# Patient Record
Sex: Male | Born: 1970
Health system: Southern US, Community
[De-identification: ages and names within clinical notes are randomized; demographics above are authoritative.]

## PROBLEM LIST (undated history)

## (undated) DIAGNOSIS — N186 End stage renal disease: Secondary | ICD-10-CM

## (undated) DIAGNOSIS — J449 Chronic obstructive pulmonary disease, unspecified: Secondary | ICD-10-CM

## (undated) DIAGNOSIS — I639 Cerebral infarction, unspecified: Secondary | ICD-10-CM

## (undated) DIAGNOSIS — F419 Anxiety disorder, unspecified: Secondary | ICD-10-CM

## (undated) DIAGNOSIS — E119 Type 2 diabetes mellitus without complications: Secondary | ICD-10-CM

## (undated) DIAGNOSIS — J189 Pneumonia, unspecified organism: Secondary | ICD-10-CM

## (undated) DIAGNOSIS — T8859XA Other complications of anesthesia, initial encounter: Secondary | ICD-10-CM

## (undated) DIAGNOSIS — I517 Cardiomegaly: Secondary | ICD-10-CM

## (undated) DIAGNOSIS — R0600 Dyspnea, unspecified: Secondary | ICD-10-CM

## (undated) DIAGNOSIS — G4730 Sleep apnea, unspecified: Secondary | ICD-10-CM

## (undated) DIAGNOSIS — N400 Enlarged prostate without lower urinary tract symptoms: Secondary | ICD-10-CM

## (undated) DIAGNOSIS — I2510 Atherosclerotic heart disease of native coronary artery without angina pectoris: Secondary | ICD-10-CM

## (undated) DIAGNOSIS — F1490 Cocaine use, unspecified, uncomplicated: Secondary | ICD-10-CM

## (undated) DIAGNOSIS — I10 Essential (primary) hypertension: Secondary | ICD-10-CM

## (undated) DIAGNOSIS — M5430 Sciatica, unspecified side: Secondary | ICD-10-CM

## (undated) DIAGNOSIS — K824 Cholesterolosis of gallbladder: Secondary | ICD-10-CM

## (undated) DIAGNOSIS — Z8739 Personal history of other diseases of the musculoskeletal system and connective tissue: Secondary | ICD-10-CM

## (undated) DIAGNOSIS — I7121 Aneurysm of the ascending aorta, without rupture: Secondary | ICD-10-CM

## (undated) DIAGNOSIS — I214 Non-ST elevation (NSTEMI) myocardial infarction: Secondary | ICD-10-CM

## (undated) DIAGNOSIS — E785 Hyperlipidemia, unspecified: Secondary | ICD-10-CM

## (undated) HISTORY — DX: End stage renal disease: N18.6

## (undated) HISTORY — DX: Benign prostatic hyperplasia without lower urinary tract symptoms: N40.0

## (undated) HISTORY — PX: NO PAST SURGERIES: SHX2092

## (undated) HISTORY — DX: Sciatica, unspecified side: M54.30

## (undated) HISTORY — DX: Personal history of other diseases of the musculoskeletal system and connective tissue: Z87.39

## (undated) HISTORY — DX: Non-ST elevation (NSTEMI) myocardial infarction: I21.4

## (undated) HISTORY — DX: Hyperlipidemia, unspecified: E78.5

## (undated) HISTORY — DX: Cocaine use, unspecified, uncomplicated: F14.90

## (undated) HISTORY — PX: CARDIAC CATHETERIZATION: SHX172

## (undated) HISTORY — DX: Cardiomegaly: I51.7

---

## 2005-08-04 ENCOUNTER — Emergency Department (HOSPITAL_COMMUNITY): Admission: EM | Admit: 2005-08-04 | Discharge: 2005-08-04 | Payer: Self-pay | Admitting: Emergency Medicine

## 2015-03-23 DIAGNOSIS — I428 Other cardiomyopathies: Secondary | ICD-10-CM | POA: Insufficient documentation

## 2016-02-15 DIAGNOSIS — I119 Hypertensive heart disease without heart failure: Secondary | ICD-10-CM | POA: Insufficient documentation

## 2016-02-15 DIAGNOSIS — I5042 Chronic combined systolic (congestive) and diastolic (congestive) heart failure: Secondary | ICD-10-CM | POA: Insufficient documentation

## 2018-01-02 ENCOUNTER — Other Ambulatory Visit: Payer: Self-pay

## 2018-01-02 ENCOUNTER — Inpatient Hospital Stay (HOSPITAL_COMMUNITY)
Admission: EM | Admit: 2018-01-02 | Discharge: 2018-01-04 | DRG: 190 | Disposition: A | Discharge status: Home / Self Care | Payer: Self-pay | Attending: Internal Medicine | Admitting: Internal Medicine

## 2018-01-02 ENCOUNTER — Encounter (HOSPITAL_COMMUNITY): Payer: Self-pay

## 2018-01-02 ENCOUNTER — Emergency Department (HOSPITAL_COMMUNITY): Payer: Self-pay

## 2018-01-02 DIAGNOSIS — R0602 Shortness of breath: Secondary | ICD-10-CM

## 2018-01-02 DIAGNOSIS — R9431 Abnormal electrocardiogram [ECG] [EKG]: Secondary | ICD-10-CM | POA: Diagnosis present

## 2018-01-02 DIAGNOSIS — I429 Cardiomyopathy, unspecified: Secondary | ICD-10-CM | POA: Diagnosis present

## 2018-01-02 DIAGNOSIS — Z7902 Long term (current) use of antithrombotics/antiplatelets: Secondary | ICD-10-CM

## 2018-01-02 DIAGNOSIS — I130 Hypertensive heart and chronic kidney disease with heart failure and stage 1 through stage 4 chronic kidney disease, or unspecified chronic kidney disease: Secondary | ICD-10-CM | POA: Diagnosis present

## 2018-01-02 DIAGNOSIS — J9600 Acute respiratory failure, unspecified whether with hypoxia or hypercapnia: Secondary | ICD-10-CM | POA: Insufficient documentation

## 2018-01-02 DIAGNOSIS — Z720 Tobacco use: Secondary | ICD-10-CM | POA: Diagnosis present

## 2018-01-02 DIAGNOSIS — Z79899 Other long term (current) drug therapy: Secondary | ICD-10-CM

## 2018-01-02 DIAGNOSIS — E1165 Type 2 diabetes mellitus with hyperglycemia: Secondary | ICD-10-CM | POA: Diagnosis present

## 2018-01-02 DIAGNOSIS — R042 Hemoptysis: Secondary | ICD-10-CM

## 2018-01-02 DIAGNOSIS — Z8249 Family history of ischemic heart disease and other diseases of the circulatory system: Secondary | ICD-10-CM

## 2018-01-02 DIAGNOSIS — Z9119 Patient's noncompliance with other medical treatment and regimen: Secondary | ICD-10-CM

## 2018-01-02 DIAGNOSIS — E785 Hyperlipidemia, unspecified: Secondary | ICD-10-CM

## 2018-01-02 DIAGNOSIS — E876 Hypokalemia: Secondary | ICD-10-CM | POA: Diagnosis not present

## 2018-01-02 DIAGNOSIS — N186 End stage renal disease: Secondary | ICD-10-CM

## 2018-01-02 DIAGNOSIS — E1169 Type 2 diabetes mellitus with other specified complication: Secondary | ICD-10-CM

## 2018-01-02 DIAGNOSIS — M79606 Pain in leg, unspecified: Secondary | ICD-10-CM | POA: Diagnosis present

## 2018-01-02 DIAGNOSIS — I10 Essential (primary) hypertension: Secondary | ICD-10-CM | POA: Insufficient documentation

## 2018-01-02 DIAGNOSIS — J209 Acute bronchitis, unspecified: Secondary | ICD-10-CM | POA: Diagnosis present

## 2018-01-02 DIAGNOSIS — I152 Hypertension secondary to endocrine disorders: Secondary | ICD-10-CM | POA: Insufficient documentation

## 2018-01-02 DIAGNOSIS — Z833 Family history of diabetes mellitus: Secondary | ICD-10-CM

## 2018-01-02 DIAGNOSIS — F17210 Nicotine dependence, cigarettes, uncomplicated: Secondary | ICD-10-CM | POA: Diagnosis present

## 2018-01-02 DIAGNOSIS — Z7984 Long term (current) use of oral hypoglycemic drugs: Secondary | ICD-10-CM

## 2018-01-02 DIAGNOSIS — E119 Type 2 diabetes mellitus without complications: Secondary | ICD-10-CM

## 2018-01-02 DIAGNOSIS — I159 Secondary hypertension, unspecified: Secondary | ICD-10-CM | POA: Diagnosis present

## 2018-01-02 DIAGNOSIS — N183 Chronic kidney disease, stage 3 unspecified: Secondary | ICD-10-CM

## 2018-01-02 DIAGNOSIS — Z992 Dependence on renal dialysis: Secondary | ICD-10-CM

## 2018-01-02 DIAGNOSIS — J9601 Acute respiratory failure with hypoxia: Secondary | ICD-10-CM | POA: Diagnosis present

## 2018-01-02 DIAGNOSIS — I272 Pulmonary hypertension, unspecified: Secondary | ICD-10-CM | POA: Diagnosis present

## 2018-01-02 DIAGNOSIS — J440 Chronic obstructive pulmonary disease with acute lower respiratory infection: Principal | ICD-10-CM | POA: Diagnosis present

## 2018-01-02 DIAGNOSIS — I160 Hypertensive urgency: Secondary | ICD-10-CM | POA: Diagnosis present

## 2018-01-02 DIAGNOSIS — R000 Tachycardia, unspecified: Secondary | ICD-10-CM | POA: Diagnosis present

## 2018-01-02 DIAGNOSIS — I5043 Acute on chronic combined systolic (congestive) and diastolic (congestive) heart failure: Secondary | ICD-10-CM | POA: Diagnosis present

## 2018-01-02 HISTORY — DX: Cholesterolosis of gallbladder: K82.4

## 2018-01-02 HISTORY — DX: Essential (primary) hypertension: I10

## 2018-01-02 HISTORY — DX: Type 2 diabetes mellitus without complications: E11.9

## 2018-01-02 LAB — COMPREHENSIVE METABOLIC PANEL
ALT: 26 U/L (ref 0–44)
AST: 23 U/L (ref 15–41)
Albumin: 3.7 g/dL (ref 3.5–5.0)
Alkaline Phosphatase: 62 U/L (ref 38–126)
Anion gap: 10 (ref 5–15)
BUN: 20 mg/dL (ref 6–20)
CO2: 27 mmol/L (ref 22–32)
Calcium: 8.6 mg/dL — ABNORMAL LOW (ref 8.9–10.3)
Chloride: 105 mmol/L (ref 98–111)
Creatinine, Ser: 1.45 mg/dL — ABNORMAL HIGH (ref 0.61–1.24)
GFR calc Af Amer: >60 mL/min (ref >60)
GFR calc non Af Amer: 56 mL/min — ABNORMAL LOW (ref >60)
Glucose, Bld: 186 mg/dL — ABNORMAL HIGH (ref 70–99)
Potassium: 3.1 mmol/L — ABNORMAL LOW (ref 3.5–5.1)
Sodium: 142 mmol/L (ref 135–145)
Total Bilirubin: 1.3 mg/dL — ABNORMAL HIGH (ref 0.3–1.2)
Total Protein: 7 g/dL (ref 6.5–8.1)

## 2018-01-02 LAB — HEMOGLOBIN A1C
Hgb A1c MFr Bld: 10.1 % — ABNORMAL HIGH (ref 4.8–5.6)
Mean Plasma Glucose: 243.17 mg/dL

## 2018-01-02 LAB — CBC WITH DIFFERENTIAL/PLATELET
Basophils Absolute: 0 10*3/uL (ref 0.0–0.1)
Basophils Relative: 0 %
Eosinophils Absolute: 0 10*3/uL (ref 0.0–0.7)
Eosinophils Relative: 1 %
HCT: 48 % (ref 39.0–52.0)
Hemoglobin: 16.4 g/dL (ref 13.0–17.0)
Lymphocytes Relative: 13 %
Lymphs Abs: 1 10*3/uL (ref 0.7–4.0)
MCH: 28.9 pg (ref 26.0–34.0)
MCHC: 34.2 g/dL (ref 30.0–36.0)
MCV: 84.5 fL (ref 78.0–100.0)
Monocytes Absolute: 0.8 10*3/uL (ref 0.1–1.0)
Monocytes Relative: 11 %
Neutro Abs: 5.8 10*3/uL (ref 1.7–7.7)
Neutrophils Relative %: 75 %
Platelets: 226 10*3/uL (ref 150–400)
RBC: 5.68 MIL/uL (ref 4.22–5.81)
RDW: 14.5 % (ref 11.5–15.5)
WBC: 7.7 10*3/uL (ref 4.0–10.5)

## 2018-01-02 LAB — I-STAT TROPONIN, ED: Troponin i, poc: 0.08 ng/mL (ref 0.00–0.08)

## 2018-01-02 LAB — TROPONIN I
Troponin I: 0.1 ng/mL (ref <0.03)
Troponin I: 0.11 ng/mL (ref <0.03)

## 2018-01-02 LAB — GLUCOSE, CAPILLARY
Glucose-Capillary: 190 mg/dL — ABNORMAL HIGH (ref 70–99)
Glucose-Capillary: 261 mg/dL — ABNORMAL HIGH (ref 70–99)

## 2018-01-02 LAB — BRAIN NATRIURETIC PEPTIDE: B Natriuretic Peptide: 184.7 pg/mL — ABNORMAL HIGH (ref 0.0–100.0)

## 2018-01-02 LAB — D-DIMER, QUANTITATIVE: D-Dimer, Quant: 0.44 ug/mL-FEU (ref 0.00–0.50)

## 2018-01-02 MED ORDER — SODIUM CHLORIDE 0.9 % IV SOLN
250.0000 mL | INTRAVENOUS | Status: DC | PRN
  Administered 2018-01-03 (×2): 250 mL via INTRAVENOUS

## 2018-01-02 MED ORDER — BENZONATATE 100 MG PO CAPS
100.0000 mg | ORAL_CAPSULE | Freq: Three times a day (TID) | ORAL | Status: DC
  Administered 2018-01-02 – 2018-01-04 (×6): 100 mg via ORAL
  Filled 2018-01-02 (×6): qty 1

## 2018-01-02 MED ORDER — IPRATROPIUM-ALBUTEROL 0.5-2.5 (3) MG/3ML IN SOLN
3.0000 mL | Freq: Once | RESPIRATORY_TRACT | Status: AC
  Administered 2018-01-02: 3 mL via RESPIRATORY_TRACT
  Filled 2018-01-02: qty 3

## 2018-01-02 MED ORDER — LOSARTAN POTASSIUM-HCTZ 50-12.5 MG PO TABS: 1.0000 | ORAL_TABLET | Freq: Every day | ORAL | Status: DC

## 2018-01-02 MED ORDER — IPRATROPIUM-ALBUTEROL 0.5-2.5 (3) MG/3ML IN SOLN
3.0000 mL | Freq: Four times a day (QID) | RESPIRATORY_TRACT | Status: DC
  Administered 2018-01-02 – 2018-01-03 (×3): 3 mL via RESPIRATORY_TRACT
  Filled 2018-01-02 (×3): qty 3

## 2018-01-02 MED ORDER — ASPIRIN 325 MG PO TABS: 325.0000 mg | ORAL_TABLET | Freq: Every day | ORAL | Status: DC

## 2018-01-02 MED ORDER — HYDRALAZINE HCL 20 MG/ML IJ SOLN
10.0000 mg | Freq: Four times a day (QID) | INTRAMUSCULAR | Status: DC | PRN
  Administered 2018-01-02 – 2018-01-04 (×5): 10 mg via INTRAVENOUS
  Filled 2018-01-02 (×5): qty 1

## 2018-01-02 MED ORDER — MORPHINE SULFATE (PF) 2 MG/ML IV SOLN
2.0000 mg | INTRAVENOUS | Status: AC | PRN
  Administered 2018-01-02 – 2018-01-03 (×2): 2 mg via INTRAVENOUS
  Filled 2018-01-02 (×2): qty 1

## 2018-01-02 MED ORDER — ASPIRIN 81 MG PO CHEW
324.0000 mg | CHEWABLE_TABLET | Freq: Once | ORAL | Status: AC
  Administered 2018-01-02: 324 mg via ORAL
  Filled 2018-01-02: qty 4

## 2018-01-02 MED ORDER — LORAZEPAM 0.5 MG PO TABS: 0.5000 mg | ORAL_TABLET | Freq: Once | ORAL | Status: DC

## 2018-01-02 MED ORDER — AMLODIPINE BESYLATE 5 MG PO TABS
10.0000 mg | ORAL_TABLET | Freq: Every day | ORAL | Status: DC
  Administered 2018-01-02: 10 mg via ORAL
  Filled 2018-01-02: qty 2

## 2018-01-02 MED ORDER — SODIUM CHLORIDE 0.9% FLUSH
3.0000 mL | Freq: Two times a day (BID) | INTRAVENOUS | Status: DC
  Administered 2018-01-02 – 2018-01-03 (×3): 3 mL via INTRAVENOUS

## 2018-01-02 MED ORDER — METOPROLOL TARTRATE 25 MG PO TABS
25.0000 mg | ORAL_TABLET | Freq: Two times a day (BID) | ORAL | Status: DC
  Administered 2018-01-02: 25 mg via ORAL
  Filled 2018-01-02: qty 1

## 2018-01-02 MED ORDER — INSULIN ASPART 100 UNIT/ML ~~LOC~~ SOLN
0.0000 [IU] | Freq: Three times a day (TID) | SUBCUTANEOUS | Status: DC
  Administered 2018-01-02: 2 [IU] via SUBCUTANEOUS
  Administered 2018-01-03: 7 [IU] via SUBCUTANEOUS

## 2018-01-02 MED ORDER — SODIUM CHLORIDE 0.9% FLUSH: 3.0000 mL | INTRAVENOUS | Status: DC | PRN

## 2018-01-02 MED ORDER — HYDROCOD POLST-CPM POLST ER 10-8 MG/5ML PO SUER
5.0000 mL | Freq: Two times a day (BID) | ORAL | Status: DC
  Administered 2018-01-02 – 2018-01-04 (×4): 5 mL via ORAL
  Filled 2018-01-02 (×5): qty 5

## 2018-01-02 MED ORDER — FUROSEMIDE 10 MG/ML IJ SOLN
40.0000 mg | Freq: Once | INTRAMUSCULAR | Status: AC
  Administered 2018-01-02: 40 mg via INTRAVENOUS
  Filled 2018-01-02: qty 4

## 2018-01-02 MED ORDER — HYDRALAZINE HCL 50 MG PO TABS
50.0000 mg | ORAL_TABLET | Freq: Three times a day (TID) | ORAL | Status: DC
  Administered 2018-01-02 – 2018-01-03 (×4): 50 mg via ORAL
  Filled 2018-01-02 (×5): qty 1

## 2018-01-02 MED ORDER — ATORVASTATIN CALCIUM 40 MG PO TABS
40.0000 mg | ORAL_TABLET | Freq: Every day | ORAL | Status: DC
  Administered 2018-01-02 – 2018-01-04 (×3): 40 mg via ORAL
  Filled 2018-01-02 (×3): qty 1

## 2018-01-02 MED ORDER — SPIRONOLACTONE 25 MG PO TABS
25.0000 mg | ORAL_TABLET | Freq: Every day | ORAL | Status: DC
  Administered 2018-01-02: 25 mg via ORAL
  Filled 2018-01-02: qty 1

## 2018-01-02 MED ORDER — NICOTINE 21 MG/24HR TD PT24
21.0000 mg | MEDICATED_PATCH | Freq: Every day | TRANSDERMAL | Status: DC
  Administered 2018-01-02 – 2018-01-04 (×3): 21 mg via TRANSDERMAL
  Filled 2018-01-02 (×3): qty 1

## 2018-01-02 MED ORDER — HYDRALAZINE HCL 50 MG PO TABS
50.0000 mg | ORAL_TABLET | Freq: Three times a day (TID) | ORAL | Status: DC
  Administered 2018-01-02: 50 mg via ORAL
  Filled 2018-01-02: qty 1

## 2018-01-02 MED ORDER — CARVEDILOL 25 MG PO TABS
25.0000 mg | ORAL_TABLET | Freq: Two times a day (BID) | ORAL | Status: DC
  Filled 2018-01-02: qty 1

## 2018-01-02 MED ORDER — LISINOPRIL 20 MG PO TABS
20.0000 mg | ORAL_TABLET | Freq: Every day | ORAL | Status: DC
  Administered 2018-01-02: 20 mg via ORAL
  Filled 2018-01-02: qty 1

## 2018-01-02 MED ORDER — CLONIDINE HCL 0.1 MG PO TABS
0.1000 mg | ORAL_TABLET | Freq: Once | ORAL | Status: AC | PRN
  Administered 2018-01-02: 0.1 mg via ORAL
  Filled 2018-01-02: qty 1

## 2018-01-02 MED ORDER — FUROSEMIDE 10 MG/ML IJ SOLN
40.0000 mg | Freq: Two times a day (BID) | INTRAMUSCULAR | Status: DC
  Administered 2018-01-02 – 2018-01-03 (×2): 40 mg via INTRAVENOUS
  Filled 2018-01-02 (×2): qty 4

## 2018-01-02 MED ORDER — ACETAMINOPHEN 325 MG PO TABS
650.0000 mg | ORAL_TABLET | ORAL | Status: DC | PRN
  Administered 2018-01-02 – 2018-01-03 (×3): 650 mg via ORAL
  Filled 2018-01-02 (×3): qty 2

## 2018-01-02 MED ORDER — HYDRALAZINE HCL 20 MG/ML IJ SOLN
10.0000 mg | Freq: Once | INTRAMUSCULAR | Status: AC
  Administered 2018-01-02: 10 mg via INTRAVENOUS
  Filled 2018-01-02: qty 1

## 2018-01-02 MED ORDER — ONDANSETRON HCL 4 MG/2ML IJ SOLN: 4.0000 mg | Freq: Four times a day (QID) | INTRAMUSCULAR | Status: DC | PRN

## 2018-01-02 MED ORDER — LABETALOL HCL 5 MG/ML IV SOLN
10.0000 mg | Freq: Once | INTRAVENOUS | Status: AC
  Administered 2018-01-02: 10 mg via INTRAVENOUS
  Filled 2018-01-02: qty 4

## 2018-01-02 MED ORDER — LORAZEPAM 1 MG PO TABS
1.0000 mg | ORAL_TABLET | Freq: Once | ORAL | Status: AC
  Administered 2018-01-02: 1 mg via ORAL
  Filled 2018-01-02: qty 1

## 2018-01-02 MED ORDER — HEPARIN SODIUM (PORCINE) 5000 UNIT/ML IJ SOLN
5000.0000 [IU] | Freq: Three times a day (TID) | INTRAMUSCULAR | Status: DC
  Administered 2018-01-02 – 2018-01-04 (×6): 5000 [IU] via SUBCUTANEOUS
  Filled 2018-01-02 (×6): qty 1

## 2018-01-02 MED ORDER — INSULIN ASPART 100 UNIT/ML ~~LOC~~ SOLN
0.0000 [IU] | Freq: Every day | SUBCUTANEOUS | Status: DC
  Administered 2018-01-02: 3 [IU] via SUBCUTANEOUS

## 2018-01-02 NOTE — H&P (Addendum)
History and Physical        Hospital Admission Note Date: 01/02/2018  Patient name: Matthew Odom Medical record number: [id number] Date of birth: 13-Oct-1970 Age: 47 y.o. Gender: male  PCP: Patient, No Pcp Per    Patient coming from: Home  I have reviewed all records in the [facility].    Chief Complaint:  Shortness of breath with orthopnea, coughing, worse since yesterday  HPI: Patient is a 47 year old male with hypertension, diabetes, hyperlipidemia, active smoker, noncompliant, presented to ED with above complaints, with worsening since yesterday.  Patient reported that yesterday evening he developed significant shortness of breath with coughing, no phlegm, fevers or chills.  He felt left-sided heaviness on coughing, was not able to lay flat due to shortness of breath and was not able to sleep last night.  He also reports pain in the buttock area radiating down to bilateral posterior legs for the last 2 weeks, has to rest and improves with squatting.  Currently no chest pain.  Patient reports that he has not taken any of his medications in the last week. Patient stated that he was taking ibuprofen daily for the leg pains.  This morning when he woke up around 10 AM, he had coughing fits and pink frothy sputum.  He has noted some abdominal distention and last month and off-and-on leg swelling.  ED work-up/course:  Temp 98.8, respiratory rate 16, pulse 98, the time of triage, blood pressure was 213/134. BMET showed sodium 142, potassium 3.1, BUN 20, creatinine 1.45  Review of Systems: Positives marked in 'bold' Constitutional: Denies fever, chills, diaphoresis, poor appetite and fatigue.  HEENT: Denies photophobia, eye pain, redness, hearing loss, ear pain, congestion, sore throat, rhinorrhea, sneezing, mouth sores, trouble swallowing, neck pain, neck stiffness and tinnitus.    Respiratory: Please see HPI Cardiovascular: Please see HPI Gastrointestinal: Denies nausea, vomiting, abdominal pain, diarrhea, constipation, blood in stool. + abdominal distention.  Genitourinary: Denies dysuria, urgency, frequency, hematuria, flank pain and difficulty urinating.  Musculoskeletal: Denies myalgias, back pain, joint swelling, arthralgias and gait problem.  Skin: Denies pallor, rash and wound.  Neurological: Denies dizziness, seizures, syncope, weakness, light-headedness, numbness and headaches.  Hematological: Denies adenopathy. Easy bruising, personal or family bleeding history  Psychiatric/Behavioral: Denies suicidal ideation, mood changes, confusion, nervousness, sleep disturbance and agitation  Past Medical History: Past Medical History:  Diagnosis Date  . Cholesterosis   . Diabetes mellitus without complication ([place])   . Hypertension     History reviewed. No pertinent surgical history.  Medications: Prior to Admission medications   Medication Sig Start Date End Date Taking? Authorizing Provider  atorvastatin (LIPITOR) 40 MG tablet Take 40 mg by mouth daily.   Yes [provider]  carvedilol (COREG) 25 MG tablet Take 25 mg by mouth 2 (two) times daily with a meal.   Yes [provider]  diphenhydramine-acetaminophen (TYLENOL PM) 25-500 MG TABS tablet Take 1 tablet by mouth at bedtime as needed (pain).   Yes [provider]  furosemide (LASIX) 20 MG tablet Take 20 mg by mouth daily.   Yes [provider]  hydrALAZINE (APRESOLINE) 50 MG tablet Take 50 mg by mouth 3 (three)  times daily.   Yes [provider]  ibuprofen (ADVIL,MOTRIN) 200 MG tablet Take 400 mg by mouth every 6 (six) hours as needed for moderate pain.   Yes [provider]  lisinopril (PRINIVIL,ZESTRIL) 20 MG tablet Take 20 mg by mouth daily.   Yes [provider]  metFORMIN (GLUCOPHAGE) 500 MG tablet Take 500 mg by mouth 2 (two) times daily  with a meal.    Yes [provider]  spironolactone (ALDACTONE) 25 MG tablet Take 25 mg by mouth daily.   Yes [provider]  atorvastatin (LIPITOR) 20 MG tablet Take 80 mg by mouth daily.    [provider]    Allergies:  No Known Allergies  Social History:  reports that he has been smoking cigarettes. He has been smoking about 1.00 pack per day since age of 20. He has never used smokeless tobacco. He reports that he drinks alcohol. He reports that he does not use drugs.  Family History: States his parents had diabetes mellitus and his sister had enlarged heart and died of heart issues  Physical Exam: Blood pressure (!) 169/112, pulse 74, temperature 98.8 F (37.1 C), temperature source Oral, resp. rate 12, height 5\' 11"  (1.803 m), weight 108.9 kg, SpO2 100 %. General: Alert, awake, oriented x3, in no acute distress.  Sitting propped up in bed. Eyes: pink conjunctiva,anicteric sclera, pupils equal and reactive to light and accomodation, HEENT: normocephalic, atraumatic, oropharynx clear Neck: supple, no masses or lymphadenopathy, no goiter, no bruits, no JVD CVS: Regular rate and rhythm, without murmurs, rubs or gallops.  Trace lower extremity edema Resp : Decreased breath sound at the bases with mild scattered wheezing GI : Soft, nontender, mildly distended positive bowel sounds, no masses. No hepatomegaly. No hernia.  Musculoskeletal: No clubbing or cyanosis, positive pedal pulses. No contracture. ROM intact  Neuro: Grossly intact, no focal neurological deficits, strength 5/5 upper and lower extremities bilaterally Psych: alert and oriented x 3, normal mood and affect Skin: no rashes or lesions, warm and dry   LABS on Admission: I have personally reviewed all the labs and imagings below    Basic Metabolic Panel: Recent Labs  Lab 01/02/18 1248  NA 142  K 3.1*  CL 105  CO2 27  GLUCOSE 186*  BUN 20  CREATININE 1.45*  CALCIUM 8.6*   Liver  Function Tests: Recent Labs  Lab 01/02/18 1248  AST 23  ALT 26  ALKPHOS 62  BILITOT 1.3*  PROT 7.0  ALBUMIN 3.7   No results for input(s): LIPASE, AMYLASE in the last 168 hours. No results for input(s): AMMONIA in the last 168 hours. CBC: Recent Labs  Lab 01/02/18 1248  WBC 7.7  NEUTROABS 5.8  HGB 16.4  HCT 48.0  MCV 84.5  PLT 226   Cardiac Enzymes: No results for input(s): CKTOTAL, CKMB, CKMBINDEX, TROPONINI in the last 168 hours. BNP: Invalid input(s): POCBNP CBG: No results for input(s): GLUCAP in the last 168 hours.  Radiological Exams on Admission:  Dg Chest 2 View  Result Date: 01/02/2018 CLINICAL DATA:  Acute onset productive cough and shortness of breath. Current history of diabetes and hypertension. Current smoker. EXAM: CHEST - 2 VIEW COMPARISON:  None. FINDINGS: Cardiac silhouette markedly enlarged. Hilar and mediastinal contours otherwise unremarkable. Mild pulmonary venous hypertension without overt edema. Lungs clear. Bronchovascular markings normal. No visible pleural effusions. No pneumothorax. Visualized bony thorax intact. IMPRESSION: Marked cardiomegaly. Pulmonary venous hypertension without overt edema. No acute cardiopulmonary disease. Electronically Signed  By: Evangeline Dakin M.D.   On: 01/02/2018 13:14      EKG: Independently reviewed 60, normal sinus rhythm, prolonged QTC 507, lateral T wave inversions with repolarization changes.  Assessment/Plan Principal Problem:   Acute respiratory failure (HCC) with dyspnea, orthopnea, coughing facility due to acute CHF and acute bronchitis -Likely due to acute CHF, possibly has underlying COPD due to long-standing smoking history, high risk for ischemic cardiomyopathy due to hypertension, diabetes, uncontrolled and noncompliance -Troponin x1-, BNP 184.7, chest x-ray showed marked cardiomegaly with pulmonary venous hypertension.  D-dimer negative -Placed on strict I's and O's and daily weights, Lasix IV  for diuresis, may have underlying diastolic CHF, obtain 2D echocardiogram for further work-up. -EKG shows LVH with repolarization changes and T wave inversions, will likely need ischemic work-up, consulted cardiology, discussed with Dr. Domenic Polite  Active Problems:   Diabetes mellitus type II, non-insulin-dependent, uncontrolled with hyperglycemia ([place]) -Placed on sliding scale insulin, obtain hemoglobin A1c   Hyperlipidemia -Obtain lipid panel, continue statin    Hypertensive urgency -Patient has not taken his antihypertensives in the last 1 week. -Continue IV Lasix for diuresis, continue oral hydralazine.  Hold ACE inhibitor, beta-blocker due to acute CHF and renal insufficiency -Patient will benefit from med assistance and PCP, case management consult    Abnormal EKG  -Obtain serial cardiac enzymes, 2D echocardiogram.  Currently no chest pain.  No prior EKG to compare with - will consult cardiology    COPD with acute bronchitis ([place]), active nicotine abuse -Patient counseled strongly on nicotine cessation, placed on nicotine patch -Continue duo nebs, no significant wheezing at the time of my exam    Leg pain bilateral possibly due to PVD -Obtain ABIs, further work-up pending imaging results -Placed on full dose aspirin  DVT prophylaxis: Heparin subcu  CODE STATUS: Full CODE STATUS  Consults called: Cardiology Family Communication: Admission, patients condition and plan of care including tests being ordered have been discussed with the patient  who indicates understanding and agree with the plan and Code Status  Admission status: Inpatient, telemetry  Disposition plan: Further plan will depend as patient's clinical course evolves and further radiologic and laboratory data become available.    At the time of admission, it appears that the appropriate admission status for this patient is INPATIENT . This is judged to be reasonable and necessary in order to provide the required  intensity of service to ensure the patient's safety given the presenting symptoms acute dyspnea, cardiomegaly, acute CHF, physical exam findings, and initial radiographic and laboratory data in the context of their chronic comorbidities.  The medical decision making on this patient was of high complexity and the patient is at high risk for clinical deterioration, therefore this is a level 3 visit.   Time Spent on Admission: 65 minutes    Amaree Leeper M.D. [facility] 01/02/2018, 2:52 PM Pager: [phone]  If 7PM-7AM, please contact night-coverage [email or web address] Password [email or web address]

## 2018-01-02 NOTE — ED Triage Notes (Signed)
Pt states several weeks ago he started having bilateral leg pain, going up to his hips,espcially the right. Yesterday at 1100, pt states he went into a "coughing frenzy" and has been coughing since. Pt states he had pink sputum today.

## 2018-01-02 NOTE — Progress Notes (Addendum)
CRITICAL VALUE ALERT  Critical Value:  Troponin 0.10  Date & Time Notied:  01/02/18 @ 1701  Provider Notified: MD Rai  Orders Received/Actions taken: No new orders. Per MD, Cardiology has been consulted. Pt denies CP or chest tightness, no signs of distress.

## 2018-01-02 NOTE — ED Notes (Signed)
I have just called report to [place], RN on [address]. Will transport now.

## 2018-01-02 NOTE — Plan of Care (Signed)

## 2018-01-02 NOTE — ED Provider Notes (Signed)
[facility] DEPT Provider Note   CSN: [id number] Arrival date & time: 01/02/18  1140     History   Chief Complaint Chief Complaint  Patient presents with  . Hemoptysis  . Leg Pain    HPI Matthew Odom is a 47 y.o. male.  HPI Matthew Odom is a 47 y.o. male with history of hypertension, diabetes, enlarged heart, presents to emergency department with complaint of bilateral posterior leg pain, cough, chest pain, shortness of breath.  Patient states that he has had pain in his buttock area radiating down bilateral posterior legs over the last several days.  He denies any injuries.  Pain is described as achy.  He denies any swelling in his legs.  He states that yesterday he has developed severe cough.  He states when he starts coughing he is unable to stop.  He states he did not get much sleep last night.  He reports that he noticed that his sputum was pink in color.  He denies any fever or chills.  He is admits to shortness of breath that is worse with coughing attacks and on exertion.  He states that he is not having much orthopnea although he is more comfortable sitting upright.  He has not been able to use his inhaler because he is so short of breath.  He also has not taking his blood pressure medications in the last 2 days.  Past Medical History:  Diagnosis Date  . Cholesterosis   . Diabetes mellitus without complication ([place])   . Hypertension     There are no active problems to display for this patient.   History reviewed. No pertinent surgical history.      Home Medications    Prior to Admission medications   Medication Sig Start Date End Date Taking? Authorizing Provider  amLODipine (NORVASC) 10 MG tablet Take 10 mg by mouth daily.   Yes [provider]  atorvastatin (LIPITOR) 20 MG tablet Take 80 mg by mouth daily.   Yes [provider]  clopidogrel (PLAVIX) 75 MG tablet Take 75 mg by mouth daily.   Yes [provider]  diphenhydramine-acetaminophen (TYLENOL PM) 25-500 MG TABS tablet Take 1 tablet by mouth at bedtime as needed (pain).   Yes [provider]  ibuprofen (ADVIL,MOTRIN) 200 MG tablet Take 400 mg by mouth every 6 (six) hours as needed for moderate pain.   Yes [provider]  losartan (COZAAR) 50 MG tablet Take 50 mg by mouth daily.   Yes [provider]  losartan-hydrochlorothiazide (HYZAAR) 50-12.5 MG tablet Take 1 tablet by mouth daily.   Yes [provider]  metFORMIN (GLUCOPHAGE) 500 MG tablet Take 500 mg by mouth daily with breakfast.   Yes [provider]  metoprolol tartrate (LOPRESSOR) 25 MG tablet Take 25 mg by mouth 2 (two) times daily.   Yes [provider]    Family History No family history on file.  Social History Social History   Tobacco Use  . Smoking status: Current Every Day Smoker    Packs/day: 1.00    Types: Cigarettes  . Smokeless tobacco: Never Used  Substance Use Topics  . Alcohol use: Yes    Comment: occasionally  . Drug use: Never     Allergies   Patient has no known allergies.   Review of Systems Review of Systems  Constitutional: Negative for chills and fever.  HENT: Negative for congestion.   Respiratory: Positive for cough, chest tightness and shortness  of breath.   Cardiovascular: Negative for chest pain, palpitations and leg swelling.  Gastrointestinal: Negative for abdominal distention, abdominal pain, diarrhea, nausea and vomiting.  Genitourinary: Negative for dysuria, frequency, hematuria and urgency.  Musculoskeletal: Positive for arthralgias, back pain and myalgias. Negative for neck pain and neck stiffness.  Skin: Negative for rash.  Allergic/Immunologic: Negative for immunocompromised state.  Neurological: Positive for headaches. Negative for dizziness, weakness, light-headedness and numbness.  All other systems reviewed and are negative.    Physical Exam Updated Vital Signs BP  (!) 213/134 (BP Location: Left Arm)   Pulse 98   Temp 98.8 F (37.1 C) (Oral)   Resp 16   Ht 5\' 11"  (1.803 m)   Wt 108.9 kg   SpO2 96%   BMI 33.47 kg/m   Physical Exam  Constitutional: He is oriented to person, place, and time. He appears well-developed and well-nourished. No distress.  HENT:  Head: Normocephalic and atraumatic.  Eyes: Conjunctivae are normal.  Neck: Neck supple.  Cardiovascular: Normal rate, regular rhythm and normal heart sounds.  Pulmonary/Chest: Effort normal. No respiratory distress. He has wheezes. He has no rales.  Coughing, inspiratory and expiratory wheezes bilaterally  Abdominal: Soft. Bowel sounds are normal. He exhibits no distension. There is no tenderness. There is no rebound.  Musculoskeletal: He exhibits no edema.  Neurological: He is alert and oriented to person, place, and time.  Skin: Skin is warm and dry.  Nursing note and vitals reviewed.    ED Treatments / Results  Labs (all labs ordered are listed, but only abnormal results are displayed) Labs Reviewed  COMPREHENSIVE METABOLIC PANEL - Abnormal; Notable for the following components:      Result Value   Potassium 3.1 (*)    Glucose, Bld 186 (*)    Creatinine, Ser 1.45 (*)    Calcium 8.6 (*)    Total Bilirubin 1.3 (*)    GFR calc non Af Amer 56 (*)    All other components within normal limits  BRAIN NATRIURETIC PEPTIDE - Abnormal; Notable for the following components:   B Natriuretic Peptide 184.7 (*)    All other components within normal limits  CBC WITH DIFFERENTIAL/PLATELET  D-DIMER, QUANTITATIVE (NOT AT [facility])  I-STAT TROPONIN, ED    EKG EKG Interpretation  Date/Time:  Saturday January 02 2018 12:37:46 EDT Ventricular Rate:  83 PR Interval:    QRS Duration: 123 QT Interval:  431 QTC Calculation: 507 R Axis:   -5 Text Interpretation:  Sinus rhythm Atrial premature complexes Probable left atrial enlargement LVH with secondary repolarization abnormality Anterior infarct,  acute (LAD) Prolonged QT interval Confirmed by Lacretia Leigh ([id number]) on 01/02/2018 1:30:21 PM   Radiology Dg Chest 2 View  Result Date: 01/02/2018 CLINICAL DATA:  Acute onset productive cough and shortness of breath. Current history of diabetes and hypertension. Current smoker. EXAM: CHEST - 2 VIEW COMPARISON:  None. FINDINGS: Cardiac silhouette markedly enlarged. Hilar and mediastinal contours otherwise unremarkable. Mild pulmonary venous hypertension without overt edema. Lungs clear. Bronchovascular markings normal. No visible pleural effusions. No pneumothorax. Visualized bony thorax intact. IMPRESSION: Marked cardiomegaly. Pulmonary venous hypertension without overt edema. No acute cardiopulmonary disease. Electronically Signed   By: Evangeline Dakin M.D.   On: 01/02/2018 13:14    Procedures Procedures (including critical care time)  Medications Ordered in ED Medications  ipratropium-albuterol (DUONEB) 0.5-2.5 (3) MG/3ML nebulizer solution 3 mL (has no administration in time range)     Initial Impression / Assessment and Plan / ED Course  I have reviewed the triage vital signs and the nursing notes.  Pertinent labs & imaging results that were available during my care of the patient were reviewed by me and considered in my medical decision making (see chart for details).    Patient with elevated blood pressure, cough, shortness of breath, intermittent chest tightness.  Patient is actively coughing.  He is got pink frothy sputum that he is spitting into a bag.  Will get EKG, chest x-ray, d-dimer and BNP as well as basic labs and troponin.  I will administer breathing treatment.  He is wheezing.  He appears to be very uncomfortable.  Blood pressure very high, has not taking his medications in about 2 days, states he ran out.  Currently no PCP.  Patient's chest x-ray shows cardiomegaly with pulmonary hypertension, no edema.  Patient feels slightly improved with breathing treatment but  states he feels very anxious.  Possibly from albuterol.  I will give him some Ativan.  He is EKG shows signs of ischemia, troponin is negative at this time, in setting of elevated blood pressure, and slightly high creatinine, concerning for endorgan damage and hypertensive urgency.  I initially ordered him his regular medications that he listed me, however pharmacy was able to verify his medications and they have changed.  His BNP is still pending at this time.  I will give him some Lasix.  He does not have any peripheral edema on exam.  I spoke with hospitalist, they will admit patient for further evaluation and observation.  Patient will need blood pressure management, possibly echo for evaluation of acute CHF.  As by hospitalist to add 10 mg of hydralazine IV.  Last blood pressure is 181/101.  Heart rate is normal.  Oxygen saturation is normal.  Respiratory rate is 25, he is afebrile.  Feeling better.  Vitals:   01/02/18 1405 01/02/18 1415 01/02/18 1430 01/02/18 1445  BP: (!) 183/106 (!) 180/117 (!) 169/112 (!) 181/101  Pulse: 68 79 74 84  Resp: 16 (!) 23 12 (!) 25  Temp:      TempSrc:      SpO2: 94% 98% 100% 96%  Weight:      Height:         Final Clinical Impressions(s) / ED Diagnoses   Final diagnoses:  Shortness of breath  Hemoptysis  Secondary hypertension    ED Discharge Orders    None       Jeannett Senior, PA-C 01/02/18 1514    Lacretia Leigh, MD 01/03/18 1542

## 2018-01-02 NOTE — Progress Notes (Signed)
During report at shift change (1912), patient c/o sudden chest pain rated a 10 out of 10, described as "tightness, pressure." Pt c/o pain to left side of chest. Said he hurt like this once before. Pt stated the pain went away after a few minutes, then came back. Stated the pain level came down to a 7 out of 10. VS taken- BP elevated to 182/101, HR 90. EKG obtained- see results. On-call [place] notified. Pt drifts off to sleep quickly- PO Ativan given in ED and pt states he did not sleep last night. No c/o nausea or SOB, no diaphoresis. O2 Sats stable. No signs of distress. Oncoming RN aware and continues to assess patient.

## 2018-01-02 NOTE — ED Notes (Signed)
ED TO INPATIENT HANDOFF REPORT  Name/Age/Gender Matthew Odom 47 y.o. male  Code Status   Home/SNF/Other Home  Chief Complaint bilateral leg pain/cough with blood  Level of Care/Admitting Diagnosis ED Disposition    ED Disposition Condition Comment   Admit  Hospital Area: [facility] [[id number]]  Level of Care: Telemetry [5]  Admit to tele based on following criteria: Acute CHF  Diagnosis: Acute respiratory failure with hypoxia [facility]) [[id number]]  Admitting Physician: RAI, Vernelle Emerald [[id number]]  Attending Physician: RAI, RIPUDEEP K [[id number]]  Estimated length of stay: past midnight tomorrow  Certification:: I certify this patient will need inpatient services for at least 2 midnights  PT Class (Do Not Modify): Inpatient [101]  PT Acc Code (Do Not Modify): Private [1]       Medical History Past Medical History:  Diagnosis Date  . Cholesterosis   . Diabetes mellitus without complication ([place])   . Hypertension     Allergies No Known Allergies  IV Location/Drains/Wounds Patient Lines/Drains/Airways Status   Active Line/Drains/Airways    None          Labs/Imaging Results for orders placed or performed during the hospital encounter of 01/02/18 (from the past 48 hour(s))  CBC with Differential     Status: None   Collection Time: 01/02/18 12:48 PM  Result Value Ref Range   WBC 7.7 4.0 - 10.5 K/uL   RBC 5.68 4.22 - 5.81 MIL/uL   Hemoglobin 16.4 13.0 - 17.0 g/dL   HCT 48.0 39.0 - 52.0 %   MCV 84.5 78.0 - 100.0 fL   MCH 28.9 26.0 - 34.0 pg   MCHC 34.2 30.0 - 36.0 g/dL   RDW 14.5 11.5 - 15.5 %   Platelets 226 150 - 400 K/uL   Neutrophils Relative % 75 %   Neutro Abs 5.8 1.7 - 7.7 K/uL   Lymphocytes Relative 13 %   Lymphs Abs 1.0 0.7 - 4.0 K/uL   Monocytes Relative 11 %   Monocytes Absolute 0.8 0.1 - 1.0 K/uL   Eosinophils Relative 1 %   Eosinophils Absolute 0.0 0.0 - 0.7 K/uL   Basophils Relative 0 %   Basophils Absolute 0.0 0.0 - 0.1 K/uL     Comment: Performed at [facility], [place] [address], [place], [place] [address]  Comprehensive metabolic panel     Status: Abnormal   Collection Time: 01/02/18 12:48 PM  Result Value Ref Range   Sodium 142 135 - 145 mmol/L   Potassium 3.1 (L) 3.5 - 5.1 mmol/L   Chloride 105 98 - 111 mmol/L   CO2 27 22 - 32 mmol/L   Glucose, Bld 186 (H) 70 - 99 mg/dL   BUN 20 6 - 20 mg/dL   Creatinine, Ser 1.45 (H) 0.61 - 1.24 mg/dL   Calcium 8.6 (L) 8.9 - 10.3 mg/dL   Total Protein 7.0 6.5 - 8.1 g/dL   Albumin 3.7 3.5 - 5.0 g/dL   AST 23 15 - 41 U/L   ALT 26 0 - 44 U/L   Alkaline Phosphatase 62 38 - 126 U/L   Total Bilirubin 1.3 (H) 0.3 - 1.2 mg/dL   GFR calc non Af Amer 56 (L) >60 mL/min   GFR calc Af Amer >60 >60 mL/min    Comment: (NOTE) The eGFR has been calculated using the CKD EPI equation. This calculation has not been validated in all clinical situations. eGFR's persistently <60 mL/min signify possible Chronic Kidney Disease.    Anion gap  10 5 - 15    Comment: Performed at [facility], [place] [address] [place], [place] [address]  Brain natriuretic peptide     Status: Abnormal   Collection Time: 01/02/18 12:48 PM  Result Value Ref Range   B Natriuretic Peptide 184.7 (H) 0.0 - 100.0 pg/mL    Comment: Performed at [facility], [place] [address], [place], [place] [address]  I-Stat Troponin, ED (not at [facility])     Status: None   Collection Time: 01/02/18 12:48 PM  Result Value Ref Range   Troponin i, poc 0.08 0.00 - 0.08 ng/mL   Comment 3            Comment: Due to the release kinetics of cTnI, a negative result within the first hours of the onset of symptoms does not rule out myocardial infarction with certainty. If myocardial infarction is still suspected, repeat the test at appropriate intervals.   D-dimer, quantitative (not at [facility])     Status: None   Collection Time: 01/02/18 12:48 PM  Result Value Ref Range   D-Dimer, Quant  0.44 0.00 - 0.50 ug/mL-FEU    Comment: (NOTE) At the manufacturer cut-off of 0.50 ug/mL FEU, this assay has been documented to exclude PE with a sensitivity and negative predictive value of 97 to 99%.  At this time, this assay has not been approved by the FDA to exclude DVT/VTE. Results should be correlated with clinical presentation. Performed at [facility], [place] [address], [place], [place] [address]    Dg Chest 2 View  Result Date: 01/02/2018 CLINICAL DATA:  Acute onset productive cough and shortness of breath. Current history of diabetes and hypertension. Current smoker. EXAM: CHEST - 2 VIEW COMPARISON:  None. FINDINGS: Cardiac silhouette markedly enlarged. Hilar and mediastinal contours otherwise unremarkable. Mild pulmonary venous hypertension without overt edema. Lungs clear. Bronchovascular markings normal. No visible pleural effusions. No pneumothorax. Visualized bony thorax intact. IMPRESSION: Marked cardiomegaly. Pulmonary venous hypertension without overt edema. No acute cardiopulmonary disease. Electronically Signed   By: Evangeline Dakin M.D.   On: 01/02/2018 13:14    Pending Labs Unresulted Labs (From admission, onward)   None      Vitals/Pain Today's Vitals   01/02/18 1148 01/02/18 1150 01/02/18 1259 01/02/18 1405  BP:  (!) 213/134 (!) 206/137 (!) 183/106  Pulse:  98 94 68  Resp:  '16 16 16  ' Temp:  98.8 F (37.1 C)    TempSrc:  Oral    SpO2:  96% 95% 94%  Weight: 108.9 kg     Height: '5\' 11"'  (1.803 m)     PainSc: 10-Worst pain ever       Isolation Precautions No active isolations  Medications Medications  hydrALAZINE (APRESOLINE) injection 10 mg (has no administration in time range)  LORazepam (ATIVAN) tablet 1 mg (has no administration in time range)  ipratropium-albuterol (DUONEB) 0.5-2.5 (3) MG/3ML nebulizer solution 3 mL (3 mLs Nebulization Given 01/02/18 1242)  aspirin chewable tablet 324 mg (324 mg Oral Given 01/02/18 1256)   furosemide (LASIX) injection 40 mg (40 mg Intravenous Given 01/02/18 1407)  ipratropium-albuterol (DUONEB) 0.5-2.5 (3) MG/3ML nebulizer solution 3 mL (3 mLs Nebulization Given 01/02/18 1427)    Mobility walks

## 2018-01-03 ENCOUNTER — Inpatient Hospital Stay (HOSPITAL_COMMUNITY): Payer: Self-pay

## 2018-01-03 DIAGNOSIS — I340 Nonrheumatic mitral (valve) insufficiency: Secondary | ICD-10-CM

## 2018-01-03 DIAGNOSIS — I5041 Acute combined systolic (congestive) and diastolic (congestive) heart failure: Secondary | ICD-10-CM

## 2018-01-03 DIAGNOSIS — I159 Secondary hypertension, unspecified: Secondary | ICD-10-CM

## 2018-01-03 DIAGNOSIS — J209 Acute bronchitis, unspecified: Secondary | ICD-10-CM

## 2018-01-03 DIAGNOSIS — I160 Hypertensive urgency: Secondary | ICD-10-CM

## 2018-01-03 DIAGNOSIS — J9601 Acute respiratory failure with hypoxia: Secondary | ICD-10-CM

## 2018-01-03 DIAGNOSIS — N186 End stage renal disease: Secondary | ICD-10-CM

## 2018-01-03 DIAGNOSIS — J440 Chronic obstructive pulmonary disease with acute lower respiratory infection: Principal | ICD-10-CM

## 2018-01-03 DIAGNOSIS — N183 Chronic kidney disease, stage 3 unspecified: Secondary | ICD-10-CM

## 2018-01-03 DIAGNOSIS — M79609 Pain in unspecified limb: Secondary | ICD-10-CM

## 2018-01-03 DIAGNOSIS — R9431 Abnormal electrocardiogram [ECG] [EKG]: Secondary | ICD-10-CM

## 2018-01-03 DIAGNOSIS — Z992 Dependence on renal dialysis: Secondary | ICD-10-CM

## 2018-01-03 DIAGNOSIS — R748 Abnormal levels of other serum enzymes: Secondary | ICD-10-CM

## 2018-01-03 LAB — LIPID PANEL
Cholesterol: 215 mg/dL — ABNORMAL HIGH (ref 0–200)
HDL: 35 mg/dL — ABNORMAL LOW (ref >40)
LDL Cholesterol: 146 mg/dL — ABNORMAL HIGH (ref 0–99)
Total CHOL/HDL Ratio: 6.1 RATIO
Triglycerides: 168 mg/dL — ABNORMAL HIGH (ref <150)
VLDL: 34 mg/dL (ref 0–40)

## 2018-01-03 LAB — BASIC METABOLIC PANEL
Anion gap: 14 (ref 5–15)
Anion gap: 10 (ref 5–15)
BUN: 23 mg/dL — ABNORMAL HIGH (ref 6–20)
BUN: 23 mg/dL — ABNORMAL HIGH (ref 6–20)
CO2: 25 mmol/L (ref 22–32)
CO2: 28 mmol/L (ref 22–32)
Calcium: 8.2 mg/dL — ABNORMAL LOW (ref 8.9–10.3)
Calcium: 8.8 mg/dL — ABNORMAL LOW (ref 8.9–10.3)
Chloride: 100 mmol/L (ref 98–111)
Chloride: 101 mmol/L (ref 98–111)
Creatinine, Ser: 1.59 mg/dL — ABNORMAL HIGH (ref 0.61–1.24)
Creatinine, Ser: 1.86 mg/dL — ABNORMAL HIGH (ref 0.61–1.24)
GFR calc Af Amer: 58 mL/min — ABNORMAL LOW (ref >60)
GFR calc Af Amer: 48 mL/min — ABNORMAL LOW (ref >60)
GFR calc non Af Amer: 50 mL/min — ABNORMAL LOW (ref >60)
GFR calc non Af Amer: 41 mL/min — ABNORMAL LOW (ref >60)
Glucose, Bld: 232 mg/dL — ABNORMAL HIGH (ref 70–99)
Glucose, Bld: 371 mg/dL — ABNORMAL HIGH (ref 70–99)
Potassium: 2.5 mmol/L — CL (ref 3.5–5.1)
Potassium: 4.1 mmol/L (ref 3.5–5.1)
Sodium: 139 mmol/L (ref 135–145)
Sodium: 139 mmol/L (ref 135–145)

## 2018-01-03 LAB — ECHOCARDIOGRAM COMPLETE
Height: 71 in
Weight: 3880.1 oz

## 2018-01-03 LAB — GLUCOSE, CAPILLARY
Glucose-Capillary: 347 mg/dL — ABNORMAL HIGH (ref 70–99)
Glucose-Capillary: 220 mg/dL — ABNORMAL HIGH (ref 70–99)
Glucose-Capillary: 377 mg/dL — ABNORMAL HIGH (ref 70–99)
Glucose-Capillary: 133 mg/dL — ABNORMAL HIGH (ref 70–99)

## 2018-01-03 LAB — MAGNESIUM: Magnesium: 2 mg/dL (ref 1.7–2.4)

## 2018-01-03 LAB — HIV ANTIBODY (ROUTINE TESTING W REFLEX): HIV Screen 4th Generation wRfx: NONREACTIVE

## 2018-01-03 LAB — TROPONIN I: Troponin I: 0.11 ng/mL (ref <0.03)

## 2018-01-03 MED ORDER — MOMETASONE FURO-FORMOTEROL FUM 100-5 MCG/ACT IN AERO
2.0000 | INHALATION_SPRAY | Freq: Two times a day (BID) | RESPIRATORY_TRACT | Status: DC
  Administered 2018-01-03 – 2018-01-04 (×2): 2 via RESPIRATORY_TRACT
  Filled 2018-01-03: qty 8.8

## 2018-01-03 MED ORDER — METHYLPREDNISOLONE SODIUM SUCC 40 MG IJ SOLR
40.0000 mg | Freq: Two times a day (BID) | INTRAMUSCULAR | Status: DC
  Administered 2018-01-03 – 2018-01-04 (×3): 40 mg via INTRAVENOUS
  Filled 2018-01-03 (×3): qty 1

## 2018-01-03 MED ORDER — POTASSIUM CHLORIDE CRYS ER 20 MEQ PO TBCR: 40.0000 meq | EXTENDED_RELEASE_TABLET | Freq: Two times a day (BID) | ORAL | Status: DC

## 2018-01-03 MED ORDER — SODIUM CHLORIDE 0.9 % IV BOLUS
250.0000 mL | Freq: Once | INTRAVENOUS | Status: AC
  Administered 2018-01-03: 250 mL via INTRAVENOUS

## 2018-01-03 MED ORDER — SPIRONOLACTONE 25 MG PO TABS
25.0000 mg | ORAL_TABLET | Freq: Every day | ORAL | Status: DC
  Administered 2018-01-03 – 2018-01-04 (×2): 25 mg via ORAL
  Filled 2018-01-03 (×2): qty 1

## 2018-01-03 MED ORDER — INSULIN ASPART 100 UNIT/ML ~~LOC~~ SOLN
4.0000 [IU] | Freq: Three times a day (TID) | SUBCUTANEOUS | Status: DC
  Administered 2018-01-03 – 2018-01-04 (×4): 4 [IU] via SUBCUTANEOUS

## 2018-01-03 MED ORDER — ASPIRIN EC 81 MG PO TBEC
81.0000 mg | DELAYED_RELEASE_TABLET | Freq: Every day | ORAL | Status: DC
  Administered 2018-01-03 – 2018-01-04 (×2): 81 mg via ORAL
  Filled 2018-01-03 (×2): qty 1

## 2018-01-03 MED ORDER — ALBUTEROL SULFATE (2.5 MG/3ML) 0.083% IN NEBU: 2.5000 mg | INHALATION_SOLUTION | RESPIRATORY_TRACT | Status: DC | PRN

## 2018-01-03 MED ORDER — SODIUM CHLORIDE 0.9 % IV SOLN
100.0000 mg | Freq: Two times a day (BID) | INTRAVENOUS | Status: DC
  Administered 2018-01-03 (×2): 100 mg via INTRAVENOUS
  Filled 2018-01-03 (×3): qty 100

## 2018-01-03 MED ORDER — MORPHINE SULFATE (PF) 2 MG/ML IV SOLN
2.0000 mg | INTRAVENOUS | Status: AC | PRN
  Administered 2018-01-03 – 2018-01-04 (×2): 2 mg via INTRAVENOUS
  Filled 2018-01-03 (×2): qty 1

## 2018-01-03 MED ORDER — INSULIN DETEMIR 100 UNIT/ML ~~LOC~~ SOLN
20.0000 [IU] | Freq: Two times a day (BID) | SUBCUTANEOUS | Status: DC
  Administered 2018-01-03 – 2018-01-04 (×3): 20 [IU] via SUBCUTANEOUS
  Filled 2018-01-03 (×4): qty 0.2

## 2018-01-03 MED ORDER — INSULIN ASPART 100 UNIT/ML ~~LOC~~ SOLN
0.0000 [IU] | Freq: Three times a day (TID) | SUBCUTANEOUS | Status: DC
  Administered 2018-01-03: 15 [IU] via SUBCUTANEOUS
  Administered 2018-01-03 – 2018-01-04 (×2): 5 [IU] via SUBCUTANEOUS
  Administered 2018-01-04: 3 [IU] via SUBCUTANEOUS

## 2018-01-03 MED ORDER — INSULIN ASPART 100 UNIT/ML ~~LOC~~ SOLN: 0.0000 [IU] | Freq: Every day | SUBCUTANEOUS | Status: DC

## 2018-01-03 MED ORDER — ISOSORBIDE MONONITRATE ER 30 MG PO TB24
15.0000 mg | ORAL_TABLET | Freq: Every day | ORAL | Status: DC
  Administered 2018-01-03: 15 mg via ORAL
  Filled 2018-01-03: qty 1

## 2018-01-03 MED ORDER — POTASSIUM CHLORIDE 10 MEQ/100ML IV SOLN
10.0000 meq | INTRAVENOUS | Status: AC
  Administered 2018-01-03 (×3): 10 meq via INTRAVENOUS
  Filled 2018-01-03 (×2): qty 100

## 2018-01-03 MED ORDER — ALBUTEROL SULFATE (2.5 MG/3ML) 0.083% IN NEBU
2.5000 mg | INHALATION_SOLUTION | RESPIRATORY_TRACT | Status: DC
  Administered 2018-01-03 – 2018-01-04 (×5): 2.5 mg via RESPIRATORY_TRACT
  Filled 2018-01-03 (×4): qty 3

## 2018-01-03 MED ORDER — POTASSIUM CHLORIDE 10 MEQ/100ML IV SOLN
INTRAVENOUS | Status: AC
  Administered 2018-01-03: 10 meq via INTRAVENOUS
  Filled 2018-01-03: qty 100

## 2018-01-03 MED ORDER — CARVEDILOL 12.5 MG PO TABS
12.5000 mg | ORAL_TABLET | Freq: Two times a day (BID) | ORAL | Status: DC
  Administered 2018-01-03: 12.5 mg via ORAL
  Filled 2018-01-03: qty 1

## 2018-01-03 MED ORDER — POTASSIUM CHLORIDE CRYS ER 20 MEQ PO TBCR
40.0000 meq | EXTENDED_RELEASE_TABLET | ORAL | Status: DC
  Administered 2018-01-03: 40 meq via ORAL
  Filled 2018-01-03: qty 2

## 2018-01-03 MED ORDER — FLUTICASONE PROPIONATE 50 MCG/ACT NA SUSP
2.0000 | Freq: Every day | NASAL | Status: DC
  Administered 2018-01-03 – 2018-01-04 (×2): 2 via NASAL
  Filled 2018-01-03: qty 16

## 2018-01-03 NOTE — Progress Notes (Signed)
VASCULAR LAB PRELIMINARY  ARTERIAL  ABI completed:  Resting bilateral ankle-brachial index are within normal range. No evidence of significant bilateral lower extremity arterial disease.       RIGHT    LEFT    PRESSURE WAVEFORM  PRESSURE WAVEFORM  BRACHIAL 170 Triphasic BRACHIAL 174 Triphasic  DP 170 Triphasic DP 169 Triphasic  PT 211 Triphasic PT 202 Triphasic  GREAT TOE 144  GREAT TOE 170     RIGHT LEFT  ABI 1.21 1.16     HONGYING  Denzel Etienne, RVT 01/03/2018, 12:22 PM

## 2018-01-03 NOTE — Progress Notes (Signed)
  Echocardiogram 2D Echocardiogram has been performed.  Merrie Roof F 01/03/2018, 11:09 AM

## 2018-01-03 NOTE — Care Management Note (Signed)
Case Management Note  Patient Details  Name: [name] MRN: [id number] Date of Birth: 1971/02/21  Subjective/Objective:   COPD with acute bronchitis, uncontrolled DM, HTN urgency                 Action/Plan: NCM spoke to pt and girlfriend at bedside. Provided pt with [place] letter with $3 copay and once per year use. Provided pt with brochures for [facility], [facility], and [facility] to call one to arrange follow up appt with see PCP on Tuesday. Pt may need DME, neb machine for home if dc home on neb solutions. Consult with NCM on tomorrow to follow up.   Expected Discharge Date:                  Expected Discharge Plan:  Home/Self Care  In-House Referral:  NA  Discharge planning Services  CM Consult, [facility], [facility]  Post Acute Care Choice:  NA Choice offered to:  NA    Status of Service:  In process, will continue to follow  If discussed at Long Length of Stay Meetings, dates discussed:    Additional Comments:  Erenest Rasher, RN 01/03/2018, 2:52 PM

## 2018-01-03 NOTE — Progress Notes (Signed)
NP on call, [place], paged several times regarding pt's BP and chest pain. Pt c/o chest pain intermittently with relief from morphine. Pt's BP remains high despite PRN and scheduled medication. NP aware. Pt resting comfortably at this time. Will continue to monitor.

## 2018-01-03 NOTE — Consult Note (Addendum)
CARDIOLOGY CONSULT NOTE  Patient ID: Matthew Odom, MRN: [id number], DOB/AGE: 05/10/1970 47 y.o. Admit date: 01/02/2018 Date of Consult: 01/03/2018  Primary Physician: Patient, No Pcp Per Primary Cardiologist: NEW Matthew Odom is a 47 y.o. male who is being seen today for the evaluation of CHF acute at the request of [place].   Chief Complaint: shortness of breath and leg pain   HPI Matthew Odom is a 47 y.o. male admitted with SOB and found blood pressure 213/134  He had noted cough with frothy sputum for about 24-48 hours.  Denies peripheral edema or abdominal distention.  He has mild chronic dyspnea on exertion.  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  He has a history of a cardiomyopathy apparently nonischemic based on his report of a catheterization at [facility] about 2 years ago.  Unfortunately, these records are not available in [facility].  He has a history of recurring hypokalemia, but also a history of hypokalemia??  Medications have been an issue.  He ran out of his carvedilol "weeks ago "but is been taking his diuretics.  Because of bilateral lower leg pain which he thought might be related to hypo kalemia he ended up taking high doses of ibuprofen    Evaluation ER showed cardiomegaly.  Past Medical History:  Diagnosis Date  . Cholesterosis   . Diabetes mellitus without complication ([place])   . Hypertension       Surgical History: History reviewed. No pertinent surgical history.   Home Meds: Prior to Admission medications   Medication Sig Start Date End Date Taking? Authorizing Provider  atorvastatin (LIPITOR) 40 MG tablet Take 40 mg by mouth daily.   Yes [provider]  carvedilol (COREG) 25 MG tablet Take 25 mg by mouth 2 (two) times daily with a meal.   Yes [provider]  diphenhydramine-acetaminophen (TYLENOL PM) 25-500 MG TABS tablet Take 1 tablet by mouth at bedtime as needed  (pain).   Yes [provider]  furosemide (LASIX) 20 MG tablet Take 20 mg by mouth daily.   Yes [provider]  hydrALAZINE (APRESOLINE) 50 MG tablet Take 50 mg by mouth 3 (three) times daily.   Yes [provider]  ibuprofen (ADVIL,MOTRIN) 200 MG tablet Take 400 mg by mouth every 6 (six) hours as needed for moderate pain.   Yes [provider]  lisinopril (PRINIVIL,ZESTRIL) 20 MG tablet Take 20 mg by mouth daily.   Yes [provider]  metFORMIN (GLUCOPHAGE) 500 MG tablet Take 500 mg by mouth 2 (two) times daily with a meal.    Yes [provider]  spironolactone (ALDACTONE) 25 MG tablet Take 25 mg by mouth daily.   Yes [provider]  atorvastatin (LIPITOR) 20 MG tablet Take 80 mg by mouth daily.    [provider]    Inpatient Medications:  . aspirin  325 mg Oral Daily  . atorvastatin  40 mg Oral Daily  . benzonatate  100 mg Oral TID  . chlorpheniramine-HYDROcodone  5 mL Oral Q12H  . fluticasone  2 spray Each Nare Daily  . furosemide  40 mg Intravenous Q12H  . heparin  5,000 Units Subcutaneous Q8H  . hydrALAZINE  50 mg Oral TID  . insulin aspart  0-5 Units Subcutaneous QHS  . insulin aspart  0-9 Units Subcutaneous TID WC  . ipratropium-albuterol  3 mL Nebulization Q6H  . nicotine  21 mg Transdermal Daily  .  potassium chloride  40 mEq Oral BID  . sodium chloride flush  3 mL Intravenous Q12H     Allergies: No Known Allergies  Social History   Socioeconomic History  . Marital status: Single    Spouse name: Not on file  . Number of children: Not on file  . Years of education: Not on file  . Highest education level: Not on file  Occupational History  . Not on file  Social Needs  . Financial resource strain: Not on file  . Food insecurity:    Worry: Not on file    Inability: Not on file  . Transportation needs:    Medical: Not on file    Non-medical: Not on file  Tobacco Use  . Smoking status:  Current Every Day Smoker    Packs/day: 1.00    Types: Cigarettes  . Smokeless tobacco: Never Used  Substance and Sexual Activity  . Alcohol use: Yes    Comment: occasionally  . Drug use: Never  . Sexual activity: Not on file  Lifestyle  . Physical activity:    Days per week: Not on file    Minutes per session: Not on file  . Stress: Not on file  Relationships  . Social connections:    Talks on phone: Not on file    Gets together: Not on file    Attends religious service: Not on file    Active member of club or organization: Not on file    Attends meetings of clubs or organizations: Not on file    Relationship status: Not on file  . Intimate partner violence:    Fear of current or ex partner: Not on file    Emotionally abused: Not on file    Physically abused: Not on file    Forced sexual activity: Not on file  Other Topics Concern  . Not on file  Social History Narrative  . Not on file     No family history on file.   ROS:  Please see the history of present illness.     All other systems reviewed and negative.    Physical Exam: Blood pressure (!) 178/96, pulse 92, temperature 98.6 F (37 C), temperature source Oral, resp. rate 20, height 5\' 11"  (1.803 m), weight 110 kg, SpO2 90 %. General: Well developed, well nourished male in no acute distress. Head: Normocephalic, atraumatic, sclera non-icteric, no xanthomas, nares are without discharge. EENT: normal Lymph Nodes:  none Back: without scoliosis/kyphosis , no CVA tendersness Neck: Negative for carotid bruits. JVD 8-10 Lungs: Clear bilaterally to auscultation without wheezes, rales, or rhonchi. Breathing is unlabored. Heart: RRR with S1 S2 +S4.  2/6 systolic  murmur , rubs, or gallops appreciated. Abdomen: Soft, non-tender, non-distended with normoactive bowel sounds. No hepatomegaly. No rebound/guarding. No obvious abdominal masses. Msk:  Strength and tone appear normal for age. Extremities: No clubbing or cyanosis.  No edema.  Distal pedal pulses are 2+ and equal bilaterally. Skin: Warm and Dry Neuro: Alert and oriented X 3. CN III-XII intact Grossly normal sensory and motor function . Psych:  Responds to questions appropriately with a normal affect.      Labs: Cardiac Enzymes Recent Labs    01/02/18 1559 01/02/18 2145 01/03/18 0335  TROPONINI 0.10* 0.11* 0.11*   CBC Lab Results  Component Value Date   WBC 7.7 01/02/2018   HGB 16.4 01/02/2018   HCT 48.0 01/02/2018   MCV 84.5 01/02/2018   PLT 226 01/02/2018   PROTIME: No  results for input(s): LABPROT, INR in the last 72 hours. Chemistry  Recent Labs  Lab 01/02/18 1248 01/03/18 0335  NA 142 139  K 3.1* 2.5*  CL 105 100  CO2 27 25  BUN 20 23*  CREATININE 1.45* 1.59*  CALCIUM 8.6* 8.2*  PROT 7.0  --   BILITOT 1.3*  --   ALKPHOS 62  --   ALT 26  --   AST 23  --   GLUCOSE 186* 232*   Lipids Lab Results  Component Value Date   CHOL 215 (H) 01/03/2018   HDL 35 (L) 01/03/2018   LDLCALC 146 (H) 01/03/2018   TRIG 168 (H) 01/03/2018   BNP No results found for: PROBNP Thyroid Function Tests: No results for input(s): TSH, T4TOTAL, T3FREE, THYROIDAB in the last 72 hours.  Invalid input(s): FREET3    Miscellaneous Lab Results  Component Value Date   DDIMER 0.44 01/02/2018    Radiology/Studies:  Dg Chest 2 View  Result Date: 01/02/2018 CLINICAL DATA:  Acute onset productive cough and shortness of breath. Current history of diabetes and hypertension. Current smoker. EXAM: CHEST - 2 VIEW COMPARISON:  None. FINDINGS: Cardiac silhouette markedly enlarged. Hilar and mediastinal contours otherwise unremarkable. Mild pulmonary venous hypertension without overt edema. Lungs clear. Bronchovascular markings normal. No visible pleural effusions. No pneumothorax. Visualized bony thorax intact. IMPRESSION: Marked cardiomegaly. Pulmonary venous hypertension without overt edema. No acute cardiopulmonary disease. Electronically Signed   By:  Evangeline Dakin M.D.   On: 01/02/2018 13:14  Personally reviewed    EKG: Sinus rhythm with left ventricular hypertrophy QRS widening and repolarization abnormalities   Assessment and Plan:  Dyspnea  Cardiomyopathy-nonischemic  Positive troponin  Sinus tachycardia   Hypertension/urgency  Diabetes  Cardiomegaly-chest x-ray  Hypokalemia  Renal insufficiency grade 3  Hyperlipidemia  Social stressors  The patient has, by his report, a negative catheterization about 20 months ago either at [facility] or [facility].  Unfortunately, I am not able to access care everywhere.  Medical release form should be submitted  This I think obviates the need for pursuing his troponin and also I think for ischemic trigger.  I suspect his troponin is secondary  Cardiomegaly is consistent with the aforementioned observation as his symptoms of heart failure.  He is much improved with diuresis.   Await echocardiogram   Will add carvedilol back for for hypertension as well as his cardiomyopathy and sinus tachycardia . He is much more compensated today   Would initiate aldactone with close track of his potassium .  The history of hyperkalemia is quite concerning especially in somebody for whom follow-up is not going to be easy.  Hence, we will start with Aldactone and leave ACE/Entresto for later  We will check renin/Aldo levels given hypokalemia and hypertension  His social stressors are normal.  He has just started a new job.  He has a 90-day probation which means that he would really really really like to be out of the hospital before Tuesday morning so he can return to work.  Given the fact that he is significantly better this might be possible.  I will arrange for another check of his metabolic profile this afternoon so we can work on getting his potassium normal.  He will need social services to help figure out how to get his medications.  Virl Axe

## 2018-01-03 NOTE — Progress Notes (Signed)
[facility] PROGRESS NOTE    Progress Note  Matthew Odom  PYP:[id number] DOB: 07-25-1970 DOA: 01/02/2018 PCP: Patient, No Pcp Per     Brief Narrative:   Matthew Odom is an 47 y.o. male past medical history of essential hypertension, diabetes mellitus, active smoker who presents to the ED complaining of dyspnea and coughing, but denies productive cough fever and chills.  He also describes orthopnea.  Assessment/Plan:   Acute respiratory failure with hypoxia (HCC) likely due to   COPD with acute bronchitis ([place]): - His chest x-ray shows shows mild pulmonary hypertension, no interstitial infiltrates, he has no JVD or hepatojugular reflux, no lower extremity edema BNP is 187, he is wheezing rhonchi's on physical exam, and ongoing smoker I am concerned about a acute bronchitis/COPD exacerbation.  Discontinue IV Lasix, he has already received 2 doses of IV Lasix and it has been a mild increase in his creatinine. - We will give him a bolus of normal saline. - He relates he has been smoking since the age of 77. - I will start him on IV steroids inhalers and antibiotics.  Uncontrolled Diabetes mellitus type 2 - Start him on IV Levemir and continue sliding scale insulin his A1c was 10.1 glucose this morning is 347. - I am sure his blood glucose will be elevated as we have just started him on IV steroids.  Hyperlipemia: - Continue statins.  Hypertensive urgency - Discontinue IV Lasix as I do not think he is fluid overloaded. - Continue hydralazine, and beta-blocker add oral M. Doerr.   Abnormal EKG - Cardiac biomarkers at 0.11 in the setting of chronic renal disease. - Chest pain is been consulted, 2D echo is pending.  Nicotine abuse Nicotine patch.   DVT prophylaxis: lovenox Family Communication:wife Disposition Plan/Barrier to D/C: home in am Code Status:     Code Status Orders  (From admission, onward)         Start     Ordered   01/02/18 1532  Full code  Continuous     01/02/18 1531        Code Status History    This patient has a current code status but no historical code status.        IV Access:    Peripheral IV   Procedures and diagnostic studies:   Dg Chest 2 View  Result Date: 01/02/2018 CLINICAL DATA:  Acute onset productive cough and shortness of breath. Current history of diabetes and hypertension. Current smoker. EXAM: CHEST - 2 VIEW COMPARISON:  None. FINDINGS: Cardiac silhouette markedly enlarged. Hilar and mediastinal contours otherwise unremarkable. Mild pulmonary venous hypertension without overt edema. Lungs clear. Bronchovascular markings normal. No visible pleural effusions. No pneumothorax. Visualized bony thorax intact. IMPRESSION: Marked cardiomegaly. Pulmonary venous hypertension without overt edema. No acute cardiopulmonary disease. Electronically Signed   By: Evangeline Dakin M.D.   On: 01/02/2018 13:14     Medical Consultants:    None.  Anti-Infectives:  DOxy  Subjective:    Matthew Odom his breathing is unchanged.  Objective:    Vitals:   01/03/18 0514 01/03/18 0521 01/03/18 0802 01/03/18 0805  BP: (!) 181/100 (!) 178/96    Pulse: 92     Resp:      Temp:      TempSrc:      SpO2:   90% 90%  Weight:      Height:        Intake/Output Summary (Last 24 hours) at 01/03/2018 2458 Last data filed at  01/03/2018 0600 Gross per 24 hour  Intake 307.14 ml  Output 2150 ml  Net -1842.86 ml   Filed Weights   01/02/18 1148 01/02/18 1519 01/03/18 0423  Weight: 108.9 kg 110.7 kg 110 kg    Exam: General exam: In no acute distress. Respiratory system: Good air movement and clear to auscultation. Cardiovascular system: S1 & S2 heard, RRR. No JVD, murmurs, rubs, gallops or clicks.  Gastrointestinal system: Abdomen is nondistended, soft and nontender.  Central nervous system: Alert and oriented. No focal neurological deficits. Extremities: No pedal edema. Skin: No rashes, lesions or ulcers Psychiatry: Judgement  and insight appear normal. Mood & affect appropriate.    Data Reviewed:    Labs: Basic Metabolic Panel: Recent Labs  Lab 01/02/18 1248 01/03/18 0335  NA 142 139  K 3.1* 2.5*  CL 105 100  CO2 27 25  GLUCOSE 186* 232*  BUN 20 23*  CREATININE 1.45* 1.59*  CALCIUM 8.6* 8.2*   GFR Estimated Creatinine Clearance: 72.5 mL/min (A) (by C-G formula based on SCr of 1.59 mg/dL (H)). Liver Function Tests: Recent Labs  Lab 01/02/18 1248  AST 23  ALT 26  ALKPHOS 62  BILITOT 1.3*  PROT 7.0  ALBUMIN 3.7   No results for input(s): LIPASE, AMYLASE in the last 168 hours. No results for input(s): AMMONIA in the last 168 hours. Coagulation profile No results for input(s): INR, PROTIME in the last 168 hours.  CBC: Recent Labs  Lab 01/02/18 1248  WBC 7.7  NEUTROABS 5.8  HGB 16.4  HCT 48.0  MCV 84.5  PLT 226   Cardiac Enzymes: Recent Labs  Lab 01/02/18 1559 01/02/18 2145 01/03/18 0335  TROPONINI 0.10* 0.11* 0.11*   BNP (last 3 results) No results for input(s): PROBNP in the last 8760 hours. CBG: Recent Labs  Lab 01/02/18 1611 01/02/18 2037 01/03/18 0855  GLUCAP 190* 261* 347*   D-Dimer: Recent Labs    01/02/18 1248  DDIMER 0.44   Hgb A1c: Recent Labs    01/02/18 1559  HGBA1C 10.1*   Lipid Profile: Recent Labs    01/03/18 0335  CHOL 215*  HDL 35*  LDLCALC 146*  TRIG 168*  CHOLHDL 6.1   Thyroid function studies: No results for input(s): TSH, T4TOTAL, T3FREE, THYROIDAB in the last 72 hours.  Invalid input(s): FREET3 Anemia work up: No results for input(s): VITAMINB12, FOLATE, FERRITIN, TIBC, IRON, RETICCTPCT in the last 72 hours. Sepsis Labs: Recent Labs  Lab 01/02/18 1248  WBC 7.7   Microbiology No results found for this or any previous visit (from the past 240 hour(s)).   Medications:   . aspirin EC  81 mg Oral Daily  . atorvastatin  40 mg Oral Daily  . benzonatate  100 mg Oral TID  . chlorpheniramine-HYDROcodone  5 mL Oral Q12H  .  fluticasone  2 spray Each Nare Daily  . furosemide  40 mg Intravenous Q12H  . heparin  5,000 Units Subcutaneous Q8H  . hydrALAZINE  50 mg Oral TID  . insulin aspart  0-5 Units Subcutaneous QHS  . insulin aspart  0-9 Units Subcutaneous TID WC  . ipratropium-albuterol  3 mL Nebulization Q6H  . nicotine  21 mg Transdermal Daily  . potassium chloride  40 mEq Oral Q3H  . sodium chloride flush  3 mL Intravenous Q12H   Continuous Infusions: . sodium chloride 30 mL/hr at 01/03/18 0600     LOS: 1 day   Charlynne Cousins  [facility] Pager [phone]  *Please  refer to [email or web address], password [email or web address] to get updated schedule on who will round on this patient, as hospitalists switch teams weekly. If 7PM-7AM, please contact night-coverage at [email or web address], password [email or web address] for any overnight needs.  01/03/2018, 9:09 AM

## 2018-01-04 DIAGNOSIS — I5043 Acute on chronic combined systolic (congestive) and diastolic (congestive) heart failure: Secondary | ICD-10-CM

## 2018-01-04 DIAGNOSIS — I420 Dilated cardiomyopathy: Secondary | ICD-10-CM

## 2018-01-04 LAB — BASIC METABOLIC PANEL
Anion gap: 10 (ref 5–15)
BUN: 22 mg/dL — ABNORMAL HIGH (ref 6–20)
CO2: 25 mmol/L (ref 22–32)
Calcium: 8.5 mg/dL — ABNORMAL LOW (ref 8.9–10.3)
Chloride: 103 mmol/L (ref 98–111)
Creatinine, Ser: 1.35 mg/dL — ABNORMAL HIGH (ref 0.61–1.24)
GFR calc Af Amer: >60 mL/min (ref >60)
GFR calc non Af Amer: >60 mL/min (ref >60)
Glucose, Bld: 226 mg/dL — ABNORMAL HIGH (ref 70–99)
Potassium: 3.6 mmol/L (ref 3.5–5.1)
Sodium: 138 mmol/L (ref 135–145)

## 2018-01-04 LAB — GLUCOSE, CAPILLARY
Glucose-Capillary: 199 mg/dL — ABNORMAL HIGH (ref 70–99)
Glucose-Capillary: 212 mg/dL — ABNORMAL HIGH (ref 70–99)

## 2018-01-04 MED ORDER — DOXYCYCLINE HYCLATE 100 MG PO TABS
100.0000 mg | ORAL_TABLET | Freq: Two times a day (BID) | ORAL | Status: DC
  Administered 2018-01-04: 100 mg via ORAL
  Filled 2018-01-04: qty 1

## 2018-01-04 MED ORDER — DOXYCYCLINE HYCLATE 100 MG PO TABS: 100.0000 mg | ORAL_TABLET | Freq: Two times a day (BID) | ORAL | 0 refills | Status: DC

## 2018-01-04 MED ORDER — CARVEDILOL 25 MG PO TABS: 25.0000 mg | ORAL_TABLET | Freq: Two times a day (BID) | ORAL | 3 refills | Status: DC

## 2018-01-04 MED ORDER — LISINOPRIL 5 MG PO TABS: 10.0000 mg | ORAL_TABLET | Freq: Every day | ORAL | 11 refills | Status: DC

## 2018-01-04 MED ORDER — CARVEDILOL 25 MG PO TABS
25.0000 mg | ORAL_TABLET | Freq: Two times a day (BID) | ORAL | Status: DC
  Administered 2018-01-04: 25 mg via ORAL
  Filled 2018-01-04: qty 1

## 2018-01-04 MED ORDER — SPIRONOLACTONE 25 MG PO TABS: 25.0000 mg | ORAL_TABLET | Freq: Every day | ORAL | 0 refills | Status: DC

## 2018-01-04 MED ORDER — HYDRALAZINE HCL 50 MG PO TABS
100.0000 mg | ORAL_TABLET | Freq: Three times a day (TID) | ORAL | Status: DC
  Administered 2018-01-04: 100 mg via ORAL
  Filled 2018-01-04: qty 2

## 2018-01-04 MED ORDER — IPRATROPIUM-ALBUTEROL 0.5-2.5 (3) MG/3ML IN SOLN
3.0000 mL | Freq: Four times a day (QID) | RESPIRATORY_TRACT | Status: DC
  Filled 2018-01-04: qty 3

## 2018-01-04 MED ORDER — LABETALOL HCL 5 MG/ML IV SOLN
10.0000 mg | Freq: Once | INTRAVENOUS | Status: AC
  Administered 2018-01-04: 10 mg via INTRAVENOUS
  Filled 2018-01-04: qty 4

## 2018-01-04 MED ORDER — BUTALBITAL-APAP-CAFFEINE 50-325-40 MG PO TABS
2.0000 | ORAL_TABLET | Freq: Once | ORAL | Status: DC | PRN
  Filled 2018-01-04: qty 2

## 2018-01-04 MED ORDER — INSULIN DETEMIR 100 UNIT/ML FLEXPEN: 40.0000 [IU] | Freq: Every day | SUBCUTANEOUS | 3 refills | Status: DC

## 2018-01-04 MED ORDER — LISINOPRIL 20 MG PO TABS
20.0000 mg | ORAL_TABLET | Freq: Every day | ORAL | Status: DC
  Administered 2018-01-04: 20 mg via ORAL
  Filled 2018-01-04: qty 1

## 2018-01-04 MED ORDER — INSULIN PEN NEEDLE 31G X 6 MM MISC: 1.0000 | Freq: Two times a day (BID) | 3 refills | Status: DC

## 2018-01-04 MED ORDER — TRAMADOL HCL 50 MG PO TABS: 50.0000 mg | ORAL_TABLET | Freq: Two times a day (BID) | ORAL | Status: DC | PRN

## 2018-01-04 MED ORDER — METFORMIN HCL 500 MG PO TABS: 1000.0000 mg | ORAL_TABLET | Freq: Two times a day (BID) | ORAL | 0 refills | Status: DC

## 2018-01-04 MED ORDER — HYDRALAZINE HCL 100 MG PO TABS: 100.0000 mg | ORAL_TABLET | Freq: Three times a day (TID) | ORAL | 0 refills | Status: DC

## 2018-01-04 NOTE — Progress Notes (Signed)
Inpatient Diabetes Program Recommendations  AACE/ADA: New Consensus Statement on Inpatient Glycemic Control (2015)  Target Ranges:  Prepandial:   less than 140 mg/dL      Peak postprandial:   less than 180 mg/dL (1-2 hours)      Critically ill patients:  140 - 180 mg/dL   Lab Results  Component Value Date   GLUCAP 212 (H) 01/04/2018   HGBA1C 10.1 (H) 01/02/2018    Review of Glycemic Control  Diabetes history: DM2 Outpatient Diabetes medications: metformin 500 mg bid Current orders for Inpatient glycemic control: Levemir 20 units bid, Novolog 0-15 units tidwc and hs + 4 units tidwc  To be d/ced on Levemir 40 units QD, metformin 1000 mg bid. Has [place] letter for meds. To call [facility] for MD appt tomorrow am.  Pt states he has zero money for any medicine. He will need to get into [facility] before his insulin runs out. Stressed importance of taking insulin as prescribed. Discussed HgbA1C of 10.1% and goal of 7%. Instructed pt to check blood sugars at least 3x/day and take logbook to MD appt for any needed adjustments. Stressed how diet, exercise and stress management all play a role in diabetes management.   Will also need prescription for syringes [phone])   Thank you. Lorenda Peck, RD, LDN, CDE Inpatient Diabetes Coordinator [phone]

## 2018-01-04 NOTE — Progress Notes (Signed)
BP rechecked after Hydralazine dose.  185/113, results called to MD.  Pt stated he had to leave because his ride was here.  D/C instructions given to pt and girlfriend and importance to adherence to this regimen was emphasized.

## 2018-01-04 NOTE — Progress Notes (Addendum)
Progress Note  Patient Name: Matthew Odom Date of Encounter: 01/04/2018  Primary Cardiologist: No primary care provider on file.   Subjective   Denies any chest pain or SOB currently.  BP remains elevated at 144/163mmHg this am.  He put out 3.4L yesterday and is net neg 3.5L.  COmplains of severe HA on Imdur requiring several doses of morphine last night.   Inpatient Medications    Scheduled Meds: . albuterol  2.5 mg Inhalation Q4H  . aspirin EC  81 mg Oral Daily  . atorvastatin  40 mg Oral Daily  . benzonatate  100 mg Oral TID  . carvedilol  12.5 mg Oral BID WC  . chlorpheniramine-HYDROcodone  5 mL Oral Q12H  . fluticasone  2 spray Each Nare Daily  . heparin  5,000 Units Subcutaneous Q8H  . hydrALAZINE  50 mg Oral TID  . insulin aspart  0-15 Units Subcutaneous TID WC  . insulin aspart  0-5 Units Subcutaneous QHS  . insulin aspart  4 Units Subcutaneous TID WC  . insulin detemir  20 Units Subcutaneous BID  . isosorbide mononitrate  15 mg Oral Daily  . methylPREDNISolone (SOLU-MEDROL) injection  40 mg Intravenous Q12H  . mometasone-formoterol  2 puff Inhalation BID  . nicotine  21 mg Transdermal Daily  . sodium chloride flush  3 mL Intravenous Q12H  . spironolactone  25 mg Oral Daily   Continuous Infusions: . sodium chloride 10 mL/hr at 01/03/18 1800  . doxycycline (VIBRAMYCIN) IV 100 mg (01/03/18 2159)   PRN Meds: sodium chloride, acetaminophen, albuterol, hydrALAZINE, ondansetron (ZOFRAN) IV, sodium chloride flush, traMADol   Vital Signs    Vitals:   01/04/18 0433 01/04/18 0440 01/04/18 0500 01/04/18 0600  BP: (!) 205/115  (!) 186/110 (!) 144/108  Pulse:      Resp:      Temp:      TempSrc:      SpO2:  96%    Weight:      Height:        Intake/Output Summary (Last 24 hours) at 01/04/2018 0637 Last data filed at 01/04/2018 0519 Gross per 24 hour  Intake 1774.55 ml  Output 3400 ml  Net -1625.45 ml   Filed Weights   01/02/18 1519 01/03/18 0423 01/04/18 0416    Weight: 110.7 kg 110 kg 109.4 kg    Telemetry    NSR - Personally Reviewed  ECG    No  New EKG to review - Personally Reviewed  Physical Exam   GEN: No acute distress.   Neck: No JVD Cardiac: RRR, no murmurs, rubs, or gallops.  Respiratory: Clear to auscultation bilaterally. GI: Soft, nontender, non-distended  MS: No edema; No deformity. Neuro:  Nonfocal  Psych: Normal affect   Labs    Chemistry Recent Labs  Lab 01/02/18 1248 01/03/18 0335 01/03/18 1359 01/04/18 0427  NA 142 139 139 138  K 3.1* 2.5* 4.1 3.6  CL 105 100 101 103  CO2 27 25 28 25   GLUCOSE 186* 232* 371* 226*  BUN 20 23* 23* 22*  CREATININE 1.45* 1.59* 1.86* 1.35*  CALCIUM 8.6* 8.2* 8.8* 8.5*  PROT 7.0  --   --   --   ALBUMIN 3.7  --   --   --   AST 23  --   --   --   ALT 26  --   --   --   ALKPHOS 62  --   --   --   BILITOT 1.3*  --   --   --  GFRNONAA 56* 50* 41* >60  GFRAA >60 58* 48* >60  ANIONGAP 10 14 10 10      Hematology Recent Labs  Lab 01/02/18 1248  WBC 7.7  RBC 5.68  HGB 16.4  HCT 48.0  MCV 84.5  MCH 28.9  MCHC 34.2  RDW 14.5  PLT 226    Cardiac Enzymes Recent Labs  Lab 01/02/18 1559 01/02/18 2145 01/03/18 0335  TROPONINI 0.10* 0.11* 0.11*    Recent Labs  Lab 01/02/18 1248  TROPIPOC 0.08     BNP Recent Labs  Lab 01/02/18 1248  BNP 184.7*     DDimer  Recent Labs  Lab 01/02/18 1248  DDIMER 0.44     Radiology    Dg Chest 2 View  Result Date: 01/02/2018 CLINICAL DATA:  Acute onset productive cough and shortness of breath. Current history of diabetes and hypertension. Current smoker. EXAM: CHEST - 2 VIEW COMPARISON:  None. FINDINGS: Cardiac silhouette markedly enlarged. Hilar and mediastinal contours otherwise unremarkable. Mild pulmonary venous hypertension without overt edema. Lungs clear. Bronchovascular markings normal. No visible pleural effusions. No pneumothorax. Visualized bony thorax intact. IMPRESSION: Marked cardiomegaly. Pulmonary venous  hypertension without overt edema. No acute cardiopulmonary disease. Electronically Signed   By: Evangeline Dakin M.D.   On: 01/02/2018 13:14    Cardiac Studies   2D echo 01/03/2018 Study Conclusions  - Left ventricle: The cavity size was mildly dilated. There was   severe concentric hypertrophy. Suggestive of hypertensive heart   disease, cannot exclude infiltrative cardiomyopathy. Systolic   function was mildly to moderately reduced. The estimated ejection   fraction was in the range of 40% to 45%. Diffuse hypokinesis.   Features are consistent with a pseudonormal left ventricular   filling pattern, with concomitant abnormal relaxation and   increased filling pressure (grade 2 diastolic dysfunction). - Mitral valve: There was mild regurgitation. - Left atrium: The atrium was at the upper limits of normal in   size. - Right atrium: The atrium was mildly to moderately dilated.   Central venous pressure (est): 3 mm Hg. - Atrial septum: No defect or patent foramen ovale was identified. - Tricuspid valve: There was trivial regurgitation. - Pulmonary arteries: Systolic pressure could not be accurately   estimated. - Pericardium, extracardiac: There was no pericardial effusion.  Patient Profile     47 y.o. male with h/o DM and HTN who is being seen for SOB and CHF.  Assessment & Plan    1.  SOB -likely related to volume overload -cath 20 months ago with normal coronary arteries -echo this admit with severe LVH and mild LV dysfunction EF 40-45% and grade 2 DD likely related to hypertensive heart diseaes -needs aggressive treatment of HTN  2.  Nonischemic DCM -normal coronary arteries by cath in [place] 20 months ago.  -EF 40-45% by echo  -needs good BP control -increase carvedilol to 25mg  BID and continue Hydralazine 50mg  TID and spironolactone 25mg  daily.   -stop Imdur to due severe HA -need to follow K+ closely with h/o hyperkalemia -hold on ACE/Entresto for now give h/o  hyperkalemia  3.  Elevated troponin -normal cors by cath last year -minimally elevated with flat trend (0.1>0.11>0.11) -related to demand ischemia in the setting of hypertensive urgency and acute CHF -no further workup indicated  4.  Hypertensive urgency -BP remains elevated at 144-186/108-147mmHg.  -increase carvedilol to 25mg  BID -continue Hydralazine and spiro and increase as needed for Bp control  5.  Acute on chronic combined systolic/diastolic CHF -  he put out 3.4L yesterday and is net neg 3.5L -weight down 3lbs from admit -continue on  -continue       For questions or updates, please contact [place] Please consult [email or web address] for contact info under Cardiology/STEMI.      Signed, Fransico Him, MD  01/04/2018, 6:37 AM

## 2018-01-04 NOTE — Care Management Note (Signed)
Case Management Note  Patient Details  Name: Matthew Odom MRN: [id number] Date of Birth: 01-05-1971  Subjective/Objective: confirmed patient has [place] Letter,& has $3 co pay for each med-patient voiced understanding of calling tomorrow for pcp appt, & going to pharmacies listed on [facility] letter. CM referral for possible neb machine-There are no nebulizer meds on d/c summary or avs,therefore no neb machine ordered, or needed.No further CM needs.                   Action/Plan:d/c home.   Expected Discharge Date:  01/04/18               Expected Discharge Plan:  Home/Self Care  In-House Referral:  NA  Discharge planning Services  CM Consult, [place] [facility], [facility]  Post Acute Care Choice:  NA Choice offered to:  NA  DME Arranged:    DME Agency:     HH Arranged:    HH Agency:     Status of Service:  Completed, signed off  If discussed at [employer], dates discussed:    Additional Comments:  Dessa Phi, RN 01/04/2018, 10:27 AM

## 2018-01-04 NOTE — Progress Notes (Signed)
Patient got upset about not getting Pain med for his headache. RN paged oncall but Patient refused 2 new orders (Fiorecet and Tramadol) . RN tried to explain patient about trying the new medicine. Pt still refused. Pt had 2mg  morphine at 2 am but it was been discontinued. Will continue to monitor.

## 2018-01-04 NOTE — Discharge Summary (Signed)
Physician Discharge Summary  Matthew Odom YVO:[id number] DOB: 09/10/1970 DOA: 01/02/2018  PCP: Patient, No Pcp Per  Admit date: 01/02/2018 Discharge date: 01/04/2018  Admitted From: home Disposition:  Home  Recommendations for Outpatient Follow-up:  1. Follow up with PCP in next week 2. Please obtain BMP/CBC in one week 3.  Home Health:no Equipment/Devices:None  Discharge Condition:stable CODE STATUS:full Diet recommendation: Heart Healthy  Brief/Interim Summary: 47 y.o. male past medical history of essential hypertension, diabetes mellitus, active smoker who presents to the ED complaining of dyspnea and coughing, but denies productive cough fever and chills.  He also describes orthopnea.  Discharge Diagnoses:  Active Problems:   Diabetes ([place])   Acute respiratory failure with hypoxia (HCC)   Hyperlipemia   Hypertensive urgency   Abnormal EKG   COPD with acute bronchitis (HCC)   Leg pain   Nicotine abuse   CKD (chronic kidney disease), stage III (HCC)  Acute respiratory failure with hypoxia likely due to COPD/acute bronchitis: His chest x-ray show mild pulmonary hypertension with no interstitial infiltrates, no JVD or hepatojugular reflux on physical exam BNP was 187. Physical exam he was wheezing and with rhonchi's with an ongoing smoker. So he was started on IV steroids IV antibiotics and inhalers and his shortness of breath improved. He will continue these as an outpatient.   Uncontrolled diabetes mellitus type II: A1c of 10.1, his metformin was increased to thousand and his blood glucose continue to run around 300. He was started on Levemir 40 units daily which she will continue at home he knows how to check and he has a machine to check his blood sugars at home.  Hyperlipidemia: Continue statins.  Hypertensive urgency: He was started on hydralazine, beta-blockers, and ACE inhibitor as well as Aldactone. And his blood pressure improved. He will follow-up with his PCP  in 1 week here will check a basic metabolic panel. He relate he has to leave the hospital if he was not discharged he was going to have to leave [place]. The risk and benefits were explained to him and he seems to understand he was given a prescription of all of his medication and he is to follow-up with a primary care doctor in 1 week.  Abnormal EKG/elevated troponins: In the setting of chronic renal disease likely demand ischemia he denies any chest pain.  Discharge Instructions  Discharge Instructions    Diet - low sodium heart healthy   Complete by:  As directed    Increase activity slowly   Complete by:  As directed      Allergies as of 01/04/2018   No Known Allergies     Medication List    STOP taking these medications   furosemide 20 MG tablet Commonly known as:  LASIX   ibuprofen 200 MG tablet Commonly known as:  ADVIL,MOTRIN     TAKE these medications   atorvastatin 20 MG tablet Commonly known as:  LIPITOR Take 80 mg by mouth daily.   atorvastatin 40 MG tablet Commonly known as:  LIPITOR Take 40 mg by mouth daily.   carvedilol 25 MG tablet Commonly known as:  COREG Take 1 tablet (25 mg total) by mouth 2 (two) times daily with a meal.   diphenhydramine-acetaminophen 25-500 MG Tabs tablet Commonly known as:  TYLENOL PM Take 1 tablet by mouth at bedtime as needed (pain).   doxycycline 100 MG tablet Commonly known as:  VIBRA-TABS Take 1 tablet (100 mg total) by mouth every 12 (twelve) hours.  hydrALAZINE 100 MG tablet Commonly known as:  APRESOLINE Take 1 tablet (100 mg total) by mouth 3 (three) times daily. What changed:    medication strength  how much to take   insulin detemir 100 unit/ml Soln Commonly known as:  LEVEMIR Inject 0.4 mLs (40 Units total) into the skin daily.   lisinopril 5 MG tablet Commonly known as:  PRINIVIL,ZESTRIL Take 2 tablets (10 mg total) by mouth daily. What changed:    medication strength  how much  to take   metFORMIN 500 MG tablet Commonly known as:  GLUCOPHAGE Take 2 tablets (1,000 mg total) by mouth 2 (two) times daily with a meal. What changed:  how much to take   spironolactone 25 MG tablet Commonly known as:  ALDACTONE Take 1 tablet (25 mg total) by mouth daily.       No Known Allergies  Consultations:  Cardiology   Procedures/Studies: Dg Chest 2 View  Result Date: 01/02/2018 CLINICAL DATA:  Acute onset productive cough and shortness of breath. Current history of diabetes and hypertension. Current smoker. EXAM: CHEST - 2 VIEW COMPARISON:  None. FINDINGS: Cardiac silhouette markedly enlarged. Hilar and mediastinal contours otherwise unremarkable. Mild pulmonary venous hypertension without overt edema. Lungs clear. Bronchovascular markings normal. No visible pleural effusions. No pneumothorax. Visualized bony thorax intact. IMPRESSION: Marked cardiomegaly. Pulmonary venous hypertension without overt edema. No acute cardiopulmonary disease. Electronically Signed   By: Evangeline Dakin M.D.   On: 01/02/2018 13:14     Subjective: Headache is improving otherwise his breathing is improved.  Discharge Exam: Vitals:   01/04/18 0600 01/04/18 0839  BP: (!) 144/108   Pulse:    Resp:    Temp:    SpO2:  97%   Vitals:   01/04/18 0440 01/04/18 0500 01/04/18 0600 01/04/18 0839  BP:  (!) 186/110 (!) 144/108   Pulse:      Resp:      Temp:      TempSrc:      SpO2: 96%   97%  Weight:      Height:        General: Pt is alert, awake, not in acute distress Cardiovascular: RRR, S1/S2 +, no rubs, no gallops Respiratory: CTA bilaterally, no wheezing, no rhonchi Abdominal: Soft, NT, ND, bowel sounds + Extremities: no edema, no cyanosis    The results of significant diagnostics from this hospitalization (including imaging, microbiology, ancillary and laboratory) are listed below for reference.     Microbiology: No results found for this or any previous visit (from the  past 240 hour(s)).   Labs: BNP (last 3 results) Recent Labs    01/02/18 1248  BNP 097.3*   Basic Metabolic Panel: Recent Labs  Lab 01/02/18 1248 01/03/18 0335 01/03/18 1359 01/04/18 0427  NA 142 139 139 138  K 3.1* 2.5* 4.1 3.6  CL 105 100 101 103  CO2 27 25 28 25   GLUCOSE 186* 232* 371* 226*  BUN 20 23* 23* 22*  CREATININE 1.45* 1.59* 1.86* 1.35*  CALCIUM 8.6* 8.2* 8.8* 8.5*  MG  --   --  2.0  --    Liver Function Tests: Recent Labs  Lab 01/02/18 1248  AST 23  ALT 26  ALKPHOS 62  BILITOT 1.3*  PROT 7.0  ALBUMIN 3.7   No results for input(s): LIPASE, AMYLASE in the last 168 hours. No results for input(s): AMMONIA in the last 168 hours. CBC: Recent Labs  Lab 01/02/18 1248  WBC 7.7  NEUTROABS 5.8  HGB 16.4  HCT 48.0  MCV 84.5  PLT 226   Cardiac Enzymes: Recent Labs  Lab 01/02/18 1559 01/02/18 2145 01/03/18 0335  TROPONINI 0.10* 0.11* 0.11*   BNP: Invalid input(s): POCBNP CBG: Recent Labs  Lab 01/02/18 2037 01/03/18 0855 01/03/18 1244 01/03/18 1700 01/03/18 2146  GLUCAP 261* 347* 220* 377* 133*   D-Dimer Recent Labs    01/02/18 1248  DDIMER 0.44   Hgb A1c Recent Labs    01/02/18 1559  HGBA1C 10.1*   Lipid Profile Recent Labs    01/03/18 0335  CHOL 215*  HDL 35*  LDLCALC 146*  TRIG 168*  CHOLHDL 6.1   Thyroid function studies No results for input(s): TSH, T4TOTAL, T3FREE, THYROIDAB in the last 72 hours.  Invalid input(s): FREET3 Anemia work up No results for input(s): VITAMINB12, FOLATE, FERRITIN, TIBC, IRON, RETICCTPCT in the last 72 hours. Urinalysis No results found for: COLORURINE, APPEARANCEUR, LABSPEC, [place], GLUCOSEU, HGBUR, BILIRUBINUR, KETONESUR, PROTEINUR, UROBILINOGEN, NITRITE, LEUKOCYTESUR Sepsis Labs Invalid input(s): PROCALCITONIN,  WBC,  LACTICIDVEN Microbiology No results found for this or any previous visit (from the past 240 hour(s)).   Time coordinating discharge: 40  minutes  SIGNED:   Charlynne Cousins, MD  [facility] 01/04/2018, 9:42 AM Pager   If 7PM-7AM, please contact night-coverage [email or web address] Password [email or web address]

## 2018-01-04 NOTE — Progress Notes (Signed)
Pt initial BP this morning was 212/146. PRN BP meds was given. Rechecked BP 186/110. Oncall N.P. Informed and ordered Labetalol IV. Will continue to monitor.

## 2018-01-08 LAB — ALDOSTERONE + RENIN ACTIVITY W/ RATIO
ALDO / PRA Ratio: 4.9 (ref 0.0–30.0)
Aldosterone: 9.5 ng/dL (ref 0.0–30.0)
PRA LC/MS/MS: 1.93 ng/mL/hr (ref 0.167–5.380)

## 2018-04-04 DIAGNOSIS — M48062 Spinal stenosis, lumbar region with neurogenic claudication: Secondary | ICD-10-CM | POA: Insufficient documentation

## 2018-04-04 DIAGNOSIS — M7138 Other bursal cyst, other site: Secondary | ICD-10-CM | POA: Insufficient documentation

## 2018-04-04 DIAGNOSIS — M4310 Spondylolisthesis, site unspecified: Secondary | ICD-10-CM | POA: Insufficient documentation

## 2018-04-04 DIAGNOSIS — M5416 Radiculopathy, lumbar region: Secondary | ICD-10-CM | POA: Insufficient documentation

## 2018-06-14 ENCOUNTER — Other Ambulatory Visit: Payer: Self-pay | Admitting: Family Medicine

## 2018-06-14 ENCOUNTER — Ambulatory Visit: Payer: Self-pay

## 2018-06-14 DIAGNOSIS — M545 Low back pain, unspecified: Secondary | ICD-10-CM

## 2018-06-14 DIAGNOSIS — M542 Cervicalgia: Secondary | ICD-10-CM

## 2018-06-29 ENCOUNTER — Other Ambulatory Visit: Payer: Self-pay

## 2018-06-29 ENCOUNTER — Encounter (HOSPITAL_COMMUNITY): Payer: Self-pay | Admitting: *Deleted

## 2018-06-29 ENCOUNTER — Observation Stay (HOSPITAL_COMMUNITY)
Admission: EM | Admit: 2018-06-29 | Discharge: 2018-06-29 | Discharge status: Against Medical Advice | Payer: BLUE CROSS/BLUE SHIELD | Attending: Internal Medicine | Admitting: Internal Medicine

## 2018-06-29 ENCOUNTER — Emergency Department (HOSPITAL_COMMUNITY): Payer: BLUE CROSS/BLUE SHIELD

## 2018-06-29 ENCOUNTER — Observation Stay (HOSPITAL_BASED_OUTPATIENT_CLINIC_OR_DEPARTMENT_OTHER): Payer: BLUE CROSS/BLUE SHIELD

## 2018-06-29 DIAGNOSIS — R9431 Abnormal electrocardiogram [ECG] [EKG]: Secondary | ICD-10-CM | POA: Diagnosis not present

## 2018-06-29 DIAGNOSIS — Z7984 Long term (current) use of oral hypoglycemic drugs: Secondary | ICD-10-CM | POA: Diagnosis not present

## 2018-06-29 DIAGNOSIS — Z79899 Other long term (current) drug therapy: Secondary | ICD-10-CM | POA: Diagnosis not present

## 2018-06-29 DIAGNOSIS — N186 End stage renal disease: Secondary | ICD-10-CM | POA: Diagnosis present

## 2018-06-29 DIAGNOSIS — N183 Chronic kidney disease, stage 3 unspecified: Secondary | ICD-10-CM | POA: Diagnosis present

## 2018-06-29 DIAGNOSIS — E1122 Type 2 diabetes mellitus with diabetic chronic kidney disease: Secondary | ICD-10-CM | POA: Insufficient documentation

## 2018-06-29 DIAGNOSIS — I10 Essential (primary) hypertension: Secondary | ICD-10-CM | POA: Diagnosis present

## 2018-06-29 DIAGNOSIS — R0602 Shortness of breath: Secondary | ICD-10-CM | POA: Diagnosis present

## 2018-06-29 DIAGNOSIS — Z91120 Patient's intentional underdosing of medication regimen due to financial hardship: Secondary | ICD-10-CM | POA: Diagnosis not present

## 2018-06-29 DIAGNOSIS — E785 Hyperlipidemia, unspecified: Secondary | ICD-10-CM | POA: Insufficient documentation

## 2018-06-29 DIAGNOSIS — I169 Hypertensive crisis, unspecified: Secondary | ICD-10-CM | POA: Insufficient documentation

## 2018-06-29 DIAGNOSIS — T383X6A Underdosing of insulin and oral hypoglycemic [antidiabetic] drugs, initial encounter: Secondary | ICD-10-CM | POA: Diagnosis not present

## 2018-06-29 DIAGNOSIS — Z720 Tobacco use: Secondary | ICD-10-CM | POA: Diagnosis present

## 2018-06-29 DIAGNOSIS — E119 Type 2 diabetes mellitus without complications: Secondary | ICD-10-CM

## 2018-06-29 DIAGNOSIS — F17210 Nicotine dependence, cigarettes, uncomplicated: Secondary | ICD-10-CM | POA: Insufficient documentation

## 2018-06-29 DIAGNOSIS — R079 Chest pain, unspecified: Secondary | ICD-10-CM | POA: Diagnosis present

## 2018-06-29 DIAGNOSIS — I712 Thoracic aortic aneurysm, without rupture: Secondary | ICD-10-CM | POA: Diagnosis not present

## 2018-06-29 DIAGNOSIS — R778 Other specified abnormalities of plasma proteins: Secondary | ICD-10-CM | POA: Diagnosis present

## 2018-06-29 DIAGNOSIS — R7989 Other specified abnormal findings of blood chemistry: Secondary | ICD-10-CM | POA: Diagnosis not present

## 2018-06-29 DIAGNOSIS — I160 Hypertensive urgency: Principal | ICD-10-CM

## 2018-06-29 DIAGNOSIS — I129 Hypertensive chronic kidney disease with stage 1 through stage 4 chronic kidney disease, or unspecified chronic kidney disease: Secondary | ICD-10-CM | POA: Insufficient documentation

## 2018-06-29 DIAGNOSIS — Z992 Dependence on renal dialysis: Secondary | ICD-10-CM | POA: Diagnosis present

## 2018-06-29 DIAGNOSIS — I152 Hypertension secondary to endocrine disorders: Secondary | ICD-10-CM | POA: Diagnosis present

## 2018-06-29 DIAGNOSIS — E1169 Type 2 diabetes mellitus with other specified complication: Secondary | ICD-10-CM | POA: Diagnosis present

## 2018-06-29 LAB — CBC
HCT: 49.4 % (ref 39.0–52.0)
Hemoglobin: 15.8 g/dL (ref 13.0–17.0)
MCH: 27.6 pg (ref 26.0–34.0)
MCHC: 32 g/dL (ref 30.0–36.0)
MCV: 86.2 fL (ref 80.0–100.0)
Platelets: 302 10*3/uL (ref 150–400)
RBC: 5.73 MIL/uL (ref 4.22–5.81)
RDW: 13.2 % (ref 11.5–15.5)
WBC: 8.2 10*3/uL (ref 4.0–10.5)
nRBC: 0 % (ref 0.0–0.2)

## 2018-06-29 LAB — CBG MONITORING, ED
Glucose-Capillary: 245 mg/dL — ABNORMAL HIGH (ref 70–99)
Glucose-Capillary: 241 mg/dL — ABNORMAL HIGH (ref 70–99)

## 2018-06-29 LAB — BASIC METABOLIC PANEL
Anion gap: 13 (ref 5–15)
BUN: 31 mg/dL — ABNORMAL HIGH (ref 6–20)
CO2: 23 mmol/L (ref 22–32)
Calcium: 8.7 mg/dL — ABNORMAL LOW (ref 8.9–10.3)
Chloride: 101 mmol/L (ref 98–111)
Creatinine, Ser: 1.6 mg/dL — ABNORMAL HIGH (ref 0.61–1.24)
GFR calc Af Amer: 59 mL/min — ABNORMAL LOW (ref >60)
GFR calc non Af Amer: 51 mL/min — ABNORMAL LOW (ref >60)
Glucose, Bld: 215 mg/dL — ABNORMAL HIGH (ref 70–99)
Potassium: 3.5 mmol/L (ref 3.5–5.1)
Sodium: 137 mmol/L (ref 135–145)

## 2018-06-29 LAB — BRAIN NATRIURETIC PEPTIDE: B Natriuretic Peptide: 167 pg/mL — ABNORMAL HIGH (ref 0.0–100.0)

## 2018-06-29 LAB — I-STAT TROPONIN, ED: Troponin i, poc: 0.11 ng/mL (ref 0.00–0.08)

## 2018-06-29 LAB — ECHOCARDIOGRAM COMPLETE

## 2018-06-29 LAB — TROPONIN I: Troponin I: 0.12 ng/mL (ref <0.03)

## 2018-06-29 LAB — HEMOGLOBIN A1C
Hgb A1c MFr Bld: 9 % — ABNORMAL HIGH (ref 4.8–5.6)
Mean Plasma Glucose: 211.6 mg/dL

## 2018-06-29 MED ORDER — ACETAMINOPHEN 325 MG PO TABS: 650.0000 mg | ORAL_TABLET | Freq: Four times a day (QID) | ORAL | Status: DC | PRN

## 2018-06-29 MED ORDER — ACETAMINOPHEN 650 MG RE SUPP: 650.0000 mg | Freq: Four times a day (QID) | RECTAL | Status: DC | PRN

## 2018-06-29 MED ORDER — FUROSEMIDE 10 MG/ML IJ SOLN
40.0000 mg | Freq: Once | INTRAMUSCULAR | Status: AC
  Administered 2018-06-29: 40 mg via INTRAVENOUS
  Filled 2018-06-29: qty 4

## 2018-06-29 MED ORDER — INSULIN ASPART 100 UNIT/ML ~~LOC~~ SOLN: 0.0000 [IU] | Freq: Three times a day (TID) | SUBCUTANEOUS | Status: DC

## 2018-06-29 MED ORDER — IOPAMIDOL (ISOVUE-370) INJECTION 76%
INTRAVENOUS | Status: AC
  Administered 2018-06-29: 100 mL
  Filled 2018-06-29: qty 100

## 2018-06-29 MED ORDER — HYDRALAZINE HCL 20 MG/ML IJ SOLN
20.0000 mg | Freq: Once | INTRAMUSCULAR | Status: AC
  Administered 2018-06-29: 20 mg via INTRAVENOUS
  Filled 2018-06-29: qty 1

## 2018-06-29 MED ORDER — INSULIN ASPART PROT & ASPART (70-30 MIX) 100 UNIT/ML ~~LOC~~ SUSP
15.0000 [IU] | Freq: Two times a day (BID) | SUBCUTANEOUS | Status: DC
  Administered 2018-06-29: 15 [IU] via SUBCUTANEOUS
  Filled 2018-06-29: qty 10

## 2018-06-29 MED ORDER — SPIRONOLACTONE 25 MG PO TABS
25.0000 mg | ORAL_TABLET | Freq: Every day | ORAL | Status: DC
  Administered 2018-06-29: 25 mg via ORAL
  Filled 2018-06-29: qty 1

## 2018-06-29 MED ORDER — SODIUM CHLORIDE 0.9% FLUSH
3.0000 mL | Freq: Once | INTRAVENOUS | Status: AC
  Administered 2018-06-29: 3 mL via INTRAVENOUS

## 2018-06-29 MED ORDER — HYDRALAZINE HCL 25 MG PO TABS
100.0000 mg | ORAL_TABLET | Freq: Three times a day (TID) | ORAL | Status: DC
  Administered 2018-06-29: 100 mg via ORAL
  Filled 2018-06-29: qty 4

## 2018-06-29 MED ORDER — NICOTINE 14 MG/24HR TD PT24
14.0000 mg | MEDICATED_PATCH | Freq: Every day | TRANSDERMAL | Status: DC
  Administered 2018-06-29: 14 mg via TRANSDERMAL
  Filled 2018-06-29: qty 1

## 2018-06-29 MED ORDER — OXYCODONE HCL 5 MG PO TABS: 5.0000 mg | ORAL_TABLET | ORAL | Status: DC | PRN

## 2018-06-29 MED ORDER — ENOXAPARIN SODIUM 40 MG/0.4ML ~~LOC~~ SOLN
40.0000 mg | SUBCUTANEOUS | Status: DC
  Administered 2018-06-29: 40 mg via SUBCUTANEOUS
  Filled 2018-06-29: qty 0.4

## 2018-06-29 MED ORDER — CARVEDILOL 12.5 MG PO TABS
25.0000 mg | ORAL_TABLET | Freq: Two times a day (BID) | ORAL | Status: DC
  Administered 2018-06-29: 25 mg via ORAL
  Filled 2018-06-29: qty 2

## 2018-06-29 MED ORDER — ATORVASTATIN CALCIUM 40 MG PO TABS
40.0000 mg | ORAL_TABLET | Freq: Every day | ORAL | Status: DC
  Administered 2018-06-29: 40 mg via ORAL
  Filled 2018-06-29: qty 1

## 2018-06-29 MED ORDER — INSULIN DETEMIR 100 UNIT/ML FLEXPEN: 30.0000 [IU] | Freq: Every day | SUBCUTANEOUS | Status: DC

## 2018-06-29 MED ORDER — LABETALOL HCL 5 MG/ML IV SOLN: 10.0000 mg | INTRAVENOUS | Status: DC | PRN

## 2018-06-29 MED ORDER — LISINOPRIL 20 MG PO TABS
20.0000 mg | ORAL_TABLET | Freq: Every day | ORAL | Status: DC
  Administered 2018-06-29: 20 mg via ORAL
  Filled 2018-06-29: qty 1

## 2018-06-29 MED ORDER — FUROSEMIDE 20 MG PO TABS
40.0000 mg | ORAL_TABLET | Freq: Every day | ORAL | Status: DC
  Administered 2018-06-29: 40 mg via ORAL
  Filled 2018-06-29: qty 2

## 2018-06-29 MED ORDER — INSULIN ASPART 100 UNIT/ML ~~LOC~~ SOLN: 0.0000 [IU] | Freq: Every day | SUBCUTANEOUS | Status: DC

## 2018-06-29 MED ORDER — ALBUTEROL SULFATE (2.5 MG/3ML) 0.083% IN NEBU: 2.5000 mg | INHALATION_SOLUTION | RESPIRATORY_TRACT | Status: DC | PRN

## 2018-06-29 NOTE — ED Notes (Signed)
Pt came out of room asking to remove his IV. Pt stated he could not stay he had to leave. In report given from prior RN care she explained to me that she informed the pt on risk of leaving and she had paged the Dr. About him wanting to leave but had not heard back yet. I informed pt of risk of leaving and patient stated he has to work or bills wont get paid. IV removed and pt signed AMA sign out.

## 2018-06-29 NOTE — ED Notes (Signed)
EKG taken and given to EDP

## 2018-06-29 NOTE — ED Triage Notes (Signed)
Pt started having sob and R sided chest pain yesterday. Reports pain increases with inspiration. Hx of pneumonia reports pain is similar. Has had a nonproductive cough

## 2018-06-29 NOTE — ED Notes (Signed)
Pt ambulated to the bathroom.  

## 2018-06-29 NOTE — ED Notes (Addendum)
Pt stating that he would like to leave because he has "been waiting forever for a bed and I feel better. I don't want this IV in my arm anymore." This RN explained reasons for staying to patient. Patient continues to wish to leave. Will page hospitalist.

## 2018-06-29 NOTE — ED Notes (Signed)
Carb Modified Diet was ordered for Lunch. 

## 2018-06-29 NOTE — Progress Notes (Signed)
  Echocardiogram 2D Echocardiogram has been performed.  Matthew Odom 06/29/2018, 9:50 AM

## 2018-06-29 NOTE — ED Notes (Signed)
Pt reports pain to his chest while breathing, more so on the right side. Pt also reports the pain feels like a pressure or something sitting on his chest.

## 2018-06-29 NOTE — Discharge Summary (Signed)
I was asked by nursing staff whether I can prescribe him his blood pressure medications and insulin.  Patient earlier left AGAINST MEDICAL ADVICE.  He did not wait for this provider to talk. Unfortunately, I had no knowledge of patient leaving [place].  Unsure about this patient's compliance issues, will not prescribe long-term medications unless he establishes an outpatient provider.  I had advised him to find a community clinic for follow-up.

## 2018-06-29 NOTE — ED Notes (Signed)
Patient given urinal.

## 2018-06-29 NOTE — H&P (Signed)
History and Physical    Matthew Odom QTM:[id number] DOB: 04-22-1971 DOA: 06/29/2018  PCP: Patient, No Pcp Per  Patient coming from: home   I have personally briefly reviewed patient's old medical records available.   Chief Complaint: shortness of breath, right sided chest pain   HPI: Matthew Odom is a 48 y.o. male with medical history significant of uncontrolled type 2 diabetes on metformin, uncontrolled hypertension, CKD stage III, hyperlipidemia, ongoing smoker presents to the emergency room with 3 days of shortness of breath, high blood pressures and right-sided chest pain that is worse since last night.  Patient does have chronic hypertension, however he does not have a primary care physician and has been getting prescriptions from here and there and mostly from the emergency room visits.  His last A1c was 10, he was supposed to take insulin, however Levemir was too expensive so he could not afford it and is just taking metformin.  About 3 days, he has been feeling somehow shortness of breath.  Since last night, he had this right lower chest, pleuritic and sharp, continuous, moderate intensity chest pain, worse with coughing and Deep breathing, relieved with nothing that prompted him to come to ER.  He has dry cough without sputum production.  He thought he might have pneumonia. Denies any fever chills.  Denies any sick contacts.  Denies any flulike symptoms. ED Course: Hypertensive with blood pressure 216/149 in the emergency room.  On room air.  Creatinine 1.6 which is at about baseline.  Blood sugars 215.  Chest x-ray is with some increased interstitial markings.  CTA with no pneumonia no PE, prominent intervascular markings.  Given dose of hydralazine, labetalol, Lasix IV in the ER.  Point-of-care troponin is abnormal, EKG with no acute ischemic changes.  Patient is allergic to aspirin.  Review of Systems: As per HPI otherwise 10 point review of systems negative.    Past Medical History:    Diagnosis Date  . Cholesterosis   . Diabetes mellitus without complication ([place])   . Hypertension     History reviewed. No pertinent surgical history.   reports that he has been smoking cigarettes. He has been smoking about 1.00 pack per day. He has never used smokeless tobacco. He reports current alcohol use. He reports that he does not use drugs.  Allergies  Allergen Reactions  . Aspirin Hives  . Nitroglycerin Other (See Comments)    Nitroglycerin paste will make blood pressure go up and cause headaches. The nitroglycerin tablets do fine.    History reviewed. No pertinent family history.   Prior to Admission medications   Medication Sig Start Date End Date Taking? Authorizing Provider  atorvastatin (LIPITOR) 40 MG tablet Take 40 mg by mouth daily.   Yes [provider]  carvedilol (COREG) 25 MG tablet Take 1 tablet (25 mg total) by mouth 2 (two) times daily with a meal. 01/04/18  Yes Charlynne Cousins, MD  hydrALAZINE (APRESOLINE) 100 MG tablet Take 1 tablet (100 mg total) by mouth 3 (three) times daily. 01/04/18  Yes Charlynne Cousins, MD  lisinopril (PRINIVIL,ZESTRIL) 5 MG tablet Take 2 tablets (10 mg total) by mouth daily. 01/04/18 01/04/19 Yes Charlynne Cousins, MD  metFORMIN (GLUCOPHAGE) 500 MG tablet Take 2 tablets (1,000 mg total) by mouth 2 (two) times daily with a meal. 01/04/18  Yes Charlynne Cousins, MD  spironolactone (ALDACTONE) 25 MG tablet Take 1 tablet (25 mg total) by mouth daily. 01/04/18  Yes Charlynne Cousins, MD  insulin detemir (LEVEMIR) 100 unit/ml SOLN Inject 0.4 mLs (40 Units total) into the skin daily. 01/04/18   Charlynne Cousins, MD  Insulin Pen Needle 31G X 6 MM MISC 1 Device by Does not apply route 2 (two) times daily. 01/04/18   Charlynne Cousins, MD    Physical Exam: Vitals:   06/29/18 0720 06/29/18 0726 06/29/18 0730 06/29/18 0745  BP:  (!) 173/93 (!) 185/81 (!) 154/89  Pulse:  97 99 93  Resp:  (!) 23 17 (!) 23  Temp:       SpO2: 99% 96% 97% 96%    Constitutional: NAD, calm, comfortable Vitals:   06/29/18 0720 06/29/18 0726 06/29/18 0730 06/29/18 0745  BP:  (!) 173/93 (!) 185/81 (!) 154/89  Pulse:  97 99 93  Resp:  (!) 23 17 (!) 23  Temp:      SpO2: 99% 96% 97% 96%   Eyes: PERRL, lids and conjunctivae normal On room air.  Anxious. ENMT: Mucous membranes are moist. Posterior pharynx clear of any exudate or lesions.Normal dentition.  Neck: normal, supple, no masses, no thyromegaly Respiratory: clear to auscultation bilaterally, no wheezing, no crackles. Normal respiratory effort. No accessory muscle use.  No reproducible tenderness. Cardiovascular: Regular rate and rhythm, no murmurs / rubs / gallops. No extremity edema. 2+ pedal pulses. No carotid bruits.  Abdomen: no tenderness, no masses palpated. No hepatosplenomegaly. Bowel sounds positive.  Musculoskeletal: no clubbing / cyanosis. No joint deformity upper and lower extremities. Good ROM, no contractures. Normal muscle tone.  Skin: no rashes, lesions, ulcers. No induration Neurologic: CN 2-12 grossly intact. Sensation intact, DTR normal. Strength 5/5 in all 4.  Psychiatric: Normal judgment and insight. Alert and oriented x 3.  Anxious.    Labs on Admission: I have personally reviewed following labs and imaging studies  CBC: Recent Labs  Lab 06/29/18 0343  WBC 8.2  HGB 15.8  HCT 49.4  MCV 86.2  PLT 824   Basic Metabolic Panel: Recent Labs  Lab 06/29/18 0343  NA 137  K 3.5  CL 101  CO2 23  GLUCOSE 215*  BUN 31*  CREATININE 1.60*  CALCIUM 8.7*   GFR: CrCl cannot be calculated (Unknown ideal weight.). Liver Function Tests: No results for input(s): AST, ALT, ALKPHOS, BILITOT, PROT, ALBUMIN in the last 168 hours. No results for input(s): LIPASE, AMYLASE in the last 168 hours. No results for input(s): AMMONIA in the last 168 hours. Coagulation Profile: No results for input(s): INR, PROTIME in the last 168 hours. Cardiac  Enzymes: No results for input(s): CKTOTAL, CKMB, CKMBINDEX, TROPONINI in the last 168 hours. BNP (last 3 results) No results for input(s): PROBNP in the last 8760 hours. HbA1C: No results for input(s): HGBA1C in the last 72 hours. CBG: No results for input(s): GLUCAP in the last 168 hours. Lipid Profile: No results for input(s): CHOL, HDL, LDLCALC, TRIG, CHOLHDL, LDLDIRECT in the last 72 hours. Thyroid Function Tests: No results for input(s): TSH, T4TOTAL, FREET4, T3FREE, THYROIDAB in the last 72 hours. Anemia Panel: No results for input(s): VITAMINB12, FOLATE, FERRITIN, TIBC, IRON, RETICCTPCT in the last 72 hours. Urine analysis: No results found for: COLORURINE, APPEARANCEUR, LABSPEC, PHURINE, GLUCOSEU, HGBUR, BILIRUBINUR, KETONESUR, PROTEINUR, UROBILINOGEN, NITRITE, LEUKOCYTESUR  Radiological Exams on Admission: Dg Chest 2 View  Result Date: 06/29/2018 CLINICAL DATA:  48 year old male with chest pain. EXAM: CHEST - 2 VIEW COMPARISON:  Chest radiograph dated 01/02/2018 FINDINGS: The lungs are clear. There is no pleural effusion or pneumothorax. Stable cardiomegaly. No  acute osseous pathology. IMPRESSION: 1. No acute cardiopulmonary process. 2. Stable cardiomegaly. Electronically Signed   By: Anner Crete M.D.   On: 06/29/2018 03:56   Ct Angio Chest Pe W Or Wo Contrast  Result Date: 06/29/2018 CLINICAL DATA:  48 y/o M; right-sided chest pain with shortness of breath and dyspnea. EXAM: CT ANGIOGRAPHY CHEST WITH CONTRAST TECHNIQUE: Multidetector CT imaging of the chest was performed using the standard protocol during bolus administration of intravenous contrast. Multiplanar CT image reconstructions and MIPs were obtained to evaluate the vascular anatomy. CONTRAST:  80 cc ISOVUE-370 IOPAMIDOL (ISOVUE-370) INJECTION 76% COMPARISON:  06/29/2018 chest radiograph FINDINGS: Cardiovascular: Moderate to severe cardiomegaly with left ventricular hypertrophy. Ascending aorta measures 4.2 cm (series  8, image 59). Satisfactory opacification of the pulmonary arteries. No pulmonary embolus identified. No pericardial effusion. Mediastinum/Nodes: No enlarged mediastinal, hilar, or axillary lymph nodes. Thyroid gland, trachea, and esophagus demonstrate no significant findings. Lungs/Pleura: Interlobular septal thickening and Kerley B-lines. No consolidation, effusion, or pneumothorax. Upper Abdomen: No acute abnormality. Musculoskeletal: No chest wall abnormality. No acute or significant osseous findings. Review of the MIP images confirms the above findings. IMPRESSION: 1. No pulmonary embolus identified. 2. Interstitial pulmonary edema. 3. Ascending aortic aneurysm measuring 4.2 cm. Recommend annual imaging followup by CTA or MRA. This recommendation follows 2010 ACCF/AHA/AATS/ACR/ASA/SCA/SCAI/SIR/STS/SVM Guidelines for the Diagnosis and Management of Patients with Thoracic Aortic Disease. 2010; 121: T517-O160. 4. Moderate to severe cardiomegaly with left ventricular hypertrophy. Electronically Signed   By: Kristine Garbe M.D.   On: 06/29/2018 05:59    EKG: Independently reviewed. Sinus tachycardia with PVCs.  Assessment/Plan Principal Problem:   Hypertensive urgency Active Problems:   HTN (hypertension)   Diabetes (HCC)   Hyperlipemia   Nicotine abuse   CKD (chronic kidney disease), stage III (HCC)   Chest pain   Troponin level elevated     1. Hypertensive urgency: Agree with admission given severity of symptoms.  Also suspect noncompliance due to not having a provider and taking regular medications. Blood pressures are uncontrolled.  Will resume all home medications.  Will keep patient on as needed IV and oral antihypertensives to control blood pressures to keep systolic blood pressure less than 180.  Will need further up titration up blood pressure medications. We will increase dose of lisinopril.  2.  Chest pain: Atypical right-sided chest pain.  PE ruled out.  Symptomatic  treatment.  Smoker with dry cough, no active wheezing, will treat with bronchodilator therapy and Tylenol.  3.  Elevated troponin: Borderline elevated troponin with uncontrolled blood pressures.  Suspect type II injury.  We will continue to trend troponin, EKG, monitor in telemetry unit to rule out acute coronary syndrome.  Patient is allergic to aspirin.  Will hold off on given any aspirin.  4.  Shortness of breath: Suspect diastolic dysfunction.  Patient is not on diuretics.  Given dose of Lasix in the ER.  Will start patient on Lasix 40 mg once a day and monitor.  5.  Chronic kidney disease stage III: More or less at his baseline.  Will monitor on treatment.  6.  Uncontrolled type 2 diabetes: Only on metformin due to unable to afford.  Last A1c was 11.  Will recheck A1c.  Will resume insulin.  Case management consult.  On discharge, he will need 70/30 insulin and may need prescription support. Will start him on 70/30 insulin to help with transition at home.  Keep on sliding scale insulin.  7.  Smoker: Patient continues to smoke.  Patient is willing to quit the smoking.  We discussed in brief about adverse health outcomes with continuous smoking.  Counseling provided.  Patient will be provided with written counseling and instructions for smoking cessation. Nicotine patch will be prescribed. His wife also smokes, I encouraged both of them to quit his smoking.   DVT prophylaxis: Lovenox subcu. Code Status: Full code.   Family Communication: Wife at bedside. Disposition Plan: Home. Consults called: None. Admission status: Observation.   Barb Merino MD [facility] Pager [phone]  If 7PM-7AM, please contact night-coverage [email or web address] Password [facility]  06/29/2018, 8:24 AM

## 2018-06-29 NOTE — ED Notes (Signed)
CN notified about istat trop results.

## 2018-06-29 NOTE — ED Provider Notes (Signed)
[facility] EMERGENCY DEPARTMENT Provider Note   CSN: [id number] Arrival date & time: 06/29/18  9628    History   Chief Complaint Chief Complaint  Patient presents with  . Chest Pain  . Shortness of Breath    HPI Matthew Odom is a 48 y.o. male.     Patient presents to the emergency department for evaluation of chest pain and shortness of breath.  Patient reports that symptoms began yesterday.  He has been experiencing pain in the right chest that worsens when he takes a deep breath.  He reports that he feels more short of breath if he lies on his right side.  He has had a cough that is nonproductive.  He reports that his pain and breathing feels like when he had pneumonia several years ago.  He describes it as a heaviness across his chest.  Patient is noted to be very hypertensive at arrival.  He reports that his hypertension is chronic.  He states that his doctors changed his medications sometime ago when his blood pressure has been poorly controlled ever since.     Past Medical History:  Diagnosis Date  . Cholesterosis   . Diabetes mellitus without complication ([place])   . Hypertension     Patient Active Problem List   Diagnosis Date Noted  . CKD (chronic kidney disease), stage III ([place]) 01/03/2018  . HTN (hypertension) 01/02/2018  . Diabetes ([place]) 01/02/2018  . Acute respiratory failure ([place]) 01/02/2018  . Acute respiratory failure with hypoxia ([place]) 01/02/2018  . Hyperlipemia 01/02/2018  . Hypertensive urgency 01/02/2018  . Abnormal EKG 01/02/2018  . COPD with acute bronchitis ([place]) 01/02/2018  . Leg pain 01/02/2018  . Nicotine abuse 01/02/2018    History reviewed. No pertinent surgical history.      Home Medications    Prior to Admission medications   Medication Sig Start Date End Date Taking? Authorizing Provider  atorvastatin (LIPITOR) 40 MG tablet Take 40 mg by mouth daily.   Yes [provider]  carvedilol (COREG) 25 MG  tablet Take 1 tablet (25 mg total) by mouth 2 (two) times daily with a meal. 01/04/18  Yes Charlynne Cousins, MD  hydrALAZINE (APRESOLINE) 100 MG tablet Take 1 tablet (100 mg total) by mouth 3 (three) times daily. 01/04/18  Yes Charlynne Cousins, MD  lisinopril (PRINIVIL,ZESTRIL) 5 MG tablet Take 2 tablets (10 mg total) by mouth daily. 01/04/18 01/04/19 Yes Charlynne Cousins, MD  metFORMIN (GLUCOPHAGE) 500 MG tablet Take 2 tablets (1,000 mg total) by mouth 2 (two) times daily with a meal. 01/04/18  Yes Charlynne Cousins, MD  spironolactone (ALDACTONE) 25 MG tablet Take 1 tablet (25 mg total) by mouth daily. 01/04/18  Yes Charlynne Cousins, MD  doxycycline (VIBRA-TABS) 100 MG tablet Take 1 tablet (100 mg total) by mouth every 12 (twelve) hours. Patient not taking: Reported on 06/29/2018 01/04/18   Charlynne Cousins, MD  insulin detemir (LEVEMIR) 100 unit/ml SOLN Inject 0.4 mLs (40 Units total) into the skin daily. 01/04/18   Charlynne Cousins, MD  Insulin Pen Needle 31G X 6 MM MISC 1 Device by Does not apply route 2 (two) times daily. 01/04/18   Charlynne Cousins, MD    Family History No family history on file.  Social History Social History   Tobacco Use  . Smoking status: Current Every Day Smoker    Packs/day: 1.00    Types: Cigarettes  . Smokeless tobacco: Never Used  Substance  Use Topics  . Alcohol use: Yes    Comment: occasionally  . Drug use: Never     Allergies   Aspirin and Nitroglycerin   Review of Systems Review of Systems  Respiratory: Positive for chest tightness and shortness of breath.   All other systems reviewed and are negative.    Physical Exam Updated Vital Signs BP (!) 185/127   Pulse 94   Temp 98.2 F (36.8 C)   Resp 20   SpO2 (!) 71%   Physical Exam Vitals signs and nursing note reviewed.  Constitutional:      General: He is not in acute distress.    Appearance: Normal appearance. He is well-developed.  HENT:     Head: Normocephalic and  atraumatic.     Right Ear: Hearing normal.     Left Ear: Hearing normal.     Nose: Nose normal.  Eyes:     Conjunctiva/sclera: Conjunctivae normal.     Pupils: Pupils are equal, round, and reactive to light.  Neck:     Musculoskeletal: Normal range of motion and neck supple.  Cardiovascular:     Rate and Rhythm: Regular rhythm.     Heart sounds: S1 normal and S2 normal. No murmur. No friction rub. No gallop.   Pulmonary:     Effort: Pulmonary effort is normal. No respiratory distress.     Breath sounds: Normal breath sounds.  Chest:     Chest wall: No tenderness.  Abdominal:     General: Bowel sounds are normal.     Palpations: Abdomen is soft.     Tenderness: There is no abdominal tenderness. There is no guarding or rebound. Negative signs include Murphy's sign and McBurney's sign.     Hernia: No hernia is present.  Musculoskeletal: Normal range of motion.  Skin:    General: Skin is warm and dry.     Findings: No rash.  Neurological:     Mental Status: He is alert and oriented to person, place, and time.     GCS: GCS eye subscore is 4. GCS verbal subscore is 5. GCS motor subscore is 6.     Cranial Nerves: No cranial nerve deficit.     Sensory: No sensory deficit.     Coordination: Coordination normal.  Psychiatric:        Speech: Speech normal.        Behavior: Behavior normal.        Thought Content: Thought content normal.      ED Treatments / Results  Labs (all labs ordered are listed, but only abnormal results are displayed) Labs Reviewed  BASIC METABOLIC PANEL - Abnormal; Notable for the following components:      Result Value   Glucose, Bld 215 (*)    BUN 31 (*)    Creatinine, Ser 1.60 (*)    Calcium 8.7 (*)    GFR calc non Af Amer 51 (*)    GFR calc Af Amer 59 (*)    All other components within normal limits  I-STAT TROPONIN, ED - Abnormal; Notable for the following components:   Troponin i, poc 0.11 (*)    All other components within normal limits  CBC   BRAIN NATRIURETIC PEPTIDE    EKG EKG Interpretation  Date/Time:  Tuesday June 29 2018 03:36:39 EST Ventricular Rate:  93 PR Interval:  166 QRS Duration: 120 QT Interval:  404 QTC Calculation: 502 R Axis:   -156 Text Interpretation:  Normal sinus rhythm Possible Left atrial  enlargement Right superior axis deviation Possible Anterior infarct , age undetermined Abnormal ECG No significant change since last tracing Confirmed by Orpah Greek [phone]) on 06/29/2018 4:54:28 AM   Radiology Dg Chest 2 View  Result Date: 06/29/2018 CLINICAL DATA:  48 year old male with chest pain. EXAM: CHEST - 2 VIEW COMPARISON:  Chest radiograph dated 01/02/2018 FINDINGS: The lungs are clear. There is no pleural effusion or pneumothorax. Stable cardiomegaly. No acute osseous pathology. IMPRESSION: 1. No acute cardiopulmonary process. 2. Stable cardiomegaly. Electronically Signed   By: Anner Crete M.D.   On: 06/29/2018 03:56   Ct Angio Chest Pe W Or Wo Contrast  Result Date: 06/29/2018 CLINICAL DATA:  49 y/o M; right-sided chest pain with shortness of breath and dyspnea. EXAM: CT ANGIOGRAPHY CHEST WITH CONTRAST TECHNIQUE: Multidetector CT imaging of the chest was performed using the standard protocol during bolus administration of intravenous contrast. Multiplanar CT image reconstructions and MIPs were obtained to evaluate the vascular anatomy. CONTRAST:  80 cc ISOVUE-370 IOPAMIDOL (ISOVUE-370) INJECTION 76% COMPARISON:  06/29/2018 chest radiograph FINDINGS: Cardiovascular: Moderate to severe cardiomegaly with left ventricular hypertrophy. Ascending aorta measures 4.2 cm (series 8, image 59). Satisfactory opacification of the pulmonary arteries. No pulmonary embolus identified. No pericardial effusion. Mediastinum/Nodes: No enlarged mediastinal, hilar, or axillary lymph nodes. Thyroid gland, trachea, and esophagus demonstrate no significant findings. Lungs/Pleura: Interlobular septal thickening and  Kerley B-lines. No consolidation, effusion, or pneumothorax. Upper Abdomen: No acute abnormality. Musculoskeletal: No chest wall abnormality. No acute or significant osseous findings. Review of the MIP images confirms the above findings. IMPRESSION: 1. No pulmonary embolus identified. 2. Interstitial pulmonary edema. 3. Ascending aortic aneurysm measuring 4.2 cm. Recommend annual imaging followup by CTA or MRA. This recommendation follows 2010 ACCF/AHA/AATS/ACR/ASA/SCA/SCAI/SIR/STS/SVM Guidelines for the Diagnosis and Management of Patients with Thoracic Aortic Disease. 2010; 121: J335-K562. 4. Moderate to severe cardiomegaly with left ventricular hypertrophy. Electronically Signed   By: Kristine Garbe M.D.   On: 06/29/2018 05:59    Procedures Procedures (including critical care time)  Medications Ordered in ED Medications  hydrALAZINE (APRESOLINE) injection 20 mg (has no administration in time range)  furosemide (LASIX) injection 40 mg (has no administration in time range)  sodium chloride flush (NS) 0.9 % injection 3 mL (3 mLs Intravenous Given 06/29/18 0451)  iopamidol (ISOVUE-370) 76 % injection (100 mLs  Contrast Given 06/29/18 0521)  hydrALAZINE (APRESOLINE) injection 20 mg (20 mg Intravenous Given 06/29/18 0557)     Initial Impression / Assessment and Plan / ED Course  I have reviewed the triage vital signs and the nursing notes.  Pertinent labs & imaging results that were available during my care of the patient were reviewed by me and considered in my medical decision making (see chart for details).        Patient presents to the emergency department for evaluation of chest pain and shortness of breath.  This began yesterday.  Patient noted to have markedly elevated blood pressure at arrival.  He does have a history of refractory hypertension.  He reports that he takes his medications as prescribed but his blood pressure stays high.  Pain is on the right side of his chest that  is pleuritic.  He had a mildly elevated troponin at arrival.  This is felt to be leak secondary to hypertensive urgency.  Record review revealed a similar presentation to [facility] in 2017.  During that hospital stay he had a heart catheterization:     Left main: Normal   LAD:  Normal   DIAG: Normal   LCX huge. Normal   OM: Normal   RCA: 20-30%   PDA/PLB: Normal      Bypass grafts: None   Left ventriculogram: 45% with global hypokinesia LVEDP 25   He also had an echo during that hospitalization which showed: The left ventricle is mildly dilated. There is moderate concentric left ventricular hypertrophy. There is mild diffuse hypokinesis of the left ventricle. The left ventricular ejection fraction is mildly reduced (45-50%). Grade II moderate diastolic dysfunction; pseudonormal mitral inflow pattern. The left atrium is mildly dilated. Estimation of right ventricular systolic pressure is not possible.  Patient underwent CT angiography here that did not show any evidence of pulmonary embolism but he does have interstitial edema consistent with decompensated heart failure.  This is likely secondary to his hypertensive urgency.  This was treated with IV hydralazine with sequential improvement with doses.  Will require hospitalization for further management of his blood pressure and congestive heart failure.  Patient administered 40 mg of IV Lasix here in the ER.  He does not take Lasix daily.  CRITICAL CARE Performed by: Orpah Greek   Total critical care time: 35 minutes  Critical care time was exclusive of separately billable procedures and treating other patients.  Critical care was necessary to treat or prevent imminent or life-threatening deterioration.  Critical care was time spent personally by me on the following activities: development of treatment plan with patient and/or surrogate as well as nursing, discussions with consultants, evaluation of patient's  response to treatment, examination of patient, obtaining history from patient or surrogate, ordering and performing treatments and interventions, ordering and review of laboratory studies, ordering and review of radiographic studies, pulse oximetry and re-evaluation of patient's condition.    Final Clinical Impressions(s) / ED Diagnoses   Final diagnoses:  Hypertensive urgency    ED Discharge Orders    None       Orpah Greek, MD 06/29/18 [phone]

## 2018-07-01 ENCOUNTER — Telehealth: Payer: Self-pay | Admitting: *Deleted

## 2018-07-01 NOTE — Telephone Encounter (Signed)
Pt significant other Ronny Bacon) called regarding not receiving Rx for patient upon discharge from ED.  EDCM reviewed chart to find pt left AMA and was NOT prescribed Rx.  EDCM explained to [facility] that when pt leaves AMA, a Rx is not written.  Advised to see PCP for any Rx needed.  No further EDCM needed.

## 2018-09-27 DIAGNOSIS — E119 Type 2 diabetes mellitus without complications: Secondary | ICD-10-CM | POA: Insufficient documentation

## 2019-01-20 ENCOUNTER — Emergency Department (HOSPITAL_COMMUNITY)
Admission: EM | Admit: 2019-01-20 | Discharge: 2019-01-21 | Discharge status: Against Medical Advice | Payer: BLUE CROSS/BLUE SHIELD | Attending: Emergency Medicine | Admitting: Emergency Medicine

## 2019-01-20 ENCOUNTER — Other Ambulatory Visit: Payer: Self-pay

## 2019-01-20 ENCOUNTER — Encounter (HOSPITAL_COMMUNITY): Payer: Self-pay | Admitting: Emergency Medicine

## 2019-01-20 DIAGNOSIS — Z5321 Procedure and treatment not carried out due to patient leaving prior to being seen by health care provider: Secondary | ICD-10-CM | POA: Insufficient documentation

## 2019-01-20 NOTE — ED Triage Notes (Signed)
Pt to ED with c/o lower back pain after falling earlier today.  Pt st's he slipped on wet floor. Pt denies any numbness or tingling in legs

## 2019-01-21 NOTE — ED Notes (Signed)
Pt handed over labels and BP cuff to staff and stated he was leaving.

## 2019-01-31 ENCOUNTER — Ambulatory Visit: Payer: Self-pay | Attending: Nurse Practitioner | Admitting: Nurse Practitioner

## 2019-05-06 DIAGNOSIS — I2510 Atherosclerotic heart disease of native coronary artery without angina pectoris: Secondary | ICD-10-CM

## 2019-05-06 HISTORY — DX: Atherosclerotic heart disease of native coronary artery without angina pectoris: I25.10

## 2019-08-30 IMAGING — DX DG LUMBAR SPINE COMPLETE 4+V
5 series · 5 of 5 positions shown · non-contrast
Comparison: None.

CLINICAL DATA: Pain following fall

EXAM:
LUMBAR SPINE - COMPLETE 4+ VIEW

[l-spine ap]
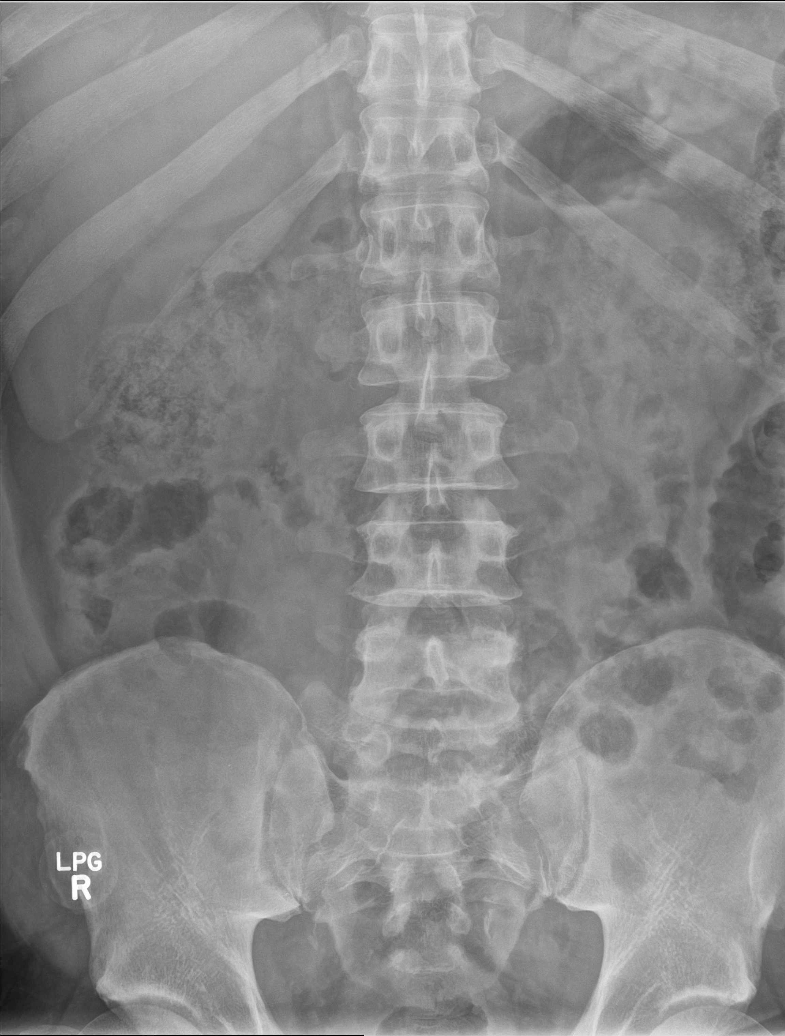

[l-spine obl (1 of 2)]
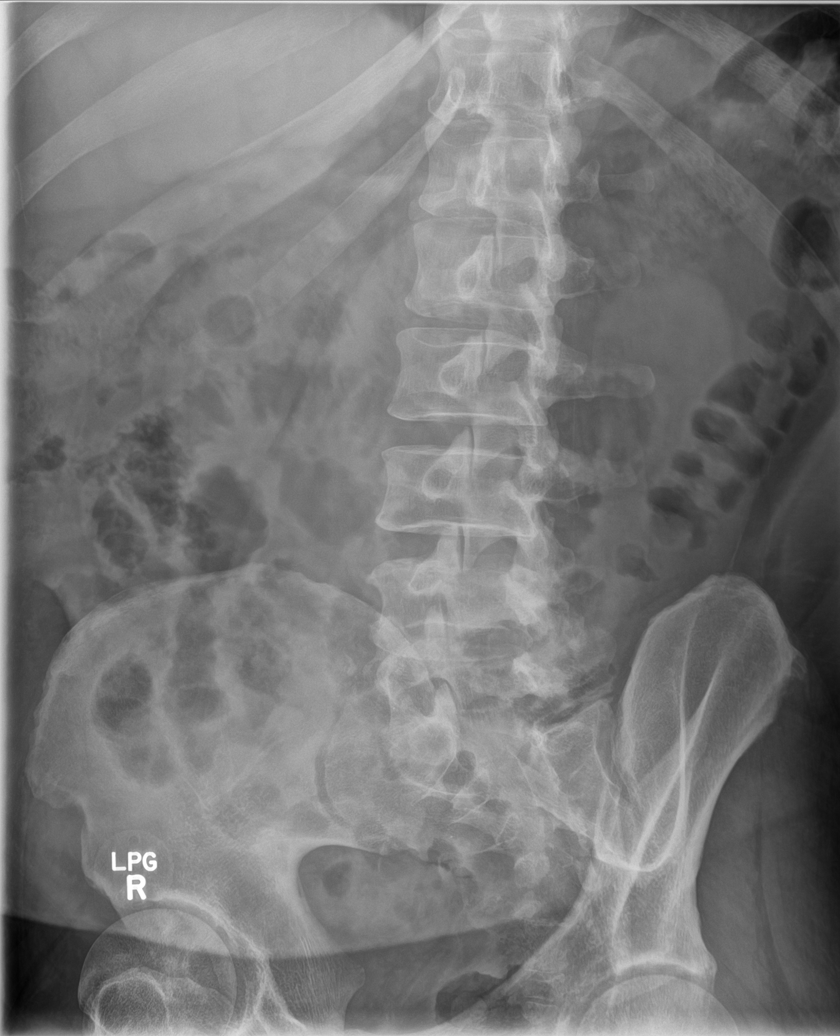

[l-spine obl (2 of 2)]
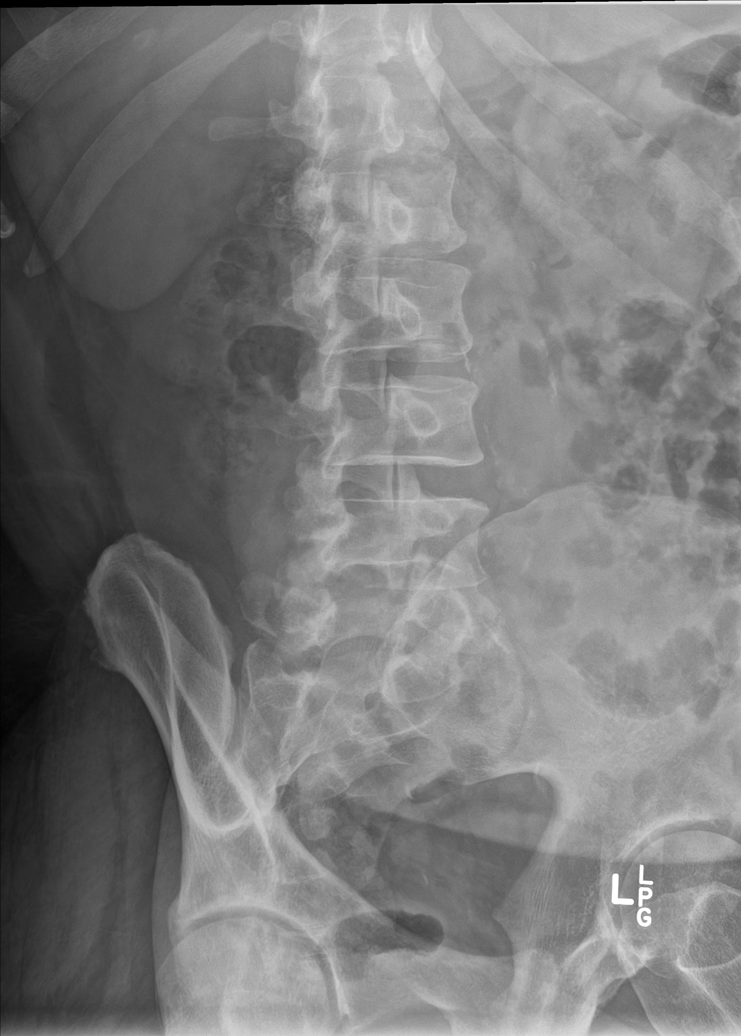

[l-spine lat]
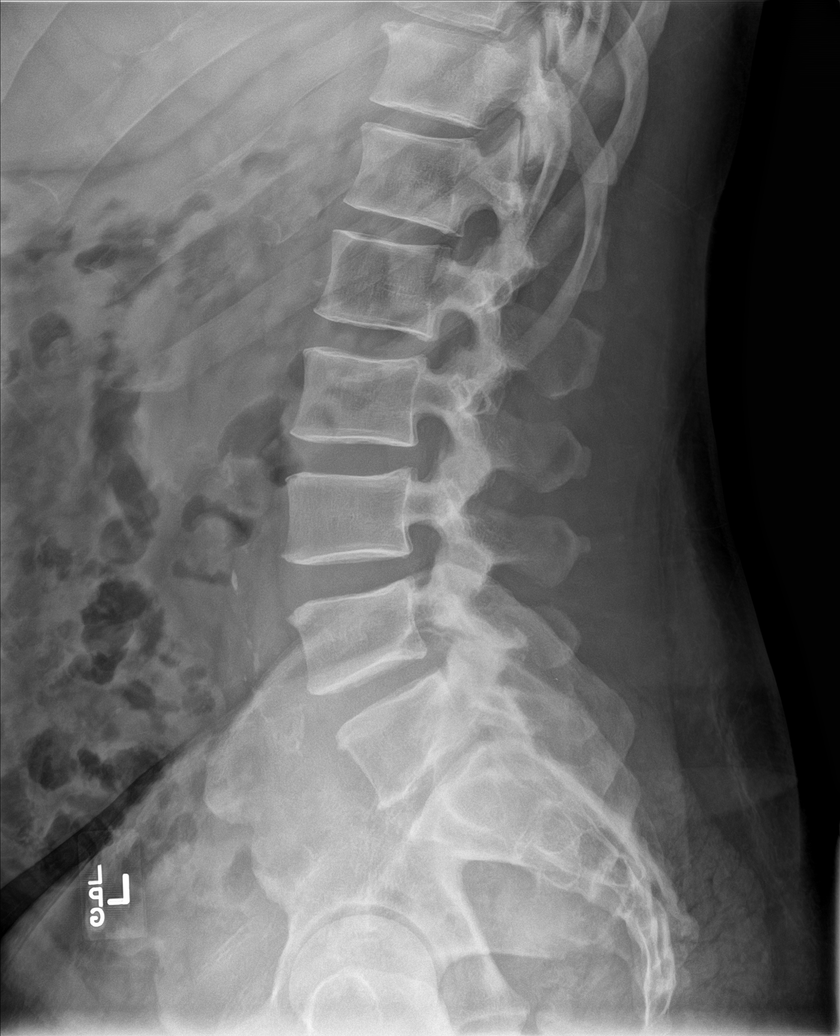

[l-spine spot]
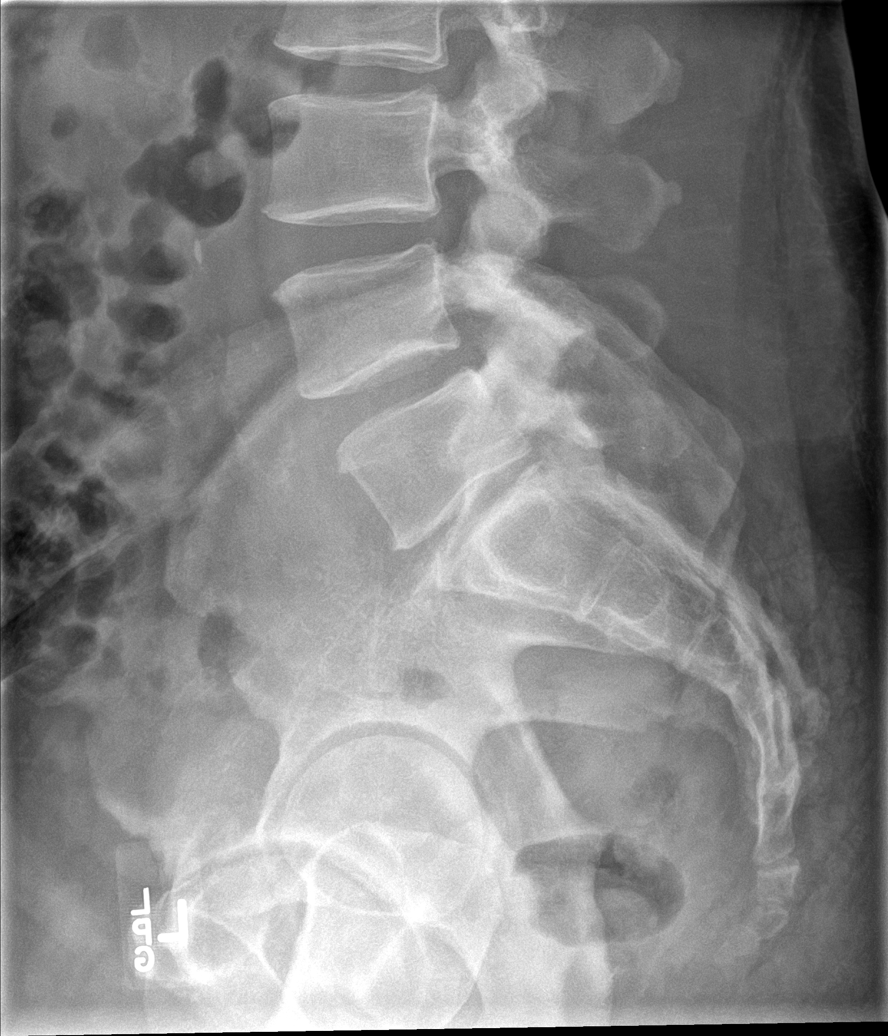

[5 of 5 positions shown; findings below may reference images not displayed]

FINDINGS: Frontal, lateral, spot lumbosacral lateral, and bilateral oblique
views were obtained. There are 5 non-rib-bearing lumbar type
vertebral bodies. T12 ribs are hypoplastic. There is no evident
fracture or spondylolisthesis. There is slight disc space narrowing
at L5-S1. Other disc spaces appear unremarkable. There is no
appreciable facet arthropathy. There is aortic atherosclerosis.
IMPRESSION: Slight disc space narrowing at L5-S1. Other disc spaces appear
normal. No fracture or spondylolisthesis. No appreciable facet
arthropathy. There is aortic atherosclerosis.

## 2019-09-30 IMAGING — CR DG CHEST 2V
2 series · 2 of 2 positions shown · non-contrast
Comparison: None.

CLINICAL DATA: Acute onset productive cough and shortness of
breath. Current history of diabetes and hypertension. Current
smoker.

EXAM:
CHEST - 2 VIEW

[w chest pa]
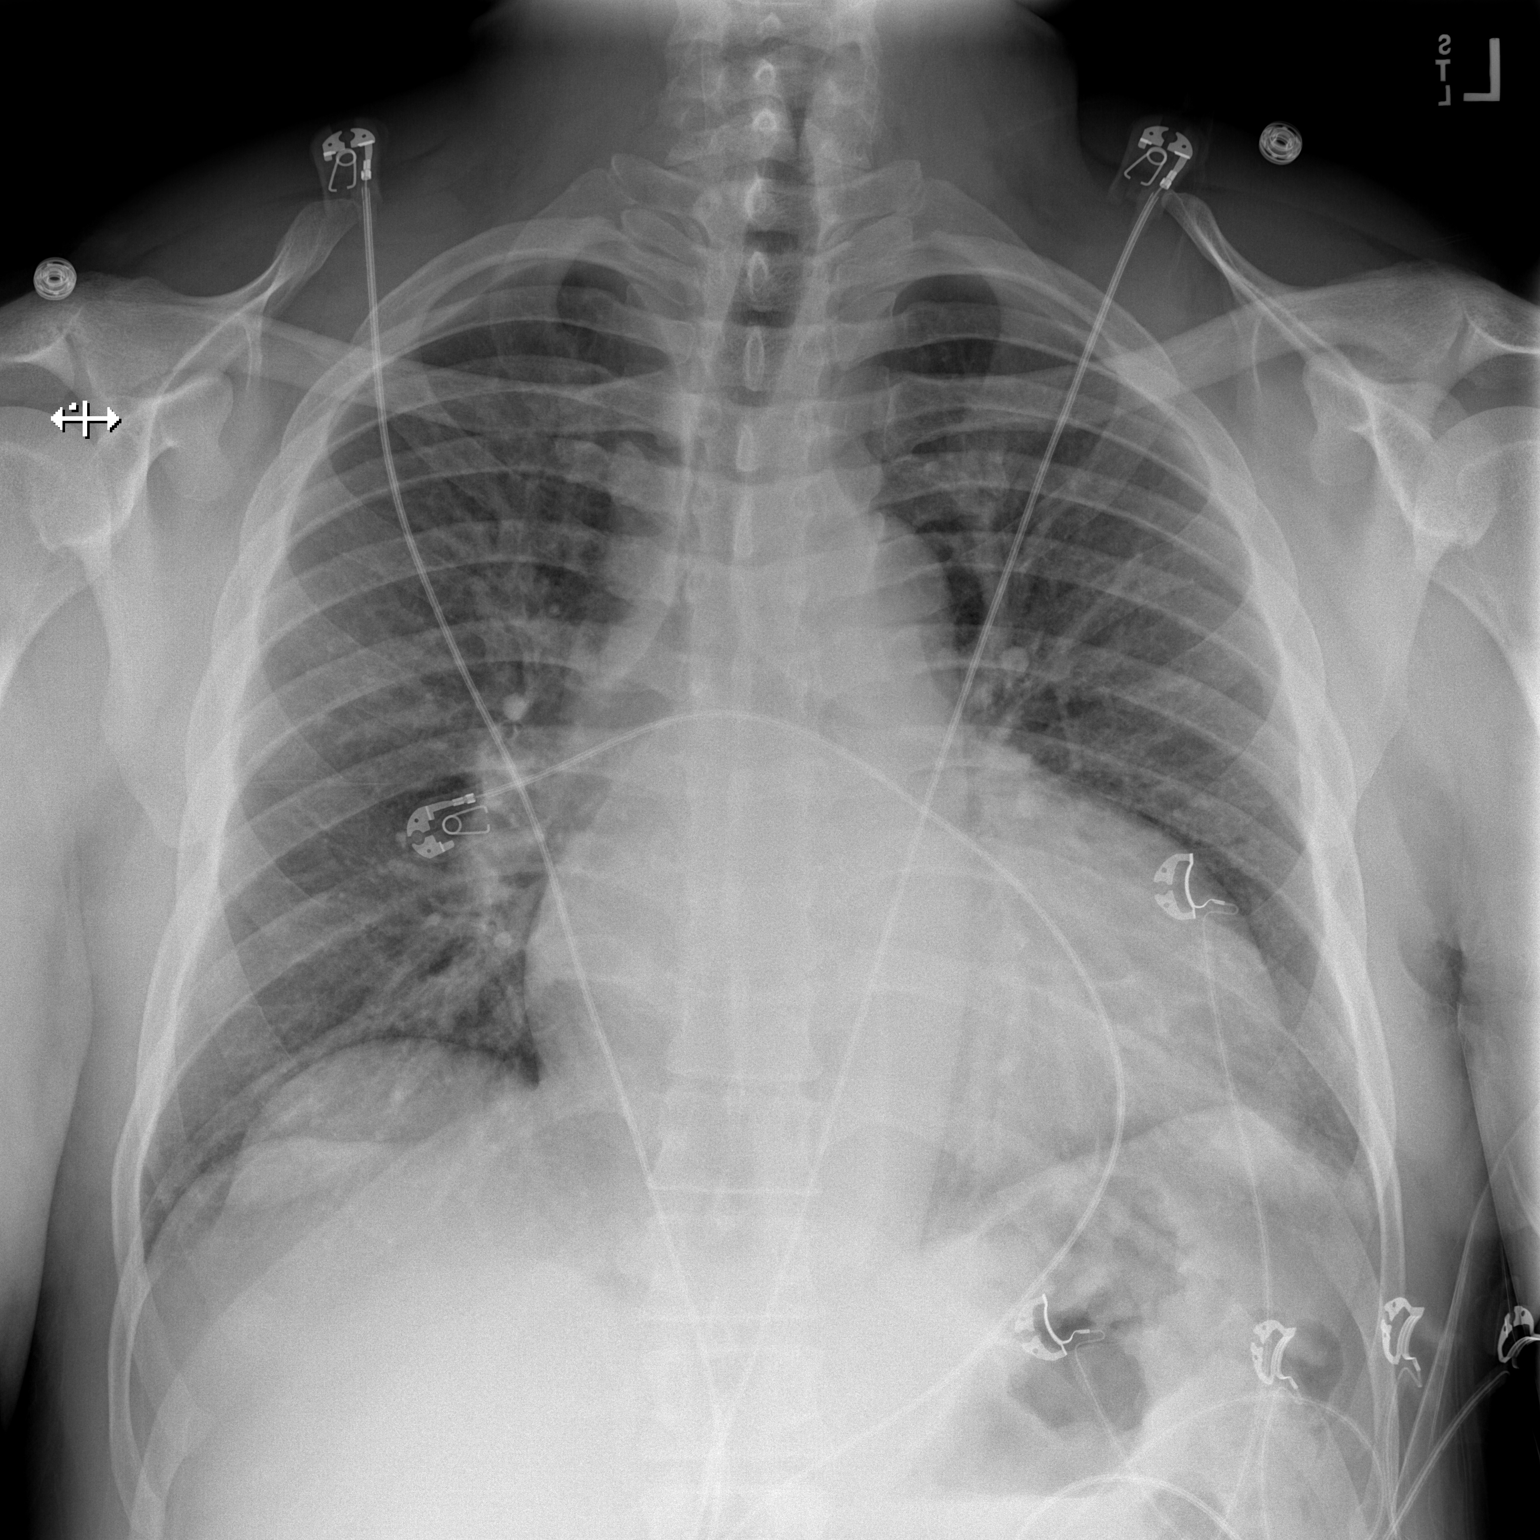

[w chest lat]
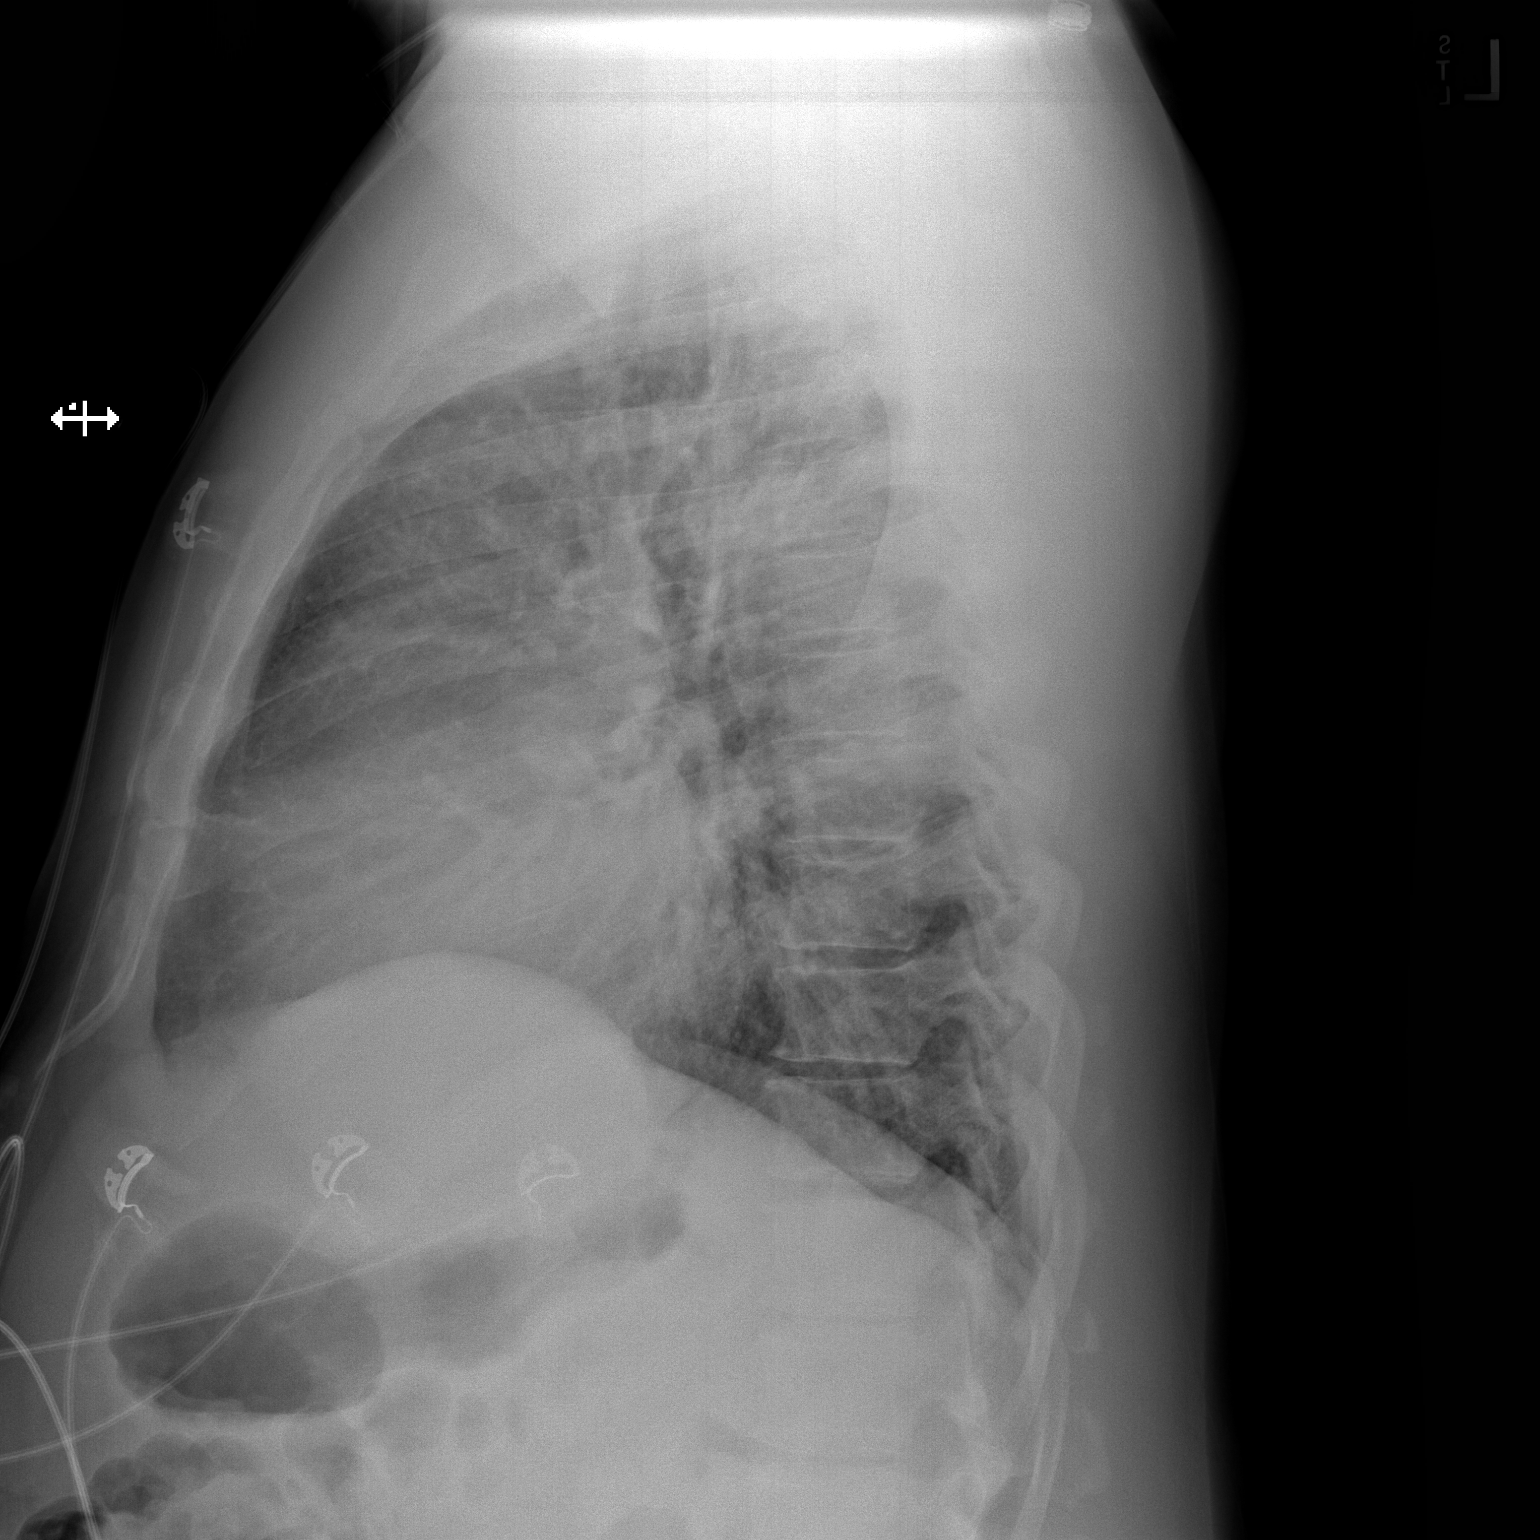

[2 of 2 positions shown; findings below may reference images not displayed]

FINDINGS: Cardiac silhouette markedly enlarged. Hilar and mediastinal contours
otherwise unremarkable. Mild pulmonary venous hypertension without
overt edema. Lungs clear. Bronchovascular markings normal. No
visible pleural effusions. No pneumothorax. Visualized bony thorax
intact.
IMPRESSION: Marked cardiomegaly. Pulmonary venous hypertension without overt
edema. No acute cardiopulmonary disease.

## 2019-10-07 ENCOUNTER — Other Ambulatory Visit: Payer: Self-pay

## 2019-10-07 ENCOUNTER — Encounter: Payer: Self-pay | Admitting: Nurse Practitioner

## 2019-10-07 ENCOUNTER — Ambulatory Visit: Payer: Self-pay | Attending: Nurse Practitioner | Admitting: Nurse Practitioner

## 2019-10-07 ENCOUNTER — Other Ambulatory Visit: Payer: Self-pay | Admitting: Nurse Practitioner

## 2019-10-07 VITALS — BP 220/135 | HR 97 | Temp 97.7°F | Ht 70.47 in | Wt 245.0 lb

## 2019-10-07 DIAGNOSIS — Z130 Encounter for screening for diseases of the blood and blood-forming organs and certain disorders involving the immune mechanism: Secondary | ICD-10-CM

## 2019-10-07 DIAGNOSIS — I10 Essential (primary) hypertension: Secondary | ICD-10-CM

## 2019-10-07 DIAGNOSIS — Z794 Long term (current) use of insulin: Secondary | ICD-10-CM

## 2019-10-07 DIAGNOSIS — E1169 Type 2 diabetes mellitus with other specified complication: Secondary | ICD-10-CM

## 2019-10-07 DIAGNOSIS — J440 Chronic obstructive pulmonary disease with acute lower respiratory infection: Secondary | ICD-10-CM

## 2019-10-07 DIAGNOSIS — J209 Acute bronchitis, unspecified: Secondary | ICD-10-CM

## 2019-10-07 LAB — POCT GLYCOSYLATED HEMOGLOBIN (HGB A1C): Hemoglobin A1C: 8.3 % — AB (ref 4.0–5.6)

## 2019-10-07 LAB — GLUCOSE, POCT (MANUAL RESULT ENTRY): POC Glucose: 274 mg/dl — AB (ref 70–99)

## 2019-10-07 MED ORDER — CARVEDILOL 25 MG PO TABS: 25.0000 mg | ORAL_TABLET | Freq: Two times a day (BID) | ORAL | 3 refills | Status: DC

## 2019-10-07 MED ORDER — AMLODIPINE BESYLATE 10 MG PO TABS: 10.0000 mg | ORAL_TABLET | Freq: Every day | ORAL | 3 refills | Status: DC

## 2019-10-07 MED ORDER — ATORVASTATIN CALCIUM 40 MG PO TABS: 40.0000 mg | ORAL_TABLET | Freq: Every day | ORAL | 1 refills | Status: DC

## 2019-10-07 MED ORDER — VICTOZA 18 MG/3ML ~~LOC~~ SOPN: PEN_INJECTOR | SUBCUTANEOUS | 6 refills | Status: DC

## 2019-10-07 MED ORDER — HYDRALAZINE HCL 100 MG PO TABS: 100.0000 mg | ORAL_TABLET | Freq: Three times a day (TID) | ORAL | 1 refills | Status: DC

## 2019-10-07 MED ORDER — ALBUTEROL SULFATE HFA 108 (90 BASE) MCG/ACT IN AERS: 2.0000 | INHALATION_SPRAY | Freq: Four times a day (QID) | RESPIRATORY_TRACT | 1 refills | Status: DC | PRN

## 2019-10-07 MED ORDER — BUDESONIDE-FORMOTEROL FUMARATE 160-4.5 MCG/ACT IN AERO: 2.0000 | INHALATION_SPRAY | Freq: Two times a day (BID) | RESPIRATORY_TRACT | 3 refills | Status: DC

## 2019-10-07 MED ORDER — CLONIDINE HCL 0.1 MG PO TABS
0.2000 mg | ORAL_TABLET | Freq: Once | ORAL | Status: AC
  Administered 2019-10-07: 0.2 mg via ORAL

## 2019-10-07 MED ORDER — BASAGLAR KWIKPEN 100 UNIT/ML ~~LOC~~ SOPN: 40.0000 [IU] | PEN_INJECTOR | Freq: Every day | SUBCUTANEOUS | 3 refills | Status: DC

## 2019-10-07 MED ORDER — INSULIN PEN NEEDLE 31G X 6 MM MISC: 1.0000 | Freq: Two times a day (BID) | 3 refills | Status: DC

## 2019-10-07 MED ORDER — SPIRONOLACTONE 25 MG PO TABS: 25.0000 mg | ORAL_TABLET | Freq: Every day | ORAL | 1 refills | Status: DC

## 2019-10-07 MED FILL — TRUE METRIX TEST STRIP: 50 days supply | Qty: 100 | Fill #0

## 2019-10-07 MED FILL — ATORVASTATIN CALCIUM 40 MG: 40 | 30 days supply | Qty: 30 | Fill #0

## 2019-10-07 MED FILL — !VICTOZA 18MG/3ML INJECT: 18 | 17 days supply | Qty: 3 | Fill #0

## 2019-10-07 MED FILL — CARVEDILOL 25 MG TABLET: 25 | 30 days supply | Qty: 60 | Fill #0

## 2019-10-07 MED FILL — BASAGLAR 100 UNIT/ML KWIKPE: 100 | 30 days supply | Qty: 12 | Fill #0

## 2019-10-07 MED FILL — !SYMBICORT 160-4.5 MCG INH: 160-4.5 | 30 days supply | Qty: 1 | Fill #0

## 2019-10-07 MED FILL — hydrALAZINE HCL 100 MG TABS: 100 | 30 days supply | Qty: 90 | Fill #0

## 2019-10-07 MED FILL — !VENTOLIN HFA INHALER: 108 (90 BAS | 25 days supply | Qty: 18 | Fill #0

## 2019-10-07 MED FILL — SPIRONOLACTONE 25 MG TABLET: 25 | 30 days supply | Qty: 30 | Fill #0

## 2019-10-07 MED FILL — AMLODIPINE BESYLATE 10 MG T: 10 | 30 days supply | Qty: 30 | Fill #0

## 2019-10-07 NOTE — Progress Notes (Signed)
Assessment & Plan:  Matthew Odom was seen today for establish care.  Diagnoses and all orders for this visit:  Type 2 diabetes mellitus with other specified complication, with long-term current use of insulin (HCC) -     Glucose (CBG) -     HgB A1c -     atorvastatin (LIPITOR) 40 MG tablet; Take 1 tablet (40 mg total) by mouth daily. -     liraglutide (VICTOZA) 18 MG/3ML SOPN; NEEDS PASS  SubQ:  Inject 0.6 mg once daily into the skin. Week 2: increase to 1.2 mg once daily; week 3: increase to 1.8 mg once daily -     Insulin Glargine (BASAGLAR KWIKPEN) 100 UNIT/ML; Inject 0.4 mLs (40 Units total) into the skin daily. NEEDS DOH -     Insulin Pen Needle 31G X 6 MM MISC; 1 Device by Does not apply route 2 (two) times daily. -     Cancel: Hemoglobin A1c -     Lipid panel -     Microalbumin / creatinine urine ratio Continue blood sugar control as discussed in office today, low carbohydrate diet, and regular physical exercise as tolerated, 150 minutes per week (30 min each day, 5 days per week, or 50 min 3 days per week). Keep blood sugar logs with fasting goal of 90-130 mg/dl, post prandial (after you eat) less than 180.  For Hypoglycemia: BS <60 and Hyperglycemia BS >400; contact the clinic ASAP. Annual eye exams and foot exams are recommended.   Essential hypertension -     cloNIDine (CATAPRES) tablet 0.2 mg -     amLODipine (NORVASC) 10 MG tablet; Take 1 tablet (10 mg total) by mouth daily. -     carvedilol (COREG) 25 MG tablet; Take 1 tablet (25 mg total) by mouth 2 (two) times daily with a meal. -     hydrALAZINE (APRESOLINE) 100 MG tablet; Take 1 tablet (100 mg total) by mouth 3 (three) times daily. -     spironolactone (ALDACTONE) 25 MG tablet; Take 1 tablet (25 mg total) by mouth daily. -     CMP14+EGFR Continue all antihypertensives as prescribed.  Remember to bring in your blood pressure log with you for your follow up appointment.  DASH/Mediterranean Diets are healthier choices for  HTN.    COPD with acute bronchitis (HCC) -     budesonide-formoterol (SYMBICORT) 160-4.5 MCG/ACT inhaler; Inhale 2 puffs into the lungs 2 (two) times daily. -     albuterol (VENTOLIN HFA) 108 (90 Base) MCG/ACT inhaler; Inhale 2 puffs into the lungs every 6 (six) hours as needed for wheezing or shortness of breath (or cough). ONLY AS NEEDED. Not meant for every day use  Screening for deficiency anemia -     CBC    Patient has been counseled on age-appropriate routine health concerns for screening and prevention. These are reviewed and up-to-date. Referrals have been placed accordingly. Immunizations are up-to-date or declined.    Subjective:   Chief Complaint  Patient presents with  . Establish Care    Pt. is here to establish care for diabetes and hypertension.    HPI [name] 49 y.o. male presents to office today to establish care.  has a past medical history of Cocaine use, Diabetes mellitus without complication ([place]), Enlarged heart, History of degenerative disc disease, Hyperlipidemia, Hypertension, NSTEMI (non-ST elevated myocardial infarction) ([place]), and Sciatic nerve pain.    Essential Hypertension He has not ever seen a primary care provider.  Currently not taking any  blood pressure medications. Previous medications include: carvedilol 25 mg BID, hydralazine 100 mg TID, Lisinopril 10 mg daily and spironolactone 25 mg daily. States he was diagnosed with sleep apnea years ago but could not afford the follow up test or CPAP. This may also be contributing to his HTN.  BP Readings from Last 3 Encounters:  10/07/19 (!) 220/135  01/20/19 (!) 213/126  06/29/18 (!) 150/78    COPD Endorses worsening shortness of breath. He has been excessively using his albuterol inhaler. Will start symbicort today. He continues to smoke cigarettes.   DM TYPE 2 Using a meter he purchased from the drug store. Poorly controlled. Will not resume metformin due to history of CKD stage 2. Will  restart basaglar 40 units daily and victoza 1.8 mg daily. Fasting blood glucose levels 170-180s. Denies symptoms of hypo or hyperglycemia. Overdue for eye exam. LDL not at goal. Will restart lipitor 40 mg dialy.  Lab Results  Component Value Date   HGBA1C 8.3 (A) 10/07/2019   Lab Results  Component Value Date   LDLCALC 146 (H) 01/03/2018   Review of Systems  Constitutional: Negative for fever, malaise/fatigue and weight loss.  HENT: Negative.  Negative for nosebleeds.   Eyes: Negative.  Negative for blurred vision, double vision and photophobia.  Respiratory: Positive for shortness of breath. Negative for cough.   Cardiovascular: Negative.  Negative for chest pain, palpitations and leg swelling.  Gastrointestinal: Negative.  Negative for heartburn, nausea and vomiting.  Musculoskeletal: Negative.  Negative for myalgias.  Neurological: Negative.  Negative for dizziness, focal weakness, seizures and headaches.  Psychiatric/Behavioral: Negative.  Negative for suicidal ideas.    Past Medical History:  Diagnosis Date  . Cocaine use   . Diabetes mellitus without complication ([place])   . Enlarged heart   . History of degenerative disc disease   . Hyperlipidemia   . Hypertension   . NSTEMI (non-ST elevated myocardial infarction) ([place])   . Sciatic nerve pain     Past Surgical History:  Procedure Laterality Date  . NO PAST SURGERIES      Family History  Problem Relation Age of Onset  . Heart attack Mother     Social History Reviewed with no changes to be made today.   Outpatient Medications Prior to Visit  Medication Sig Dispense Refill  . atorvastatin (LIPITOR) 40 MG tablet Take 40 mg by mouth daily.    . carvedilol (COREG) 25 MG tablet Take 1 tablet (25 mg total) by mouth 2 (two) times daily with a meal. 60 tablet 3  . insulin detemir (LEVEMIR) 100 unit/ml SOLN Inject 0.4 mLs (40 Units total) into the skin daily. (Patient taking differently: Inject 40 Units into the skin daily.  15 units of 2x a day per patient.) 2 mL 3  . Insulin Pen Needle 31G X 6 MM MISC 1 Device by Does not apply route 2 (two) times daily. 60 each 3  . spironolactone (ALDACTONE) 25 MG tablet Take 1 tablet (25 mg total) by mouth daily. 30 tablet 0  . hydrALAZINE (APRESOLINE) 100 MG tablet Take 1 tablet (100 mg total) by mouth 3 (three) times daily. (Patient not taking: Reported on 10/07/2019) 90 tablet 0  . lisinopril (PRINIVIL,ZESTRIL) 5 MG tablet Take 2 tablets (10 mg total) by mouth daily. 30 tablet 11  . metFORMIN (GLUCOPHAGE) 500 MG tablet Take 2 tablets (1,000 mg total) by mouth 2 (two) times daily with a meal. (Patient not taking: Reported on 10/07/2019) 60 tablet 0  No facility-administered medications prior to visit.    Allergies  Allergen Reactions  . Aspirin Hives  . Nitroglycerin Other (See Comments)    Nitroglycerin paste will make blood pressure go up and cause headaches. The nitroglycerin tablets do fine.       Objective:    BP (!) 220/135 (BP Location: Left Arm, Patient Position: Sitting, Cuff Size: Large)   Pulse 97   Temp 97.7 F (36.5 C) (Temporal)   Ht 5' 10.47" (1.79 m)   Wt 245 lb (111.1 kg)   SpO2 97%   BMI 34.68 kg/m  Wt Readings from Last 3 Encounters:  10/07/19 245 lb (111.1 kg)  01/20/19 250 lb (113.4 kg)  01/04/18 241 lb 1.6 oz (109.4 kg)    Physical Exam Vitals and nursing note reviewed.  Constitutional:      Appearance: He is well-developed.  HENT:     Head: Normocephalic and atraumatic.  Cardiovascular:     Rate and Rhythm: Normal rate and regular rhythm.     Heart sounds: Normal heart sounds. No murmur. No friction rub. No gallop.   Pulmonary:     Effort: Pulmonary effort is normal. No tachypnea or respiratory distress.     Breath sounds: Normal breath sounds. No decreased breath sounds, wheezing, rhonchi or rales.  Chest:     Chest wall: No tenderness.  Abdominal:     General: Bowel sounds are normal.     Palpations: Abdomen is soft.    Musculoskeletal:        General: Normal range of motion.     Cervical back: Normal range of motion.  Skin:    General: Skin is warm and dry.  Neurological:     Mental Status: He is alert and oriented to person, place, and time.     Coordination: Coordination normal.  Psychiatric:        Behavior: Behavior normal. Behavior is cooperative.        Thought Content: Thought content normal.        Judgment: Judgment normal.          Patient has been counseled extensively about nutrition and exercise as well as the importance of adherence with medications and regular follow-up. The patient was given clear instructions to go to ER or return to medical center if symptoms don't improve, worsen or new problems develop. The patient verbalized understanding.   Follow-up: Return in about 4 weeks (around 11/04/2019) for meter check and BP.   [name], FNP-BC [facility] and [place], [place]   10/07/2019, 9:14 PM

## 2019-10-08 ENCOUNTER — Other Ambulatory Visit: Payer: Self-pay | Admitting: Nurse Practitioner

## 2019-10-08 DIAGNOSIS — E1122 Type 2 diabetes mellitus with diabetic chronic kidney disease: Secondary | ICD-10-CM

## 2019-10-09 LAB — CMP14+EGFR
ALT: 17 IU/L (ref 0–44)
AST: 15 IU/L (ref 0–40)
Albumin/Globulin Ratio: 1.4 (ref 1.2–2.2)
Albumin: 3.7 g/dL — ABNORMAL LOW (ref 4.0–5.0)
Alkaline Phosphatase: 108 IU/L (ref 48–121)
BUN/Creatinine Ratio: 11 (ref 9–20)
BUN: 29 mg/dL — ABNORMAL HIGH (ref 6–24)
Bilirubin Total: 0.4 mg/dL (ref 0.0–1.2)
CO2: 22 mmol/L (ref 20–29)
Calcium: 8.5 mg/dL — ABNORMAL LOW (ref 8.7–10.2)
Chloride: 100 mmol/L (ref 96–106)
Creatinine, Ser: 2.55 mg/dL — ABNORMAL HIGH (ref 0.76–1.27)
GFR calc Af Amer: 33 mL/min/{1.73_m2} — ABNORMAL LOW (ref >59)
GFR calc non Af Amer: 29 mL/min/{1.73_m2} — ABNORMAL LOW (ref >59)
Globulin, Total: 2.6 g/dL (ref 1.5–4.5)
Glucose: 282 mg/dL — ABNORMAL HIGH (ref 65–99)
Potassium: 3.6 mmol/L (ref 3.5–5.2)
Sodium: 137 mmol/L (ref 134–144)
Total Protein: 6.3 g/dL (ref 6.0–8.5)

## 2019-10-09 LAB — MICROALBUMIN / CREATININE URINE RATIO
Creatinine, Urine: 155.1 mg/dL
Microalb/Creat Ratio: 5247 mg/g creat — ABNORMAL HIGH (ref 0–29)
Microalbumin, Urine: 8138.6 ug/mL

## 2019-10-09 LAB — LIPID PANEL
Chol/HDL Ratio: 4.7 ratio (ref 0.0–5.0)
Cholesterol, Total: 244 mg/dL — ABNORMAL HIGH (ref 100–199)
HDL: 52 mg/dL (ref >39)
LDL Chol Calc (NIH): 163 mg/dL — ABNORMAL HIGH (ref 0–99)
Triglycerides: 157 mg/dL — ABNORMAL HIGH (ref 0–149)
VLDL Cholesterol Cal: 29 mg/dL (ref 5–40)

## 2019-10-09 LAB — CBC
Hematocrit: 44.8 % (ref 37.5–51.0)
Hemoglobin: 14.7 g/dL (ref 13.0–17.7)
MCH: 28.3 pg (ref 26.6–33.0)
MCHC: 32.8 g/dL (ref 31.5–35.7)
MCV: 86 fL (ref 79–97)
Platelets: 280 10*3/uL (ref 150–450)
RBC: 5.2 x10E6/uL (ref 4.14–5.80)
RDW: 14.4 % (ref 11.6–15.4)
WBC: 7.7 10*3/uL (ref 3.4–10.8)

## 2019-10-10 ENCOUNTER — Telehealth: Payer: Self-pay

## 2019-10-10 DIAGNOSIS — E1169 Type 2 diabetes mellitus with other specified complication: Secondary | ICD-10-CM

## 2019-10-10 MED ORDER — INSULIN PEN NEEDLE 31G X 6 MM MISC: 1.0000 | Freq: Two times a day (BID) | 3 refills | Status: DC

## 2019-10-10 NOTE — Telephone Encounter (Signed)
[phone] Romelle Starcher, wife, called stating [facility] pharmacy Gave pt testing strips but does not need them. Pt was to get needles that connect to the insulin pens but it was not given to pt. Please send script for needles for insulin pens.

## 2019-10-10 NOTE — Telephone Encounter (Signed)
Spoke to pharmacy and verified they did not receive a Rx for insulin pen needles. CMA will reorder it to the [facility] pharmacy.

## 2019-10-11 ENCOUNTER — Other Ambulatory Visit: Payer: Self-pay | Admitting: Pharmacist

## 2019-10-11 DIAGNOSIS — Z794 Long term (current) use of insulin: Secondary | ICD-10-CM

## 2019-10-11 DIAGNOSIS — E1169 Type 2 diabetes mellitus with other specified complication: Secondary | ICD-10-CM

## 2019-10-11 MED ORDER — TRUE METRIX METER DEVI: 0 refills | Status: DC

## 2019-10-11 MED ORDER — INSULIN PEN NEEDLE 31G X 6 MM MISC: 1.0000 | Freq: Two times a day (BID) | 3 refills | Status: DC

## 2019-10-11 MED ORDER — TRUEPLUS LANCETS 28G MISC: 3 refills | Status: DC

## 2019-10-19 ENCOUNTER — Ambulatory Visit: Payer: Self-pay

## 2019-10-24 MED FILL — TRUEplus LANCETS 28G MISC: 33 days supply | Qty: 100 | Fill #0

## 2019-10-24 MED FILL — !TRUE METRIX BLOOD GLUCOSE: 1 days supply | Qty: 1 | Fill #0

## 2019-10-24 MED FILL — TRUEPLUS PEN NDL 31GX5/16: 31G X 8 MM | 50 days supply | Qty: 100 | Fill #0

## 2019-11-08 ENCOUNTER — Ambulatory Visit: Payer: Self-pay | Attending: Nurse Practitioner | Admitting: Nurse Practitioner

## 2019-11-08 ENCOUNTER — Other Ambulatory Visit: Payer: Self-pay

## 2019-11-08 ENCOUNTER — Encounter: Payer: Self-pay | Admitting: Nurse Practitioner

## 2019-11-08 ENCOUNTER — Other Ambulatory Visit: Payer: Self-pay | Admitting: Nurse Practitioner

## 2019-11-08 VITALS — BP 171/115 | HR 76 | Temp 97.7°F | Ht 70.5 in | Wt 245.0 lb

## 2019-11-08 DIAGNOSIS — N1832 Chronic kidney disease, stage 3b: Secondary | ICD-10-CM

## 2019-11-08 DIAGNOSIS — J440 Chronic obstructive pulmonary disease with acute lower respiratory infection: Secondary | ICD-10-CM

## 2019-11-08 DIAGNOSIS — I10 Essential (primary) hypertension: Secondary | ICD-10-CM

## 2019-11-08 DIAGNOSIS — J209 Acute bronchitis, unspecified: Secondary | ICD-10-CM

## 2019-11-08 DIAGNOSIS — I214 Non-ST elevation (NSTEMI) myocardial infarction: Secondary | ICD-10-CM

## 2019-11-08 DIAGNOSIS — Z794 Long term (current) use of insulin: Secondary | ICD-10-CM

## 2019-11-08 DIAGNOSIS — E1169 Type 2 diabetes mellitus with other specified complication: Secondary | ICD-10-CM

## 2019-11-08 DIAGNOSIS — G4733 Obstructive sleep apnea (adult) (pediatric): Secondary | ICD-10-CM

## 2019-11-08 DIAGNOSIS — J9601 Acute respiratory failure with hypoxia: Secondary | ICD-10-CM

## 2019-11-08 LAB — GLUCOSE, POCT (MANUAL RESULT ENTRY): POC Glucose: 114 mg/dl — AB (ref 70–99)

## 2019-11-08 MED ORDER — PREDNISONE 20 MG PO TABS: 20.0000 mg | ORAL_TABLET | Freq: Every day | ORAL | 0 refills | Status: AC

## 2019-11-08 MED ORDER — NITROGLYCERIN 0.4 MG SL SUBL: 0.4000 mg | SUBLINGUAL_TABLET | SUBLINGUAL | 3 refills | Status: DC | PRN

## 2019-11-08 MED ORDER — ALBUTEROL SULFATE HFA 108 (90 BASE) MCG/ACT IN AERS: 2.0000 | INHALATION_SPRAY | Freq: Four times a day (QID) | RESPIRATORY_TRACT | 1 refills | Status: DC | PRN

## 2019-11-08 MED ORDER — SPIRONOLACTONE 50 MG PO TABS: 50.0000 mg | ORAL_TABLET | Freq: Every day | ORAL | 1 refills | Status: DC

## 2019-11-08 MED ORDER — HYDRALAZINE HCL 100 MG PO TABS: 100.0000 mg | ORAL_TABLET | Freq: Three times a day (TID) | ORAL | 1 refills | Status: DC

## 2019-11-08 MED ORDER — BUDESONIDE-FORMOTEROL FUMARATE 160-4.5 MCG/ACT IN AERO: 2.0000 | INHALATION_SPRAY | Freq: Two times a day (BID) | RESPIRATORY_TRACT | 3 refills | Status: DC

## 2019-11-08 MED ORDER — CLONIDINE HCL 0.1 MG PO TABS
0.2000 mg | ORAL_TABLET | Freq: Once | ORAL | Status: AC
  Administered 2019-11-08: 0.2 mg via ORAL

## 2019-11-08 MED FILL — hydrALAZINE HCL 100 MG TABS: 100 | 30 days supply | Qty: 90 | Fill #0

## 2019-11-08 MED FILL — SPIRONOLACTONE 50 MG TABLET: 50 | 30 days supply | Qty: 30 | Fill #0

## 2019-11-08 MED FILL — ALBUTEROL SULFATE HFA 108 (: 108 (90 BAS | 25 days supply | Qty: 18 | Fill #0

## 2019-11-08 MED FILL — SYMBICORT 160-4.5 MCG INH: 160-4.5 | 30 days supply | Qty: 10 | Fill #0

## 2019-11-08 MED FILL — predniSONE 20 MG TABS: 20 | 5 days supply | Qty: 5 | Fill #0

## 2019-11-08 MED FILL — NITROGLYCERIN 0.4 MG TAB SL: 0.4 | 25 days supply | Qty: 25 | Fill #0

## 2019-11-08 NOTE — Progress Notes (Signed)
Matthew Odom was seen today for blood pressure check.  Diagnoses and all orders for this visit:  Essential hypertension -     cloNIDine (CATAPRES) tablet 0.2 mg -     hydrALAZINE (APRESOLINE) 100 MG tablet; Take 1 tablet (100 mg total) by mouth 3 (three) times daily. -     spironolactone (ALDACTONE) 50 MG tablet; Take 1 tablet (50 mg total) by mouth daily. -     Split night study; Future Continue all antihypertensives as prescribed.  Remember to bring in your blood pressure log with you for your follow up appointment.  DASH/Mediterranean Diets are healthier choices for HTN.    Type 2 diabetes mellitus with other specified complication, with long-term current use of insulin (HCC) -     Glucose (CBG) -     Ambulatory referral to Ophthalmology  COPD with acute bronchitis ([place]) -     albuterol (VENTOLIN HFA) 108 (90 Base) MCG/ACT inhaler; Inhale 2 puffs into the lungs every 6 (six) hours as needed for wheezing or shortness of breath (or cough). ONLY AS NEEDED. Not meant for every day use -     budesonide-formoterol (SYMBICORT) 160-4.5 MCG/ACT inhaler; Inhale 2 puffs into the lungs 2 (two) times daily. -     predniSONE (DELTASONE) 20 MG tablet; Take 1 tablet (20 mg total) by mouth daily with breakfast for 5 days. Use inhalers as prescribed  Stage 3b chronic kidney disease -     Ambulatory referral to Nephrology  Acute respiratory failure with hypoxia ([place]) RESOLVED  NSTEMI (non-ST elevated myocardial infarction) (HCC) -     nitroGLYCERIN (NITROSTAT) 0.4 MG SL tablet; Place 1 tablet (0.4 mg total) under the tongue every 5 (five) minutes as needed for chest pain. -     Ambulatory referral to Cardiology  OSA (obstructive sleep apnea) -     Split night study; Future    Patient has been counseled on age-appropriate routine health concerns for screening and prevention. These are reviewed and up-to-date. Referrals have been placed accordingly. Immunizations are up-to-date or declined.      Subjective:       Chief Complaint  Patient presents with  . Blood Pressure Check    Pt. is here for blood pressure check. Pt. did not bring his meter for his diabetes meter check.    HPI Matthew Odom 49 y.o. male presents to office today for meter check and HTN. He has a history most significant for cocaine abuse,  COPD( trying to quit smoking), HTN, DM TYPE 2, HPL. History of NSTEMI and enlarged heart. Abnormal ECHO with EF 45-50% last year.    He missed his financial assistance appointment with the financial counselor. He needs to see nephrology and cardiology. Creatinine is very elevated likely related to longstanding HTN and poorly controlled diabetes. He endorses nocturnal polyuria over the past few months.  Recent Labs       Lab Results  Component Value Date   CREATININE 2.55 (H) 10/07/2019       Essential Hypertension     Blood pressure is poorly controlled. He endorses medication compliance taking spironolactone 25 mg daily,hydralazine 100 mg TID, carvedilol 25 mg BID, amlodipine 10 mg daily. I am not sure how forthcoming he is in regard to medication adherence. I am Increasing spironolactone to 50 mg today. He is unable to recall what specific medications he is taking at certain times. Denies chest pain, shortness of breath, palpitations, lightheadedness, dizziness, headaches or BLE edema. He needs a sleep study.  Endorses excessive daytime sleepiness and fatigue.     BP Readings from Last 3 Encounters:  11/08/19 (!) 171/115  10/07/19 (!) 220/135  01/20/19 (!) 213/126   DM TYPE 2 He did not pick up the meter that was prescribed for him last month. Unable to recall the exact dosage of victoza he is administering. LDL is not at goal. He has been prescribed atorvastatin 40 mg daily.  Recent Labs       Lab Results  Component Value Date   HGBA1C 8.3 (A) 10/07/2019     Lab Results  Component Value Date   LDLCALC 163 (H) 10/07/2019   COPD He would like to  be referred to pulmonology. RA Sats are normal.  Has cut back on smoking and down to about 1 cigarette per day. Endorses increased dyspnea walking short distances. Also with wheezing. He is not using his symbicort correctly. Only 1 puff per day. I have instructed him the instructions are for 2 puffs BID.      Review of Systems  Constitutional: Negative for fever, malaise/fatigue and weight loss.  HENT: Negative.  Negative for nosebleeds.   Eyes: Negative.  Negative for blurred vision, double vision and photophobia.  Respiratory: Negative.  Negative for cough and shortness of breath.   Cardiovascular: Negative.  Negative for chest pain, palpitations and leg swelling.  Gastrointestinal: Negative.  Negative for heartburn, nausea and vomiting.  Musculoskeletal: Negative.  Negative for myalgias.  Neurological: Negative.  Negative for dizziness, focal weakness, seizures and headaches.  Psychiatric/Behavioral: Negative.  Negative for suicidal ideas.        Past Medical History:  Diagnosis Date  . Cocaine use   . Diabetes mellitus without complication ([place])   . Enlarged heart   . History of degenerative disc disease   . Hyperlipidemia   . Hypertension   . NSTEMI (non-ST elevated myocardial infarction) ([place])   . Sciatic nerve pain          Past Surgical History:  Procedure Laterality Date  . NO PAST SURGERIES           Family History  Problem Relation Age of Onset  . Heart attack Mother     Social History Reviewed with no changes to be made today.         Outpatient Medications Prior to Visit  Medication Sig Dispense Refill  . amLODipine (NORVASC) 10 MG tablet Take 1 tablet (10 mg total) by mouth daily. 90 tablet 3  . atorvastatin (LIPITOR) 40 MG tablet Take 1 tablet (40 mg total) by mouth daily. 90 tablet 1  . Blood Glucose Monitoring Suppl (TRUE METRIX METER) DEVI Use to check blood sugar TID. Dx: E11.69 1 each 0  . carvedilol (COREG) 25 MG tablet  Take 1 tablet (25 mg total) by mouth 2 (two) times daily with a meal. 60 tablet 3  . Insulin Pen Needle 31G X 6 MM MISC 1 Device by Does not apply route 2 (two) times daily. 100 each 3  . liraglutide (VICTOZA) 18 MG/3ML SOPN NEEDS PASS  SubQ:  Inject 0.6 mg once daily into the skin. Week 2: increase to 1.2 mg once daily; week 3: increase to 1.8 mg once daily 3 pen 6  . TRUEplus Lancets 28G MISC Use to check blood sugar TID. Dx: E11.69 100 each 3  . albuterol (VENTOLIN HFA) 108 (90 Base) MCG/ACT inhaler Inhale 2 puffs into the lungs every 6 (six) hours as needed for wheezing or shortness of breath (or cough).  ONLY AS NEEDED. Not meant for every day use 18 g 1  . budesonide-formoterol (SYMBICORT) 160-4.5 MCG/ACT inhaler Inhale 2 puffs into the lungs 2 (two) times daily. 1 Inhaler 3  . hydrALAZINE (APRESOLINE) 100 MG tablet Take 1 tablet (100 mg total) by mouth 3 (three) times daily. 90 tablet 1  . spironolactone (ALDACTONE) 25 MG tablet Take 1 tablet (25 mg total) by mouth daily. 90 tablet 1  . Insulin Glargine (BASAGLAR KWIKPEN) 100 UNIT/ML Inject 0.4 mLs (40 Units total) into the skin daily. NEEDS DOH 12 mL 3   No facility-administered medications prior to visit.         Allergies  Allergen Reactions  . Aspirin Hives  . Nitroglycerin Other (See Comments)    Nitroglycerin paste will make blood pressure go up and cause headaches. The nitroglycerin tablets do fine.       Objective:   Objective    BP (!) 171/115 (BP Location: Left Arm, Patient Position: Sitting, Cuff Size: Normal)   Pulse 76   Temp 97.7 F (36.5 C) (Temporal)   Ht 5' 10.5" (1.791 m)   Wt 245 lb (111.1 kg)   SpO2 96%   BMI 34.66 kg/m     Wt Readings from Last 3 Encounters:  11/08/19 245 lb (111.1 kg)  10/07/19 245 lb (111.1 kg)  01/20/19 250 lb (113.4 kg)    Physical Exam HENT:     Head: Normocephalic.  Cardiovascular:     Rate and Rhythm: Normal rate and regular rhythm.  Pulmonary:     Breath  sounds: Wheezing present.  Abdominal:     General: Bowel sounds are normal.  Musculoskeletal:        General: Normal range of motion.     Cervical back: Normal range of motion.  Skin:    General: Skin is warm and dry.  Neurological:     Mental Status: He is alert.         Patient has been counseled extensively about nutrition and exercise as well as the importance of adherence with medications and regular follow-up. The patient was given clear instructions to go to ER or return to medical center if symptoms don't improve, worsen or new problems develop. The patient verbalized understanding.   Follow-up: Return for wants to see Dr. Joya Gaskins for COPD for evaluation. Not urgent. See luke in 2 weeks for BP check.

## 2019-11-14 ENCOUNTER — Ambulatory Visit: Payer: Self-pay

## 2019-11-22 ENCOUNTER — Other Ambulatory Visit: Payer: Self-pay

## 2019-11-22 ENCOUNTER — Encounter: Payer: Self-pay | Admitting: Pharmacist

## 2019-11-22 ENCOUNTER — Ambulatory Visit: Payer: Self-pay | Attending: Nurse Practitioner | Admitting: Pharmacist

## 2019-11-22 VITALS — BP 180/116

## 2019-11-22 DIAGNOSIS — I10 Essential (primary) hypertension: Secondary | ICD-10-CM

## 2019-11-22 NOTE — Progress Notes (Signed)
   S:    PCP: Zelda   Patient arrives in good spirits. Presents to the clinic for hypertension evaluation, counseling, and management.  Patient was referred and last seen by Primary Care Provider on 11/08/2019. BP was 11/08/2019 at that visit - spironolactone dose increased.     Medication adherence reported.  Current BP Medications include:  Amlodipine 10 mg daily, carvedilol 25 mg BID, hydralazine 100 mg TID, spironolactone 50 mg daily   Antihypertensives tried in the past include: hydralazine at lower doses, metoprolol, losartan, lisinopril, furosemide, HCTZ  Dietary habits include: compliant with salt restriction; denies drinking caffeine  Exercise habits include: walks occasionally  Family / Social history:  - FHx: MI - Tobacco: smokes ~0.5 PPD - Alcohol: none   O:  Vitals:   11/22/19 1104  BP: (!) 180/116   Home BP readings: none   Last 3 Office BP readings: BP Readings from Last 3 Encounters:  11/22/19 (!) 180/116  11/08/19 (!) 171/115  10/07/19 (!) 220/135    BMET    Component Value Date/Time   NA 137 10/07/2019 1115   K 3.6 10/07/2019 1115   CL 100 10/07/2019 1115   CO2 22 10/07/2019 1115   GLUCOSE 282 (H) 10/07/2019 1115   GLUCOSE 215 (H) 06/29/2018 0343   BUN 29 (H) 10/07/2019 1115   CREATININE 2.55 (H) 10/07/2019 1115   CALCIUM 8.5 (L) 10/07/2019 1115   GFRNONAA 29 (L) 10/07/2019 1115   GFRAA 33 (L) 10/07/2019 1115    Renal function: CrCl cannot be calculated (Patient's most recent lab result is older than the maximum 21 days allowed.).  Clinical ASCVD: Yes  The ASCVD Risk score Mikey Bussing DC Jr., et al., 2013) failed to calculate for the following reasons:   The patient has a prior MI or stroke diagnosis   A/P: Hypertension longstanding currently uncontrolled on current medications. BP Goal = < 130/80 mmHg. Medication adherence reported. Despite increases in dosing of his anti-hypertensives, pt's BP remains very elevated. He has a visit scheduled with  the Cardiologist next month. He is currently asymptomatic - instructions given to seek emergency care if symptoms concerning of hypertensive emergency occur. -Continued current regimen.  -Counseled on lifestyle modifications for blood pressure control including reduced dietary sodium, increased exercise, adequate sleep.  Results reviewed and written information provided.   Total time in face-to-face counseling 15 minutes.   F/U Clinic Visit 11/28/19 with Dr. Joya Gaskins.   Benard Halsted, PharmD, [place] [phone]

## 2019-11-27 NOTE — Progress Notes (Signed)
Subjective:    Patient ID: Matthew Odom, male    DOB: 1971-01-30, 49 y.o.   MRN: [id number]  48 y.o.M HTN, CKD stage III, T2DM A1C 8.3 10/2019, Copd,  Ref for Copd evaluation  Patient is seen today upon referral for COPD diagnosed for 10 years. He still smokes 1/2 pack a day of cigarettes has been on Symbicort for 2 months with minimal improvement. He says prednisone helps but he ran out recently. He is not been on recent antibiotics. The patient had some chest pain 2 weeks ago took a nitroglycerin for this with some relief. He does have thick gray mucus. He has significant fatigue. His blood pressure has been running lower. His oral intake has been decreased somewhat with fluids. He currently does not work outside the home. He does have outstanding appointments with cardiology and nephrology. He has stage III CKD. He also has diabetes recent blood sugars in the 110 range.  Review shortness of breath assessment below Shortness of Breath This is a chronic problem. The current episode started more than 1 year ago. The problem occurs constantly. The problem has been gradually worsening. Associated symptoms include chest pain, headaches, orthopnea, PND, rhinorrhea, a sore throat, sputum production and wheezing. Pertinent negatives include no leg swelling. The symptoms are aggravated by any activity, exercise, odors and fumes. Risk factors include smoking. He has tried beta agonist inhalers and oral steroids for the symptoms. The treatment provided moderate relief. His past medical history is significant for COPD. There is no history of pneumonia.    Past Medical History:  Diagnosis Date   Cocaine use    Diabetes mellitus without complication ([place])    Enlarged heart    History of degenerative disc disease    Hyperlipidemia    Hypertension    NSTEMI (non-ST elevated myocardial infarction) (HCC)    Sciatic nerve pain      Family History  Problem Relation Age of Onset   Heart attack  Mother      Social History   Socioeconomic History   Marital status: Single    Spouse name: Not on file   Number of children: Not on file   Years of education: Not on file   Highest education level: Not on file  Occupational History   Not on file  Tobacco Use   Smoking status: Current Every Day Smoker    Packs/day: 1.00    Types: Cigarettes   Smokeless tobacco: Never Used  Substance and Sexual Activity   Alcohol use: Yes    Comment: occasionally   Drug use: Never   Sexual activity: Yes  Other Topics Concern   Not on file  Social History Narrative   Not on file   Social Determinants of Health   Financial Resource Strain:    Difficulty of Paying Living Expenses:   Food Insecurity:    Worried About Charity fundraiser in the Last Year:    Arboriculturist in the Last Year:   Transportation Needs:    Film/video editor (Medical):    Lack of Transportation (Non-Medical):   Physical Activity:    Days of Exercise per Week:    Minutes of Exercise per Session:   Stress:    Feeling of Stress :   Social Connections:    Frequency of Communication with Friends and Family:    Frequency of Social Gatherings with Friends and Family:    Attends Religious Services:    Active Member of Clubs  or Organizations:    Attends Music therapist:    Marital Status:   Intimate Partner Violence:    Fear of Current or Ex-Partner:    Emotionally Abused:    Physically Abused:    Sexually Abused:      Allergies  Allergen Reactions   Aspirin Hives   Nitroglycerin Other (See Comments)    Nitroglycerin paste will make blood pressure go up and cause headaches. The nitroglycerin tablets do fine.     Outpatient Medications Prior to Visit  Medication Sig Dispense Refill   albuterol (VENTOLIN HFA) 108 (90 Base) MCG/ACT inhaler Inhale 2 puffs into the lungs every 6 (six) hours as needed for wheezing or shortness of breath (or cough). ONLY AS  NEEDED. Not meant for every day use 18 g 1   ALPRAZolam (XANAX) 0.25 MG tablet Take by mouth.     amLODipine (NORVASC) 10 MG tablet Take 1 tablet (10 mg total) by mouth daily. 90 tablet 3   atorvastatin (LIPITOR) 40 MG tablet Take 1 tablet (40 mg total) by mouth daily. 90 tablet 1   Blood Glucose Monitoring Suppl (TRUE METRIX METER) DEVI Use to check blood sugar TID. Dx: E11.69 1 each 0   budesonide-formoterol (SYMBICORT) 160-4.5 MCG/ACT inhaler Inhale 2 puffs into the lungs 2 (two) times daily. 1 Inhaler 3   carvedilol (COREG) 25 MG tablet Take 1 tablet (25 mg total) by mouth 2 (two) times daily with a meal. 60 tablet 3   insulin aspart protamine- aspart (NOVOLOG MIX 70/30) (70-30) 100 UNIT/ML injection Inject into the skin.     Insulin Pen Needle 31G X 6 MM MISC 1 Device by Does not apply route 2 (two) times daily. 100 each 3   liraglutide (VICTOZA) 18 MG/3ML SOPN NEEDS PASS  SubQ:  Inject 0.6 mg once daily into the skin. Week 2: increase to 1.2 mg once daily; week 3: increase to 1.8 mg once daily 3 pen 6   nitroGLYCERIN (NITROSTAT) 0.4 MG SL tablet Place 1 tablet (0.4 mg total) under the tongue every 5 (five) minutes as needed for chest pain. 50 tablet 3   spironolactone (ALDACTONE) 50 MG tablet Take 1 tablet (50 mg total) by mouth daily. 30 tablet 1   TRUEplus Lancets 28G MISC Use to check blood sugar TID. Dx: E11.69 100 each 3   hydrALAZINE (APRESOLINE) 100 MG tablet Take 1 tablet (100 mg total) by mouth 3 (three) times daily. 90 tablet 1   Insulin Glargine (BASAGLAR KWIKPEN) 100 UNIT/ML Inject 0.4 mLs (40 Units total) into the skin daily. NEEDS DOH 12 mL 3   No facility-administered medications prior to visit.     Review of Systems  HENT: Positive for rhinorrhea and sore throat.   Respiratory: Positive for sputum production, shortness of breath and wheezing.   Cardiovascular: Positive for chest pain, orthopnea and PND. Negative for leg swelling.  Neurological: Positive for  headaches.       Objective:   Physical Exam Vitals:   11/28/19 1138  BP: 118/83  Pulse: 96  Resp: 16  Temp: 97.9 F (36.6 C)  SpO2: 97%  Weight: (!) 244 lb (110.7 kg)    Gen: Pleasant, well-nourished, in no distress,  normal affect  ENT: No lesions,  mouth clear,  oropharynx clear, no postnasal drip  Neck: No JVD, no TMG, no carotid bruits  Lungs: No use of accessory muscles, no dullness to percussion, exp wheezes.     Cardiovascular: RRR, heart sounds normal, no murmur  or gallops, no peripheral edema  Abdomen: soft and NT, no HSM,  BS normal  Musculoskeletal: No deformities, no cyanosis or clubbing  Neuro: alert, non focal  Skin: poor skin turgor  BMP Latest Ref Rng & Units 10/07/2019 06/29/2018 01/04/2018  Glucose 65 - 99 mg/dL 282(H) 215(H) 226(H)  BUN 6 - 24 mg/dL 29(H) 31(H) 22(H)  Creatinine 0.76 - 1.27 mg/dL 2.55(H) 1.60(H) 1.35(H)  BUN/Creat Ratio 9 - 20 11 - -  Sodium 134 - 144 mmol/L 137 137 138  Potassium 3.5 - 5.2 mmol/L 3.6 3.5 3.6  Chloride 96 - 106 mmol/L 100 101 103  CO2 20 - 29 mmol/L 22 23 25   Calcium 8.7 - 10.2 mg/dL 8.5(L) 8.7(L) 8.5(L)   CBC Latest Ref Rng & Units 10/07/2019 06/29/2018 01/02/2018  WBC 3.4 - 10.8 x10E3/uL 7.7 8.2 7.7  Hemoglobin 13.0 - 17.7 g/dL 14.7 15.8 16.4  Hematocrit 37.5 - 51.0 % 44.8 49.4 48.0  Platelets 150 - 450 x10E3/uL 280 302 226         Assessment & Plan:  I personally reviewed all images and lab data in the [facility] system as well as any outside material available during this office visit and agree with the  radiology impressions.   Orthostatic hypotension Volume depletion with orthostatic hypotension  We will administer 500 cc of saline via intravenous  The patient will hold hydralazine for 48 hours then resume  COPD with acute bronchitis ([place]) Chronic obstructive lung disease with acute exacerbation  Patient will receive azithromycin for 5-day course and pulse dose prednisone  Patient instructed as to the  proper use of Symbicort inhaler  Obtain chest x-ray  HTN (hypertension) Longstanding difficult control hypertension however at this visit the patient has significant orthostatic hypotension due to volume depletion  As per assessment of hypotension will give 500 cc of saline to hold hydralazine for 48 hours then resume  We will obtain complete lab survey at this visit  Nicotine abuse Ongoing nicotine use exacerbating patient's lung condition  I spent 10 minutes with this patient on smoking cessation counseling recommend he begin NicoDerm patch 21 mg daily and I sent a prescription for same   Corian was seen today for copd.  Diagnoses and all orders for this visit:  Orthostatic hypotension -     Comprehensive metabolic panel -     CBC with Differential/Platelet  Volume depletion -     sodium chloride 0.9 % bolus 500 mL -     Comprehensive metabolic panel  Essential hypertension -     Comprehensive metabolic panel -     CBC with Differential/Platelet -     hydrALAZINE (APRESOLINE) 100 MG tablet; HOLD for 24 hours then resume 100mg  three times daily  COPD with acute bronchitis ([place]) -     DG Chest 2 View; Future  Chronic combined systolic and diastolic CHF, NYHA class 3 (HCC)  Other orders -     predniSONE (DELTASONE) 20 MG tablet; Take 2 tablets daily for 5 days then stop -     azithromycin (ZITHROMAX) 250 MG tablet; Take two once then one daily until gone -     nicotine (NICODERM CQ - DOSED IN MG/24 HOURS) 21 mg/24hr patch; Place 1 patch (21 mg total) onto the skin daily.

## 2019-11-28 ENCOUNTER — Ambulatory Visit: Payer: Self-pay | Attending: Critical Care Medicine | Admitting: Critical Care Medicine

## 2019-11-28 ENCOUNTER — Encounter: Payer: Self-pay | Admitting: Critical Care Medicine

## 2019-11-28 ENCOUNTER — Ambulatory Visit (HOSPITAL_COMMUNITY)
Admission: RE | Admit: 2019-11-28 | Discharge: 2019-11-28 | Disposition: A | Discharge status: Home / Self Care | Payer: Self-pay | Source: Ambulatory Visit | Attending: Critical Care Medicine | Admitting: Critical Care Medicine

## 2019-11-28 ENCOUNTER — Other Ambulatory Visit: Payer: Self-pay

## 2019-11-28 VITALS — BP 118/83 | HR 96 | Temp 97.9°F | Resp 16 | Wt 244.0 lb

## 2019-11-28 DIAGNOSIS — R079 Chest pain, unspecified: Secondary | ICD-10-CM

## 2019-11-28 DIAGNOSIS — J440 Chronic obstructive pulmonary disease with acute lower respiratory infection: Secondary | ICD-10-CM

## 2019-11-28 DIAGNOSIS — I5042 Chronic combined systolic (congestive) and diastolic (congestive) heart failure: Secondary | ICD-10-CM

## 2019-11-28 DIAGNOSIS — E869 Volume depletion, unspecified: Secondary | ICD-10-CM

## 2019-11-28 DIAGNOSIS — Z720 Tobacco use: Secondary | ICD-10-CM

## 2019-11-28 DIAGNOSIS — I951 Orthostatic hypotension: Secondary | ICD-10-CM | POA: Insufficient documentation

## 2019-11-28 DIAGNOSIS — J209 Acute bronchitis, unspecified: Secondary | ICD-10-CM

## 2019-11-28 DIAGNOSIS — I10 Essential (primary) hypertension: Secondary | ICD-10-CM

## 2019-11-28 MED ORDER — PREDNISONE 20 MG PO TABS: ORAL_TABLET | ORAL | 0 refills | Status: DC

## 2019-11-28 MED ORDER — SODIUM CHLORIDE 0.9 % IV BOLUS
500.0000 mL | Freq: Once | INTRAVENOUS | Status: AC
  Administered 2019-11-28: 500 mL via INTRAVENOUS

## 2019-11-28 MED ORDER — NICOTINE 21 MG/24HR TD PT24: 21.0000 mg | MEDICATED_PATCH | Freq: Every day | TRANSDERMAL | 0 refills | Status: DC

## 2019-11-28 MED ORDER — AZITHROMYCIN 250 MG PO TABS: ORAL_TABLET | ORAL | 0 refills | Status: DC

## 2019-11-28 MED ORDER — HYDRALAZINE HCL 100 MG PO TABS: ORAL_TABLET | ORAL | 1 refills | Status: DC

## 2019-11-28 MED FILL — predniSONE 20 MG TABS: 20 | 5 days supply | Qty: 10 | Fill #0

## 2019-11-28 MED FILL — NICOTINE 21 MG/24HR PATCH: 21 | 28 days supply | Qty: 28 | Fill #0

## 2019-11-28 MED FILL — hydrALAZINE HCL 100 MG TABS: 100 | 30 days supply | Qty: 90 | Fill #0

## 2019-11-28 MED FILL — ?AZITHROMYCIN 250 MG TABS: 250 | 5 days supply | Qty: 6 | Fill #0

## 2019-11-28 NOTE — Assessment & Plan Note (Addendum)
Volume depletion with orthostatic hypotension  We will administer 500 cc of saline via intravenous  The patient will hold hydralazine for 48 hours then resume

## 2019-11-28 NOTE — Patient Instructions (Signed)
Start prednisone 10mg  Take 4 tablets daily for 5 days then stop Start azithromycin 250mg  Take two once then one daily until gone Use Symbicort two puff twice daily Stop smoking as you can, see below  500 ml of saline iv was given  Labs today to check kidney, electrolytes, muscle enzymes, blood counts A chest xray will be obtained  Return Dr Joya Gaskins 1 month Zelda on Friday July 30th   Coping with Quitting Smoking  Quitting smoking is a physical and mental challenge. You will face cravings, withdrawal symptoms, and temptation. Before quitting, work with your health care provider to make a plan that can help you cope. Preparation can help you quit and keep you from giving in. How can I cope with cravings? Cravings usually last for 5-10 minutes. If you get through it, the craving will pass. Consider taking the following actions to help you cope with cravings:  Keep your mouth busy: ? Chew sugar-free gum. ? Suck on hard candies or a straw. ? Brush your teeth.  Keep your hands and body busy: ? Immediately change to a different activity when you feel a craving. ? Squeeze or play with a ball. ? Do an activity or a hobby, like making bead jewelry, practicing needlepoint, or working with wood. ? Mix up your normal routine. ? Take a short exercise break. Go for a quick walk or run up and down stairs. ? Spend time in public places where smoking is not allowed.  Focus on doing something kind or helpful for someone else.  Call a friend or family member to talk during a craving.  Join a support group.  Call a quit line, such as 1-800-QUIT-NOW.  Talk with your health care provider about medicines that might help you cope with cravings and make quitting easier for you. How can I deal with withdrawal symptoms? Your body may experience negative effects as it tries to get used to not having nicotine in the system. These effects are called withdrawal symptoms. They may include:  Feeling  hungrier than normal.  Trouble concentrating.  Irritability.  Trouble sleeping.  Feeling depressed.  Restlessness and agitation.  Craving a cigarette. To manage withdrawal symptoms:  Avoid places, people, and activities that trigger your cravings.  Remember why you want to quit.  Get plenty of sleep.  Avoid coffee and other caffeinated drinks. These may worsen some of your symptoms. How can I handle social situations? Social situations can be difficult when you are quitting smoking, especially in the first few weeks. To manage this, you can:  Avoid parties, bars, and other social situations where people might be smoking.  Avoid alcohol.  Leave right away if you have the urge to smoke.  Explain to your family and friends that you are quitting smoking. Ask for understanding and support.  Plan activities with friends or family where smoking is not an option. What are some ways I can cope with stress? Wanting to smoke may cause stress, and stress can make you want to smoke. Find ways to manage your stress. Relaxation techniques can help. For example:  Breathe slowly and deeply, in through your nose and out through your mouth.  Listen to soothing, relaxing music.  Talk with a family member or friend about your stress.  Light a candle.  Soak in a bath or take a shower.  Think about a peaceful place. What are some ways I can prevent weight gain? Be aware that many people gain weight after they quit smoking.  However, not everyone does. To keep from gaining weight, have a plan in place before you quit and stick to the plan after you quit. Your plan should include:  Having healthy snacks. When you have a craving, it may help to: ? Eat plain popcorn, crunchy carrots, celery, or other cut vegetables. ? Chew sugar-free gum.  Changing how you eat: ? Eat small portion sizes at meals. ? Eat 4-6 small meals throughout the day instead of 1-2 large meals a day. ? Be mindful  when you eat. Do not watch television or do other things that might distract you as you eat.  Exercising regularly: ? Make time to exercise each day. If you do not have time for a long workout, do short bouts of exercise for 5-10 minutes several times a day. ? Do some form of strengthening exercise, like weight lifting, and some form of aerobic exercise, like running or swimming.  Drinking plenty of water or other low-calorie or no-calorie drinks. Drink 6-8 glasses of water daily, or as much as instructed by your health care provider. Summary  Quitting smoking is a physical and mental challenge. You will face cravings, withdrawal symptoms, and temptation to smoke again. Preparation can help you as you go through these challenges.  You can cope with cravings by keeping your mouth busy (such as by chewing gum), keeping your body and hands busy, and making calls to family, friends, or a helpline for people who want to quit smoking.  You can cope with withdrawal symptoms by avoiding places where people smoke, avoiding drinks with caffeine, and getting plenty of rest.  Ask your health care provider about the different ways to prevent weight gain, avoid stress, and handle social situations. This information is not intended to replace advice given to you by your health care provider. Make sure you discuss any questions you have with your health care provider. Document Revised: 04/03/2017 Document Reviewed: 04/18/2016 Elsevier Patient Education  2020 [employer].

## 2019-11-28 NOTE — Assessment & Plan Note (Signed)
Longstanding difficult control hypertension however at this visit the patient has significant orthostatic hypotension due to volume depletion  As per assessment of hypotension will give 500 cc of saline to hold hydralazine for 48 hours then resume  We will obtain complete lab survey at this visit

## 2019-11-28 NOTE — Progress Notes (Signed)
Needs refill on Victoza

## 2019-11-28 NOTE — Assessment & Plan Note (Signed)
Ongoing nicotine use exacerbating patient's lung condition  I spent 10 minutes with this patient on smoking cessation counseling recommend he begin NicoDerm patch 21 mg daily and I sent a prescription for same

## 2019-11-28 NOTE — Assessment & Plan Note (Addendum)
Chronic obstructive lung disease with acute exacerbation  Patient will receive azithromycin for 5-day course and pulse dose prednisone  Patient instructed as to the proper use of Symbicort inhaler  Obtain chest x-ray

## 2019-11-29 ENCOUNTER — Telehealth: Payer: Self-pay | Admitting: Critical Care Medicine

## 2019-11-29 DIAGNOSIS — Z794 Long term (current) use of insulin: Secondary | ICD-10-CM

## 2019-11-29 DIAGNOSIS — N1832 Chronic kidney disease, stage 3b: Secondary | ICD-10-CM

## 2019-11-29 LAB — COMPREHENSIVE METABOLIC PANEL
ALT: 19 IU/L (ref 0–44)
AST: 18 IU/L (ref 0–40)
Albumin/Globulin Ratio: 1.5 (ref 1.2–2.2)
Albumin: 3.7 g/dL — ABNORMAL LOW (ref 4.0–5.0)
Alkaline Phosphatase: 114 IU/L (ref 48–121)
BUN/Creatinine Ratio: 12 (ref 9–20)
BUN: 32 mg/dL — ABNORMAL HIGH (ref 6–24)
Bilirubin Total: <0.2 mg/dL (ref 0.0–1.2)
CO2: 22 mmol/L (ref 20–29)
Calcium: 8.5 mg/dL — ABNORMAL LOW (ref 8.7–10.2)
Chloride: 99 mmol/L (ref 96–106)
Creatinine, Ser: 2.64 mg/dL — ABNORMAL HIGH (ref 0.76–1.27)
GFR calc Af Amer: 32 mL/min/{1.73_m2} — ABNORMAL LOW (ref >59)
GFR calc non Af Amer: 27 mL/min/{1.73_m2} — ABNORMAL LOW (ref >59)
Globulin, Total: 2.4 g/dL (ref 1.5–4.5)
Glucose: 239 mg/dL — ABNORMAL HIGH (ref 65–99)
Potassium: 3.9 mmol/L (ref 3.5–5.2)
Sodium: 137 mmol/L (ref 134–144)
Total Protein: 6.1 g/dL (ref 6.0–8.5)

## 2019-11-29 LAB — CBC WITH DIFFERENTIAL/PLATELET
Basophils Absolute: 0 10*3/uL (ref 0.0–0.2)
Basos: 1 %
EOS (ABSOLUTE): 0.1 10*3/uL (ref 0.0–0.4)
Eos: 1 %
Hematocrit: 44.5 % (ref 37.5–51.0)
Hemoglobin: 14.5 g/dL (ref 13.0–17.7)
Immature Grans (Abs): 0 10*3/uL (ref 0.0–0.1)
Immature Granulocytes: 0 %
Lymphocytes Absolute: 1.8 10*3/uL (ref 0.7–3.1)
Lymphs: 27 %
MCH: 27.3 pg (ref 26.6–33.0)
MCHC: 32.6 g/dL (ref 31.5–35.7)
MCV: 84 fL (ref 79–97)
Monocytes Absolute: 0.5 10*3/uL (ref 0.1–0.9)
Monocytes: 8 %
Neutrophils Absolute: 4.2 10*3/uL (ref 1.4–7.0)
Neutrophils: 63 %
Platelets: 259 10*3/uL (ref 150–450)
RBC: 5.32 x10E6/uL (ref 4.14–5.80)
RDW: 14.3 % (ref 11.6–15.4)
WBC: 6.7 10*3/uL (ref 3.4–10.8)

## 2019-11-29 NOTE — Telephone Encounter (Signed)
Patient is uninsured and he need to apply for [facility] card   he has an appointment  7/30 when get approved I process the referral thru [facility] care network . Thank you .

## 2019-11-29 NOTE — Telephone Encounter (Signed)
Pt aware of results.  Needs nephrology eval. Pt says never heard back on referral  Will send again  Has worsening renal function

## 2019-12-02 ENCOUNTER — Other Ambulatory Visit: Payer: Self-pay

## 2019-12-02 ENCOUNTER — Ambulatory Visit: Payer: Self-pay | Attending: Nurse Practitioner

## 2019-12-18 ENCOUNTER — Other Ambulatory Visit: Payer: Self-pay

## 2019-12-18 ENCOUNTER — Ambulatory Visit (HOSPITAL_BASED_OUTPATIENT_CLINIC_OR_DEPARTMENT_OTHER): Payer: Self-pay | Attending: Nurse Practitioner | Admitting: Internal Medicine

## 2019-12-18 VITALS — Ht 71.0 in | Wt 250.0 lb

## 2019-12-18 DIAGNOSIS — G4733 Obstructive sleep apnea (adult) (pediatric): Secondary | ICD-10-CM | POA: Insufficient documentation

## 2019-12-18 DIAGNOSIS — E119 Type 2 diabetes mellitus without complications: Secondary | ICD-10-CM | POA: Insufficient documentation

## 2019-12-18 DIAGNOSIS — I10 Essential (primary) hypertension: Secondary | ICD-10-CM | POA: Insufficient documentation

## 2019-12-18 DIAGNOSIS — J449 Chronic obstructive pulmonary disease, unspecified: Secondary | ICD-10-CM | POA: Insufficient documentation

## 2019-12-20 ENCOUNTER — Ambulatory Visit: Payer: Self-pay | Admitting: Nurse Practitioner

## 2019-12-25 ENCOUNTER — Other Ambulatory Visit: Payer: Self-pay | Admitting: Nurse Practitioner

## 2019-12-25 DIAGNOSIS — I10 Essential (primary) hypertension: Secondary | ICD-10-CM

## 2019-12-25 MED ORDER — MISC. DEVICES MISC
0 refills | Status: DC
Start: 2019-12-25 — End: 2023-11-09

## 2019-12-25 NOTE — Procedures (Signed)
Patient Name: Matthew Odom, Matthew Odom Date: 12/18/2019 Gender: Male D.O.B: [birth year] Age (years): 73 Referring Provider: [name] NP Height (inches): 67 Interpreting Physician: Baird Lyons MD, ABSM Weight (lbs): 250 RPSGT: Jacolyn Reedy BMI: 35 MRN: [id number] Neck Size: 18.00  CLINICAL INFORMATION Sleep Study Type: Split Night CPAP Indication for sleep study: COPD, Diabetes, Hypertension Epworth Sleepiness Score: 17  SLEEP STUDY TECHNIQUE As per the AASM Manual for the Scoring of Sleep and Associated Events v2.3 (April 2016) with a hypopnea requiring 4% desaturations.  The channels recorded and monitored were frontal, central and occipital EEG, electrooculogram (EOG), submentalis EMG (chin), nasal and oral airflow, thoracic and abdominal wall motion, anterior tibialis EMG, snore microphone, electrocardiogram, and pulse oximetry. Continuous positive airway pressure (CPAP) was initiated when the patient met split night criteria and was titrated according to treat sleep-disordered breathing.  MEDICATIONS Medications self-administered by patient taken the night of the study : none reported  RESPIRATORY PARAMETERS Diagnostic  Total AHI (/hr): 74.2 RDI (/hr): 76.1 OA Index (/hr): 18.3 CA Index (/hr): 1.9 REM AHI (/hr): 72.0 NREM AHI (/hr): 74.9 Supine AHI (/hr): N/A Non-supine AHI (/hr): 74.2 Min O2 Sat (%): 65.0 Mean O2 (%): 89.9 Time below 88% (min): 40.5   Titration  Optimal Pressure (cm): 19 AHI at Optimal Pressure (/hr): 0.0 Min O2 at Optimal Pressure (%): 93.0 Supine % at Optimal (%): 100 Sleep % at Optimal (%): 94   SLEEP ARCHITECTURE The recording time for the entire night was 361.6 minutes.  During a baseline period of 142.1 minutes, the patient slept for 124.5 minutes in REM and nonREM, yielding a sleep efficiency of 87.6%%. Sleep onset after lights out was 4.9 minutes with a REM latency of 9.0 minutes. The patient spent 32.1%% of the night in stage N1  sleep, 43.8%% in stage N2 sleep, 0.0%% in stage N3 and 24.1% in REM.  During the titration period of 218.5 minutes, the patient slept for 196.0 minutes in REM and nonREM, yielding a sleep efficiency of 89.7%%. Sleep onset after CPAP initiation was 3.3 minutes with a REM latency of 43.5 minutes. The patient spent 7.1%% of the night in stage N1 sleep, 51.5%% in stage N2 sleep, 0.0%% in stage N3 and 41.3% in REM.  CARDIAC DATA The 2 lead EKG demonstrated sinus rhythm. The mean heart rate was 100.0 beats per minute. Other EKG findings include: None.  LEG MOVEMENT DATA The total Periodic Limb Movements of Sleep (PLMS) were 0. The PLMS index was 0.0 .  IMPRESSIONS - Severe obstructive sleep apnea occurred during the diagnostic portion of the study (AHI = 74.2/hour). An optimal PAP pressure was selected for this patient ( 19 cm of water) - No significant central sleep apnea occurred during the diagnostic portion of the study (CAI = 1.9/hour). - Oxygen desaturation was noted during the diagnostic portion of the study (Min O2 = 65.0%).  Min sat on CPAP 19 was 93%. - The patient snored with moderate snoring volume during the diagnostic portion of the study. - No cardiac abnormalities were noted during this study. - Clinically significant periodic limb movements did not occur during sleep. - Rapid sleep onset and REM onset suggest sleep deprivation, consistent with severe OSA.  DIAGNOSIS - Obstructive Sleep Apnea (G47.33)  RECOMMENDATIONS - Trial of CPAP therapy on 19 cm H2O or autopap 15-20. - Patient used a Large size Fisher[employer]Paykel Full Face Mask Forma mask and heated humidification. - Be careful with alcohol, sedatives and other CNS depressants that may worsen  sleep apnea and disrupt normal sleep architecture. - Sleep hygiene should be reviewed to assess factors that may improve sleep quality. - Weight management and regular exercise should be initiated or continued.  [Electronically signed]  12/25/2019 09:44 AM  Baird Lyons MD, ABSM Diplomate, American Board of Sleep Medicine   NPI: [id number]                          [place] [place] [facility]  ELECTRONICALLY SIGNED ON:  12/25/2019, 9:40 AM [place] PH: [phone]   FX: [phone] [place]

## 2019-12-25 NOTE — Progress Notes (Signed)
Cardiology Office Note:   Date:  12/26/2019  NAME:  Matthew Odom    MRN: [id number] DOB:  1970/07/16   PCP:  [name], NP  Cardiologist:  No primary care provider on file.  Electrophysiologist:  None   Referring MD: [name], NP   Chief Complaint  Patient presents with  . Coronary Artery Disease    History of Present Illness:   Matthew Odom is a 49 y.o. male with a hx of non-obstructive CAD, HTN, systolic HF 40-98%, hypertensive heart disease, DM, cocaine abuse who is being seen today for the evaluation of systolic HF at the request of [name], NP. Non-compliant and cocaine use. Has EF 45-50% and mildly elevated troponin when in hospital with poorly controlled HTN. [place] 2017 with minimal non-obstructive CAD.   He presents today with severely elevated blood pressure 210/130.  Current medications include Coreg, hydralazine, Aldactone.  He apparently has been out of amlodipine.  He reports he took his other 3 medications today.  He reports no dizziness, lightheadedness or blurry vision.  His EKG today demonstrates sinus rhythm with LVH and repot Matthew Odom.  We did go over his heart testing from 2020 which shows severe LVH.  He has no murmurs on exam today.  He did have a left heart catheterization in 2017 with nonobstructive CAD.  He reports he still smoking a pack a day for nearly 40 years.  He has cut back on this.  He is trying to quit.  He also reports he is not using street drugs anymore.  His diabetes is not controlled but he is working on this.  He is also out of his Lipitor.  Main issue is blood pressure today.  Problem List 1. HTN 2. Systolic HF -LHC 03/9146 [facility] -30% RCA, normal LAD/LCX 3. CKD III 4. DM -A1c 8.3 -T chol 244, LDL 163, HDL 52, TG 157 5. Cocaine use   Past Medical History: Past Medical History:  Diagnosis Date  . Cocaine use   . Diabetes mellitus without complication ([place])   . Enlarged heart   . History of degenerative disc disease    . Hyperlipidemia   . Hypertension   . NSTEMI (non-ST elevated myocardial infarction) ([place])   . Sciatic nerve pain     Past Surgical History: Past Surgical History:  Procedure Laterality Date  . CARDIAC CATHETERIZATION    . NO PAST SURGERIES      Current Medications: Current Meds  Medication Sig  . albuterol (VENTOLIN HFA) 108 (90 Base) MCG/ACT inhaler Inhale 2 puffs into the lungs every 6 (six) hours as needed for wheezing or shortness of breath (or cough). ONLY AS NEEDED. Not meant for every day use  . ALPRAZolam (XANAX) 0.25 MG tablet Take by mouth.  Marland Kitchen amLODipine (NORVASC) 10 MG tablet Take 1 tablet (10 mg total) by mouth daily.  Marland Kitchen atorvastatin (LIPITOR) 40 MG tablet Take 1 tablet (40 mg total) by mouth daily.  Marland Kitchen azithromycin (ZITHROMAX) 250 MG tablet Take two once then one daily until gone  . Blood Glucose Monitoring Suppl (TRUE METRIX METER) DEVI Use to check blood sugar TID. Dx: E11.69  . budesonide-formoterol (SYMBICORT) 160-4.5 MCG/ACT inhaler Inhale 2 puffs into the lungs 2 (two) times daily.  . carvedilol (COREG) 25 MG tablet Take 1 tablet (25 mg total) by mouth 2 (two) times daily with a meal.  . hydrALAZINE (APRESOLINE) 100 MG tablet HOLD for 24 hours then resume 100mg  three times daily  .  insulin aspart protamine- aspart (NOVOLOG MIX 70/30) (70-30) 100 UNIT/ML injection Inject into the skin.  . Insulin Pen Needle 31G X 6 MM MISC 1 Device by Does not apply route 2 (two) times daily.  Marland Kitchen liraglutide (VICTOZA) 18 MG/3ML SOPN NEEDS PASS  SubQ:  Inject 0.6 mg once daily into the skin. Week 2: increase to 1.2 mg once daily; week 3: increase to 1.8 mg once daily  . Misc. Devices MISC Please provide patient with insurance approved CPAP with the following settings:  autopap 15-20.  Large size [employer] Full Face Mask Forma mask and heated humidification.  . nicotine (NICODERM CQ - DOSED IN MG/24 HOURS) 21 mg/24hr patch Place 1 patch (21 mg total) onto the skin daily.  .  nitroGLYCERIN (NITROSTAT) 0.4 MG SL tablet Place 1 tablet (0.4 mg total) under the tongue every 5 (five) minutes as needed for chest pain.  . predniSONE (DELTASONE) 20 MG tablet Take 2 tablets daily for 5 days then stop  . TRUEplus Lancets 28G MISC Use to check blood sugar TID. Dx: E11.69  . [DISCONTINUED] atorvastatin (LIPITOR) 40 MG tablet Take 1 tablet (40 mg total) by mouth daily.  . [DISCONTINUED] carvedilol (COREG) 25 MG tablet Take 1 tablet (25 mg total) by mouth 2 (two) times daily with a meal.  . [DISCONTINUED] hydrALAZINE (APRESOLINE) 100 MG tablet HOLD for 24 hours then resume 100mg  three times daily     Allergies:    Aspirin and Nitroglycerin   Social History: Social History   Socioeconomic History  . Marital status: Married    Spouse name: Not on file  . Number of children: Not on file  . Years of education: Not on file  . Highest education level: Not on file  Occupational History  . Not on file  Tobacco Use  . Smoking status: Current Every Day Smoker    Packs/day: 1.00    Years: 40.00    Pack years: 40.00    Types: Cigarettes  . Smokeless tobacco: Never Used  Substance and Sexual Activity  . Alcohol use: Yes    Comment: occasionally  . Drug use: Never  . Sexual activity: Yes  Other Topics Concern  . Not on file  Social History Narrative  . Not on file   Social Determinants of Health   Financial Resource Strain:   . Difficulty of Paying Living Expenses: Not on file  Food Insecurity:   . Worried About Charity fundraiser in the Last Year: Not on file  . Ran Out of Food in the Last Year: Not on file  Transportation Needs:   . Lack of Transportation (Medical): Not on file  . Lack of Transportation (Non-Medical): Not on file  Physical Activity:   . Days of Exercise per Week: Not on file  . Minutes of Exercise per Session: Not on file  Stress:   . Feeling of Stress : Not on file  Social Connections:   . Frequency of Communication with Friends and Family:  Not on file  . Frequency of Social Gatherings with Friends and Family: Not on file  . Attends Religious Services: Not on file  . Active Member of Clubs or Organizations: Not on file  . Attends Archivist Meetings: Not on file  . Marital Status: Not on file     Family History: The patient's family history includes Heart attack in his mother; Heart disease in his sister.  ROS:   All other ROS reviewed and negative. Pertinent positives noted  in the HPI.     EKGs/Labs/Other Studies Reviewed:   The following studies were personally reviewed by me today:  EKG:  EKG is ordered today.  The ekg ordered today demonstrates normal sinus rhythm, heart rate 99, LVH with repolarization abnormality, and was personally reviewed by me.   TTE 06/29/2018 1. The left ventricle has mildly reduced systolic function, with an  ejection fraction of 45-50%. The cavity size was normal. There is severely  increased left ventricular wall thickness. Left ventricular diastolic  Doppler parameters are consistent with  pseudonormalization Left ventricular diffuse hypokinesis. Appearance of  the left ventricle is concerning for poorly controlled hypertension versus  cardiac amyloidosis.  2. The right ventricle has normal systolic function. The cavity was  normal. There is no increase in right ventricular wall thickness.  3. Left atrial size was moderately dilated.  4. Right atrial size was mildly dilated.  5. The mitral valve is normal in structure. There is mild mitral annular  calcification present. No evidence of mitral valve stenosis. No  regurgitation.  6. The tricuspid valve is normal in structure.  7. The aortic valve is tricuspid Mild calcification of the aortic valve.  no stenosis of the aortic valve.  8. The pulmonic valve was normal in structure.  9. There is mild dilatation of the ascending aorta measuring 41 mm.  10. No complete TR doppler jet so unable to estimate PA systolic  pressure.  Normal IVC.   LHC 02/15/2016 ([facility]) LM: normal  LAD: normal LCX: normal RCA: 30%  Recent Labs: 11/28/2019: ALT 19; BUN 32; Creatinine, Ser 2.64; Hemoglobin 14.5; Platelets 259; Potassium 3.9; Sodium 137   Recent Lipid Panel    Component Value Date/Time   CHOL 244 (H) 10/07/2019 1115   TRIG 157 (H) 10/07/2019 1115   HDL 52 10/07/2019 1115   CHOLHDL 4.7 10/07/2019 1115   CHOLHDL 6.1 01/03/2018 0335   VLDL 34 01/03/2018 0335   LDLCALC 163 (H) 10/07/2019 1115    Physical Exam:   VS:  BP (!) 210/130   Pulse 99   Ht 5\' 11"  (1.803 m)   Wt 248 lb (112.5 kg)   SpO2 96%   BMI 34.59 kg/m    Wt Readings from Last 3 Encounters:  12/26/19 248 lb (112.5 kg)  12/18/19 250 lb (113.4 kg)  11/28/19 (!) 244 lb (110.7 kg)    General: Well nourished, well developed, in no acute distress Heart: Atraumatic, normal size  Eyes: PEERLA, EOMI  Neck: Supple, no JVD Endocrine: No thryomegaly Cardiac: Normal S1, S2; RRR; no murmurs, rubs, or gallops Lungs: Clear to auscultation bilaterally, no wheezing, rhonchi or rales  Abd: Soft, nontender, no hepatomegaly  Ext: No edema, pulses 2+ Musculoskeletal: No deformities, BUE and BLE strength normal and equal Skin: Warm and dry, no rashes   Neuro: Alert and oriented to person, place, time, and situation, CNII-XII grossly intact, no focal deficits  Psych: Normal mood and affect   ASSESSMENT:   Matthew Odom is a 49 y.o. male who presents for the following: 1. Essential hypertension   2. Chronic combined systolic and diastolic CHF, NYHA class 3 ([place])   3. Mixed hyperlipidemia   4. Coronary artery disease involving native coronary artery of native heart without angina pectoris   5. Type 2 diabetes mellitus with other specified complication, with long-term current use of insulin (HCC)     PLAN:   1. Essential hypertension 2. Chronic combined systolic and diastolic CHF, NYHA class 3 (HCC) -Severely  uncontrolled high blood pressure.   210/130 today.  No symptoms.  EKG with LVH.  He had an echocardiogram last year which showed severe LVH and EF 45 to 50%.  This is undoubtedly related to hypertension.  He left heart catheterization in 2017 with nonobstructive CAD. -He describes no symptoms.  He is only on Coreg, hydralazine and Aldactone. 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  I recommended to start nifedipine 90 mg extended release tablet.  He will continue Coreg 25 mg twice daily, hydralazine 100 mg 3 times daily, Aldactone 50 mg daily. -I would like for him to get reestablished with nephrology. -We cannot use an ACE or ARB due to his CKD stage III/IV.  Clonidine likely as our next option. -I will plan to see him back in 2 to 3 weeks for close monitoring of his blood pressure.  3. Mixed hyperlipidemia -Refill Lipitor.  4. Coronary artery disease involving native coronary artery of native heart without angina pectoris -Nonobstructive CAD.  Need to get his LDL less than 70 given diabetes and nonobstructive CAD.  Continue Lipitor 40 mg daily.  We will further titrate this.  Disposition: Return in about 2 weeks (around 01/09/2020).  Medication Adjustments/Labs and Tests Ordered: Current medicines are reviewed at length with the patient today.  Concerns regarding medicines are outlined above.  Orders Placed This Encounter  Procedures  . EKG 12-Lead   Meds ordered this encounter  Medications  . NIFEdipine (PROCARDIA XL/NIFEDICAL-XL) 90 MG 24 hr tablet    Sig: Take 1 tablet (90 mg total) by mouth daily.    Dispense:  90 tablet    Refill:  1  . spironolactone (ALDACTONE) 50 MG tablet    Sig: Take 1 tablet (50 mg total) by mouth daily.    Dispense:  30 tablet    Refill:  1  . atorvastatin (LIPITOR) 40 MG tablet    Sig: Take 1 tablet (40 mg total) by mouth daily.    Dispense:  90 tablet    Refill:  1  . carvedilol (COREG) 25 MG tablet    Sig: Take 1 tablet (25 mg total) by mouth 2 (two) times daily with a meal.    Dispense:  60  tablet    Refill:  3  . hydrALAZINE (APRESOLINE) 100 MG tablet    Sig: HOLD for 24 hours then resume 100mg  three times daily    Dispense:  90 tablet    Refill:  1    Patient Instructions  Medication Instructions:  Start Nifedipine 90 mg daily  The current medical regimen is effective;  continue present plan and medications.  *If you need a refill on your cardiac medications before your next appointment, please call your pharmacy*  Follow-Up: At [facility], you and your health needs are our priority.  As part of our continuing mission to provide you with exceptional heart care, we have created designated Provider Care Teams.  These Care Teams include your primary Cardiologist (physician) and Advanced Practice Providers (APPs -  Physician Assistants and Nurse Practitioners) who all work together to provide you with the care you need, when you need it.  We recommend signing up for the patient portal called "MyChart".  Sign up information is provided on this After Visit Summary.  MyChart is used to connect with patients for Virtual Visits (Telemedicine).  Patients are able to view lab/test results, encounter notes, upcoming appointments, etc.  Non-urgent messages can be sent to your provider as well.   To learn  more about what you can do with MyChart, go to [email or web address].    Your next appointment:   2-3 week(s)  The format for your next appointment:   In Person  Provider:   Eleonore Chiquito, MD        Signed, Addison Naegeli. Audie Box, [place]  [address], [place], [place] [address] [phone]  12/26/2019 4:25 PM

## 2019-12-26 ENCOUNTER — Other Ambulatory Visit: Payer: Self-pay | Admitting: Cardiovascular Disease

## 2019-12-26 ENCOUNTER — Ambulatory Visit (INDEPENDENT_AMBULATORY_CARE_PROVIDER_SITE_OTHER): Payer: Self-pay | Admitting: Cardiovascular Disease

## 2019-12-26 ENCOUNTER — Encounter: Payer: Self-pay | Admitting: Cardiovascular Disease

## 2019-12-26 ENCOUNTER — Other Ambulatory Visit: Payer: Self-pay

## 2019-12-26 VITALS — BP 210/130 | HR 99 | Ht 71.0 in | Wt 248.0 lb

## 2019-12-26 DIAGNOSIS — Z794 Long term (current) use of insulin: Secondary | ICD-10-CM

## 2019-12-26 DIAGNOSIS — E782 Mixed hyperlipidemia: Secondary | ICD-10-CM

## 2019-12-26 DIAGNOSIS — I2510 Atherosclerotic heart disease of native coronary artery without angina pectoris: Secondary | ICD-10-CM

## 2019-12-26 DIAGNOSIS — I5042 Chronic combined systolic (congestive) and diastolic (congestive) heart failure: Secondary | ICD-10-CM

## 2019-12-26 DIAGNOSIS — I10 Essential (primary) hypertension: Secondary | ICD-10-CM

## 2019-12-26 DIAGNOSIS — E1169 Type 2 diabetes mellitus with other specified complication: Secondary | ICD-10-CM

## 2019-12-26 MED ORDER — CARVEDILOL 25 MG PO TABS: 25.0000 mg | ORAL_TABLET | Freq: Two times a day (BID) | ORAL | 3 refills | Status: DC

## 2019-12-26 MED ORDER — NIFEDIPINE ER OSMOTIC RELEASE 90 MG PO TB24
90.0000 mg | ORAL_TABLET | Freq: Every day | ORAL | 1 refills | Status: DC
Start: 2019-12-26 — End: 2020-01-12

## 2019-12-26 MED ORDER — SPIRONOLACTONE 50 MG PO TABS: 50.0000 mg | ORAL_TABLET | Freq: Every day | ORAL | 1 refills | Status: DC

## 2019-12-26 MED ORDER — ATORVASTATIN CALCIUM 40 MG PO TABS
40.0000 mg | ORAL_TABLET | Freq: Every day | ORAL | 1 refills | Status: DC
Start: 2019-12-26 — End: 2019-12-26

## 2019-12-26 MED ORDER — HYDRALAZINE HCL 100 MG PO TABS: ORAL_TABLET | ORAL | 1 refills | Status: DC

## 2019-12-26 MED FILL — ?CARVEDILOL 25 MG TABLET: 25 | 30 days supply | Qty: 60 | Fill #0

## 2019-12-26 MED FILL — NIFEdipine ER OSMOTIC RELEA: 90 | 90 days supply | Qty: 90 | Fill #0

## 2019-12-26 MED FILL — hydrALAZINE HCL 100 MG TABS: 100 | 30 days supply | Qty: 90 | Fill #0

## 2019-12-26 MED FILL — ?SPIRONOLACTONE 50 MG TABLE: 50 | 30 days supply | Qty: 30 | Fill #0

## 2019-12-26 MED FILL — ?ATORVASTATIN 40MG TABLET: 40 | 30 days supply | Qty: 30 | Fill #0

## 2019-12-26 NOTE — Patient Instructions (Signed)
Medication Instructions:  Start Nifedipine 90 mg daily  The current medical regimen is effective;  continue present plan and medications.  *If you need a refill on your cardiac medications before your next appointment, please call your pharmacy*  Follow-Up: At [facility], you and your health needs are our priority.  As part of our continuing mission to provide you with exceptional heart care, we have created designated Provider Care Teams.  These Care Teams include your primary Cardiologist (physician) and Advanced Practice Providers (APPs -  Physician Assistants and Nurse Practitioners) who all work together to provide you with the care you need, when you need it.  We recommend signing up for the patient portal called "MyChart".  Sign up information is provided on this After Visit Summary.  MyChart is used to connect with patients for Virtual Visits (Telemedicine).  Patients are able to view lab/test results, encounter notes, upcoming appointments, etc.  Non-urgent messages can be sent to your provider as well.   To learn more about what you can do with MyChart, go to [email or web address].    Your next appointment:   2-3 week(s)  The format for your next appointment:   In Person  Provider:   Eleonore Chiquito, MD

## 2019-12-27 ENCOUNTER — Telehealth: Payer: Self-pay | Admitting: Licensed Clinical Social Worker

## 2019-12-27 NOTE — Telephone Encounter (Signed)
CSW received referral to assist patient with medication assistance. CSW contacted patient and message left for return call. Raquel Sarna, [place], [place]

## 2019-12-29 ENCOUNTER — Telehealth: Payer: Self-pay | Admitting: Licensed Clinical Social Worker

## 2019-12-29 MED FILL — !SYMBICORT 160-4.5 MCG INH: 160-4.5 | 30 days supply | Qty: 1 | Fill #1

## 2019-12-29 MED FILL — predniSONE 20 MG TABS: 20 | 5 days supply | Qty: 10 | Fill #0

## 2019-12-29 MED FILL — !VICTOZA 18MG/3ML INJECT: 18 | 17 days supply | Qty: 3 | Fill #1

## 2019-12-29 NOTE — Telephone Encounter (Signed)
CSW received return call from patient who is in need of medication assistance. Patient shared that he uses [facility] and has met with the financial counselor and has been approved for the [employer]. Patient asked to return call to the pharmacy to insure they have the blue card listed for his account and verify the total cost of medications. Patient will return call to CSW for assistance. Raquel Sarna, [place], [place]

## 2019-12-29 NOTE — Telephone Encounter (Signed)
Patient returned call to let CSW know medications ready at [facility] and confirmed [facility] applied to his account. Patient's medication cost is $34 and CSW will assist with support from Patient Care Fund. Patient grateful for the support. Raquel Sarna, [place], [place]

## 2019-12-30 MED FILL — AMLODIPINE BESYLATE 10 MG T: 10 | 30 days supply | Qty: 30 | Fill #1

## 2020-01-12 ENCOUNTER — Ambulatory Visit (INDEPENDENT_AMBULATORY_CARE_PROVIDER_SITE_OTHER): Payer: Self-pay | Admitting: Cardiovascular Disease

## 2020-01-12 ENCOUNTER — Other Ambulatory Visit: Payer: Self-pay | Admitting: Cardiovascular Disease

## 2020-01-12 ENCOUNTER — Encounter: Payer: Self-pay | Admitting: Cardiovascular Disease

## 2020-01-12 ENCOUNTER — Other Ambulatory Visit: Payer: Self-pay

## 2020-01-12 VITALS — BP 138/92 | HR 93 | Ht 71.0 in | Wt 248.8 lb

## 2020-01-12 DIAGNOSIS — I5042 Chronic combined systolic (congestive) and diastolic (congestive) heart failure: Secondary | ICD-10-CM

## 2020-01-12 DIAGNOSIS — I110 Hypertensive heart disease with heart failure: Secondary | ICD-10-CM

## 2020-01-12 DIAGNOSIS — I10 Essential (primary) hypertension: Secondary | ICD-10-CM

## 2020-01-12 MED ORDER — DOXAZOSIN MESYLATE 4 MG PO TABS
4.0000 mg | ORAL_TABLET | Freq: Every day | ORAL | 1 refills | Status: DC
Start: 2020-01-12 — End: 2020-01-12

## 2020-01-12 MED FILL — DOXAZOSIN MESYLATE 4 MG TAB: 4 | 30 days supply | Qty: 30 | Fill #0

## 2020-01-12 NOTE — Patient Instructions (Addendum)
Medication Instructions:  STOP NIFEDIPINE  START DOXAZOSIN 4 MG AT BEDTIME    Labwork: TSH SOON    Testing/Procedures: Your physician has requested that you have a renal artery duplex. During this test, an ultrasound is used to evaluate blood flow to the kidneys. Allow one hour for this exam. Do not eat after midnight the day before and avoid carbonated beverages. Take your medications as you usually do.   Follow-Up: 1 MONTH WITH PHARM D 02/14/2020 AT 8:30 AM    You will receive a phone call from the PREP exercise and nutrition program to schedule an initial assessment.   Special Instructions:   MONITOR AND LOG YOUR BLOOD PRESSURE TWICE A DAY, BRING BOOK AND BLOOD PRESSURE MACHINE TO FOLLOW UP IN 1 MONTH   DASH Eating Plan DASH stands for "Dietary Approaches to Stop Hypertension." The DASH eating plan is a healthy eating plan that has been shown to reduce high blood pressure (hypertension). It may also reduce your risk for type 2 diabetes, heart disease, and stroke. The DASH eating plan may also help with weight loss. What are tips for following this plan?  General guidelines  Avoid eating more than 2,300 mg (milligrams) of salt (sodium) a day. If you have hypertension, you may need to reduce your sodium intake to 1,500 mg a day.  Limit alcohol intake to no more than 1 drink a day for nonpregnant women and 2 drinks a day for men. One drink equals 12 oz of beer, 5 oz of wine, or 1 oz of hard liquor.  Work with your health care provider to maintain a healthy body weight or to lose weight. Ask what an ideal weight is for you.  Get at least 30 minutes of exercise that causes your heart to beat faster (aerobic exercise) most days of the week. Activities may include walking, swimming, or biking.  Work with your health care provider or diet and nutrition specialist (dietitian) to adjust your eating plan to your individual calorie needs. Reading food labels   Check food labels for the  amount of sodium per serving. Choose foods with less than 5 percent of the Daily Value of sodium. Generally, foods with less than 300 mg of sodium per serving fit into this eating plan.  To find whole grains, look for the word "whole" as the first word in the ingredient list. Shopping  Buy products labeled as "low-sodium" or "no salt added."  Buy fresh foods. Avoid canned foods and premade or frozen meals. Cooking  Avoid adding salt when cooking. Use salt-free seasonings or herbs instead of table salt or sea salt. Check with your health care provider or pharmacist before using salt substitutes.  Do not fry foods. Cook foods using healthy methods such as baking, boiling, grilling, and broiling instead.  Cook with heart-healthy oils, such as olive, canola, soybean, or sunflower oil. Meal planning  Eat a balanced diet that includes: ? 5 or more servings of fruits and vegetables each day. At each meal, try to fill half of your plate with fruits and vegetables. ? Up to 6-8 servings of whole grains each day. ? Less than 6 oz of lean meat, poultry, or fish each day. A 3-oz serving of meat is about the same size as a deck of cards. One egg equals 1 oz. ? 2 servings of low-fat dairy each day. ? A serving of nuts, seeds, or beans 5 times each week. ? Heart-healthy fats. Healthy fats called Omega-3 fatty acids are found  in foods such as flaxseeds and coldwater fish, like sardines, salmon, and mackerel.  Limit how much you eat of the following: ? Canned or prepackaged foods. ? Food that is high in trans fat, such as fried foods. ? Food that is high in saturated fat, such as fatty meat. ? Sweets, desserts, sugary drinks, and other foods with added sugar. ? Full-fat dairy products.  Do not salt foods before eating.  Try to eat at least 2 vegetarian meals each week.  Eat more home-cooked food and less restaurant, buffet, and fast food.  When eating at a restaurant, ask that your food be  prepared with less salt or no salt, if possible. What foods are recommended? The items listed may not be a complete list. Talk with your dietitian about what dietary choices are best for you. Grains Whole-grain or whole-wheat bread. Whole-grain or whole-wheat pasta. Brown rice. [place]. Bulgur. Whole-grain and low-sodium cereals. Pita bread. Low-fat, low-sodium crackers. Whole-wheat flour tortillas. Vegetables Fresh or frozen vegetables (raw, steamed, roasted, or grilled). Low-sodium or reduced-sodium tomato and vegetable juice. Low-sodium or reduced-sodium tomato sauce and tomato paste. Low-sodium or reduced-sodium canned vegetables. Fruits All fresh, dried, or frozen fruit. Canned fruit in natural juice (without added sugar). Meat and other protein foods Skinless chicken or [place]. Ground chicken or [place]. Pork with fat trimmed off. Fish and seafood. Egg whites. Dried beans, peas, or lentils. Unsalted nuts, nut butters, and seeds. Unsalted canned beans. Lean cuts of beef with fat trimmed off. Low-sodium, lean deli meat. Dairy Low-fat (1%) or fat-free (skim) milk. Fat-free, low-fat, or reduced-fat cheeses. Nonfat, low-sodium ricotta or cottage cheese. Low-fat or nonfat yogurt. Low-fat, low-sodium cheese. Fats and oils Soft margarine without trans fats. Vegetable oil. Low-fat, reduced-fat, or light mayonnaise and salad dressings (reduced-sodium). Canola, safflower, olive, soybean, and sunflower oils. Avocado. Seasoning and other foods Herbs. Spices. Seasoning mixes without salt. Unsalted popcorn and pretzels. Fat-free sweets. What foods are not recommended? The items listed may not be a complete list. Talk with your dietitian about what dietary choices are best for you. Grains Baked goods made with fat, such as croissants, muffins, or some breads. Dry pasta or rice meal packs. Vegetables Creamed or fried vegetables. Vegetables in a cheese sauce. Regular canned vegetables (not  low-sodium or reduced-sodium). Regular canned tomato sauce and paste (not low-sodium or reduced-sodium). Regular tomato and vegetable juice (not low-sodium or reduced-sodium). Angie Fava. Olives. Fruits Canned fruit in a light or heavy syrup. Fried fruit. Fruit in cream or butter sauce. Meat and other protein foods Fatty cuts of meat. Ribs. Fried meat. Berniece Salines. Sausage. Bologna and other processed lunch meats. Salami. Fatback. Hotdogs. Bratwurst. Salted nuts and seeds. Canned beans with added salt. Canned or smoked fish. Whole eggs or egg yolks. Chicken or [place] with skin. Dairy Whole or 2% milk, cream, and half-and-half. Whole or full-fat cream cheese. Whole-fat or sweetened yogurt. Full-fat cheese. Nondairy creamers. Whipped toppings. Processed cheese and cheese spreads. Fats and oils Butter. Stick margarine. Lard. Shortening. Ghee. Bacon fat. Tropical oils, such as coconut, palm kernel, or palm oil. Seasoning and other foods Salted popcorn and pretzels. Onion salt, garlic salt, seasoned salt, table salt, and sea salt. Worcestershire sauce. Tartar sauce. Barbecue sauce. Teriyaki sauce. Soy sauce, including reduced-sodium. Steak sauce. Canned and packaged gravies. Fish sauce. Oyster sauce. Cocktail sauce. Horseradish that you find on the shelf. Ketchup. Mustard. Meat flavorings and tenderizers. Bouillon cubes. Hot sauce and Tabasco sauce. Premade or packaged marinades. Premade or packaged taco seasonings. Relishes.  Regular salad dressings. Where to find more information:  National Heart, Lung, and [place]: https://wilson-eaton.com/  American Heart Association: www.heart.org Summary  The DASH eating plan is a healthy eating plan that has been shown to reduce high blood pressure (hypertension). It may also reduce your risk for type 2 diabetes, heart disease, and stroke.  With the DASH eating plan, you should limit salt (sodium) intake to 2,300 mg a day. If you have hypertension, you may need to reduce  your sodium intake to 1,500 mg a day.  When on the DASH eating plan, aim to eat more fresh fruits and vegetables, whole grains, lean proteins, low-fat dairy, and heart-healthy fats.  Work with your health care provider or diet and nutrition specialist (dietitian) to adjust your eating plan to your individual calorie needs. This information is not intended to replace advice given to you by your health care provider. Make sure you discuss any questions you have with your health care provider. Document Released: 04/10/2011 Document Revised: 04/03/2017 Document Reviewed: 04/14/2016 Elsevier Patient Education  2020 [employer].

## 2020-01-12 NOTE — Progress Notes (Signed)
Hypertension Clinic Initial Assessment:    Date:  01/12/2020   ID:  Matthew Odom, DOB [birth year], MRN [id number]  PCP:  Matthew Pounds, NP  Cardiologist:  No primary care provider on file.  Nephrologist:  Referring MD: Matthew Pounds, NP   CC: Hypertension  History of Present Illness:    Matthew Odom is a 49 y.o. male with a hx of nonobstructive CAD, chronic systolic diastolic heart failure (LVEF 45 to 50%), CKD 3B, hypertension, diabetes, tobacco abuse, and cocaine abuse here to establish care in the hypertension clinic.  He reports first being diagnosed with hypertension in his 63s.  It has never been very well-controlled.  He was initially started on hydrochlorothiazide and became frustrated because it was not working.  He tried to tell providers that they continue to give it to him.  He stopped taking his medicines altogether.  He saw Dr. Raul [facility] with [facility] 10/2019 and had not been following up with any primary care provider.  He was started on clonidine, amlodipine, carvedilol, hydralazine, and spironolactone.  At that time his blood pressure was 220/135.  He followed up the following month and was hypotensive.  He received 500 mL and was instructed to hold hydralazine for 48 hours.  He saw Dr. Audie Odom on 8/23 after hospitalization for poorly controlled hypertension and mildly elevated troponin.  He had a cath in 2017 that revealed minimal nonobstructive CAD.  When he saw Dr. Gordy Odom his blood pressure was elevated to 210/130.  He was out of his amlodipine at the time.  He was started on nifedipine.  He was also referred to nephrology.  He did not understand that he should stop taking amlodipine at the time and has been taking both nifedipine and amlodipine.  He has a BP machine but isn't able to access it.  He checks it at the pharmacy and it was 132/90s.  His BP has been better on nifedipine but he has some ankle edema and he has been very tired.  He has  been getting a little exercise.  He walks around his neighborhood once per day for about 5-10 minutes.  He isn't adding any salt and avoids pork.  He is eating more vegetables and fresh foods.  He reports apnea and doesn't feel rested in the am.  He was diagnosed with OSA and is awaiting a CPAP machine.  He continues to smoke but denies any recent illicit substances.  Previous antihypertensives: HCTZ- didn't help  Past Medical History:  Diagnosis Date  . Cocaine use   . Diabetes mellitus without complication ([place])   . Enlarged heart   . History of degenerative disc disease   . Hyperlipidemia   . Hypertension   . NSTEMI (non-ST elevated myocardial infarction) ([place])   . Sciatic nerve pain     Past Surgical History:  Procedure Laterality Date  . CARDIAC CATHETERIZATION    . NO PAST SURGERIES      Current Medications: Current Meds  Medication Sig  . albuterol (VENTOLIN HFA) 108 (90 Base) MCG/ACT inhaler Inhale 2 puffs into the lungs every 6 (six) hours as needed for wheezing or shortness of breath (or cough). ONLY AS NEEDED. Not meant for every day use  . ALPRAZolam (XANAX) 0.25 MG tablet Take by mouth.  Marland Kitchen amLODipine (NORVASC) 10 MG tablet Take 1 tablet (10 mg total) by mouth daily.  Marland Kitchen atorvastatin (LIPITOR) 40 MG tablet Take 1 tablet (40 mg total) by mouth  daily.  . Blood Glucose Monitoring Suppl (TRUE METRIX METER) DEVI Use to check blood sugar TID. Dx: E11.69  . budesonide-formoterol (SYMBICORT) 160-4.5 MCG/ACT inhaler Inhale 2 puffs into the lungs 2 (two) times daily.  . carvedilol (COREG) 25 MG tablet Take 1 tablet (25 mg total) by mouth 2 (two) times daily with a meal.  . hydrALAZINE (APRESOLINE) 100 MG tablet HOLD for 24 hours then resume 100mg  three times daily  . insulin aspart protamine- aspart (NOVOLOG MIX 70/30) (70-30) 100 UNIT/ML injection Inject into the skin.  . Insulin Pen Needle 31G X 6 MM MISC 1 Device by Does not apply route 2 (two) times daily.  Marland Kitchen liraglutide  (VICTOZA) 18 MG/3ML SOPN NEEDS PASS  SubQ:  Inject 0.6 mg once daily into the skin. Week 2: increase to 1.2 mg once daily; week 3: increase to 1.8 mg once daily  . Misc. Devices MISC Please provide patient with insurance approved CPAP with the following settings:  autopap 15-20.  Large size Fisher[employer]kel Full Face Mask Forma mask and heated humidification.  . nicotine (NICODERM CQ - DOSED IN MG/24 HOURS) 21 mg/24hr patch Place 1 patch (21 mg total) onto the skin daily.  . nitroGLYCERIN (NITROSTAT) 0.4 MG SL tablet Place 1 tablet (0.4 mg total) under the tongue every 5 (five) minutes as needed for chest pain.  . predniSONE (DELTASONE) 20 MG tablet Take 2 tablets daily for 5 days then stop  . spironolactone (ALDACTONE) 50 MG tablet Take 1 tablet (50 mg total) by mouth daily.  . TRUEplus Lancets 28G MISC Use to check blood sugar TID. Dx: E11.69  . [DISCONTINUED] azithromycin (ZITHROMAX) 250 MG tablet Take two once then one daily until gone  . [DISCONTINUED] NIFEdipine (PROCARDIA XL/NIFEDICAL-XL) 90 MG 24 hr tablet Take 1 tablet (90 mg total) by mouth daily.     Allergies:   Aspirin and Nitroglycerin   Social History   Socioeconomic History  . Marital status: Married    Spouse name: Not on file  . Number of children: Not on file  . Years of education: Not on file  . Highest education level: Not on file  Occupational History  . Not on file  Tobacco Use  . Smoking status: Current Every Day Smoker    Packs/day: 1.00    Years: 40.00    Pack years: 40.00    Types: Cigarettes  . Smokeless tobacco: Never Used  Substance and Sexual Activity  . Alcohol use: Yes    Comment: occasionally  . Drug use: Never  . Sexual activity: Yes  Other Topics Concern  . Not on file  Social History Narrative  . Not on file   Social Determinants of Health   Financial Resource Strain:   . Difficulty of Paying Living Expenses: Not on file  Food Insecurity:   . Worried About Charity fundraiser in the Last  Year: Not on file  . Ran Out of Food in the Last Year: Not on file  Transportation Needs:   . Lack of Transportation (Medical): Not on file  . Lack of Transportation (Non-Medical): Not on file  Physical Activity:   . Days of Exercise per Week: Not on file  . Minutes of Exercise per Session: Not on file  Stress:   . Feeling of Stress : Not on file  Social Connections:   . Frequency of Communication with Friends and Family: Not on file  . Frequency of Social Gatherings with Friends and Family: Not on file  .  Attends Religious Services: Not on file  . Active Member of Clubs or Organizations: Not on file  . Attends Archivist Meetings: Not on file  . Marital Status: Not on file     Family History: The patient's family history includes Diabetes in his mother; Emphysema in his father; Heart attack in his mother; Heart disease in his sister; Hypertension in his father, mother, and sister.  ROS:   Please see the history of present illness.     All other systems reviewed and are negative.  EKGs/Labs/Other Studies Reviewed:    EKG:  EKG is not ordered today.  The ekg ordered 12/26/19 demonstrates sinus rhythm.  Rate 99 bpm.  LVH with secondary repolarization abnormalities  Recent Labs: 11/28/2019: ALT 19; BUN 32; Creatinine, Ser 2.64; Hemoglobin 14.5; Platelets 259; Potassium 3.9; Sodium 137   Recent Lipid Panel    Component Value Date/Time   CHOL 244 (H) 10/07/2019 1115   TRIG 157 (H) 10/07/2019 1115   HDL 52 10/07/2019 1115   CHOLHDL 4.7 10/07/2019 1115   CHOLHDL 6.1 01/03/2018 0335   VLDL 34 01/03/2018 0335   LDLCALC 163 (H) 10/07/2019 1115    Physical Exam:   VS:  BP (!) 138/92   Pulse 93   Ht 5\' 11"  (1.803 m)   Wt 248 lb 12.8 oz (112.9 kg)   SpO2 96%   BMI 34.70 kg/m  , BMI Body mass index is 34.7 kg/m. GENERAL:  Well appearing HEENT: Pupils equal round and reactive, fundi not visualized, oral mucosa unremarkable NECK:  No jugular venous distention, waveform  within normal limits, carotid upstroke brisk and symmetric, no bruits LUNGS:  Clear to auscultation bilaterally HEART:  RRR.  PMI not displaced or sustained,S1 and S2 within normal limits, no S3, no S4, no clicks, no rubs, no murmurs ABD:  Flat, positive bowel sounds normal in frequency in pitch, no bruits, no rebound, no guarding, no midline pulsatile mass, no hepatomegaly, no splenomegaly EXT:  2 plus pulses throughout, no edema, no cyanosis no clubbing SKIN:  No rashes no nodules NEURO:  Cranial nerves II through XII grossly intact, motor grossly intact throughout PSYCH:  Cognitively intact, oriented to person place and time   ASSESSMENT:    1. Essential hypertension   2. Chronic combined systolic and diastolic CHF, NYHA class 3 ([place])   3. 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 Langham BP has very elevated blood pressure but it is much better today, though not at goal.  We had a frank discussion about the importance of controlling his BP and the fact that it is affecting his kidney function and heart.  We will check renal artery Dopplers and a TSH.  He is taking both amlodipine and nifedipine and has more swelling. We will stop the nifedipine and continue amlodipine.  Continue carvedilol, hydralazine and spironolactone.  He is going to work on increasing his exercise. he is interested in enrolling in the PREP exercise and nutrition program through the [facility].    He will continue to abstain from illicit substances.  He is using nicotine patches for smoking cessation.   Secondary Causes of Hypertension  Medications/Herbal: OCP, steroids, stimulants, antidepressants, weight loss medication, immune suppressants, NSAIDs, sympathomimetics, alcohol, caffeine, licorice, ginseng, St. John's wort, chemo (no more cocaine, attempting smoking cessation) Sleep Apnea: CPAP pending Renal artery stenosis:  check renal artery Dopplers Hyperaldosteronism: Normal 01/2018 Hyper/hypothyroidism:  Check TSH Pheochromocytoma: palpitations, tachycardia, headache, diaphoresis (plasma metanephrines) Cushing's syndrome: (testing not indicated)  Coarctation of the aorta: BP symmetric  he consents to be monitored in our remote patient monitoring program through [place].  he will track his blood pressure twice daily and understands that these trends will help [place] to adjust his medications as needed prior to his next appointment.     Disposition:    FU with MD/PharmD in 1 month    Medication Adjustments/Labs and Tests Ordered: Current medicines are reviewed at length with the patient today.  Concerns regarding medicines are outlined above.  Orders Placed This Encounter  Procedures  . TSH  . VAS US RENAL ARTERY DUPLEX   Meds ordered this encounter  Medications  . doxazosin (CARDURA) 4 MG tablet    Sig: Take 1 tablet (4 mg total) by mouth at bedtime.    Dispense:  90 tablet    Refill:  1    D/C NIFEDIPINE     Signed, Skeet Latch, MD  01/12/2020 5:34 PM    [place]

## 2020-01-13 ENCOUNTER — Telehealth: Payer: Self-pay

## 2020-01-13 NOTE — Telephone Encounter (Signed)
Call placed to patient reference PREP referral  S/w wife. LM requesting cb to discuss. Given number for cb.

## 2020-01-18 ENCOUNTER — Ambulatory Visit: Payer: Self-pay | Admitting: Cardiovascular Disease

## 2020-01-20 ENCOUNTER — Ambulatory Visit: Payer: Self-pay | Attending: Family Medicine | Admitting: Family Medicine

## 2020-01-20 ENCOUNTER — Encounter: Payer: Self-pay | Admitting: Family Medicine

## 2020-01-20 ENCOUNTER — Other Ambulatory Visit: Payer: Self-pay

## 2020-01-20 VITALS — BP 170/92 | HR 78 | Ht 71.0 in | Wt 249.0 lb

## 2020-01-20 DIAGNOSIS — I10 Essential (primary) hypertension: Secondary | ICD-10-CM

## 2020-01-20 DIAGNOSIS — H6591 Unspecified nonsuppurative otitis media, right ear: Secondary | ICD-10-CM

## 2020-01-20 DIAGNOSIS — H9201 Otalgia, right ear: Secondary | ICD-10-CM

## 2020-01-20 MED ORDER — AMOXICILLIN-POT CLAVULANATE 500-125 MG PO TABS: 1.0000 | ORAL_TABLET | Freq: Two times a day (BID) | ORAL | 0 refills | Status: AC

## 2020-01-20 MED ORDER — TRAMADOL HCL 50 MG PO TABS: 50.0000 mg | ORAL_TABLET | Freq: Three times a day (TID) | ORAL | 0 refills | Status: AC | PRN

## 2020-01-20 MED FILL — AMOX-CLAV 500-125 MG TABLET: 500-125 | 7 days supply | Qty: 14 | Fill #0

## 2020-01-20 MED FILL — traMADol HCL 50 MG TABS: 50 | 5 days supply | Qty: 15 | Fill #0

## 2020-01-20 MED FILL — !VICTOZA 18MG/3ML INJECT: 18 | 10 days supply | Qty: 3 | Fill #0

## 2020-01-20 NOTE — Progress Notes (Signed)
Having pain in right ear.

## 2020-01-20 NOTE — Progress Notes (Signed)
Established Patient Office Visit  Subjective:  Patient ID: Matthew Odom, male    DOB: 03-09-1971  Age: 49 y.o. MRN: [id number]  CC:  Chief Complaint  Patient presents with  . Ear Pain    HPI Matthew Odom, 49 year old male, patient of [name], NP, who presents secondary to complaint of a 1 week history of right ear pain which has become progressively worse.  He has had no pain with ear movement and no drainage from the ear.  He reports a history of recurrent ear infections.  He denies any nasal congestion.  No fever or chills and no headache or dizziness.  Ear pain has become sharp and is about a 9 on a 0-to-10 scale.  Ear pain occurs off and on but is occurring more frequently over the past 24 hours.  He denies hearing loss but states that hearing sounds slightly muffled at times as if he has water in his ears.  He now also feels as if there is pain above and slightly behind the ear as well.  He denies any sore throat, no cough or shortness of breath.  He has been taking Tylenol to help with the ear pain but this is not really helped.  He would like to have a stronger medicine to help with the pain.  He believes that his blood pressure is likely elevated at today's visit secondary to his ear pain.  He has had chronic issues however with elevated blood pressure but he reports that he is taking his medications daily.  Past Medical History:  Diagnosis Date  . Cocaine use   . Diabetes mellitus without complication ([place])   . Enlarged heart   . History of degenerative disc disease   . Hyperlipidemia   . Hypertension   . NSTEMI (non-ST elevated myocardial infarction) ([place])   . Sciatic nerve pain     Past Surgical History:  Procedure Laterality Date  . CARDIAC CATHETERIZATION    . NO PAST SURGERIES      Family History  Problem Relation Age of Onset  . Heart attack Mother   . Hypertension Mother   . Diabetes Mother   . Heart disease Sister   . Hypertension Sister   .  Hypertension Father   . Emphysema Father     Social History   Socioeconomic History  . Marital status: Married    Spouse name: Not on file  . Number of children: Not on file  . Years of education: Not on file  . Highest education level: Not on file  Occupational History  . Not on file  Tobacco Use  . Smoking status: Current Every Day Smoker    Packs/day: 1.00    Years: 40.00    Pack years: 40.00    Types: Cigarettes  . Smokeless tobacco: Never Used  Substance and Sexual Activity  . Alcohol use: Yes    Comment: occasionally  . Drug use: Never  . Sexual activity: Yes  Other Topics Concern  . Not on file  Social History Narrative  . Not on file   Social Determinants of Health   Financial Resource Strain:   . Difficulty of Paying Living Expenses: Not on file  Food Insecurity:   . Worried About Charity fundraiser in the Last Year: Not on file  . Ran Out of Food in the Last Year: Not on file  Transportation Needs:   . Lack of Transportation (Medical): Not on file  . Lack of  Transportation (Non-Medical): Not on file  Physical Activity:   . Days of Exercise per Week: Not on file  . Minutes of Exercise per Session: Not on file  Stress:   . Feeling of Stress : Not on file  Social Connections:   . Frequency of Communication with Friends and Family: Not on file  . Frequency of Social Gatherings with Friends and Family: Not on file  . Attends Religious Services: Not on file  . Active Member of Clubs or Organizations: Not on file  . Attends Archivist Meetings: Not on file  . Marital Status: Not on file  Intimate Partner Violence:   . Fear of Current or Ex-Partner: Not on file  . Emotionally Abused: Not on file  . Physically Abused: Not on file  . Sexually Abused: Not on file    Outpatient Medications Prior to Visit  Medication Sig Dispense Refill  . albuterol (VENTOLIN HFA) 108 (90 Base) MCG/ACT inhaler Inhale 2 puffs into the lungs every 6 (six) hours as  needed for wheezing or shortness of breath (or cough). ONLY AS NEEDED. Not meant for every day use 18 g 1  . ALPRAZolam (XANAX) 0.25 MG tablet Take by mouth.    Marland Kitchen amLODipine (NORVASC) 10 MG tablet Take 1 tablet (10 mg total) by mouth daily. 90 tablet 3  . atorvastatin (LIPITOR) 40 MG tablet Take 1 tablet (40 mg total) by mouth daily. 90 tablet 1  . Blood Glucose Monitoring Suppl (TRUE METRIX METER) DEVI Use to check blood sugar TID. Dx: E11.69 1 each 0  . budesonide-formoterol (SYMBICORT) 160-4.5 MCG/ACT inhaler Inhale 2 puffs into the lungs 2 (two) times daily. 1 Inhaler 3  . carvedilol (COREG) 25 MG tablet Take 1 tablet (25 mg total) by mouth 2 (two) times daily with a meal. 60 tablet 3  . doxazosin (CARDURA) 4 MG tablet Take 1 tablet (4 mg total) by mouth at bedtime. 90 tablet 1  . hydrALAZINE (APRESOLINE) 100 MG tablet HOLD for 24 hours then resume 100mg  three times daily 90 tablet 1  . insulin aspart protamine- aspart (NOVOLOG MIX 70/30) (70-30) 100 UNIT/ML injection Inject into the skin.    . Insulin Pen Needle 31G X 6 MM MISC 1 Device by Does not apply route 2 (two) times daily. 100 each 3  . liraglutide (VICTOZA) 18 MG/3ML SOPN NEEDS PASS  SubQ:  Inject 0.6 mg once daily into the skin. Week 2: increase to 1.2 mg once daily; week 3: increase to 1.8 mg once daily 3 pen 6  . Misc. Devices MISC Please provide patient with insurance approved CPAP with the following settings:  autopap 15-20.  Large size Fisher[employer]kel Full Face Mask Forma mask and heated humidification. 1 each 0  . nicotine (NICODERM CQ - DOSED IN MG/24 HOURS) 21 mg/24hr patch Place 1 patch (21 mg total) onto the skin daily. 28 patch 0  . nitroGLYCERIN (NITROSTAT) 0.4 MG SL tablet Place 1 tablet (0.4 mg total) under the tongue every 5 (five) minutes as needed for chest pain. 50 tablet 3  . predniSONE (DELTASONE) 20 MG tablet Take 2 tablets daily for 5 days then stop 10 tablet 0  . spironolactone (ALDACTONE) 50 MG tablet Take 1  tablet (50 mg total) by mouth daily. 30 tablet 1  . TRUEplus Lancets 28G MISC Use to check blood sugar TID. Dx: E11.69 100 each 3  . Insulin Glargine (BASAGLAR KWIKPEN) 100 UNIT/ML Inject 0.4 mLs (40 Units total) into the skin daily. NEEDS  DOH 12 mL 3   No facility-administered medications prior to visit.    Allergies  Allergen Reactions  . Aspirin Hives  . Nitroglycerin Other (See Comments)    Nitroglycerin paste will make blood pressure go up and cause headaches. The nitroglycerin tablets do fine.    ROS Review of Systems  Constitutional: Positive for fatigue. Negative for chills and fever.  HENT: Positive for ear pain. Negative for ear discharge, facial swelling, nosebleeds, postnasal drip, rhinorrhea, sinus pressure, sinus pain and sore throat.   Respiratory: Negative for cough and shortness of breath.   Cardiovascular: Negative for chest pain and palpitations.  Gastrointestinal: Negative for abdominal pain, constipation, diarrhea and nausea.  Endocrine: Negative for polydipsia, polyphagia and polyuria.  Genitourinary: Negative for dysuria and frequency.  Neurological: Negative for dizziness and headaches.  Hematological: Negative for adenopathy. Does not bruise/bleed easily.      Objective:    Physical Exam Constitutional:      General: He is not in acute distress.    Appearance: Normal appearance. He is obese.  HENT:     Right Ear: Ear canal and external ear normal.     Left Ear: Ear canal and external ear normal.     Ears:     Comments: Right TM is dark pink and thickened, no visible landmarks; left TM dull    Nose: Nose normal. No congestion or rhinorrhea.  Neck:     Comments: Right superior, posterior cervical chain tenderness and mild lymphadenopathy just below the right ear Cardiovascular:     Rate and Rhythm: Normal rate and regular rhythm.  Pulmonary:     Effort: Pulmonary effort is normal.     Breath sounds: Normal breath sounds. No wheezing or rhonchi.    Abdominal:     Palpations: Abdomen is soft.     Tenderness: There is no abdominal tenderness. There is no guarding or rebound.  Musculoskeletal:     Cervical back: Normal range of motion. Tenderness present.  Lymphadenopathy:     Cervical: Cervical adenopathy present.  Neurological:     Mental Status: He is alert.     BP (!) 170/92   Pulse 78   Ht 5\' 11"  (1.803 m)   Wt 249 lb (112.9 kg)   SpO2 100%   BMI 34.73 kg/m  Wt Readings from Last 3 Encounters:  01/20/20 249 lb (112.9 kg)  01/12/20 248 lb 12.8 oz (112.9 kg)  12/26/19 248 lb (112.5 kg)     Health Maintenance Due  Topic Date Due  . Hepatitis C Screening  Never done  . PNEUMOCOCCAL POLYSACCHARIDE VACCINE AGE 72-64 HIGH RISK  Never done  . FOOT EXAM  Never done  . OPHTHALMOLOGY EXAM  Never done  . COVID-19 Vaccine (1) Never done  . TETANUS/TDAP  Never done  . INFLUENZA VACCINE  Never done     No results found for: TSH Lab Results  Component Value Date   WBC 6.7 11/28/2019   HGB 14.5 11/28/2019   HCT 44.5 11/28/2019   MCV 84 11/28/2019   PLT 259 11/28/2019   Lab Results  Component Value Date   NA 137 11/28/2019   K 3.9 11/28/2019   CO2 22 11/28/2019   GLUCOSE 239 (H) 11/28/2019   BUN 32 (H) 11/28/2019   CREATININE 2.64 (H) 11/28/2019   BILITOT <0.2 11/28/2019   ALKPHOS 114 11/28/2019   AST 18 11/28/2019   ALT 19 11/28/2019   PROT 6.1 11/28/2019   ALBUMIN 3.7 (L) 11/28/2019  CALCIUM 8.5 (L) 11/28/2019   ANIONGAP 13 06/29/2018   Lab Results  Component Value Date   CHOL 244 (H) 10/07/2019   Lab Results  Component Value Date   HDL 52 10/07/2019   Lab Results  Component Value Date   LDLCALC 163 (H) 10/07/2019   Lab Results  Component Value Date   TRIG 157 (H) 10/07/2019   Lab Results  Component Value Date   CHOLHDL 4.7 10/07/2019   Lab Results  Component Value Date   HGBA1C 8.3 (A) 10/07/2019      Assessment & Plan:  1. Right non-suppurative otitis media; 3. Right ear  pain Patient with right otitis media.  Prescription sent to patient's pharmacy for Augmentin 500 mg twice daily x7 days.  Prescription provided for tramadol 50 mg 1 every 8 hours as needed for ear pain #15.  If he develops onset of nasal congestion, he may take over-the-counter Coricidin but should avoid use of decongestants secondary to his hypertension, discussed with patient that decongestants can raise the blood pressure.  Return to clinic next week if ear pain worsens or does not improve. - amoxicillin-clavulanate (AUGMENTIN) 500-125 MG tablet; Take 1 tablet (500 mg total) by mouth 2 (two) times daily for 7 days. Take after eating  Dispense: 14 tablet; Refill: 0 - traMADol (ULTRAM) 50 MG tablet; Take 1 tablet (50 mg total) by mouth every 8 (eight) hours as needed for up to 5 days.  Dispense: 15 tablet; Refill: 0  2. Essential hypertension Blood pressure elevated at today's visit but this may be related to his current ear pain.  He has been asked to avoid the use of decongestants as these can raise his blood pressure.  On review of his cardiology visit from 01/12/2020 with Dr. Oval Linsey, blood pressure was much better at 138/92.  He is to continue to remain compliant with his blood pressure medications, monitor his blood pressure and schedule follow-up visit with his PCP.    Follow-up: Return for ear pain- next week if not better; schedule f/u with PCP regarding blood pressure.   Antony Blackbird, MD

## 2020-01-20 NOTE — Patient Instructions (Signed)
Otitis Media, Adult  Otitis media means that the middle ear is red and swollen (inflamed) and full of fluid. The condition usually goes away on its own. Follow these instructions at home:  Take over-the-counter and prescription medicines only as told by your doctor.  If you were prescribed an antibiotic medicine, take it as told by your doctor. Do not stop taking the antibiotic even if you start to feel better.  Keep all follow-up visits as told by your doctor. This is important. Contact a doctor if:  You have bleeding from your nose.  There is a lump on your neck.  You are not getting better in 5 days.  You feel worse instead of better. Get help right away if:  You have pain that is not helped with medicine.  You have swelling, redness, or pain around your ear.  You get a stiff neck.  You cannot move part of your face (paralyzed).  You notice that the bone behind your ear hurts when you touch it.  You get a very bad headache. Summary  Otitis media means that the middle ear is red, swollen, and full of fluid.  This condition usually goes away on its own. In some cases, treatment may be needed.  If you were prescribed an antibiotic medicine, take it as told by your doctor. This information is not intended to replace advice given to you by your health care provider. Make sure you discuss any questions you have with your health care provider. Document Revised: 04/03/2017 Document Reviewed: 05/12/2016 Elsevier Patient Education  2020 [place] Your Hypertension Hypertension is commonly called high blood pressure. This is when the force of your blood pressing against the walls of your arteries is too strong. Arteries are blood vessels that carry blood from your heart throughout your body. Hypertension forces the heart to work harder to pump blood, and may cause the arteries to become narrow or stiff. Having untreated or uncontrolled hypertension can cause heart  attack, stroke, kidney disease, and other problems. What are blood pressure readings? A blood pressure reading consists of a higher number over a lower number. Ideally, your blood pressure should be below 120/80. The first ("top") number is called the systolic pressure. It is a measure of the pressure in your arteries as your heart beats. The second ("bottom") number is called the diastolic pressure. It is a measure of the pressure in your arteries as the heart relaxes. What does my blood pressure reading mean? Blood pressure is classified into four stages. Based on your blood pressure reading, your health care provider may use the following stages to determine what type of treatment you need, if any. Systolic pressure and diastolic pressure are measured in a unit called mm Hg. Normal  Systolic pressure: below 161.  Diastolic pressure: below 80. Elevated  Systolic pressure: 096-045.  Diastolic pressure: below 80. Hypertension stage 1  Systolic pressure: 409-811.  Diastolic pressure: 91-47. Hypertension stage 2  Systolic pressure: 829 or above.  Diastolic pressure: 90 or above. What health risks are associated with hypertension? Managing your hypertension is an important responsibility. Uncontrolled hypertension can lead to:  A heart attack.  A stroke.  A weakened blood vessel (aneurysm).  Heart failure.  Kidney damage.  Eye damage.  Metabolic syndrome.  Memory and concentration problems. What changes can I make to manage my hypertension? Hypertension can be managed by making lifestyle changes and possibly by taking medicines. Your health care provider will help you make a  plan to bring your blood pressure within a normal range. Eating and drinking   Eat a diet that is high in fiber and potassium, and low in salt (sodium), added sugar, and fat. An example eating plan is called the DASH (Dietary Approaches to Stop Hypertension) diet. To eat this way: ? Eat plenty of  fresh fruits and vegetables. Try to fill half of your plate at each meal with fruits and vegetables. ? Eat whole grains, such as whole wheat pasta, brown rice, or whole grain bread. Fill about one quarter of your plate with whole grains. ? Eat low-fat diary products. ? Avoid fatty cuts of meat, processed or cured meats, and poultry with skin. Fill about one quarter of your plate with lean proteins such as fish, chicken without skin, beans, eggs, and tofu. ? Avoid premade and processed foods. These tend to be higher in sodium, added sugar, and fat.  Reduce your daily sodium intake. Most people with hypertension should eat less than 1,500 mg of sodium a day.  Limit alcohol intake to no more than 1 drink a day for nonpregnant women and 2 drinks a day for men. One drink equals 12 oz of beer, 5 oz of wine, or 1 oz of hard liquor. Lifestyle  Work with your health care provider to maintain a healthy body weight, or to lose weight. Ask what an ideal weight is for you.  Get at least 30 minutes of exercise that causes your heart to beat faster (aerobic exercise) most days of the week. Activities may include walking, swimming, or biking.  Include exercise to strengthen your muscles (resistance exercise), such as weight lifting, as part of your weekly exercise routine. Try to do these types of exercises for 30 minutes at least 3 days a week.  Do not use any products that contain nicotine or tobacco, such as cigarettes and e-cigarettes. If you need help quitting, ask your health care provider.  Control any long-term (chronic) conditions you have, such as high cholesterol or diabetes. Monitoring  Monitor your blood pressure at home as told by your health care provider. Your personal target blood pressure may vary depending on your medical conditions, your age, and other factors.  Have your blood pressure checked regularly, as often as told by your health care provider. Working with your health care  provider  Review all the medicines you take with your health care provider because there may be side effects or interactions.  Talk with your health care provider about your diet, exercise habits, and other lifestyle factors that may be contributing to hypertension.  Visit your health care provider regularly. Your health care provider can help you create and adjust your plan for managing hypertension. Will I need medicine to control my blood pressure? Your health care provider may prescribe medicine if lifestyle changes are not enough to get your blood pressure under control, and if:  Your systolic blood pressure is 130 or higher.  Your diastolic blood pressure is 80 or higher. Take medicines only as told by your health care provider. Follow the directions carefully. Blood pressure medicines must be taken as prescribed. The medicine does not work as well when you skip doses. Skipping doses also puts you at risk for problems. Contact a health care provider if:  You think you are having a reaction to medicines you have taken.  You have repeated (recurrent) headaches.  You feel dizzy.  You have swelling in your ankles.  You have trouble with your vision. Get  help right away if:  You develop a severe headache or confusion.  You have unusual weakness or numbness, or you feel faint.  You have severe pain in your chest or abdomen.  You vomit repeatedly.  You have trouble breathing. Summary  Hypertension is when the force of blood pumping through your arteries is too strong. If this condition is not controlled, it may put you at risk for serious complications.  Your personal target blood pressure may vary depending on your medical conditions, your age, and other factors. For most people, a normal blood pressure is less than 120/80.  Hypertension is managed by lifestyle changes, medicines, or both. Lifestyle changes include weight loss, eating a healthy, low-sodium diet, exercising  more, and limiting alcohol. This information is not intended to replace advice given to you by your health care provider. Make sure you discuss any questions you have with your health care provider. Document Revised: 08/13/2018 Document Reviewed: 03/19/2016 Elsevier Patient Education  [place].

## 2020-01-24 ENCOUNTER — Ambulatory Visit: Payer: Self-pay | Attending: Nurse Practitioner | Admitting: Nurse Practitioner

## 2020-01-24 ENCOUNTER — Encounter: Payer: Self-pay | Admitting: Nurse Practitioner

## 2020-01-24 DIAGNOSIS — H6591 Unspecified nonsuppurative otitis media, right ear: Secondary | ICD-10-CM

## 2020-01-24 DIAGNOSIS — F419 Anxiety disorder, unspecified: Secondary | ICD-10-CM

## 2020-01-24 MED ORDER — HYDROXYZINE HCL 25 MG PO TABS: 25.0000 mg | ORAL_TABLET | Freq: Three times a day (TID) | ORAL | 0 refills | Status: DC | PRN

## 2020-01-24 MED FILL — hydrOXYzine HCL 25 MG TABS: 25 | 20 days supply | Qty: 60 | Fill #0

## 2020-01-24 NOTE — Progress Notes (Signed)
Virtual Visit via Telephone Note Due to national recommendations of social distancing due to Matthew Odom, telehealth visit is felt to be most appropriate for this patient at this time.  I discussed the limitations, risks, security and privacy concerns of performing an evaluation and management service by telephone and the availability of in person appointments. I also discussed with the patient that there may be a patient responsible charge related to this service. The patient expressed understanding and agreed to proceed.    I connected with Matthew Odom on 01/24/20  at   3:50 PM EDT  EDT by telephone and verified that I am speaking with the correct person using two identifiers.   Consent I discussed the limitations, risks, security and privacy concerns of performing an evaluation and management service by telephone and the availability of in person appointments. I also discussed with the patient that there may be a patient responsible charge related to this service. The patient expressed understanding and agreed to proceed.   Location of Patient: Private Residence    Location of Provider: [place] [facility] [employer] Office    Persons participating in Telemedicine visit: Matthew Rankins FNP-BC Matthew Odom    History of Present Illness: Telemedicine visit for: F/U to right ear infection  Today states ear pain has significantly improved and almost completely resolved after taking augmentin for right non suppurative otitis media.   Anxiety Takes xanax 0.25 mg for anxiety. He is aware that I do not prescribe benzodiazepines. Will try hydroxyzine 25 mg TID prn for anxiety.    Past Medical History:  Diagnosis Date   Cocaine use    Diabetes mellitus without complication (HCC)    Enlarged heart    History of degenerative disc disease    Hyperlipidemia    Hypertension    NSTEMI (non-ST elevated myocardial infarction) ([place])    Sciatic nerve pain     Past  Surgical History:  Procedure Laterality Date   CARDIAC CATHETERIZATION     NO PAST SURGERIES      Family History  Problem Relation Age of Onset   Heart attack Mother    Hypertension Mother    Diabetes Mother    Heart disease Sister    Hypertension Sister    Hypertension Father    Emphysema Father     Social History   Socioeconomic History   Marital status: Married    Spouse name: Not on file   Number of children: Not on file   Years of education: Not on file   Highest education level: Not on file  Occupational History   Not on file  Tobacco Use   Smoking status: Current Every Day Smoker    Packs/day: 1.00    Years: 40.00    Pack years: 40.00    Types: Cigarettes   Smokeless tobacco: Never Used  Substance and Sexual Activity   Alcohol use: Yes    Comment: occasionally   Drug use: Never   Sexual activity: Yes  Other Topics Concern   Not on file  Social History Narrative   Not on file   Social Determinants of Health   Financial Resource Strain:    Difficulty of Paying Living Expenses: Not on file  Food Insecurity:    Worried About Running Out of Food in the Last Year: Not on file   [employer] of Food in the Last Year: Not on file  Transportation Needs:    Lack of Transportation (Medical): Not on  file   Lack of Transportation (Non-Medical): Not on file  Physical Activity:    Days of Exercise per Week: Not on file   Minutes of Exercise per Session: Not on file  Stress:    Feeling of Stress : Not on file  Social Connections:    Frequency of Communication with Friends and Family: Not on file   Frequency of Social Gatherings with Friends and Family: Not on file   Attends Religious Services: Not on file   Active Member of Clubs or Organizations: Not on file   Attends Archivist Meetings: Not on file   Marital Status: Not on file     Observations/Objective: Awake, alert and oriented x 3   Review of Systems   Constitutional: Negative for fever, malaise/fatigue and weight loss.  HENT: Negative.  Negative for nosebleeds.   Eyes: Negative.  Negative for blurred vision, double vision and photophobia.  Respiratory: Negative.  Negative for cough and shortness of breath.   Cardiovascular: Negative.  Negative for chest pain, palpitations and leg swelling.  Gastrointestinal: Negative.  Negative for heartburn, nausea and vomiting.  Musculoskeletal: Negative.  Negative for myalgias.  Neurological: Negative.  Negative for dizziness, focal weakness, seizures and headaches.  Psychiatric/Behavioral: Negative for suicidal ideas. The patient is nervous/anxious.     Assessment and Plan: Matthew Odom was seen today for follow-up.  Diagnoses and all orders for this visit:  Right non-suppurative otitis media Resolved   Anxiety -     hydrOXYzine (ATARAX/VISTARIL) 25 MG tablet; Take 1 tablet (25 mg total) by mouth 3 (three) times daily as needed.     Follow Up Instructions Return in about 3 months (around 04/24/2020).     I discussed the assessment and treatment plan with the patient. The patient was provided an opportunity to ask questions and all were answered. The patient agreed with the plan and demonstrated an understanding of the instructions.   The patient was advised to call back or seek an in-person evaluation if the symptoms worsen or if the condition fails to improve as anticipated.  I provided 16 minutes of non-face-to-face time during this encounter including median intraservice time, reviewing previous notes, labs, imaging, medications and explaining diagnosis and management.  Matthew Pounds, FNP-BC

## 2020-01-25 ENCOUNTER — Other Ambulatory Visit: Payer: Self-pay

## 2020-01-25 ENCOUNTER — Ambulatory Visit (HOSPITAL_COMMUNITY)
Admission: RE | Admit: 2020-01-25 | Discharge: 2020-01-25 | Disposition: A | Discharge status: Home / Self Care | Payer: Self-pay | Source: Ambulatory Visit | Attending: Cardiovascular Disease | Admitting: Cardiovascular Disease

## 2020-01-25 DIAGNOSIS — I10 Essential (primary) hypertension: Secondary | ICD-10-CM | POA: Insufficient documentation

## 2020-01-27 MED FILL — $VICTOZA 2-PAK 18MG/3ML PEN: 18 | 90 days supply | Qty: 27 | Fill #1

## 2020-01-30 ENCOUNTER — Encounter: Payer: Self-pay | Admitting: Nurse Practitioner

## 2020-01-31 ENCOUNTER — Ambulatory Visit: Payer: Self-pay | Attending: Nurse Practitioner

## 2020-01-31 ENCOUNTER — Other Ambulatory Visit: Payer: Self-pay

## 2020-01-31 ENCOUNTER — Other Ambulatory Visit: Payer: Self-pay | Admitting: Nurse Practitioner

## 2020-01-31 DIAGNOSIS — E1165 Type 2 diabetes mellitus with hyperglycemia: Secondary | ICD-10-CM

## 2020-01-31 DIAGNOSIS — N183 Chronic kidney disease, stage 3 unspecified: Secondary | ICD-10-CM

## 2020-01-31 DIAGNOSIS — Z1159 Encounter for screening for other viral diseases: Secondary | ICD-10-CM

## 2020-01-31 DIAGNOSIS — I10 Essential (primary) hypertension: Secondary | ICD-10-CM

## 2020-01-31 DIAGNOSIS — Z1211 Encounter for screening for malignant neoplasm of colon: Secondary | ICD-10-CM

## 2020-01-31 DIAGNOSIS — E1122 Type 2 diabetes mellitus with diabetic chronic kidney disease: Secondary | ICD-10-CM

## 2020-02-01 LAB — CMP14+EGFR
ALT: 22 IU/L (ref 0–44)
AST: 13 IU/L (ref 0–40)
Albumin/Globulin Ratio: 1.7 (ref 1.2–2.2)
Albumin: 4.2 g/dL (ref 4.0–5.0)
Alkaline Phosphatase: 94 IU/L (ref 44–121)
BUN/Creatinine Ratio: 11 (ref 9–20)
BUN: 30 mg/dL — ABNORMAL HIGH (ref 6–24)
Bilirubin Total: 0.3 mg/dL (ref 0.0–1.2)
CO2: 22 mmol/L (ref 20–29)
Calcium: 9.2 mg/dL (ref 8.7–10.2)
Chloride: 104 mmol/L (ref 96–106)
Creatinine, Ser: 2.65 mg/dL — ABNORMAL HIGH (ref 0.76–1.27)
GFR calc Af Amer: 31 mL/min/{1.73_m2} — ABNORMAL LOW (ref >59)
GFR calc non Af Amer: 27 mL/min/{1.73_m2} — ABNORMAL LOW (ref >59)
Globulin, Total: 2.5 g/dL (ref 1.5–4.5)
Glucose: 115 mg/dL — ABNORMAL HIGH (ref 65–99)
Potassium: 4.9 mmol/L (ref 3.5–5.2)
Sodium: 139 mmol/L (ref 134–144)
Total Protein: 6.7 g/dL (ref 6.0–8.5)

## 2020-02-01 LAB — HEMOGLOBIN A1C
Est. average glucose Bld gHb Est-mCnc: 160 mg/dL
Hgb A1c MFr Bld: 7.2 % — ABNORMAL HIGH (ref 4.8–5.6)

## 2020-02-01 LAB — TSH: TSH: 2.14 u[IU]/mL (ref 0.450–4.500)

## 2020-02-01 LAB — HEPATITIS C ANTIBODY: Hep C Virus Ab: <0.1 s/co ratio (ref 0.0–0.9)

## 2020-02-05 ENCOUNTER — Other Ambulatory Visit: Payer: Self-pay | Admitting: Nurse Practitioner

## 2020-02-05 DIAGNOSIS — N1832 Chronic kidney disease, stage 3b: Secondary | ICD-10-CM

## 2020-02-14 ENCOUNTER — Ambulatory Visit (INDEPENDENT_AMBULATORY_CARE_PROVIDER_SITE_OTHER): Payer: Self-pay | Admitting: Pharmacist Clinician (PhC)/ Clinical Pharmacy Specialist

## 2020-02-14 ENCOUNTER — Other Ambulatory Visit: Payer: Self-pay

## 2020-02-14 DIAGNOSIS — I10 Essential (primary) hypertension: Secondary | ICD-10-CM

## 2020-02-14 NOTE — Patient Instructions (Signed)
Return for a a follow up appointment Tuesday Nov 9 at 8 am  Check your blood pressure at home daily and keep record of the readings.  Take your BP meds as follows:  Increase doxazosin to 8 mg each night  Continue with all other medications  Call with any concerns or problems - Deiona Hooper/Raquel at [phone]  Bring all of your meds, your BP cuff and your record of home blood pressures to your next appointment.  Exercise as you're able, try to walk approximately 30 minutes per day.  Keep salt intake to a minimum, especially watch canned and prepared boxed foods.  Eat more fresh fruits and vegetables and fewer canned items.  Avoid eating in fast food restaurants.    HOW TO TAKE YOUR BLOOD PRESSURE: . Rest 5 minutes before taking your blood pressure. .  Don't smoke or drink caffeinated beverages for at least 30 minutes before. . Take your blood pressure before (not after) you eat. . Sit comfortably with your back supported and both feet on the floor (don't cross your legs). . Elevate your arm to heart level on a table or a desk. . Use the proper sized cuff. It should fit smoothly and snugly around your bare upper arm. There should be enough room to slip a fingertip under the cuff. The bottom edge of the cuff should be 1 inch above the crease of the elbow. . Ideally, take 3 measurements at one sitting and record the average.

## 2020-02-14 NOTE — Progress Notes (Signed)
02/14/2020 Matthew Odom [birth year] [id number]   HPI:  Matthew Odom is a 49 y.o. male patient of Dr Oval Linsey, with a PMH below who presents today for advanced hypertension clinic follow up.  He was seen by Dr. Oval Linsey on Sept 9, with a blood pressure of 138/92.  At that time he was taking both amlodipine and nifedipine, and was noted to have lower extremity edema.  The nifedipine was discontinued and doxazosin was started.    Today he returns for follow up.  He notes that the lower extremity edema is no longer present since he discontinued nifedipine.  He states home BP readings better overall, now mostly 093-267 systolic, although no readings with him today.  He admits to missing all meds yesterday, as daughter was in a car accident and he was rather distracted.  States that other than that, he has not missed medications in the past month.    Past Medical History: CHF Chronic systolic LVEF 12-45% - on carvedilol, spironolactone (no ARNI d/t kidney function)  CKD - 3b GFR 31, SCr 2.65  DM2 9/21 A1c 7.2 -on Victoza, Basaglar, Novolog 70/30,   hyperlipidemia 6/21:  TC 244, TG 157, HDL 59, LDL 163 - on atorvastatin 40 mg     Blood Pressure Goal:  130/80  Current Medications: amlodipine 10 mg qd am, carvedilol 25 mg bid, hydralazine 100 mg tid, doxazosin 4 mg hs, and spironolactone 50 mg qd am.  Family Hx:  Father died at 58 from emphysema, mother had MI 19 died 2 years later from complications, sister with enlarged heart, died at 73; brother died early before health issues could arise; children 16, 38, 27, 79, 62, 3  Social Hx: smoker, about 1/2 ppd, hasn't started nicotine patches at this time; occasional alcohol; occasional coffee, no soda or sweet tea  Diet: mostly home cooked, no salt diet; lots of chicken, salmon, occasional beef; eats veggeis, but doesn't like them much - fresh - cabbage, broccoli, caulflour, turnip greens  Exercise: no regular exercise  Home BP readings: home  cuff about 49 year old, arm cuff, no readings or cuff today - thinks 180/84 - states better, as previously would go to 200, nothing lower than 150  Intolerances: aspirin caused hives, nitro paste caused headaches, BP fluctuation  Labs: 01/31/20:  Na 139, K 4.9, Glu 115, BUN 30, SCr 2.65, GFR 31   Wt Readings from Last 3 Encounters:  02/14/20 242 lb 6.4 oz (110 kg)  01/20/20 249 lb (112.9 kg)  01/12/20 248 lb 12.8 oz (112.9 kg)   BP Readings from Last 3 Encounters:  02/14/20 (!) 182/112  01/20/20 (!) 170/92  01/12/20 (!) 138/92   Pulse Readings from Last 3 Encounters:  02/14/20 99  01/20/20 78  01/12/20 93    Current Outpatient Medications  Medication Sig Dispense Refill  . albuterol (VENTOLIN HFA) 108 (90 Base) MCG/ACT inhaler Inhale 2 puffs into the lungs every 6 (six) hours as needed for wheezing or shortness of breath (or cough). ONLY AS NEEDED. Not meant for every day use 18 g 1  . ALPRAZolam (XANAX) 0.25 MG tablet Take by mouth.    Marland Kitchen amLODipine (NORVASC) 10 MG tablet Take 1 tablet (10 mg total) by mouth daily. 90 tablet 3  . atorvastatin (LIPITOR) 40 MG tablet Take 1 tablet (40 mg total) by mouth daily. 90 tablet 1  . Blood Glucose Monitoring Suppl (TRUE METRIX METER) DEVI Use to check blood sugar TID. Dx: E11.69 1  each 0  . budesonide-formoterol (SYMBICORT) 160-4.5 MCG/ACT inhaler Inhale 2 puffs into the lungs 2 (two) times daily. 1 Inhaler 3  . carvedilol (COREG) 25 MG tablet Take 1 tablet (25 mg total) by mouth 2 (two) times daily with a meal. 60 tablet 3  . doxazosin (CARDURA) 4 MG tablet Take 1 tablet (4 mg total) by mouth at bedtime. 90 tablet 1  . hydrALAZINE (APRESOLINE) 100 MG tablet HOLD for 24 hours then resume 100mg  three times daily 90 tablet 1  . hydrOXYzine (ATARAX/VISTARIL) 25 MG tablet Take 1 tablet (25 mg total) by mouth 3 (three) times daily as needed. 60 tablet 0  . insulin aspart protamine- aspart (NOVOLOG MIX 70/30) (70-30) 100 UNIT/ML injection Inject into  the skin.     . Insulin Glargine (BASAGLAR KWIKPEN) 100 UNIT/ML Inject 0.4 mLs (40 Units total) into the skin daily. NEEDS DOH 12 mL 3  . Insulin Pen Needle 31G X 6 MM MISC 1 Device by Does not apply route 2 (two) times daily. 100 each 3  . liraglutide (VICTOZA) 18 MG/3ML SOPN NEEDS PASS  SubQ:  Inject 0.6 mg once daily into the skin. Week 2: increase to 1.2 mg once daily; week 3: increase to 1.8 mg once daily 3 pen 6  . nitroGLYCERIN (NITROSTAT) 0.4 MG SL tablet Place 1 tablet (0.4 mg total) under the tongue every 5 (five) minutes as needed for chest pain. 50 tablet 3  . predniSONE (DELTASONE) 20 MG tablet Take 2 tablets daily for 5 days then stop 10 tablet 0  . spironolactone (ALDACTONE) 50 MG tablet Take 1 tablet (50 mg total) by mouth daily. 30 tablet 1  . TRUEplus Lancets 28G MISC Use to check blood sugar TID. Dx: E11.69 100 each 3  . Misc. Devices MISC Please provide patient with insurance approved CPAP with the following settings:  autopap 15-20.  Large size [employer] Full Face Mask Forma mask and heated humidification. (Patient not taking: Reported on 01/24/2020) 1 each 0  . nicotine (NICODERM CQ - DOSED IN MG/24 HOURS) 21 mg/24hr patch Place 1 patch (21 mg total) onto the skin daily. (Patient not taking: Reported on 01/24/2020) 28 patch 0   No current facility-administered medications for this visit.    Allergies  Allergen Reactions  . Aspirin Hives  . Nitroglycerin Other (See Comments)    Nitroglycerin paste will make blood pressure go up and cause headaches. The nitroglycerin tablets do fine.    Past Medical History:  Diagnosis Date  . Cocaine use   . Diabetes mellitus without complication ([place])   . Enlarged heart   . History of degenerative disc disease   . Hyperlipidemia   . Hypertension   . NSTEMI (non-ST elevated myocardial infarction) ([place])   . Sciatic nerve pain     Blood pressure (!) 182/112, pulse 99, resp. rate 15, height 5\' 11"  (1.803 m), weight 242 lb 6.4 oz  (110 kg), SpO2 98 %.  HTN (hypertension) Patient with strong family history of hypertension, currently not well controlled.  Will have him increase doxazosin to 8 mg each night and continue with all other medications.  He will also need to check home BP on daily basis and bring those readings, as well as his home cuff, to next appointment in 1 month.  He is interested in [employer] program, so will reach out to them to be sure he is on their wait list.  He was encouraged to exercise on his own until then.  Tommy Medal PharmD CPP [facility] [address] [place], [place] [address] [phone]

## 2020-02-14 NOTE — Assessment & Plan Note (Signed)
Patient with strong family history of hypertension, currently not well controlled.  Will have him increase doxazosin to 8 mg each night and continue with all other medications.  He will also need to check home BP on daily basis and bring those readings, as well as his home cuff, to next appointment in 1 month.  He is interested in [employer] program, so will reach out to them to be sure he is on their wait list.  He was encouraged to exercise on his own until then.

## 2020-02-16 MED FILL — ?ATORVASTATIN 40MG TABLET: 40 | 30 days supply | Qty: 30 | Fill #1

## 2020-02-16 MED FILL — ?CARVEDILOL 25 MG TABLET: 25 | 30 days supply | Qty: 60 | Fill #1

## 2020-02-16 MED FILL — ?SPIRONOLACTONE 50 MG TABLE: 50 | 30 days supply | Qty: 30 | Fill #1

## 2020-02-16 MED FILL — DOXAZOSIN MESYLATE 4 MG TAB: 4 | 30 days supply | Qty: 30 | Fill #1

## 2020-02-16 MED FILL — AMLODIPINE BESYLATE 10 MG T: 10 | 30 days supply | Qty: 30 | Fill #2

## 2020-02-16 MED FILL — $VICTOZA 2-PAK 18MG/3ML PEN: 18 | 90 days supply | Qty: 27 | Fill #1

## 2020-03-06 ENCOUNTER — Other Ambulatory Visit: Payer: Self-pay | Admitting: Internal Medicine

## 2020-03-06 DIAGNOSIS — N183 Chronic kidney disease, stage 3 unspecified: Secondary | ICD-10-CM

## 2020-03-12 NOTE — Progress Notes (Signed)
HPI:  Matthew Odom is a 49 y.o. male patient of Dr Oval Linsey, with a PMH below who presents today for advanced hypertension clinic follow up.  He was seen by Dr. Oval Linsey on Sept 9, with a blood pressure of 138/92.  At that time he was taking both amlodipine and nifedipine, and was noted to have lower extremity edema. The nifedipine was discontinued and doxazosin was started. During his last OV doxazosin dose was increased to 8mg  every evening.  Patient still waiting for CPAP machine as well.  Today he returns for follow up.  Reports headaches in front of head , denies dizziness, and swelling, no balance issues or ringing or the ears, no vision changes.    Past Medical History: CHF Chronic systolic LVEF 35-70% - on carvedilol, spironolactone (no ARNI d/t kidney function)  CKD - 3b GFR 31, SCr 2.65  DM2 9/21 A1c 7.2 -on Victoza, Basaglar, Novolog 70/30,   hyperlipidemia 6/21:  TC 244, TG 157, HDL 59, LDL 163 - on atorvastatin 40 mg     Blood Pressure Goal:  130/80  Current Medications:  amlodipine 10 mg daily 1pm   carvedilol 25 mg twice daily (8am)  hydralazine 100 mg three times daily (8am, 1-2pm, 8pm)  doxazosin 8mg  every evening (8pm)  spironolactone 50 mg every morning  Family Hx:  Father died at 22 from emphysema, mother had MI 51 died 2 years later from complications, sister with enlarged heart, died at 16; brother died early before health issues could arise; children 41, 62, 21, 79, 16, 3  Social Hx: smoker, about 1/2 ppd, hasn't started nicotine patches at this time; occasional alcohol; occasional coffee, no soda or sweet tea  Diet: mostly home cooked, no salt diet; lots of chicken, salmon, occasional beef; eats veggeis, but doesn't like them much - fresh - cabbage, broccoli, caulflour, turnip greens.   Exercise: no regular exercise  Home BP readings: home cuff about 49 year old, arm cuff, no readings or cuff today   Intolerances: aspirin caused hives, nitro paste caused  headaches, BP fluctuation  Labs: 01/31/20:  Na 139, K 4.9, Glu 115, BUN 30, SCr 2.65, GFR 31   Wt Readings from Last 3 Encounters:  03/13/20 248 lb (112.5 kg)  02/14/20 242 lb 6.4 oz (110 kg)  01/20/20 249 lb (112.9 kg)   BP Readings from Last 3 Encounters:  03/13/20 (!) 142/90  02/14/20 (!) 182/112  01/20/20 (!) 170/92   Pulse Readings from Last 3 Encounters:  03/13/20 93  02/14/20 99  01/20/20 78    Current Outpatient Medications  Medication Sig Dispense Refill  . albuterol (VENTOLIN HFA) 108 (90 Base) MCG/ACT inhaler Inhale 2 puffs into the lungs every 6 (six) hours as needed for wheezing or shortness of breath (or cough). ONLY AS NEEDED. Not meant for every day use 18 g 1  . ALPRAZolam (XANAX) 0.25 MG tablet Take by mouth.    Marland Kitchen atorvastatin (LIPITOR) 40 MG tablet Take 1 tablet (40 mg total) by mouth daily. 90 tablet 1  . budesonide-formoterol (SYMBICORT) 160-4.5 MCG/ACT inhaler Inhale 2 puffs into the lungs 2 (two) times daily. 1 Inhaler 3  . carvedilol (COREG) 25 MG tablet Take 1 tablet (25 mg total) by mouth 2 (two) times daily with a meal. 60 tablet 3  . doxazosin (CARDURA) 4 MG tablet Take 1 tablet (4 mg total) by mouth at bedtime. 90 tablet 1  . hydrALAZINE (APRESOLINE) 100 MG tablet HOLD for 24 hours then  resume 100mg  three times daily 90 tablet 1  . hydrOXYzine (ATARAX/VISTARIL) 25 MG tablet Take 1 tablet (25 mg total) by mouth 3 (three) times daily as needed. 60 tablet 0  . insulin aspart protamine- aspart (NOVOLOG MIX 70/30) (70-30) 100 UNIT/ML injection Inject into the skin.     . Insulin Glargine (BASAGLAR KWIKPEN) 100 UNIT/ML Inject 0.4 mLs (40 Units total) into the skin daily. NEEDS DOH 12 mL 3  . Insulin Pen Needle 31G X 6 MM MISC 1 Device by Does not apply route 2 (two) times daily. 100 each 3  . liraglutide (VICTOZA) 18 MG/3ML SOPN NEEDS PASS  SubQ:  Inject 0.6 mg once daily into the skin. Week 2: increase to 1.2 mg once daily; week 3: increase to 1.8 mg once daily  3 pen 6  . nitroGLYCERIN (NITROSTAT) 0.4 MG SL tablet Place 1 tablet (0.4 mg total) under the tongue every 5 (five) minutes as needed for chest pain. 50 tablet 3  . spironolactone (ALDACTONE) 50 MG tablet Take 1 tablet (50 mg total) by mouth daily. 30 tablet 1  . Misc. Devices MISC Please provide patient with insurance approved CPAP with the following settings:  autopap 15-20.  Large size [employer]kel Full Face Mask Forma mask and heated humidification. (Patient not taking: Reported on 03/13/2020) 1 each 0  . NIFEdipine (PROCARDIA XL/NIFEDICAL-XL) 90 MG 24 hr tablet Take 1 tablet (90 mg total) by mouth daily. 90 tablet 1   No current facility-administered medications for this visit.    Allergies  Allergen Reactions  . Aspirin Hives  . Nitroglycerin Other (See Comments)    Nitroglycerin paste will make blood pressure go up and cause headaches. The nitroglycerin tablets do fine.    Past Medical History:  Diagnosis Date  . Cocaine use   . Diabetes mellitus without complication ([place])   . Enlarged heart   . History of degenerative disc disease   . Hyperlipidemia   . Hypertension   . NSTEMI (non-ST elevated myocardial infarction) ([place])   . Sciatic nerve pain     Blood pressure (!) 142/90, pulse 93, resp. rate 15, height 5\' 9"  (1.753 m), weight 248 lb (112.5 kg), SpO2 96 %.  HTN (hypertension) BP remains above goal and but slightly improved from previous readings. Patient continues to have low extremity edema, but denies other issues with current therapy. He still waiting for CPAP machine and still not followed up with [facility] at [facility] program.    Will stop amlodipine and resume nifedipine XL 90mg  daily, continue all other medication, provide phone number to follow up with [facility] program, and follow up in 2 weeks.  Patient was instructed to limit sodium intake, and bring phone number for CPAP provider to assist of follow up. Plan to increase doxazosin to 8mg  BID during next OV if able to  tolerate.   Nadezhda Pollitt Rodriguez-Guzman PharmD, BCPS, [place] [id number] 03/21/2020 2:04 PM

## 2020-03-13 ENCOUNTER — Other Ambulatory Visit: Payer: Self-pay

## 2020-03-13 ENCOUNTER — Ambulatory Visit (INDEPENDENT_AMBULATORY_CARE_PROVIDER_SITE_OTHER): Payer: Self-pay | Admitting: Pharmacist

## 2020-03-13 VITALS — BP 142/90 | HR 93 | Resp 15 | Ht 69.0 in | Wt 248.0 lb

## 2020-03-13 DIAGNOSIS — I10 Essential (primary) hypertension: Secondary | ICD-10-CM

## 2020-03-13 MED ORDER — NIFEDIPINE ER OSMOTIC RELEASE 90 MG PO TB24
90.0000 mg | ORAL_TABLET | Freq: Every day | ORAL | 1 refills | Status: DC
Start: 2020-03-13 — End: 2020-04-24

## 2020-03-13 MED FILL — NIFEdipine ER OSMOTIC RELEA: 90 | 30 days supply | Qty: 30 | Fill #0

## 2020-03-13 NOTE — Patient Instructions (Addendum)
Return for a follow up appointment in 2 weeks  Check your blood pressure at home daily (if able) and keep record of the readings.  Take your BP meds as follows: *STOP taking amlodipine 10mg * *START nifedipine XL 90mg  daily*   *CONTACT PAM at [facility] program for exercises [phone]*  *Bring contact for sleep study to follow up with CPAP machine indication*  Bring all of your meds, your BP cuff and your record of home blood pressures to your next appointment.  Exercise as you're able, try to walk approximately 30 minutes per day.  Keep salt intake to a minimum, especially watch canned and prepared boxed foods.  Eat more fresh fruits and vegetables and fewer canned items.  Avoid eating in fast food restaurants.    HOW TO TAKE YOUR BLOOD PRESSURE: . Rest 5 minutes before taking your blood pressure. .  Don't smoke or drink caffeinated beverages for at least 30 minutes before. . Take your blood pressure before (not after) you eat. . Sit comfortably with your back supported and both feet on the floor (don't cross your legs). . Elevate your arm to heart level on a table or a desk. . Use the proper sized cuff. It should fit smoothly and snugly around your bare upper arm. There should be enough room to slip a fingertip under the cuff. The bottom edge of the cuff should be 1 inch above the crease of the elbow. . Ideally, take 3 measurements at one sitting and record the average.

## 2020-03-14 ENCOUNTER — Telehealth: Payer: Self-pay

## 2020-03-14 NOTE — Telephone Encounter (Signed)
Call placed to pt reference referral to [facility] (previously referred by HTN clinic) by pharmD LVMT requesting call back to discuss class and scheduling.

## 2020-03-14 NOTE — Telephone Encounter (Signed)
Pt stated that the meds he was changed to yesterday caused light sensitivity will route to raquel pharmd

## 2020-03-14 NOTE — Telephone Encounter (Signed)
Patient took nifedipine XL 90mg  yesterday evening and this morning. BP at 493 systolic , but experiencing sun sensitivity.  Recommendation:  1. Take nifedipine doses 24 hours apart and ONLY in the evening  2. Call back next week if sun sensitive not improved after spacing doses better and changing administration time.

## 2020-03-21 ENCOUNTER — Encounter: Payer: Self-pay | Admitting: Pharmacist

## 2020-03-21 NOTE — Assessment & Plan Note (Addendum)
BP remains above goal and but slightly improved from previous readings. Patient continues to have low extremity edema, but denies other issues with current therapy. He still waiting for CPAP machine and still not followed up with [facility] at [facility] program.    Will stop amlodipine and resume nifedipine XL 90mg  daily, continue all other medication, provide phone number to follow up with [facility] program, and follow up in 2 weeks.  Patient was instructed to limit sodium intake, and bring phone number for CPAP provider to assist of follow up. Plan to increase doxazosin to 8mg  BID during next OV if able to tolerate.

## 2020-03-23 ENCOUNTER — Other Ambulatory Visit: Payer: Self-pay

## 2020-03-27 ENCOUNTER — Ambulatory Visit: Payer: Self-pay

## 2020-03-27 NOTE — Progress Notes (Deleted)
HPI:  Matthew Odom is a 49 y.o. male patient of Dr Oval Linsey, with a PMH below who presents today for advanced hypertension clinic follow up.  Patient still waiting for CPAP machine as well.  Today he returns for follow up.  Reports headaches in front of head , denies dizziness, and swelling, no balance issues or ringing or the ears, no vision changes.    Past Medical History: CHF Chronic systolic LVEF 40-10% - on carvedilol, spironolactone (no ARNI d/t kidney function)  CKD - 3b GFR 31, SCr 2.65  DM2 9/21 A1c 7.2 -on Victoza, Basaglar, Novolog 70/30,   hyperlipidemia 6/21:  TC 244, TG 157, HDL 59, LDL 163 - on atorvastatin 40 mg     Blood Pressure Goal:  130/80  Current Medications: Nifedipine 90XL every evening??  carvedilol 25 mg twice daily (8am & 8pm)??  hydralazine 100 mg three times daily (8am, 1-2pm, 8pm)  doxazosin 4mg  every evening (8pm)  spironolactone 50 mg every morning  Family Hx:  Father died at 64 from emphysema, mother had MI 58 died 2 years later from complications, sister with enlarged heart, died at 2; brother died early before health issues could arise; children 50, 69, 19, 90, 40, 3  Social Hx: smoker, about 1/2 ppd, hasn't started nicotine patches at this time; occasional alcohol; occasional coffee, no soda or sweet tea  Diet: mostly home cooked, no salt diet; lots of chicken, salmon, occasional beef; eats veggeis, but doesn't like them much - fresh - cabbage, broccoli, caulflour, turnip greens.   Exercise: no regular exercise (PREP still pending - needs to call back and schedule 1st section)  Home BP readings: home cuff about 49 year old, arm cuff, no readings or cuff today   Intolerances: aspirin caused hives, nitro paste caused headaches, BP fluctuation  Labs: 01/31/20:  Na 139, K 4.9, Glu 115, BUN 30, SCr 2.65, GFR 31   Wt Readings from Last 3 Encounters:  03/13/20 248 lb (112.5 kg)  02/14/20 242 lb 6.4 oz (110 kg)  01/20/20 249 lb (112.9 kg)    BP Readings from Last 3 Encounters:  03/13/20 (!) 142/90  02/14/20 (!) 182/112  01/20/20 (!) 170/92   Pulse Readings from Last 3 Encounters:  03/13/20 93  02/14/20 99  01/20/20 78    Current Outpatient Medications  Medication Sig Dispense Refill  . albuterol (VENTOLIN HFA) 108 (90 Base) MCG/ACT inhaler Inhale 2 puffs into the lungs every 6 (six) hours as needed for wheezing or shortness of breath (or cough). ONLY AS NEEDED. Not meant for every day use 18 g 1  . ALPRAZolam (XANAX) 0.25 MG tablet Take by mouth.    Marland Kitchen atorvastatin (LIPITOR) 40 MG tablet Take 1 tablet (40 mg total) by mouth daily. 90 tablet 1  . budesonide-formoterol (SYMBICORT) 160-4.5 MCG/ACT inhaler Inhale 2 puffs into the lungs 2 (two) times daily. 1 Inhaler 3  . carvedilol (COREG) 25 MG tablet Take 1 tablet (25 mg total) by mouth 2 (two) times daily with a meal. 60 tablet 3  . doxazosin (CARDURA) 4 MG tablet Take 1 tablet (4 mg total) by mouth at bedtime. 90 tablet 1  . hydrALAZINE (APRESOLINE) 100 MG tablet HOLD for 24 hours then resume 100mg  three times daily 90 tablet 1  . hydrOXYzine (ATARAX/VISTARIL) 25 MG tablet Take 1 tablet (25 mg total) by mouth 3 (three) times daily as needed. 60 tablet 0  . insulin aspart protamine- aspart (NOVOLOG MIX 70/30) (70-30) 100 UNIT/ML  injection Inject into the skin.     . Insulin Glargine (BASAGLAR KWIKPEN) 100 UNIT/ML Inject 0.4 mLs (40 Units total) into the skin daily. NEEDS DOH 12 mL 3  . Insulin Pen Needle 31G X 6 MM MISC 1 Device by Does not apply route 2 (two) times daily. 100 each 3  . liraglutide (VICTOZA) 18 MG/3ML SOPN NEEDS PASS  SubQ:  Inject 0.6 mg once daily into the skin. Week 2: increase to 1.2 mg once daily; week 3: increase to 1.8 mg once daily 3 pen 6  . Misc. Devices MISC Please provide patient with insurance approved CPAP with the following settings:  autopap 15-20.  Large size Fisher[employer]kel Full Face Mask Forma mask and heated humidification. (Patient not  taking: Reported on 03/13/2020) 1 each 0  . NIFEdipine (PROCARDIA XL/NIFEDICAL-XL) 90 MG 24 hr tablet Take 1 tablet (90 mg total) by mouth daily. 90 tablet 1  . nitroGLYCERIN (NITROSTAT) 0.4 MG SL tablet Place 1 tablet (0.4 mg total) under the tongue every 5 (five) minutes as needed for chest pain. 50 tablet 3  . spironolactone (ALDACTONE) 50 MG tablet Take 1 tablet (50 mg total) by mouth daily. 30 tablet 1   No current facility-administered medications for this visit.    Allergies  Allergen Reactions  . Aspirin Hives  . Nitroglycerin Other (See Comments)    Nitroglycerin paste will make blood pressure go up and cause headaches. The nitroglycerin tablets do fine.    Past Medical History:  Diagnosis Date  . Cocaine use   . Diabetes mellitus without complication ([place])   . Enlarged heart   . History of degenerative disc disease   . Hyperlipidemia   . Hypertension   . NSTEMI (non-ST elevated myocardial infarction) ([place])   . Sciatic nerve pain     There were no vitals taken for this visit.  No problem-specific Assessment & Plan notes found for this encounter.  Zayon Trulson Rodriguez-Guzman PharmD, BCPS, [place] [address] [place] [address] 03/27/2020 8:16 AM

## 2020-03-27 NOTE — Patient Instructions (Incomplete)
Return for a  follow up appointment in 4 weeks  Go to the lab in [place] your blood pressure at home daily (if able) and keep record of the readings.  Take your BP meds as follows:  Bring all of your meds, your BP cuff and your record of home blood pressures to your next appointment.  Exercise as you're able, try to walk approximately 30 minutes per day.  Keep salt intake to a minimum, especially watch canned and prepared boxed foods.  Eat more fresh fruits and vegetables and fewer canned items.  Avoid eating in fast food restaurants.    HOW TO TAKE YOUR BLOOD PRESSURE: . Rest 5 minutes before taking your blood pressure. .  Don't smoke or drink caffeinated beverages for at least 30 minutes before. . Take your blood pressure before (not after) you eat. . Sit comfortably with your back supported and both feet on the floor (don't cross your legs). . Elevate your arm to heart level on a table or a desk. . Use the proper sized cuff. It should fit smoothly and snugly around your bare upper arm. There should be enough room to slip a fingertip under the cuff. The bottom edge of the cuff should be 1 inch above the crease of the elbow. . Ideally, take 3 measurements at one sitting and record the average.

## 2020-04-05 ENCOUNTER — Other Ambulatory Visit: Payer: Self-pay

## 2020-04-24 ENCOUNTER — Other Ambulatory Visit: Payer: Self-pay

## 2020-04-24 ENCOUNTER — Ambulatory Visit: Payer: Self-pay | Attending: Nurse Practitioner | Admitting: Nurse Practitioner

## 2020-04-24 ENCOUNTER — Encounter: Payer: Self-pay | Admitting: Nurse Practitioner

## 2020-04-24 ENCOUNTER — Other Ambulatory Visit: Payer: Self-pay | Admitting: Nurse Practitioner

## 2020-04-24 VITALS — BP 168/98 | HR 81 | Temp 97.9°F | Ht 71.0 in | Wt 255.0 lb

## 2020-04-24 DIAGNOSIS — E1169 Type 2 diabetes mellitus with other specified complication: Secondary | ICD-10-CM

## 2020-04-24 DIAGNOSIS — K089 Disorder of teeth and supporting structures, unspecified: Secondary | ICD-10-CM

## 2020-04-24 DIAGNOSIS — E1165 Type 2 diabetes mellitus with hyperglycemia: Secondary | ICD-10-CM

## 2020-04-24 DIAGNOSIS — I10 Essential (primary) hypertension: Secondary | ICD-10-CM

## 2020-04-24 DIAGNOSIS — E785 Hyperlipidemia, unspecified: Secondary | ICD-10-CM

## 2020-04-24 DIAGNOSIS — Z794 Long term (current) use of insulin: Secondary | ICD-10-CM

## 2020-04-24 LAB — POCT GLYCOSYLATED HEMOGLOBIN (HGB A1C): Hemoglobin A1C: 7 % — AB (ref 4.0–5.6)

## 2020-04-24 LAB — GLUCOSE, POCT (MANUAL RESULT ENTRY): POC Glucose: 200 mg/dl — AB (ref 70–99)

## 2020-04-24 MED ORDER — CHLORHEXIDINE GLUCONATE 0.12 % MT SOLN: 15.0000 mL | Freq: Two times a day (BID) | OROMUCOSAL | 0 refills | Status: DC

## 2020-04-24 MED ORDER — BASAGLAR KWIKPEN 100 UNIT/ML ~~LOC~~ SOPN: 20.0000 [IU] | PEN_INJECTOR | Freq: Every day | SUBCUTANEOUS | 3 refills | Status: DC

## 2020-04-24 MED ORDER — AMLODIPINE BESYLATE 10 MG PO TABS: 10.0000 mg | ORAL_TABLET | Freq: Every day | ORAL | 3 refills | Status: DC

## 2020-04-24 MED ORDER — VICTOZA 18 MG/3ML ~~LOC~~ SOPN: 1.8000 mg | PEN_INJECTOR | Freq: Every day | SUBCUTANEOUS | 6 refills | Status: DC

## 2020-04-24 MED FILL — AMLODIPINE BESYLATE 10 MG T: 10 | 30 days supply | Qty: 30 | Fill #0

## 2020-04-24 MED FILL — CHLORHEXIDINE 0.12% RINSE: 0.12 | 14 days supply | Qty: 473 | Fill #0

## 2020-04-24 NOTE — Progress Notes (Signed)
Assessment & Plan:  Matthew Odom was seen today for follow-up.  Diagnoses and all orders for this visit:  Type 2 diabetes mellitus with hyperglycemia, with long-term current use of insulin (HCC) -     Glucose (CBG) -     HgB A1c -     CMP14+EGFR -     Ambulatory referral to Podiatry Continue blood sugar control as discussed in office today, low carbohydrate diet, and regular physical exercise as tolerated, 150 minutes per week (30 min each day, 5 days per week, or 50 min 3 days per week). Keep blood sugar logs with fasting goal of 90-130 mg/dl, post prandial (after you eat) less than 180.  For Hypoglycemia: BS <60 and Hyperglycemia BS >400; contact the clinic ASAP. Annual eye exams and foot exams are recommended.   Dyslipidemia, goal LDL below 70 -     Lipid panel INSTRUCTIONS: Work on a low fat, heart healthy diet and participate in regular aerobic exercise program by working out at least 150 minutes per week; 5 days a week-30 minutes per day. Avoid red meat/beef/steak,  fried foods. junk foods, sodas, sugary drinks, unhealthy snacking, alcohol and smoking.  Drink at least 80 oz of water per day and monitor your carbohydrate intake daily.    Poor dentition -     Ambulatory referral to Dentistry -     chlorhexidine (PERIDEX) 0.12 % solution; Use as directed 15 mLs in the mouth or throat 2 (two) times daily.  Essential hypertension -     amLODipine (NORVASC) 10 MG tablet; Take 1 tablet (10 mg total) by mouth daily. Continue all antihypertensives as prescribed.  Remember to bring in your blood pressure log with you for your follow up appointment.  DASH/Mediterranean Diets are healthier choices for HTN.    Patient has been counseled on age-appropriate routine health concerns for screening and prevention. These are reviewed and up-to-date. Referrals have been placed accordingly. Immunizations are up-to-date or declined.    Subjective:   Chief Complaint  Patient presents with  .  Follow-up    Pt. Is here for 3 months follow up.    HPI Matthew Odom 49 y.o. male presents to office today for follow up. PMH: Cocaine use, DM2, DDD, Hyperlipidemia, Hypertension, NSTEMI, OSA, and Sciatic nerve pain.  Essential Hypertension Poorly controlled. He has OSA but unable to afford CPAP. Uninsured and unfortunately we do not offer a free CPAP program. He had an office visit with Cardiology in November and amlodipine was stopped and he was restarted on procardia XL. Today he states he stopped taking procardia as it was causing photosensitivity. I will restart him on amlodipine today and have him return for blood pressure check in a few weeks. He will continue on amlodipine 10 mg daily, carvedilol 25 mg BID, cardura 4 mg at bedtime, hydralazine 100 mg TID, and spironolactone 50 mg daily.  BP Readings from Last 3 Encounters:  04/24/20 (!) 168/98  03/13/20 (!) 142/90  02/14/20 (!) 182/112   DM 2 Diabetes is improving. He endorses medication victoza 1.8 mg daily, basaglar 40 units daily. LDL not at goal with atorvastatin 40 mg daily.  Lab Results  Component Value Date   HGBA1C 7.0 (A) 04/24/2020   Lab Results  Component Value Date   LDLCALC 163 (H) 10/07/2019    Review of Systems  Constitutional: Negative for fever, malaise/fatigue and weight loss.  HENT: Negative.  Negative for nosebleeds.        Dental pain  Eyes:  Negative.  Negative for blurred vision, double vision and photophobia.  Respiratory: Negative.  Negative for cough and shortness of breath.   Cardiovascular: Negative.  Negative for chest pain, palpitations and leg swelling.  Gastrointestinal: Negative.  Negative for heartburn, nausea and vomiting.  Musculoskeletal: Negative.  Negative for myalgias.  Neurological: Negative.  Negative for dizziness, focal weakness, seizures and headaches.  Psychiatric/Behavioral: Negative for suicidal ideas.    Past Medical History:  Diagnosis Date  . Cocaine use   . Diabetes  mellitus without complication ([place])   . Enlarged heart   . History of degenerative disc disease   . Hyperlipidemia   . Hypertension   . NSTEMI (non-ST elevated myocardial infarction) ([place])   . Sciatic nerve pain     Past Surgical History:  Procedure Laterality Date  . CARDIAC CATHETERIZATION    . NO PAST SURGERIES      Family History  Problem Relation Age of Onset  . Heart attack Mother   . Hypertension Mother   . Diabetes Mother   . Heart disease Sister   . Hypertension Sister   . Hypertension Father   . Emphysema Father     Social History Reviewed with no changes to be made today.   Outpatient Medications Prior to Visit  Medication Sig Dispense Refill  . albuterol (VENTOLIN HFA) 108 (90 Base) MCG/ACT inhaler Inhale 2 puffs into the lungs every 6 (six) hours as needed for wheezing or shortness of breath (or cough). ONLY AS NEEDED. Not meant for every day use 18 g 1  . ALPRAZolam (XANAX) 0.25 MG tablet Take by mouth.    . budesonide-formoterol (SYMBICORT) 160-4.5 MCG/ACT inhaler Inhale 2 puffs into the lungs 2 (two) times daily. 1 Inhaler 3  . carvedilol (COREG) 25 MG tablet Take 1 tablet (25 mg total) by mouth 2 (two) times daily with a meal. 60 tablet 3  . doxazosin (CARDURA) 4 MG tablet Take 1 tablet (4 mg total) by mouth at bedtime. 90 tablet 1  . hydrALAZINE (APRESOLINE) 100 MG tablet HOLD for 24 hours then resume 128m three times daily 90 tablet 1  . hydrOXYzine (ATARAX/VISTARIL) 25 MG tablet Take 1 tablet (25 mg total) by mouth 3 (three) times daily as needed. 60 tablet 0  . insulin aspart protamine- aspart (NOVOLOG MIX 70/30) (70-30) 100 UNIT/ML injection Inject into the skin.     . Insulin Pen Needle 31G X 6 MM MISC 1 Device by Does not apply route 2 (two) times daily. 100 each 3  . liraglutide (VICTOZA) 18 MG/3ML SOPN NEEDS PASS  SubQ:  Inject 0.6 mg once daily into the skin. Week 2: increase to 1.2 mg once daily; week 3: increase to 1.8 mg once daily 3 pen 6  .  nitroGLYCERIN (NITROSTAT) 0.4 MG SL tablet Place 1 tablet (0.4 mg total) under the tongue every 5 (five) minutes as needed for chest pain. 50 tablet 3  . spironolactone (ALDACTONE) 50 MG tablet Take 1 tablet (50 mg total) by mouth daily. 30 tablet 1  . NIFEdipine (PROCARDIA XL/NIFEDICAL-XL) 90 MG 24 hr tablet Take 1 tablet (90 mg total) by mouth daily. 90 tablet 1  . atorvastatin (LIPITOR) 40 MG tablet Take 1 tablet (40 mg total) by mouth daily. 90 tablet 1  . Insulin Glargine (BASAGLAR KWIKPEN) 100 UNIT/ML Inject 0.4 mLs (40 Units total) into the skin daily. NEEDS DOH (Patient not taking: Reported on 04/24/2020) 12 mL 3  . Misc. Devices MISC Please provide patient with insurance approved  CPAP with the following settings:  autopap 15-20.  Large size [employer] Full Face Mask Forma mask and heated humidification. (Patient not taking: No sig reported) 1 each 0   No facility-administered medications prior to visit.    Allergies  Allergen Reactions  . Aspirin Hives  . Nitroglycerin Other (See Comments)    Nitroglycerin paste will make blood pressure go up and cause headaches. The nitroglycerin tablets do fine.       Objective:    BP (!) 168/98 (BP Location: Left Arm, Patient Position: Sitting, Cuff Size: Large)   Pulse 81   Temp 97.9 F (36.6 C) (Oral)   Ht '5\' 11"'  (1.803 m)   Wt 255 lb (115.7 kg)   SpO2 96%   BMI 35.57 kg/m  Wt Readings from Last 3 Encounters:  04/24/20 255 lb (115.7 kg)  03/13/20 248 lb (112.5 kg)  02/14/20 242 lb 6.4 oz (110 kg)    Physical Exam Vitals and nursing note reviewed.  Constitutional:      Appearance: He is well-developed and well-nourished.  HENT:     Head: Normocephalic and atraumatic.  Eyes:     Extraocular Movements: EOM normal.  Cardiovascular:     Rate and Rhythm: Normal rate and regular rhythm.     Pulses: Intact distal pulses.     Heart sounds: Normal heart sounds. No murmur heard. No friction rub. No gallop.   Pulmonary:      Effort: Pulmonary effort is normal. No tachypnea or respiratory distress.     Breath sounds: Normal breath sounds. No decreased breath sounds, wheezing, rhonchi or rales.  Chest:     Chest wall: No tenderness.  Abdominal:     General: Bowel sounds are normal.     Palpations: Abdomen is soft.  Musculoskeletal:        General: No edema. Normal range of motion.     Cervical back: Normal range of motion.  Skin:    General: Skin is warm and dry.  Neurological:     Mental Status: He is alert and oriented to person, place, and time.     Coordination: Coordination normal.  Psychiatric:        Mood and Affect: Mood and affect normal.        Behavior: Behavior normal. Behavior is cooperative.        Thought Content: Thought content normal.        Judgment: Judgment normal.          Patient has been counseled extensively about nutrition and exercise as well as the importance of adherence with medications and regular follow-up. The patient was given clear instructions to go to ER or return to medical center if symptoms don't improve, worsen or new problems develop. The patient verbalized understanding.   Follow-up: Return in about 2 weeks (around 05/08/2020) for 2 weeks BP check with Lurena Joiner. See me in 3 months.   [name], FNP-BC [facility] [place], [place]   04/24/2020, 9:54 PM

## 2020-04-25 ENCOUNTER — Encounter: Payer: Self-pay | Admitting: Podiatry

## 2020-04-25 ENCOUNTER — Ambulatory Visit (INDEPENDENT_AMBULATORY_CARE_PROVIDER_SITE_OTHER): Payer: No Typology Code available for payment source | Admitting: Podiatry

## 2020-04-25 DIAGNOSIS — M79675 Pain in left toe(s): Secondary | ICD-10-CM

## 2020-04-25 DIAGNOSIS — M2011 Hallux valgus (acquired), right foot: Secondary | ICD-10-CM

## 2020-04-25 DIAGNOSIS — B351 Tinea unguium: Secondary | ICD-10-CM

## 2020-04-25 DIAGNOSIS — E119 Type 2 diabetes mellitus without complications: Secondary | ICD-10-CM

## 2020-04-25 DIAGNOSIS — M2012 Hallux valgus (acquired), left foot: Secondary | ICD-10-CM

## 2020-04-25 DIAGNOSIS — Z794 Long term (current) use of insulin: Secondary | ICD-10-CM

## 2020-04-25 DIAGNOSIS — M79674 Pain in right toe(s): Secondary | ICD-10-CM

## 2020-04-25 LAB — CMP14+EGFR
ALT: 16 IU/L (ref 0–44)
AST: 15 IU/L (ref 0–40)
Albumin/Globulin Ratio: 1.6 (ref 1.2–2.2)
Albumin: 4.1 g/dL (ref 4.0–5.0)
Alkaline Phosphatase: 95 IU/L (ref 44–121)
BUN/Creatinine Ratio: 13 (ref 9–20)
BUN: 30 mg/dL — ABNORMAL HIGH (ref 6–24)
Bilirubin Total: 0.5 mg/dL (ref 0.0–1.2)
CO2: 23 mmol/L (ref 20–29)
Calcium: 9 mg/dL (ref 8.7–10.2)
Chloride: 103 mmol/L (ref 96–106)
Creatinine, Ser: 2.26 mg/dL — ABNORMAL HIGH (ref 0.76–1.27)
GFR calc Af Amer: 38 mL/min/{1.73_m2} — ABNORMAL LOW (ref >59)
GFR calc non Af Amer: 33 mL/min/{1.73_m2} — ABNORMAL LOW (ref >59)
Globulin, Total: 2.6 g/dL (ref 1.5–4.5)
Glucose: 191 mg/dL — ABNORMAL HIGH (ref 65–99)
Potassium: 4.2 mmol/L (ref 3.5–5.2)
Sodium: 141 mmol/L (ref 134–144)
Total Protein: 6.7 g/dL (ref 6.0–8.5)

## 2020-04-25 LAB — LIPID PANEL
Chol/HDL Ratio: 4.6 ratio (ref 0.0–5.0)
Cholesterol, Total: 181 mg/dL (ref 100–199)
HDL: 39 mg/dL — ABNORMAL LOW (ref >39)
LDL Chol Calc (NIH): 106 mg/dL — ABNORMAL HIGH (ref 0–99)
Triglycerides: 210 mg/dL — ABNORMAL HIGH (ref 0–149)
VLDL Cholesterol Cal: 36 mg/dL (ref 5–40)

## 2020-04-25 MED ORDER — CICLOPIROX 8 % EX SOLN
Freq: Every day | CUTANEOUS | 11 refills | Status: DC
Start: 2020-04-25 — End: 2020-07-27

## 2020-04-25 MED FILL — ?BASAGLAR 100 UNITS/ML KWPE: 100 | 30 days supply | Qty: 6 | Fill #0

## 2020-04-25 MED FILL — CICLOPIROX 8% SOLUTION: 8 | 30 days supply | Qty: 7 | Fill #0

## 2020-04-25 NOTE — Patient Instructions (Signed)
For normal skin: Moisturize feet once daily; do not apply between toes A.  CeraVe Daily Moisturizing Lotion B.  Vaseline Intensive Care Lotion C.  Lubriderm Lotion D.  Gold Bond Diabetic Foot Lotion E.  Eucerin Intensive Repair Moisturizing Lotion  For extremely dry, cracked feet: moisturize feet once daily; do not apply between toes A. CeraVe Healing Ointment B. Aquaphor Healing Ointment C. Vaseline Petroleum Healing Jelly   If you have problems reaching your feet: apply to feet once daily; do not apply between toes A.  Aquaphor Advanced Therapy Ointment Body Spray B.  Vaseline Intensive Care Spray Lotion Advanced Repair     Diabetes Mellitus and Foot Care Foot care is an important part of your health, especially when you have diabetes. Diabetes may cause you to have problems because of poor blood flow (circulation) to your feet and legs, which can cause your skin to:  Become thinner and drier.  Break more easily.  Heal more slowly.  Peel and crack. You may also have nerve damage (neuropathy) in your legs and feet, causing decreased feeling in them. This means that you may not notice minor injuries to your feet that could lead to more serious problems. Noticing and addressing any potential problems early is the best way to prevent future foot problems. How to care for your feet Foot hygiene  Wash your feet daily with warm water and mild soap. Do not use hot water. Then, pat your feet and the areas between your toes until they are completely dry. Do not soak your feet as this can dry your skin.  Trim your toenails straight across. Do not dig under them or around the cuticle. File the edges of your nails with an emery board or nail file.  Apply a moisturizing lotion or petroleum jelly to the skin on your feet and to dry, brittle toenails. Use lotion that does not contain alcohol and is unscented. Do not apply lotion between your toes. Shoes and socks  Wear clean socks or  stockings every day. Make sure they are not too tight. Do not wear knee-high stockings since they may decrease blood flow to your legs.  Wear shoes that fit properly and have enough cushioning. Always look in your shoes before you put them on to be sure there are no objects inside.  To break in new shoes, wear them for just a few hours a day. This prevents injuries on your feet. Wounds, scrapes, corns, and calluses  Check your feet daily for blisters, cuts, bruises, sores, and redness. If you cannot see the bottom of your feet, use a mirror or ask someone for help.  Do not cut corns or calluses or try to remove them with medicine.  If you find a minor scrape, cut, or break in the skin on your feet, keep it and the skin around it clean and dry. You may clean these areas with mild soap and water. Do not clean the area with peroxide, alcohol, or iodine.  If you have a wound, scrape, corn, or callus on your foot, look at it several times a day to make sure it is healing and not infected. Check for: ? Redness, swelling, or pain. ? Fluid or blood. ? Warmth. ? Pus or a bad smell. General instructions  Do not cross your legs. This may decrease blood flow to your feet.  Do not use heating pads or hot water bottles on your feet. They may burn your skin. If you have lost feeling in  your feet or legs, you may not know this is happening until it is too late.  Protect your feet from hot and cold by wearing shoes, such as at the beach or on hot pavement.  Schedule a complete foot exam at least once a year (annually) or more often if you have foot problems. If you have foot problems, report any cuts, sores, or bruises to your health care provider immediately. Contact a health care provider if:  You have a medical condition that increases your risk of infection and you have any cuts, sores, or bruises on your feet.  You have an injury that is not healing.  You have redness on your legs or  feet.  You feel burning or tingling in your legs or feet.  You have pain or cramps in your legs and feet.  Your legs or feet are numb.  Your feet always feel cold.  You have pain around a toenail. Get help right away if:  You have a wound, scrape, corn, or callus on your foot and: ? You have pain, swelling, or redness that gets worse. ? You have fluid or blood coming from the wound, scrape, corn, or callus. ? Your wound, scrape, corn, or callus feels warm to the touch. ? You have pus or a bad smell coming from the wound, scrape, corn, or callus. ? You have a fever. ? You have a red line going up your leg. Summary  Check your feet every day for cuts, sores, red spots, swelling, and blisters.  Moisturize feet and legs daily.  Wear shoes that fit properly and have enough cushioning.  If you have foot problems, report any cuts, sores, or bruises to your health care provider immediately.  Schedule a complete foot exam at least once a year (annually) or more often if you have foot problems. This information is not intended to replace advice given to you by your health care provider. Make sure you discuss any questions you have with your health care provider. Document Revised: 01/12/2019 Document Reviewed: 05/23/2016 Elsevier Patient Education  [place].   Diabetic Neuropathy Diabetic neuropathy refers to nerve damage that is caused by diabetes (diabetes mellitus). Over time, people with diabetes can develop nerve damage throughout the body. There are several types of diabetic neuropathy:  Peripheral neuropathy. This is the most common type of diabetic neuropathy. It causes damage to nerves that carry signals between the spinal cord and other parts of the body (peripheral nerves). This usually affects nerves in the feet and legs first, and may eventually affect the hands and arms. The damage affects the ability to sense touch or temperature.  Autonomic neuropathy. This  type causes damage to nerves that control involuntary functions (autonomic nerves). These nerves carry signals that control: ? Heartbeat. ? Body temperature. ? Blood pressure. ? Urination. ? Digestion. ? Sweating. ? Sexual function. ? Response to changing blood sugar (glucose) levels.  Focal neuropathy. This type of nerve damage affects one area of the body, such as an arm, a leg, or the face. The injury may involve one nerve or a small group of nerves. Focal neuropathy can be painful and unpredictable, and occurs most often in older adults with diabetes. This often develops suddenly, but usually improves over time and does not cause long-term problems.  Proximal neuropathy. This type of nerve damage affects the nerves of the thighs, hips, buttocks, or legs. It causes severe pain, weakness, and muscle death (atrophy), usually in the thigh muscles.  It is more common among older men and people who have type 2 diabetes. The length of recovery time may vary. What are the causes? Peripheral, autonomic, and focal neuropathies are caused by diabetes that is not well controlled with treatment. The cause of proximal neuropathy is not known, but it may be caused by inflammation related to uncontrolled blood glucose levels. What are the signs or symptoms? Peripheral neuropathy Peripheral neuropathy develops slowly over time. When the nerves of the feet and legs no longer work, you may experience:  Burning, stabbing, or aching pain in the legs or feet.  Pain or cramping in the legs or feet.  Loss of feeling (numbness) and inability to feel pressure or pain in the feet. This can lead to: ? Thick calluses or sores on areas of constant pressure. ? Ulcers. ? Reduced ability to feel temperature changes.  Foot deformities.  Muscle weakness.  Loss of balance or coordination. Autonomic neuropathy The symptoms of autonomic neuropathy vary depending on which nerves are affected. Symptoms may  include:  Problems with digestion, such as: ? Nausea or vomiting. ? Poor appetite. ? Bloating. ? Diarrhea or constipation. ? Trouble swallowing. ? Losing weight without trying to.  Problems with the heart, blood and lungs, such as: ? Dizziness, especially when standing up. ? Fainting. ? Shortness of breath. ? Irregular heartbeat.  Bladder problems, such as: ? Trouble starting or stopping urination. ? Leaking urine. ? Trouble emptying the bladder. ? Urinary tract infections (UTIs).  Problems with other body functions, such as: ? Sweat. You may sweat too much or too little. ? Temperature. You might get hot easily. Or, you might feel cold more than usual. ? Sexual function. Men may not be able to get or maintain an erection. Women may have vaginal dryness and difficulty with arousal. Focal neuropathy Symptoms affect only one area of the body. Common symptoms include:  Numbness.  Tingling.  Burning pain.  Prickling feeling.  Very sensitive skin.  Weakness.  Inability to move (paralysis).  Muscle twitching.  Muscles getting smaller (wasting).  Poor coordination.  Double or blurred vision. Proximal neuropathy  Sudden, severe pain in the hip, thigh, or buttocks. Pain may spread from the back into the legs (sciatica).  Pain and numbness in the arms and legs.  Tingling.  Loss of bladder control or bowel control.  Weakness and wasting of thigh muscles.  Difficulty getting up from a seated position.  Abdominal swelling.  Unexplained weight loss. How is this diagnosed? Diagnosis usually involves reviewing your medical history and any symptoms you have. Diagnosis varies depending on the type of neuropathy your health care provider suspects. Peripheral neuropathy Your health care provider will check areas that are affected by your nervous system (neurologic exam), such as your reflexes, how you move, and what you can feel. You may have other tests, such  as:  Blood tests.  Removal and examination of fluid that surrounds the spinal cord (lumbar puncture).  CT scan.  MRI.  A test to check the nerves that control muscles (electromyogram, EMG).  Tests of how quickly messages pass through your nerves (nerve conduction velocity tests).  Removal of a small piece of nerve to be examined under a microscope (biopsy). Autonomic neuropathy You may have tests, such as:  Tests to measure your blood pressure and heart rate. This may include monitoring you while you are safely secured to an exam table that moves you from a lying position to an upright position (table tilt test).  Breathing tests to check your lungs.  Tests to check how food moves through the digestive system (gastric emptying tests).  Blood, sweat, or urine tests.  Ultrasound of your bladder.  Spinal fluid tests. Focal neuropathy This condition may be diagnosed with:  A neurologic exam.  CT scan.  MRI.  EMG.  Nerve conduction velocity tests. Proximal neuropathy There is no test to diagnose this type of neuropathy. You may have tests to rule out other possible causes of this type of neuropathy. Tests may include:  X-rays of your spine and lumbar region.  Lumbar puncture.  MRI. How is this treated? The goal of treatment is to keep nerve damage from getting worse. The most important part of treatment is keeping your blood glucose level and your A1C level within your target range by following your diabetes management plan. Over time, maintaining lower blood glucose levels helps lessen symptoms. In some cases, you may need prescription pain medicine. Follow these instructions at home:  Lifestyle   Do not use any products that contain nicotine or tobacco, such as cigarettes and e-cigarettes. If you need help quitting, ask your health care provider.  Be physically active every day. Include strength training and balance exercises.  Follow a healthy meal  plan.  Work with your health care provider to manage your blood pressure. General instructions  Follow your diabetes management plan as directed. ? Check your blood glucose levels as directed by your health care provider. ? Keep your blood glucose in your target range as directed by your health care provider. ? Have your A1C level checked at least two times a year, or as often as told by your health care provider.  Take over the counter and prescription medicines only as told by your health care provider. This includes insulin and diabetes medicine.  Do not drive or use heavy machinery while taking prescription pain medicines.  Check your skin and feet every day for cuts, bruises, redness, blisters, or sores.  Keep all follow up visits as told by your health care provider. This is important. Contact a health care provider if:  You have burning, stabbing, or aching pain in your legs or feet.  You are unable to feel pressure or pain in your feet.  You develop problems with digestion, such as: ? Nausea. ? Vomiting. ? Bloating. ? Constipation. ? Diarrhea. ? Abdominal pain.  You have difficulty with urination, such as inability: ? To control when you urinate (incontinence). ? To completely empty the bladder (retention).  You have palpitations.  You feel dizzy, weak, or faint when you stand up. Get help right away if:  You cannot urinate.  You have sudden weakness or loss of coordination.  You have trouble speaking.  You have pain or pressure in your chest.  You have an irregular heart beat.  You have sudden inability to move a part of your body. Summary  Diabetic neuropathy refers to nerve damage that is caused by diabetes. It can affect nerves throughout the entire body, causing numbness and pain in the arms, legs, digestive tract, heart, and other body systems.  Keep your blood glucose level and your blood pressure in your target range, as directed by your health  care provider. This can help prevent neuropathy from getting worse.  Check your skin and feet every day for cuts, bruises, redness, blisters, or sores.  Do not use any products that contain nicotine or tobacco, such as cigarettes and e-cigarettes. If you need help quitting, ask your  health care provider. This information is not intended to replace advice given to you by your health care provider. Make sure you discuss any questions you have with your health care provider. Document Revised: 06/03/2017 Document Reviewed: 05/26/2016 Elsevier Patient Education  [place].  Onychomycosis/Fungal Toenails  WHAT IS IT? An infection that lies within the keratin of your nail plate that is caused by a fungus.  WHY ME? Fungal infections affect all ages, sexes, races, and creeds.  There may be many factors that predispose you to a fungal infection such as age, coexisting medical conditions such as diabetes, or an autoimmune disease; stress, medications, fatigue, genetics, etc.  Bottom line: fungus thrives in a warm, moist environment and your shoes offer such a location.  IS IT CONTAGIOUS? Theoretically, yes.  You do not want to share shoes, nail clippers or files with someone who has fungal toenails.  Walking around barefoot in the same room or sleeping in the same bed is unlikely to transfer the organism.  It is important to realize, however, that fungus can spread easily from one nail to the next on the same foot.  HOW DO WE TREAT THIS?  There are several ways to treat this condition.  Treatment may depend on many factors such as age, medications, pregnancy, liver and kidney conditions, etc.  It is best to ask your doctor which options are available to you.  9. No treatment.   Unlike many other medical concerns, you can live with this condition.  However for many people this can be a painful condition and may lead to ingrown toenails or a bacterial infection.  It is recommended that you keep the  nails cut short to help reduce the amount of fungal nail. 10. Topical treatment.  These range from herbal remedies to prescription strength nail lacquers.  About 40-50% effective, topicals require twice daily application for approximately 9 to 12 months or until an entirely new nail has grown out.  The most effective topicals are medical grade medications available through physicians offices. 11. Oral antifungal medications.  With an 80-90% cure rate, the most common oral medication requires 3 to 4 months of therapy and stays in your system for a year as the new nail grows out.  Oral antifungal medications do require blood work to make sure it is a safe drug for you.  A liver function panel will be performed prior to starting the medication and after the first month of treatment.  It is important to have the blood work performed to avoid any harmful side effects.  In general, this medication safe but blood work is required. 12. Laser Therapy.  This treatment is performed by applying a specialized laser to the affected nail plate.  This therapy is noninvasive, fast, and non-painful.  It is not covered by insurance and is therefore, out of pocket.  The results have been very good with a 80-95% cure rate.  [facility] is the only practice in the area to offer this therapy. 13. Permanent Nail Avulsion.  Removing the entire nail so that a new nail will not grow back.

## 2020-05-04 NOTE — Progress Notes (Signed)
Subjective: Matthew Odom presents today referred by [name], NP for diabetic foot evaluation.  Patient relates >5  year history of diabetes. He states blood glucose was 168 mg/dl yesterday morning. He did not check blood glucose today.  His wife is present during today's visit.  Patient denies any history of foot wounds.  Patient relates burning sensationss of his feet..  Today, patient c/o of painful, discolored, thick toenails which interfere with daily activities.  Pain is aggravated when wearing enclosed shoe gear.   Past Medical History:  Diagnosis Date  . Cocaine use   . Diabetes mellitus without complication ([place])   . Enlarged heart   . History of degenerative disc disease   . Hyperlipidemia   . Hypertension   . NSTEMI (non-ST elevated myocardial infarction) ([place])   . Sciatic nerve pain     Patient Active Problem List   Diagnosis Date Noted  . Orthostatic hypotension 11/28/2019  . Type 2 diabetes mellitus, with long-term current use of insulin ([place]) 09/27/2018  . Chest pain 06/29/2018  . Degenerative spondylolisthesis 04/04/2018  . Lumbar radiculopathy 04/04/2018  . Spinal stenosis of lumbar region with neurogenic claudication 04/04/2018  . Synovial cyst of lumbar facet joint 04/04/2018  . CKD (chronic kidney disease), stage III ([place]) 01/03/2018  . HTN (hypertension) 01/02/2018  . Hyperlipemia 01/02/2018  . Abnormal EKG 01/02/2018  . COPD with acute bronchitis ([place]) 01/02/2018  . Nicotine abuse 01/02/2018  . Chronic combined systolic and diastolic CHF, NYHA class 3 ([place]) 02/15/2016  . LVH (left ventricular hypertrophy) due to hypertensive disease 02/15/2016  . Nonischemic cardiomyopathy ([place]) 03/23/2015    Past Surgical History:  Procedure Laterality Date  . CARDIAC CATHETERIZATION    . NO PAST SURGERIES      Current Outpatient Medications on File Prior to Visit  Medication Sig Dispense Refill  . albuterol (VENTOLIN HFA) 108 (90 Base) MCG/ACT  inhaler Inhale 2 puffs into the lungs every 6 (six) hours as needed for wheezing or shortness of breath (or cough). ONLY AS NEEDED. Not meant for every day use 18 g 1  . ALPRAZolam (XANAX) 0.25 MG tablet Take by mouth.    Marland Kitchen amLODipine (NORVASC) 10 MG tablet Take 1 tablet (10 mg total) by mouth daily. 90 tablet 3  . budesonide-formoterol (SYMBICORT) 160-4.5 MCG/ACT inhaler Inhale 2 puffs into the lungs 2 (two) times daily. 1 Inhaler 3  . carvedilol (COREG) 25 MG tablet Take 1 tablet (25 mg total) by mouth 2 (two) times daily with a meal. 60 tablet 3  . chlorhexidine (PERIDEX) 0.12 % solution Use as directed 15 mLs in the mouth or throat 2 (two) times daily. 473 mL 0  . doxazosin (CARDURA) 4 MG tablet Take 1 tablet (4 mg total) by mouth at bedtime. 90 tablet 1  . hydrALAZINE (APRESOLINE) 100 MG tablet HOLD for 24 hours then resume 100mg  three times daily 90 tablet 1  . hydrOXYzine (ATARAX/VISTARIL) 25 MG tablet Take 1 tablet (25 mg total) by mouth 3 (three) times daily as needed. 60 tablet 0  . Insulin Glargine (BASAGLAR KWIKPEN) 100 UNIT/ML Inject 20 Units into the skin daily. NEEDS DOH 6 mL 3  . Insulin Pen Needle 31G X 6 MM MISC 1 Device by Does not apply route 2 (two) times daily. 100 each 3  . liraglutide (VICTOZA) 18 MG/3ML SOPN Inject 1.8 mg into the skin daily. NEEDS PASS 9 mL 6  . Misc. Devices MISC Please provide patient with insurance approved CPAP with  the following settings:  autopap 15-20.  Large size [employer] Full Face Mask Forma mask and heated humidification. (Patient taking differently: Please provide patient with insurance approved CPAP with the following settings:  autopap 15-20.  Large size [employer] Full Face Mask Forma mask and heated humidification.) 1 each 0  . nitroGLYCERIN (NITROSTAT) 0.4 MG SL tablet Place 1 tablet (0.4 mg total) under the tongue every 5 (five) minutes as needed for chest pain. 50 tablet 3  . spironolactone (ALDACTONE) 50 MG tablet Take 1 tablet (50  mg total) by mouth daily. 30 tablet 1  . atorvastatin (LIPITOR) 40 MG tablet Take 1 tablet (40 mg total) by mouth daily. 90 tablet 1   No current facility-administered medications on file prior to visit.     Allergies  Allergen Reactions  . Aspirin Hives  . Nitroglycerin Other (See Comments)    Nitroglycerin paste will make blood pressure go up and cause headaches. The nitroglycerin tablets do fine.    Social History   Occupational History  . Not on file  Tobacco Use  . Smoking status: Current Every Day Smoker    Packs/day: 1.00    Years: 40.00    Pack years: 40.00    Types: Cigarettes  . Smokeless tobacco: Never Used  Substance and Sexual Activity  . Alcohol use: Yes    Comment: occasionally  . Drug use: Never  . Sexual activity: Yes    Family History  Problem Relation Age of Onset  . Heart attack Mother   . Hypertension Mother   . Diabetes Mother   . Heart disease Sister   . Hypertension Sister   . Hypertension Father   . Emphysema Father     Immunization History  Administered Date(s) Administered  . PFIZER SARS-COV-2 Vaccination 12/24/2019, 01/14/2020    Objective: There were no vitals filed for this visit.  Matthew Odom is a pleasant 49 y.o. male morbidly obese in NAD. AAO X 3.  Vascular Examination: Capillary fill time to digits <3 seconds b/l lower extremities. Palpable pedal pulses b/l LE. Pedal hair sparse. Lower extremity skin temperature gradient within normal limits.  Dermatological Examination: Pedal skin with normal turgor, texture and tone bilaterally. No open wounds bilaterally. No interdigital macerations bilaterally. Toenails 1-5 b/l elongated, discolored, dystrophic, thickened, crumbly with subungual debris and tenderness to dorsal palpation.  Musculoskeletal Examination: Normal muscle strength 5/5 to all lower extremity muscle groups bilaterally. Hallux valgus with bunion deformity noted b/l lower extremities. Patient ambulates  independent of any assistive aids.  Footwear Assessment: Does the patient wear appropriate shoes? Yes.. Does the patient need inserts/orthotics? No..  Neurological Examination: Protective sensation intact 5/5 intact bilaterally with 10g monofilament b/l. Vibratory sensation decreased b/l.  Hemoglobin A1C Latest Ref Rng & Units 04/24/2020 01/31/2020 10/07/2019  HGBA1C 4.0 - 5.6 % 7.0(A) 7.2(H) 8.3(A)  Some recent data might be hidden   Assessment: 1. Pain due to onychomycosis of toenails of both feet   2. Hallux valgus, acquired, bilateral   3. Type 2 diabetes mellitus without complication, with long-term current use of insulin ([facility])      ADA Risk Categorization: Low Risk:  Patient has all of the following: Intact protective sensation No prior foot ulcer  No severe deformity Pedal pulses present  Plan: -Examined patient. -Diabetic foot examination performed on today's visit. -Patient to continue soft, supportive shoe gear daily. -Discussed topical, laser and oral medication. Patient opted for topical treatment. Rx sent to pharmacy for 8% Ciclopirox Solution.  Apply one  coat to each toenail once daily for 48 weeks. Remove once weekly with nail polish remover. -Toenails 1-5 b/l were debrided in length and girth with sterile nail nippers and dremel without iatrogenic bleeding.  -Patient to report any pedal injuries to medical professional immediately. -Patient/POA to call should there be question/concern in the interim.  Return in about 3 months (around 07/24/2020) for diabetic foot care.

## 2020-05-08 ENCOUNTER — Telehealth: Payer: Self-pay | Admitting: Nurse Practitioner

## 2020-05-08 ENCOUNTER — Other Ambulatory Visit: Payer: Self-pay | Admitting: Nurse Practitioner

## 2020-05-08 ENCOUNTER — Encounter: Payer: Self-pay | Admitting: Pharmacist

## 2020-05-08 ENCOUNTER — Other Ambulatory Visit: Payer: Self-pay

## 2020-05-08 ENCOUNTER — Ambulatory Visit: Payer: Self-pay | Attending: Nurse Practitioner | Admitting: Pharmacist

## 2020-05-08 VITALS — BP 132/84

## 2020-05-08 DIAGNOSIS — I10 Essential (primary) hypertension: Secondary | ICD-10-CM

## 2020-05-08 DIAGNOSIS — K089 Disorder of teeth and supporting structures, unspecified: Secondary | ICD-10-CM

## 2020-05-08 MED FILL — ?ATORVASTATIN 40MG TABLET: 40 | 30 days supply | Qty: 30 | Fill #2

## 2020-05-08 MED FILL — DOXAZOSIN MESYLATE 4 MG TAB: 4 | 30 days supply | Qty: 30 | Fill #2

## 2020-05-08 MED FILL — ?SPIRONOLACTONE 50 MG TABLE: 50 | 30 days supply | Qty: 30 | Fill #1

## 2020-05-08 MED FILL — ?CARVEDILOL 25 MG TABLET: 25 | 30 days supply | Qty: 60 | Fill #2

## 2020-05-08 NOTE — Telephone Encounter (Signed)
Patient came in saying that he was prescribed chlorhexidine (PERIDEX) 0.12 % solution and his daughter spilled it. Patient would like to know if he can get a new Rx. Please f/u

## 2020-05-08 NOTE — Progress Notes (Signed)
   S: PCP: Zelda      Patient arrives in good spirits. Presents to the clinic for hypertension evaluation, counseling, and management. Patient was referred and last seen by Primary Care Provider on 04/24/2020.   Medication adherence reported. However, I placed refills for this patient in our pharmacy today. He has not picked-up refills for spironolactone, carvedilol, or hydralazine since October of this year. We only gave him #30 day supply at that time.  Of note, pt admits that he may get busy with appointments and miss his doses occasionally.   Current BP Medications include:  Amlodipine 10 mg daily, hydralazine 100 mg TID, carvedilol 25 mg BID, spironolactone 50 mg daily, doxazosin 8 mg daily  Dietary habits include: admits to dietary indiscretion; has been consuming increased amounts of sodium over the past couple of months  Exercise habits include: none  Family / Social history:  - FHx: MI, HTN, DM, heart disease  - Tobacco:  Current 1 PPD smoker  - Alcohol: no use reported   O:  Vitals:   05/08/20 1738  BP: 132/84   Home BP readings: none; has not checked in several weeks   Last 3 Office BP readings: BP Readings from Last 3 Encounters:  05/08/20 132/84  04/24/20 (!) 168/98  03/13/20 (!) 142/90    BMET    Component Value Date/Time   NA 141 04/24/2020 0938   K 4.2 04/24/2020 0938   CL 103 04/24/2020 0938   CO2 23 04/24/2020 0938   GLUCOSE 191 (H) 04/24/2020 0938   GLUCOSE 215 (H) 06/29/2018 0343   BUN 30 (H) 04/24/2020 0938   CREATININE 2.26 (H) 04/24/2020 0938   CALCIUM 9.0 04/24/2020 0938   GFRNONAA 33 (L) 04/24/2020 0938   GFRAA 38 (L) 04/24/2020 0998    Renal function: CrCl cannot be calculated (Unknown ideal weight.).  Clinical ASCVD: Yes  The ASCVD Risk score Mikey Bussing DC Jr., et al., 2013) failed to calculate for the following reasons:   The patient has a prior MI or stroke diagnosis   A/P: Hypertension longstanding. BP in clinic has been consistently  elevated in the past. He is currently close to goal in clinic today. BP Goal = < 130/80 mmHg. Medication adherence reported.  -Continued current regimen. Will reach out to his [facility] team to make them aware.   -Counseled on lifestyle modifications for blood pressure control including reduced dietary sodium, increased exercise, adequate sleep.  Results reviewed and written information provided.   Total time in face-to-face counseling 30 minutes.   F/U Clinic Visit in March with PCP.   Benard Halsted, PharmD, [facility] [place], [place] [phone]

## 2020-05-09 ENCOUNTER — Other Ambulatory Visit: Payer: Self-pay | Admitting: Nurse Practitioner

## 2020-05-09 ENCOUNTER — Other Ambulatory Visit: Payer: Self-pay

## 2020-05-09 MED ORDER — PENICILLIN V POTASSIUM 500 MG PO TABS
500.0000 mg | ORAL_TABLET | Freq: Four times a day (QID) | ORAL | 0 refills | Status: DC
Start: 2020-05-09 — End: 2020-05-09

## 2020-05-09 MED FILL — PENICILLIN VK 500 MG TABLET: 500 | 7 days supply | Qty: 28 | Fill #0

## 2020-05-09 MED FILL — CHLORHEXIDINE 0.12% RINSE: 0.12 | 15 days supply | Qty: 473 | Fill #0

## 2020-05-10 NOTE — Telephone Encounter (Signed)
Rx was resent by PCP.

## 2020-06-04 ENCOUNTER — Ambulatory Visit: Payer: Self-pay | Admitting: Physician Assistant

## 2020-06-08 ENCOUNTER — Ambulatory Visit: Payer: Self-pay | Attending: Nurse Practitioner

## 2020-06-08 ENCOUNTER — Other Ambulatory Visit: Payer: Self-pay

## 2020-07-16 ENCOUNTER — Other Ambulatory Visit: Payer: Self-pay | Admitting: Internal Medicine

## 2020-07-17 ENCOUNTER — Other Ambulatory Visit: Payer: Self-pay | Admitting: Nurse Practitioner

## 2020-07-17 ENCOUNTER — Other Ambulatory Visit: Payer: Self-pay | Admitting: Pharmacist

## 2020-07-17 DIAGNOSIS — I10 Essential (primary) hypertension: Secondary | ICD-10-CM

## 2020-07-17 MED ORDER — SPIRONOLACTONE 50 MG PO TABS: 50.0000 mg | ORAL_TABLET | Freq: Every day | ORAL | 1 refills | Status: DC

## 2020-07-20 ENCOUNTER — Other Ambulatory Visit: Payer: Self-pay | Admitting: Internal Medicine

## 2020-07-24 ENCOUNTER — Other Ambulatory Visit: Payer: Self-pay

## 2020-07-24 ENCOUNTER — Telehealth: Payer: Self-pay | Admitting: Nurse Practitioner

## 2020-07-24 ENCOUNTER — Ambulatory Visit: Payer: Self-pay | Attending: Nurse Practitioner | Admitting: Nurse Practitioner

## 2020-07-24 NOTE — Telephone Encounter (Signed)
No answer. LVM to return call to office

## 2020-07-27 ENCOUNTER — Ambulatory Visit (INDEPENDENT_AMBULATORY_CARE_PROVIDER_SITE_OTHER): Payer: No Typology Code available for payment source | Admitting: Podiatry

## 2020-07-27 ENCOUNTER — Other Ambulatory Visit: Payer: Self-pay

## 2020-07-27 ENCOUNTER — Encounter: Payer: Self-pay | Admitting: Podiatry

## 2020-07-27 ENCOUNTER — Other Ambulatory Visit: Payer: Self-pay | Admitting: Podiatry

## 2020-07-27 DIAGNOSIS — Z794 Long term (current) use of insulin: Secondary | ICD-10-CM

## 2020-07-27 DIAGNOSIS — M79674 Pain in right toe(s): Secondary | ICD-10-CM

## 2020-07-27 DIAGNOSIS — E119 Type 2 diabetes mellitus without complications: Secondary | ICD-10-CM

## 2020-07-27 DIAGNOSIS — M79675 Pain in left toe(s): Secondary | ICD-10-CM

## 2020-07-27 DIAGNOSIS — B351 Tinea unguium: Secondary | ICD-10-CM

## 2020-07-27 MED ORDER — CICLOPIROX 8 % EX SOLN: Freq: Every day | CUTANEOUS | 11 refills | Status: DC

## 2020-07-27 MED FILL — CICLOPIROX 8% SOLUTION: 8 | 30 days supply | Qty: 7 | Fill #0

## 2020-07-27 NOTE — Patient Instructions (Signed)
Ciclopirox nail solution What is this medicine? CICLOPIROX (sye kloe PEER ox) NAIL SOLUTION is an antifungal medicine. It used to treat fungal infections of the nails. This medicine may be used for other purposes; ask your health care provider or pharmacist if you have questions. COMMON BRAND NAME(S): CNL8, Penlac What should I tell my health care provider before I take this medicine? They need to know if you have any of these conditions:  diabetes mellitus  history of seizures  HIV infection  immune system problems or organ transplant  large areas of burned or damaged skin  peripheral vascular disease or poor circulation  taking corticosteroid medication (including steroid inhalers, cream, or lotion)  an unusual or allergic reaction to ciclopirox, isopropyl alcohol, other medicines, foods, dyes, or preservatives  pregnant or trying to get pregnant  breast-feeding How should I use this medicine? This medicine is for external use only. Follow the directions that come with this medicine exactly. Wash and dry your hands before use. Avoid contact with the eyes, mouth or nose. If you do get this medicine in your eyes, rinse out with plenty of cool tap water. Contact your doctor or health care professional if eye irritation occurs. Use at regular intervals. Do not use your medicine more often than directed. Finish the full course prescribed by your doctor or health care professional even if you think you are better. Do not stop using except on your doctor's advice. Talk to your pediatrician regarding the use of this medicine in children. While this medicine may be prescribed for children as young as 12 years for selected conditions, precautions do apply. Overdosage: If you think you have taken too much of this medicine contact a poison control center or emergency room at once. NOTE: This medicine is only for you. Do not share this medicine with others. What if I miss a dose? If you miss a  dose, use it as soon as you can. If it is almost time for your next dose, use only that dose. Do not use double or extra doses. What may interact with this medicine? Interactions are not expected. Do not use any other skin products without telling your doctor or health care professional. This list may not describe all possible interactions. Give your health care provider a list of all the medicines, herbs, non-prescription drugs, or dietary supplements you use. Also tell them if you smoke, drink alcohol, or use illegal drugs. Some items may interact with your medicine. What should I watch for while using this medicine? Tell your doctor or health care professional if your symptoms get worse. Four to six months of treatment may be needed for the nail(s) to improve. Some people may not achieve a complete cure or clearing of the nails by this time. Tell your doctor or health care professional if you develop sores or blisters that do not heal properly. If your nail infection returns after stopping using this product, contact your doctor or health care professional. What side effects may I notice from receiving this medicine? Side effects that you should report to your doctor or health care professional as soon as possible:  allergic reactions like skin rash, itching or hives, swelling of the face, lips, or tongue  severe irritation, redness, burning, blistering, peeling, swelling, oozing Side effects that usually do not require medical attention (report to your doctor or health care professional if they continue or are bothersome):  mild reddening of the skin  nail discoloration  temporary burning or mild   stinging at the site of application This list may not describe all possible side effects. Call your doctor for medical advice about side effects. You may report side effects to FDA at 1-800-FDA-1088. Where should I keep my medicine? Keep out of the reach of children. Store at room temperature  between 15 and 30 degrees C (59 and 86 degrees F). Do not freeze. Protect from light by storing the bottle in the carton after every use. This medicine is flammable. Keep away from heat and flame. Throw away any unused medicine after the expiration date. NOTE: This sheet is a summary. It may not cover all possible information. If you have questions about this medicine, talk to your doctor, pharmacist, or health care provider.  2021 Elsevier/Gold Standard (2007-07-26 16:49:20)  

## 2020-07-27 NOTE — Progress Notes (Signed)
Subjective: SUMMIT GROMEK presents today referred by [name], NP for preventative diabetic foot care.  Patient relates >5  year history of diabetes. He states blood glucose was 168 mg/dl yesterday morning. He did not check blood glucose today.  His wife is present during today's visit. They state they did not receive prescription for nail lacquer on last visit.  Patient c/o of painful, discolored, thick toenails, especially right 2nd toe which interfere with daily activities.  Pain is aggravated when wearing enclosed shoe gear. He relates improvement in symptoms with debridement of toenails.  Allergies  Allergen Reactions  . Aspirin Hives  . Nitroglycerin Other (See Comments)    Nitroglycerin paste will make blood pressure go up and cause headaches. The nitroglycerin tablets do fine.   Objective: There were no vitals filed for this visit.  JUN ZEITZ is a pleasant 50 y.o. male morbidly obese in NAD. AAO X 3.  Vascular Examination: Capillary fill time to digits <3 seconds b/l lower extremities. Palpable pedal pulses b/l LE. Pedal hair sparse. Lower extremity skin temperature gradient within normal limits.  Dermatological Examination: Pedal skin with normal turgor, texture and tone bilaterally. No open wounds bilaterally. No interdigital macerations bilaterally. Toenails 1-5 b/l elongated, discolored, dystrophic, thickened, crumbly with subungual debris and tenderness to dorsal palpation.  Musculoskeletal Examination: Normal muscle strength 5/5 to all lower extremity muscle groups bilaterally. Hallux valgus with bunion deformity noted b/l lower extremities. Patient ambulates independent of any assistive aids.  Neurological Examination: Protective sensation intact 5/5 intact bilaterally with 10g monofilament b/l. Vibratory sensation decreased b/l.  Hemoglobin A1C Latest Ref Rng & Units 04/24/2020 01/31/2020 10/07/2019  HGBA1C 4.0 - 5.6 % 7.0(A) 7.2(H) 8.3(A)  Some recent data  might be hidden   Assessment: 1. Pain due to onychomycosis of toenails of both feet   2. Type 2 diabetes mellitus without complication, with long-term current use of insulin ([place])     Plan: -Examined patient. -Continue diabetic foot care principles. -Resent Rx for Penlac Nail Lacquer to pharmacy today. -Patient to continue soft, supportive shoe gear daily. -Toenails 1-5 b/l were debrided in length and girth with sterile nail nippers and dremel without iatrogenic bleeding.  -Patient to report any pedal injuries to medical professional immediately. -Patient/POA to call should there be question/concern in the interim.  Return in about 3 months (around 10/27/2020).

## 2020-08-04 ENCOUNTER — Other Ambulatory Visit: Payer: Self-pay

## 2020-08-13 ENCOUNTER — Other Ambulatory Visit: Payer: Self-pay | Admitting: Nurse Practitioner

## 2020-08-13 ENCOUNTER — Other Ambulatory Visit: Payer: Self-pay

## 2020-08-13 DIAGNOSIS — I214 Non-ST elevation (NSTEMI) myocardial infarction: Secondary | ICD-10-CM

## 2020-08-13 DIAGNOSIS — F419 Anxiety disorder, unspecified: Secondary | ICD-10-CM

## 2020-08-13 MED FILL — Losartan Potassium Tab 25 MG: ORAL | 30 days supply | Qty: 30 | Fill #0 | Status: AC

## 2020-08-13 MED FILL — Atorvastatin Calcium Tab 40 MG (Base Equivalent): ORAL | 30 days supply | Qty: 30 | Fill #0 | Status: AC

## 2020-08-13 MED FILL — Amlodipine Besylate Tab 10 MG (Base Equivalent): ORAL | 30 days supply | Qty: 30 | Fill #0 | Status: AC

## 2020-08-13 MED FILL — Doxazosin Mesylate Tab 4 MG: ORAL | 30 days supply | Qty: 30 | Fill #0 | Status: AC

## 2020-08-13 MED FILL — Tamsulosin HCl Cap 0.4 MG: ORAL | 30 days supply | Qty: 30 | Fill #0 | Status: AC

## 2020-08-13 MED FILL — Spironolactone Tab 25 MG: ORAL | 30 days supply | Qty: 30 | Fill #0 | Status: AC

## 2020-08-13 MED FILL — Ciclopirox Solution 8%: CUTANEOUS | 30 days supply | Qty: 6.6 | Fill #0 | Status: AC

## 2020-08-13 MED FILL — Carvedilol Tab 25 MG: ORAL | 30 days supply | Qty: 60 | Fill #0 | Status: AC

## 2020-08-13 NOTE — Telephone Encounter (Signed)
   Notes to clinic:  Review for continued use and refill Scripts were last filled last year     Requested Prescriptions  Pending Prescriptions Disp Refills   nitroGLYCERIN (NITROSTAT) 0.4 MG SL tablet 50 tablet 3    Sig: Place 1 tablet (0.4 mg total) under the tongue every 5 (five) minutes as needed for chest pain.      Cardiovascular:  Nitrates Passed - 08/13/2020 10:40 AM      Passed - Last BP in normal range    BP Readings from Last 1 Encounters:  05/08/20 132/84          Passed - Last Heart Rate in normal range    Pulse Readings from Last 1 Encounters:  04/24/20 81          Passed - Valid encounter within last 12 months    Recent Outpatient Visits           3 months ago Essential hypertension   [place], Annie Main L, RPH-CPP   3 months ago Type 2 diabetes mellitus with hyperglycemia, with long-term current use of insulin [facility])   [place], Vernia Buff, NP   6 months ago Right non-suppurative otitis media   [place], [place] W, NP   6 months ago Right non-suppurative otitis media   [place], [place], MD   8 months ago Orthostatic hypotension   [place] Elsie Stain, MD                  hydrOXYzine (ATARAX/VISTARIL) 25 MG tablet 60 tablet 0    Sig: Take 1 tablet (25 mg total) by mouth 3 (three) times daily as needed.      Ear, Nose, and Throat:  Antihistamines Passed - 08/13/2020 10:40 AM      Passed - Valid encounter within last 12 months    Recent Outpatient Visits           3 months ago Essential hypertension   [place], Jarome Matin, RPH-CPP   3 months ago Type 2 diabetes mellitus with hyperglycemia, with long-term current use of insulin [facility])   [place], Vernia Buff, NP   6 months ago  Right non-suppurative otitis media   [place], Vernia Buff, NP   6 months ago Right non-suppurative otitis media   [place], [place], MD   8 months ago Orthostatic hypotension   [place] Elsie Stain, MD

## 2020-08-14 MED ORDER — NITROGLYCERIN 0.4 MG SL SUBL
0.4000 mg | SUBLINGUAL_TABLET | SUBLINGUAL | 0 refills | Status: DC | PRN
  Filled 2020-08-14: qty 25, 10d supply, fill #0

## 2020-08-14 MED ORDER — HYDROXYZINE HCL 25 MG PO TABS
25.0000 mg | ORAL_TABLET | Freq: Three times a day (TID) | ORAL | 0 refills | Status: DC | PRN
  Filled 2020-08-14: qty 60, 20d supply, fill #0

## 2020-08-15 ENCOUNTER — Other Ambulatory Visit: Payer: Self-pay

## 2020-08-22 ENCOUNTER — Other Ambulatory Visit: Payer: Self-pay

## 2020-09-20 ENCOUNTER — Other Ambulatory Visit: Payer: Self-pay | Admitting: Internal Medicine

## 2020-09-20 DIAGNOSIS — N1832 Chronic kidney disease, stage 3b: Secondary | ICD-10-CM

## 2020-09-25 ENCOUNTER — Ambulatory Visit
Admission: RE | Admit: 2020-09-25 | Discharge: 2020-09-25 | Disposition: A | Discharge status: Home / Self Care | Payer: No Typology Code available for payment source | Source: Ambulatory Visit | Attending: Internal Medicine | Admitting: Internal Medicine

## 2020-09-25 DIAGNOSIS — N1832 Chronic kidney disease, stage 3b: Secondary | ICD-10-CM

## 2020-10-30 ENCOUNTER — Other Ambulatory Visit: Payer: Self-pay | Admitting: Cardiovascular Disease

## 2020-10-30 ENCOUNTER — Other Ambulatory Visit: Payer: Self-pay

## 2020-10-30 DIAGNOSIS — I10 Essential (primary) hypertension: Secondary | ICD-10-CM

## 2020-10-30 MED ORDER — TAMSULOSIN HCL 0.4 MG PO CAPS
ORAL_CAPSULE | ORAL | 3 refills | Status: AC
  Filled 2020-10-30: qty 30, 30d supply, fill #0
  Filled 2021-02-05: qty 30, 30d supply, fill #1

## 2020-10-30 MED FILL — Losartan Potassium Tab 25 MG: ORAL | 30 days supply | Qty: 30 | Fill #1 | Status: AC

## 2020-10-30 MED FILL — Atorvastatin Calcium Tab 40 MG (Base Equivalent): ORAL | 30 days supply | Qty: 30 | Fill #1 | Status: AC

## 2020-10-30 MED FILL — Spironolactone Tab 25 MG: ORAL | 30 days supply | Qty: 30 | Fill #1 | Status: AC

## 2020-10-30 MED FILL — Doxazosin Mesylate Tab 4 MG: ORAL | 30 days supply | Qty: 30 | Fill #1 | Status: AC

## 2020-10-30 MED FILL — Amlodipine Besylate Tab 10 MG (Base Equivalent): ORAL | 30 days supply | Qty: 30 | Fill #1 | Status: AC

## 2020-10-31 ENCOUNTER — Ambulatory Visit: Payer: Self-pay | Admitting: Physician Assistant

## 2020-10-31 ENCOUNTER — Other Ambulatory Visit: Payer: Self-pay

## 2020-10-31 MED ORDER — CARVEDILOL 25 MG PO TABS
ORAL_TABLET | Freq: Two times a day (BID) | ORAL | 3 refills | Status: DC
  Filled 2020-10-31: qty 60, 30d supply, fill #0

## 2020-11-06 ENCOUNTER — Ambulatory Visit: Payer: No Typology Code available for payment source | Admitting: Podiatry

## 2020-11-07 ENCOUNTER — Other Ambulatory Visit: Payer: Self-pay

## 2020-11-07 ENCOUNTER — Ambulatory Visit: Payer: Self-pay | Admitting: *Deleted

## 2020-11-07 ENCOUNTER — Ambulatory Visit: Payer: No Typology Code available for payment source | Admitting: Physician Assistant

## 2020-11-07 VITALS — BP 127/85 | HR 98 | Temp 98.3°F | Resp 18 | Ht 71.0 in | Wt 247.0 lb

## 2020-11-07 DIAGNOSIS — E1169 Type 2 diabetes mellitus with other specified complication: Secondary | ICD-10-CM

## 2020-11-07 DIAGNOSIS — Z6834 Body mass index (BMI) 34.0-34.9, adult: Secondary | ICD-10-CM

## 2020-11-07 DIAGNOSIS — I10 Essential (primary) hypertension: Secondary | ICD-10-CM

## 2020-11-07 DIAGNOSIS — Z794 Long term (current) use of insulin: Secondary | ICD-10-CM

## 2020-11-07 DIAGNOSIS — I110 Hypertensive heart disease with heart failure: Secondary | ICD-10-CM

## 2020-11-07 DIAGNOSIS — R739 Hyperglycemia, unspecified: Secondary | ICD-10-CM

## 2020-11-07 DIAGNOSIS — E6609 Other obesity due to excess calories: Secondary | ICD-10-CM

## 2020-11-07 DIAGNOSIS — R7989 Other specified abnormal findings of blood chemistry: Secondary | ICD-10-CM

## 2020-11-07 LAB — POCT URINALYSIS DIP (CLINITEK)
Bilirubin, UA: NEGATIVE
Blood, UA: NEGATIVE
Glucose, UA: >=1000 mg/dL — AB
Ketones, POC UA: NEGATIVE mg/dL
Leukocytes, UA: NEGATIVE
Nitrite, UA: NEGATIVE
POC PROTEIN,UA: >=300 — AB
Spec Grav, UA: 1.02 (ref 1.010–1.025)
Urobilinogen, UA: 0.2 E.U./dL
pH, UA: 5.5 (ref 5.0–8.0)

## 2020-11-07 LAB — POCT GLYCOSYLATED HEMOGLOBIN (HGB A1C): Hemoglobin A1C: 12.6 % — AB (ref 4.0–5.6)

## 2020-11-07 LAB — GLUCOSE, POCT (MANUAL RESULT ENTRY): POC Glucose: 361 mg/dl — AB (ref 70–99)

## 2020-11-07 MED ORDER — ATORVASTATIN CALCIUM 40 MG PO TABS
ORAL_TABLET | Freq: Every day | ORAL | 1 refills | Status: DC
  Filled 2020-11-07: qty 90, fill #0

## 2020-11-07 MED ORDER — INSULIN ASPART 100 UNIT/ML IJ SOLN: 10.0000 [IU] | Freq: Once | INTRAMUSCULAR | Status: DC

## 2020-11-07 MED ORDER — DOXAZOSIN MESYLATE 4 MG PO TABS
ORAL_TABLET | ORAL | 1 refills | Status: DC
  Filled 2020-11-07: qty 90, fill #0
  Filled 2020-11-14 – 2020-11-16 (×2): qty 30, 30d supply, fill #0
  Filled 2020-12-21: qty 30, 30d supply, fill #1

## 2020-11-07 MED ORDER — HYDRALAZINE HCL 100 MG PO TABS
ORAL_TABLET | ORAL | 1 refills | Status: DC
  Filled 2020-11-07: qty 90, 30d supply, fill #0

## 2020-11-07 MED ORDER — BASAGLAR KWIKPEN 100 UNIT/ML ~~LOC~~ SOPN
20.0000 [IU] | PEN_INJECTOR | Freq: Every day | SUBCUTANEOUS | 3 refills | Status: DC
  Filled 2020-11-07: qty 6, 30d supply, fill #0

## 2020-11-07 MED ORDER — INSULIN PEN NEEDLE 31G X 6 MM MISC
1.0000 | Freq: Two times a day (BID) | 3 refills | Status: DC
  Filled 2020-11-07: qty 100, 50d supply, fill #0

## 2020-11-07 MED ORDER — VICTOZA 18 MG/3ML ~~LOC~~ SOPN
1.8000 mg | PEN_INJECTOR | Freq: Every day | SUBCUTANEOUS | 6 refills | Status: DC
  Filled 2020-11-07: qty 27, 90d supply, fill #0

## 2020-11-07 NOTE — Telephone Encounter (Signed)
Patient's wife is calling to report her husband's glucose is 400. She wants to make an appointment for insulin adjustment- she believes patient is not on high enough dosing.Patient has not taken insulin yesterday or today- reason given- they have had a lot going on in family and have been too busy. Advised needs to be using insulin- they are at appointment now and patient does not have insulin with him- advised UC if they have to disconnect for appointment- call to office to see if there is any availability- on hold over 5 minutes- advised patient UC.

## 2020-11-07 NOTE — Patient Instructions (Signed)
I sent refills of your medications to the pharmacy.  I do encourage you to speak with the pharmacist about correct usage of your insulin pens as well as want some instructional videos.  We will call you with today's lab results.  I do encourage you to continue to check your blood glucose levels at home, keep a written log and have available for all office visits  Please let us know if there is anything else we can do for you.  Kennieth Rad, PA-C Physician Assistant [facility] [email or web address]   Insulin Glargine Injection What is this medication? INSULIN GLARGINE (IN su lin GLAR geen) treats diabetes. It works by increasing insulin levels in your body, which decreases your blood sugar (glucose). It belongs to a group of medications called long-acting insulins or basalinsulins. Changes to diet and exercise are often combined with this medication. This medicine may be used for other purposes; ask your health care provider orpharmacist if you have questions. COMMON BRAND NAME(S): BASAGLAR, Lantus, Lantus SoloStar, Semglee, Toujeo MaxSoloStar, [employer]ocker What should I tell my care team before I take this medication? They need to know if you have any of these conditions: Episodes of low blood sugar Eye disease, vision problems Kidney disease Liver disease An unusual or allergic reaction to insulin, metacresol, other medications, foods, dyes, or preservatives Pregnant or trying to get pregnant Breast-feeding How should I use this medication? This medication is for injection under the skin. Use this medication at the same time each day. Use exactly as directed. This insulin should never be mixed in the same syringe with other insulins before injection. Do not vigorously shake before use. You will be taught how to use this medication and how to adjust doses for activities and illness. Do not use more insulin thanprescribed. Always  check the appearance of your insulin before using it. This medication should be clear and colorless like water. Do not use it if it is cloudy,thickened, colored, or has solid particles in it. If you use an insulin pen, be sure to take off the outer needle cover beforeusing the dose. It is important that you put your used needles and syringes in a special sharps container. Do not put them in a trash can. If you do not have a sharpscontainer, call your pharmacist or care team to get one. This medication comes with INSTRUCTIONS FOR USE. Ask your pharmacist for directions on how to use this medication. Read the information carefully. Talkto your pharmacist or care team if you have questions. Talk to your care team regarding the use of this medication in children. While this medication may be prescribed for children as young as 6 years for selectedconditions, precautions do apply. Overdosage: If you think you have taken too much of this medicine contact apoison control center or emergency room at once. NOTE: This medicine is only for you. Do not share this medicine with others. What if I miss a dose? It is important not to miss a dose. Your care team should discuss a plan for missed doses with you. If you do miss a dose, follow their plan. Do not takedouble doses. What may interact with this medication? Alcohol containing beverages Antiviral medications for HIV or AIDS Aspirin and aspirin-like medications Beta-blockers like atenolol, metoprolol, propranolol Certain medications for blood pressure, heart disease, irregular heart beat Chromium Clonidine Diuretics Male hormones, such as estrogens or progestins, birth control pills Fenofibrate Gemfibrozil Guanethidine Isoniazid Lanreotide Male hormones or anabolic steroids MAOIs like Carbex,  Eldepryl, Marplan, Nardil, and Parnate Medications for weight loss Medications for allergies, asthma, cold, or cough Medications for depression, anxiety, or  psychotic disturbances Niacin Nicotine NSAIDs, medications for pain and inflammation, like ibuprofen or naproxen Octreotide Other medications for diabetes, like glyburide, glipizide, or glimepiride Pasireotide Pentamidine Phenytoin Probenecid Quinolone antibiotics such as ciprofloxacin, levofloxacin, ofloxacin Reserpine Some herbal dietary supplements Steroid medications such as prednisone or cortisone Sulfamethoxazole; trimethoprim Thyroid hormones This list may not describe all possible interactions. Give your health care provider a list of all the medicines, herbs, non-prescription drugs, or dietary supplements you use. Also tell them if you smoke, drink alcohol, or use illegaldrugs. Some items may interact with your medicine. What should I watch for while using this medication? Visit your care team for regular checks on your progress. Do not drive, use machinery, or do anything that needs mental alertness until you know how this medication affects you. Alcohol may interfere with the effectof this medication. Avoid alcoholic drinks. A test called the HbA1C (A1C) will be monitored. This is a simple blood test. It measures your blood sugar control over the last 2 to 3 months. You willreceive this test every 3 to 6 months. Learn how to check your blood sugar. Learn the symptoms of low and high bloodsugar and how to manage them. Always carry a quick-source of sugar with you in case you have symptoms of low blood sugar. Examples include hard sugar candy or glucose tablets. Make sure others know that you can choke if you eat or drink when you develop serious symptoms of low blood sugar, such as seizures or unconsciousness. They must getmedical help at once. Tell your care team if you have high blood sugar. You might need to change the dose of your medication. If you are sick or exercising more than usual, youmight need to change the dose of your medication. Do not skip meals. Ask your care team  if you should avoid alcohol. Many nonprescription cough and cold products contain sugar or alcohol. These canaffect blood sugar. Make sure that you have the right kind of syringe for the type of insulin you use. Try not to change the brand and type of insulin or syringe unless your care team tells you to. Switching insulin brand or type can cause dangerously high or low blood sugar. Always keep an extra supply of insulin, syringes, and needles on hand. Use a syringe one time only. Throw away syringe and needle ina closed container to prevent accidental needle sticks. Insulin pens and cartridges should never be shared. Even if the needle ischanged, sharing may result in passing of viruses like hepatitis or HIV. Each time you get a new box of pen needles, check to see if they are the same type as the ones you were trained to use. If not, ask your care team to showyou how to use this new type properly. Wear a medical ID bracelet or chain, and carry a card that describes yourdisease and details of your medication and dosage times. What side effects may I notice from receiving this medication? Side effects that you should report to your care team as soon as possible: Allergic reactions-skin rash, itching, hives, swelling of the face, lips, tongue, or throat Low blood sugar (hypoglycemia)-tremors or shaking, anxiety, sweating, cold or clammy skin, confusion, dizziness, rapid heartbeat Low potassium level-muscle pain or cramps, unusual weakness or fatigue, fast or irregular heartbeat, constipation Side effects that usually do not require medical attention (report to your careteam if they  continue or are bothersome): Lipodystrophy-hardening or scarring of tissue at injection site Pain, redness, or irritation at injection site Weight gain This list may not describe all possible side effects. Call your doctor for medical advice about side effects. You may report side effects to FDA at1-800-FDA-1088. Where  should I keep my medication? Keep out of the reach of children and pets. Unopened Vials: Lantus vials: Store in a refrigerator between 2 and 8 degrees C (36 and 46 degrees F) or at room temperature below 30 degrees C (86 degrees F). Do not freeze or use if the insulin has been frozen. Protect from light and excessive heat. If stored at room temperature, the vial must be discarded after 28 days. Throw away any unopened and unused medication that has been stored in therefrigerator after the expiration date. Unopened Pens: Neurosurgeon: Store in a refrigerator between 2 and 8 degrees C (36 and 46 degrees F) or at room temperature below 30 degrees C (86 degrees F). Do not freeze or use if the insulin has been frozen. Protect from light and excessive heat. If stored at room temperature, the pen must be discarded after 28 days. Throw away any unopened and unused medication that has been stored in therefrigerator after the expiration date. Lantus Solostar Pens: Store in a refrigerator between 2 and 8 degrees C (36 and 46 degrees F) or at room temperature below 30 degrees C (86 degrees F). Do not freeze or use if the insulin has been frozen. Protect from light and excessive heat. If stored at room temperature, the pen must be discarded after 28 days. Throw away any unopened and unused medication that has been stored in therefrigerator after the expiration date. Semglee Pens: Store in a refrigerator between 2 and 8 degrees C (36 and 46 degrees F) or at room temperature below 30 degrees C (86 degrees F). Do not freeze or use if the insulin has been frozen. Protect from light and excessive heat. If stored at room temperature, the pen must be discarded after 28 days. Throw away any unopened and unused medication that has been stored in therefrigerator after the expiration date. Toujeo Solostar Pens or Toujeo Max [employer] Pens: Store in a refrigerator between 2 and 8 degrees C (36 and 46 degrees F). Do not freeze or  use if the insulin has been frozen. Protect from light and excessive heat. Throw away any unopened and unused medication that has been stored in the refrigerator afterthe expiration date. Vials that you are using: Lantus vials: Store in a refrigerator or at room temperature below 30 degrees C (86 degrees F). Do not freeze. Keep away from heat and light. Throw the openedvial away after 28 days. Semglee vials: Store in a refrigerator or at room temperature below 30 degrees C (86 degrees F). Do not freeze. Keep away from heat and light. Throw theopened vial away after 28 days. Pens that you are using: Basaglar KwikPens: Store at room temperature below 30 degrees C (86 degrees F). Do not refrigerate or freeze. Keep away from heat and light. Throw the pen awayafter 28 days, even if it still has insulin left in it. Lantus Solostar Pens: Store at room temperature below 30 degrees C (86 degrees F). Do not refrigerate or freeze. Keep away from heat and light. Throw the penaway after 28 days, even if it still has insulin left in it. Semglee Pens: Store at room temperature below 30 degrees C (86 degrees F). Do not refrigerate or freeze. Keep  away from heat and light. Throw the pen awayafter 28 days, even if it still has insulin left in it. Toujeo Solostar Pens or Toujeo Max [employer] Pens: Store at room temperature below 30 degrees C (86 degrees F). Do not refrigerate or freeze. Keep away from heat and light. Throw the pen away after 56 days, even if it still has insulinleft in it. NOTE: This sheet is a summary. It may not cover all possible information. If you have questions about this medicine, talk to your doctor, pharmacist, orhealth care provider.  2022 Elsevier/Gold Standard (2020-05-11 11:48:35)

## 2020-11-07 NOTE — Progress Notes (Signed)
Patient reports elevated CBG's since last Tuesday. Patient reports feeling "not like him" described as achy, fatigue, worn out. Patient has taken medication today and patient has eaten a [place] sandwich,. Patient reports CBG's in the 700's sine Tuesday with the levels decreasing in the latter part of the week. This morning patient reports 464 fasting, 444 after eating [place] sandwich for lunch and currently at 361. Patient reports right shoulder pain for the past 2 days feeling worn out.

## 2020-11-07 NOTE — Progress Notes (Signed)
Established Patient Office Visit  Subjective:  Patient ID: Matthew Odom, male    DOB: [birth year]  Age: 50 y.o. MRN: [id number]  CC:  Chief Complaint  Patient presents with   Diabetes    HPI NAM VOSSLER reports that he has been having elevated blood glucose readings.  States that he has been having readings in the 700s since last week.  He reports that this morning his fasting blood glucose level was 464.  He reports that he has been feeling fatigued, achy and "worn out".  States that he has been using 4 units of the Basaglar and 1.8 units of the Victoza on a daily basis.     Past Medical History:  Diagnosis Date   Cocaine use    Diabetes mellitus without complication (HCC)    Enlarged heart    History of degenerative disc disease    Hyperlipidemia    Hypertension    NSTEMI (non-ST elevated myocardial infarction) ([place])    Sciatic nerve pain     Past Surgical History:  Procedure Laterality Date   CARDIAC CATHETERIZATION     NO PAST SURGERIES      Family History  Problem Relation Age of Onset   Heart attack Mother    Hypertension Mother    Diabetes Mother    Heart disease Sister    Hypertension Sister    Hypertension Father    Emphysema Father     Social History   Socioeconomic History   Marital status: Married    Spouse name: Not on file   Number of children: Not on file   Years of education: Not on file   Highest education level: Not on file  Occupational History   Not on file  Tobacco Use   Smoking status: Every Day    Packs/day: 1.00    Years: 40.00    Pack years: 40.00    Types: Cigarettes   Smokeless tobacco: Never  Substance and Sexual Activity   Alcohol use: Yes    Comment: occasionally   Drug use: Never   Sexual activity: Yes  Other Topics Concern   Not on file  Social History Narrative   Not on file   Social Determinants of Health   Financial Resource Strain: Not on file  Food Insecurity: Not on file  Transportation Needs: Not  on file  Physical Activity: Not on file  Stress: Not on file  Social Connections: Not on file  Intimate Partner Violence: Not on file    Outpatient Medications Prior to Visit  Medication Sig Dispense Refill   carvedilol (COREG) 25 MG tablet TAKE 1 TABLET (25 MG TOTAL) BY MOUTH 2 (TWO) TIMES DAILY WITH A MEAL. 180 tablet 3   albuterol (VENTOLIN HFA) 108 (90 Base) MCG/ACT inhaler Inhale 2 puffs into the lungs every 6 (six) hours as needed for wheezing or shortness of breath (or cough). ONLY AS NEEDED. Not meant for every day use 18 g 1   ALPRAZolam (XANAX) 0.25 MG tablet Take by mouth.     amLODipine (NORVASC) 10 MG tablet TAKE 1 TABLET (10 MG TOTAL) BY MOUTH DAILY. 90 tablet 3   budesonide-formoterol (SYMBICORT) 160-4.5 MCG/ACT inhaler Inhale 2 puffs into the lungs 2 (two) times daily. 1 Inhaler 3   chlorhexidine (PERIDEX) 0.12 % solution USE AS DIRECTED 15 MLS IN THE MOUTH OR THROAT 2 (TWO) TIMES DAILY. 473 mL 0   ciclopirox (PENLAC) 8 % solution APPLY TOPICALLY AT BEDTIME. APPLY TO AFFECTED TOENAILS ONCE  DAILY FOR 48 WEEKS. REMOVE ONCE WEEKLY WITH NAIL POLISH REMOVER. 6.6 mL 11   hydrOXYzine (ATARAX/VISTARIL) 25 MG tablet Take 1 tablet (25 mg total) by mouth 3 (three) times daily as needed. 60 tablet 0   isosorbide mononitrate (IMDUR) 30 MG 24 hr tablet Take by mouth.     losartan (COZAAR) 25 MG tablet TAKE 1 TABLET BY MOUTH DAILY 90 tablet 3   Misc. Devices MISC Please provide patient with insurance approved CPAP with the following settings:  autopap 15-20.  Large size [employer] Full Face Mask Forma mask and heated humidification. (Patient taking differently: Please provide patient with insurance approved CPAP with the following settings:  autopap 15-20.  Large size [employer] Full Face Mask Forma mask and heated humidification.) 1 each 0   nitroGLYCERIN (NITROSTAT) 0.4 MG SL tablet Place 1 tablet (0.4 mg total) under the tongue every 5 (five) minutes as needed for chest pain. 25  tablet 0   penicillin v potassium (VEETID) 500 MG tablet TAKE 1 TABLET (500 MG TOTAL) BY MOUTH 4 (FOUR) TIMES DAILY FOR 7 DAYS. 28 tablet 0   spironolactone (ALDACTONE) 50 MG tablet TAKE 1 TABLET (50 MG TOTAL) BY MOUTH DAILY. 30 tablet 1   tamsulosin (FLOMAX) 0.4 MG CAPS capsule Take 1 capsule by mouth daily 90 capsule 3   amLODipine (NORVASC) 10 MG tablet TAKE 1 TABLET (10 MG TOTAL) BY MOUTH DAILY. 90 tablet 3   atorvastatin (LIPITOR) 40 MG tablet TAKE 1 TABLET (40 MG TOTAL) BY MOUTH DAILY. 90 tablet 1   doxazosin (CARDURA) 4 MG tablet TAKE 1 TABLET (4 MG TOTAL) BY MOUTH AT BEDTIME. 90 tablet 1   hydrALAZINE (APRESOLINE) 100 MG tablet HOLD for 24 hours then resume 156m three times daily 90 tablet 1   Insulin Glargine (BASAGLAR KWIKPEN) 100 UNIT/ML Inject 20 Units into the skin daily. NEEDS DOH 6 mL 3   Insulin Pen Needle 31G X 6 MM MISC 1 Device by Does not apply route 2 (two) times daily. 100 each 3   liraglutide (VICTOZA) 18 MG/3ML SOPN Inject 1.8 mg into the skin daily. NEEDS PASS 9 mL 6   spironolactone (ALDACTONE) 25 MG tablet TAKE 1 TABLET BY MOUTH DAILY 90 tablet 3   tamsulosin (FLOMAX) 0.4 MG CAPS capsule TAKE 1 CAPSULE BY MOUTH ONCE A DAY 30 capsule 12   No facility-administered medications prior to visit.    Allergies  Allergen Reactions   Aspirin Hives   Nitroglycerin Other (See Comments)    Nitroglycerin paste will make blood pressure go up and cause headaches. The nitroglycerin tablets do fine.    ROS Review of Systems  Constitutional:  Positive for fatigue. Negative for chills and fever.  HENT: Negative.    Eyes:  Positive for visual disturbance.  Respiratory:  Negative for shortness of breath.   Cardiovascular:  Negative for chest pain.  Gastrointestinal: Negative.   Endocrine: Negative.   Genitourinary: Negative.   Musculoskeletal: Negative.   Skin: Negative.   Allergic/Immunologic: Negative.   Neurological: Negative.   Hematological: Negative.    Psychiatric/Behavioral: Negative.       Objective:    Physical Exam Vitals and nursing note reviewed.  Constitutional:      Appearance: Normal appearance. He is obese.  HENT:     Head: Normocephalic and atraumatic.     Right Ear: External ear normal.     Left Ear: External ear normal.     Nose: Nose normal.     Mouth/Throat:  Mouth: Mucous membranes are moist.     Pharynx: Oropharynx is clear.  Eyes:     Extraocular Movements: Extraocular movements intact.     Conjunctiva/sclera: Conjunctivae normal.     Pupils: Pupils are equal, round, and reactive to light.  Cardiovascular:     Rate and Rhythm: Normal rate and regular rhythm.     Pulses: Normal pulses.     Heart sounds: Normal heart sounds.  Pulmonary:     Effort: Pulmonary effort is normal.     Breath sounds: Normal breath sounds.  Musculoskeletal:        General: Normal range of motion.     Cervical back: Normal range of motion and neck supple.  Skin:    General: Skin is warm and dry.  Neurological:     General: No focal deficit present.     Mental Status: He is alert and oriented to person, place, and time.  Psychiatric:        Mood and Affect: Mood normal.        Behavior: Behavior normal.        Thought Content: Thought content normal.        Judgment: Judgment normal.    BP 127/85 (BP Location: Right Arm, Patient Position: Sitting, Cuff Size: Large)   Pulse 98   Temp 98.3 F (36.8 C) (Oral)   Resp 18   Ht '5\' 11"'  (1.803 m)   Wt 247 lb (112 kg)   SpO2 98%   BMI 34.45 kg/m  Wt Readings from Last 3 Encounters:  11/07/20 247 lb (112 kg)  04/24/20 255 lb (115.7 kg)  03/13/20 248 lb (112.5 kg)     Health Maintenance Due  Topic Date Due   PNEUMOCOCCAL POLYSACCHARIDE VACCINE AGE 56-64 HIGH RISK  Never done   Pneumococcal Vaccine 68-53 Years old (1 - PCV) Never done   OPHTHALMOLOGY EXAM  Never done   TETANUS/TDAP  Never done   COLONOSCOPY (Pts 45-21yr Insurance coverage will need to be confirmed)   Never done   COVID-19 Vaccine (3 - Booster for [place]) 06/15/2020    There are no preventive care reminders to display for this patient.  Lab Results  Component Value Date   TSH 2.140 01/31/2020   Lab Results  Component Value Date   WBC 6.7 11/07/2020   HGB 13.6 11/07/2020   HCT 41.8 11/07/2020   MCV 84 11/07/2020   PLT 292 11/07/2020   Lab Results  Component Value Date   NA 136 11/07/2020   K 5.2 11/07/2020   CO2 23 04/24/2020   GLUCOSE 305 (H) 11/07/2020   BUN 47 (H) 11/07/2020   CREATININE 3.25 (H) 11/07/2020   BILITOT 0.3 11/07/2020   ALKPHOS 137 (H) 11/07/2020   AST 9 11/07/2020   ALT 16 04/24/2020   PROT 6.4 11/07/2020   ALBUMIN 4.1 11/07/2020   CALCIUM 9.2 11/07/2020   ANIONGAP 13 06/29/2018   EGFR 22 (L) 11/07/2020   Lab Results  Component Value Date   CHOL 181 04/24/2020   Lab Results  Component Value Date   HDL 39 (L) 04/24/2020   Lab Results  Component Value Date   LDLCALC 106 (H) 04/24/2020   Lab Results  Component Value Date   TRIG 210 (H) 04/24/2020   Lab Results  Component Value Date   CHOLHDL 4.6 04/24/2020   Lab Results  Component Value Date   HGBA1C 12.6 (A) 11/07/2020      Assessment & Plan:   Problem List  Items Addressed This Visit       Cardiovascular and Mediastinum   Essential hypertension   Relevant Medications   hydrALAZINE (APRESOLINE) 100 MG tablet   doxazosin (CARDURA) 4 MG tablet   atorvastatin (LIPITOR) 40 MG tablet   Other Relevant Orders   CBC with Differential/Platelet (Completed)   Comp. Metabolic Panel (12) (Completed)   LVH (left ventricular hypertrophy) due to hypertensive disease   Relevant Medications   hydrALAZINE (APRESOLINE) 100 MG tablet   doxazosin (CARDURA) 4 MG tablet   atorvastatin (LIPITOR) 40 MG tablet     Endocrine   Type 2 diabetes mellitus, with long-term current use of insulin (HCC) - Primary   Relevant Medications   Insulin Pen Needle 31G X 6 MM MISC   Insulin Glargine  (BASAGLAR KWIKPEN) 100 UNIT/ML   atorvastatin (LIPITOR) 40 MG tablet   liraglutide (VICTOZA) 18 MG/3ML SOPN   insulin aspart (novoLOG) injection 10 Units   Other Relevant Orders   HgB A1c (Completed)   Glucose (CBG) (Completed)   POCT URINALYSIS DIP (CLINITEK) (Completed)     Other   Hyperglycemia    Meds ordered this encounter  Medications   Insulin Pen Needle 31G X 6 MM MISC    Sig: Use as directed 2 (two) times daily.    Dispense:  100 each    Refill:  3    Order Specific Question:   Supervising Provider    Answer:   Elsie Stain [[id number]]   Insulin Glargine (BASAGLAR KWIKPEN) 100 UNIT/ML    Sig: Inject 20 Units into the skin daily.    Dispense:  6 mL    Refill:  3    Order Specific Question:   Supervising Provider    Answer:   Elsie Stain [[id number]]   hydrALAZINE (APRESOLINE) 100 MG tablet    Sig: Take 1 tablet by mouth three times daily    Dispense:  90 tablet    Refill:  1    Order Specific Question:   Supervising Provider    Answer:   Asencion Noble E [[id number]]   doxazosin (CARDURA) 4 MG tablet    Sig: TAKE 1 TABLET (4 MG TOTAL) BY MOUTH AT BEDTIME.    Dispense:  90 tablet    Refill:  1    Order Specific Question:   Supervising Provider    Answer:   Asencion Noble E [[id number]]   atorvastatin (LIPITOR) 40 MG tablet    Sig: TAKE 1 TABLET (40 MG TOTAL) BY MOUTH DAILY.    Dispense:  90 tablet    Refill:  1    Order Specific Question:   Supervising Provider    Answer:   Joya Gaskins, PATRICK E [[id number]]   liraglutide (VICTOZA) 18 MG/3ML SOPN    Sig: Inject 1.8 mg into the skin daily. NEEDS PASS    Dispense:  9 mL    Refill:  6    Order Specific Question:   Supervising Provider    Answer:   Asencion Noble E [[id number]]   insulin aspart (novoLOG) injection 10 Units   1. Type 2 diabetes mellitus with other specified complication, with long-term current use of insulin (HCC) UA was negative for ketones, patient was given 10 units in clinic.  Patient education given on correct  dosing of Basaglar.  Patient understands and agrees.  Patient should have been using 20 units versus 4.  Red flags given for prompt reevaluation.   - HgB A1c - Glucose (CBG) - Insulin Pen Needle 31G  X 6 MM MISC; Use as directed 2 (two) times daily.  Dispense: 100 each; Refill: 3 - Insulin Glargine (BASAGLAR KWIKPEN) 100 UNIT/ML; Inject 20 Units into the skin daily.  Dispense: 6 mL; Refill: 3 - atorvastatin (LIPITOR) 40 MG tablet; TAKE 1 TABLET (40 MG TOTAL) BY MOUTH DAILY.  Dispense: 90 tablet; Refill: 1 - liraglutide (VICTOZA) 18 MG/3ML SOPN; Inject 1.8 mg into the skin daily. NEEDS PASS  Dispense: 9 mL; Refill: 6 - insulin aspart (novoLOG) injection 10 Units - POCT URINALYSIS DIP (CLINITEK)  2. Hyperglycemia   3. Essential hypertension Continue current regimen - hydrALAZINE (APRESOLINE) 100 MG tablet; Take 1 tablet by mouth three times daily  Dispense: 90 tablet; Refill: 1 - CBC with Differential/Platelet - Comp. Metabolic Panel (12)  4. 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  Dispense: 90 tablet; Refill: 1   I have reviewed the patient's medical history (PMH, PSH, Social History, Family History, Medications, and allergies) , and have been updated if relevant. I spent 42 minutes reviewing chart and  face to face time with patient.   Follow-up: Return in about 1 month (around 12/08/2020) for [facility].    Loraine Grip Mayers, PA-C

## 2020-11-07 NOTE — Telephone Encounter (Signed)
Reason for Disposition . Blood glucose > 400 mg/dL (22.2 mmol/L)  Answer Assessment - Initial Assessment Questions 1. BLOOD GLUCOSE: "What is your blood glucose level?"      464- fasting, 446 2. ONSET: "When did you check the blood glucose?"     10:00, 10:15 3. USUAL RANGE: "What is your glucose level usually?" (e.g., usual fasting morning value, usual evening value)     160-200- after meals- has been high the last week- 260's 4. KETONES: "Do you check for ketones (urine or blood test strips)?" If yes, ask: "What does the test show now?"      N/a 5. TYPE 1 or 2:  "Do you know what type of diabetes you have?"  (e.g., Type 1, Type 2, Gestational; doesn't know)      Type 2 6. INSULIN: "Do you take insulin?" "What type of insulin(s) do you use? What is the mode of delivery? (syringe, pen; injection or pump)?"      Yes- pen-2 different kinds- not sure the names-no dosing today- no dosing yesterday 7. DIABETES PILLS: "Do you take any pills for your diabetes?" If yes, ask: "Have you missed taking any pills recently?"     no 8. OTHER SYMPTOMS: "Do you have any symptoms?" (e.g., fever, frequent urination, difficulty breathing, dizziness, weakness, vomiting)     Frequency, not feeling well- fatigued 9. PREGNANCY: "Is there any chance you are pregnant?" "When was your last menstrual period?"     N/a  Protocols used: Diabetes - High Blood Sugar-A-AH

## 2020-11-08 ENCOUNTER — Encounter: Payer: Self-pay | Admitting: Physician Assistant

## 2020-11-08 ENCOUNTER — Other Ambulatory Visit: Payer: Self-pay

## 2020-11-08 DIAGNOSIS — R739 Hyperglycemia, unspecified: Secondary | ICD-10-CM | POA: Insufficient documentation

## 2020-11-08 LAB — CBC WITH DIFFERENTIAL/PLATELET
Basophils Absolute: 0 10*3/uL (ref 0.0–0.2)
Basos: 1 %
EOS (ABSOLUTE): 0.1 10*3/uL (ref 0.0–0.4)
Eos: 1 %
Hematocrit: 41.8 % (ref 37.5–51.0)
Hemoglobin: 13.6 g/dL (ref 13.0–17.7)
Immature Grans (Abs): 0 10*3/uL (ref 0.0–0.1)
Immature Granulocytes: 0 %
Lymphocytes Absolute: 2 10*3/uL (ref 0.7–3.1)
Lymphs: 30 %
MCH: 27.2 pg (ref 26.6–33.0)
MCHC: 32.5 g/dL (ref 31.5–35.7)
MCV: 84 fL (ref 79–97)
Monocytes Absolute: 0.4 10*3/uL (ref 0.1–0.9)
Monocytes: 6 %
Neutrophils Absolute: 4.2 10*3/uL (ref 1.4–7.0)
Neutrophils: 62 %
Platelets: 292 10*3/uL (ref 150–450)
RBC: 5 x10E6/uL (ref 4.14–5.80)
RDW: 13.8 % (ref 11.6–15.4)
WBC: 6.7 10*3/uL (ref 3.4–10.8)

## 2020-11-08 LAB — COMP. METABOLIC PANEL (12)
AST: 9 IU/L (ref 0–40)
Albumin/Globulin Ratio: 1.8 (ref 1.2–2.2)
Albumin: 4.1 g/dL (ref 4.0–5.0)
Alkaline Phosphatase: 137 IU/L — ABNORMAL HIGH (ref 44–121)
BUN/Creatinine Ratio: 14 (ref 9–20)
BUN: 47 mg/dL — ABNORMAL HIGH (ref 6–24)
Bilirubin Total: 0.3 mg/dL (ref 0.0–1.2)
Calcium: 9.2 mg/dL (ref 8.7–10.2)
Chloride: 101 mmol/L (ref 96–106)
Creatinine, Ser: 3.25 mg/dL — ABNORMAL HIGH (ref 0.76–1.27)
Globulin, Total: 2.3 g/dL (ref 1.5–4.5)
Glucose: 305 mg/dL — ABNORMAL HIGH (ref 65–99)
Potassium: 5.2 mmol/L (ref 3.5–5.2)
Sodium: 136 mmol/L (ref 134–144)
Total Protein: 6.4 g/dL (ref 6.0–8.5)
eGFR: 22 mL/min/{1.73_m2} — ABNORMAL LOW (ref >59)

## 2020-11-08 NOTE — Addendum Note (Signed)
Addended by: Kennieth Rad on: 11/08/2020 06:08 PM   Modules accepted: Orders

## 2020-11-12 ENCOUNTER — Telehealth: Payer: Self-pay | Admitting: *Deleted

## 2020-11-12 NOTE — Telephone Encounter (Signed)
-----   Message from Kennieth Rad, [place] sent at 11/08/2020  6:08 PM EDT ----- Please call patient and have him present to the mobile unit for repeat labs, his kidney function has decreased, this is more than likely due to his elevated blood glucose readings, but it does need to be repeated at this time.  Please encourage him to increase his hydration.

## 2020-11-12 NOTE — Telephone Encounter (Signed)
Patient verified DOB Patient is aware of needing to repeat CMP to check kidney function. Patient requested Friday at 9:30 and has been placed on the schedule.

## 2020-11-14 ENCOUNTER — Other Ambulatory Visit: Payer: Self-pay

## 2020-11-15 ENCOUNTER — Other Ambulatory Visit: Payer: Self-pay

## 2020-11-16 ENCOUNTER — Other Ambulatory Visit: Payer: No Typology Code available for payment source

## 2020-11-16 ENCOUNTER — Other Ambulatory Visit: Payer: Self-pay

## 2020-11-16 DIAGNOSIS — N1831 Chronic kidney disease, stage 3a: Secondary | ICD-10-CM

## 2020-11-16 DIAGNOSIS — N289 Disorder of kidney and ureter, unspecified: Secondary | ICD-10-CM

## 2020-11-16 NOTE — Progress Notes (Signed)
CMP completed

## 2020-11-17 LAB — COMPREHENSIVE METABOLIC PANEL
ALT: 16 IU/L (ref 0–44)
AST: 11 IU/L (ref 0–40)
Albumin/Globulin Ratio: 1.7 (ref 1.2–2.2)
Albumin: 4.1 g/dL (ref 4.0–5.0)
Alkaline Phosphatase: 105 IU/L (ref 44–121)
BUN/Creatinine Ratio: 13 (ref 9–20)
BUN: 37 mg/dL — ABNORMAL HIGH (ref 6–24)
Bilirubin Total: 0.4 mg/dL (ref 0.0–1.2)
CO2: 17 mmol/L — ABNORMAL LOW (ref 20–29)
Calcium: 8.1 mg/dL — ABNORMAL LOW (ref 8.7–10.2)
Chloride: 106 mmol/L (ref 96–106)
Creatinine, Ser: 2.8 mg/dL — ABNORMAL HIGH (ref 0.76–1.27)
Globulin, Total: 2.4 g/dL (ref 1.5–4.5)
Glucose: 110 mg/dL — ABNORMAL HIGH (ref 65–99)
Potassium: 4.5 mmol/L (ref 3.5–5.2)
Sodium: 140 mmol/L (ref 134–144)
Total Protein: 6.5 g/dL (ref 6.0–8.5)
eGFR: 27 mL/min/{1.73_m2} — ABNORMAL LOW (ref >59)

## 2020-11-20 ENCOUNTER — Other Ambulatory Visit: Payer: Self-pay

## 2020-11-20 ENCOUNTER — Ambulatory Visit (INDEPENDENT_AMBULATORY_CARE_PROVIDER_SITE_OTHER): Payer: Self-pay | Admitting: Family

## 2020-11-20 VITALS — BP 159/97 | HR 96 | Temp 98.1°F | Resp 18 | Ht 70.98 in | Wt 250.6 lb

## 2020-11-20 DIAGNOSIS — N184 Chronic kidney disease, stage 4 (severe): Secondary | ICD-10-CM

## 2020-11-20 DIAGNOSIS — F411 Generalized anxiety disorder: Secondary | ICD-10-CM

## 2020-11-20 DIAGNOSIS — I5042 Chronic combined systolic (congestive) and diastolic (congestive) heart failure: Secondary | ICD-10-CM

## 2020-11-20 DIAGNOSIS — I10 Essential (primary) hypertension: Secondary | ICD-10-CM

## 2020-11-20 DIAGNOSIS — E1165 Type 2 diabetes mellitus with hyperglycemia: Secondary | ICD-10-CM

## 2020-11-20 DIAGNOSIS — G4733 Obstructive sleep apnea (adult) (pediatric): Secondary | ICD-10-CM

## 2020-11-20 DIAGNOSIS — Z794 Long term (current) use of insulin: Secondary | ICD-10-CM

## 2020-11-20 DIAGNOSIS — Z7689 Persons encountering health services in other specified circumstances: Secondary | ICD-10-CM

## 2020-11-20 NOTE — Patient Instructions (Signed)
Thank you for choosing Primary Care at [facility] for your medical home!    Matthew Odom was seen by Camillia Herter, NP today.   Louie Boston primary care provider is Tashanti Dalporto Zachery Dauer, NP.   For the best care possible,  you should try to see Durene Fruits, NP whenever you come to clinic.   We look forward to seeing you again soon!  If you have any questions about your visit today,  please call us at [phone]  Or feel free to reach your provider via [place].    Keeping you healthy   Get these tests Blood pressure- Have your blood pressure checked once a year by your healthcare provider.  Normal blood pressure is 120/80. Weight- Have your body mass index (BMI) calculated to screen for obesity.  BMI is a measure of body fat based on height and weight. You can also calculate your own BMI at [employer].it. Cholesterol- Have your cholesterol checked regularly starting at age 41, sooner may be necessary if you have diabetes, high blood pressure, if a family member developed heart diseases at an early age or if you smoke.  Chlamydia, HIV, and other sexual transmitted disease- Get screened each year until the age of 71 then within three months of each new sexual partner. Diabetes- Have your blood sugar checked regularly if you have high blood pressure, high cholesterol, a family history of diabetes or if you are overweight.   Get these vaccines Flu shot- Every fall. Tetanus shot- Every 10 years. Menactra- Single dose; prevents meningitis.   Take these steps Don't smoke- If you do smoke, ask your healthcare provider about quitting. For tips on how to quit, go to www.smokefree.gov or call 1-800-QUIT-NOW. Be physically active- Exercise 5 days a week for at least 30 minutes.  If you are not already physically active start slow and gradually work up to 30 minutes of moderate physical activity.  Examples of moderate activity include walking briskly, mowing the yard, dancing,  swimming bicycling, etc. Eat a healthy diet- Eat a variety of healthy foods such as fruits, vegetables, low fat milk, low fat cheese, yogurt, lean meats, poultry, fish, beans, tofu, etc.  For more information on healthy eating, go to www.thenutritionsource.org Drink alcohol in moderation- Limit alcohol intake two drinks or less a day.  Never drink and drive. Dentist- Brush and floss teeth twice daily; visit your dentis twice a year. Depression-Your emotional health is as important as your physical health.  If you're feeling down, losing interest in things you normally enjoy please talk with your healthcare provider. Gun Safety- If you keep a gun in your home, keep it unloaded and with the safety lock on.  Bullets should be stored separately. Helmet use- Always wear a helmet when riding a motorcycle, bicycle, rollerblading or skateboarding. Safe sex- If you may be exposed to a sexually transmitted infection, use a condom Seat belts- Seat bels can save your life; always wear one. Smoke/Carbon Monoxide detectors- These detectors need to be installed on the appropriate level of your home.  Replace batteries at least once a year. Skin Cancer- When out in the sun, cover up and use sunscreen SPF 15 or higher. Violence- If anyone is threatening or hurting you, please tell your healthcare provider.

## 2020-11-20 NOTE — Progress Notes (Signed)
Pt presents to establish care transferring from [facility].Marland Kitchen Req refill on methocarbamol for back pain and for his anxiety Alprazolam

## 2020-11-20 NOTE — Progress Notes (Signed)
Subjective:    Matthew Odom - 50 y.o. male MRN [id number]  Date of birth: 1971-01-13  HPI  Matthew Odom is to establish care. 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 Patient is accompanied by his wife who serves as primary historian.  Current issues and/or concerns: DIABETES TYPE 2 FOLLOW-UP: Visit 11/07/2020 at [facility] per PA note: UA was negative for ketones, patient was given 10 units in clinic.  Patient education given on correct dosing of Basaglar.  Patient understands and agrees.  Patient should have been using 20 units versus 4.  Red flags given for prompt reevaluation.   11/20/2020: Still taking 10 units of Insulin Basaglar because felt that 20 units is too much. Concern for low blood sugars if taking 20 units. Reports if blood sugars are around 112 that's too low for him and he doesn't feel well. Home blood sugars usually in the 200's or higher. Still taking Victoza. Was taking Metformin in the past and discontinued because it was affecting his kidneys, he has CKD.  Diet Adherence: '[x]'$  Yes    '[]'$  No Exercise: '[x]'$  Yes    '[]'$  No  2. HYPERTENSION FOLLOW-UP: Visit 11/07/2020 at [facility] per PA note: Continue current regimen. Hydralazine, Doxazosin, and Atorvastatin for hypertension and heart failure.   11/20/2020: Was seeing Cardiology for management. He also has history of heart failure.   3. ANXIETY:  Current regimen is Hydroxyzine prescribed by previous primary provider. Patient requesting refills on Xanax stating that it is the only medication that really helps with his anxiety. Reports does not take it everyday. Worsening anxiety and panic attacks during the night, even awakening  his wife. Also, patient has sleep apnea which wife feels may be worsening anxiety attacks. He had a sleep study and a CPAP was recommended. However, they have been unable to get a CPAP machine for the patient related to financial concerns, requesting assistance if possible. Wife describes patient jumping from the bed quickly during the night when anxiety is at a high level.   Depression screen [facility] 2/9 11/20/2020 04/24/2020 01/20/2020 11/28/2019 11/08/2019  Decreased Interest 1 2 0 1 1  Down, Depressed, Hopeless 0 1 0 0 0  PHQ - 2 Score 1 3 0 1 1  Altered sleeping '2 3 3 1 3  '$ Tired, decreased energy '1 3 1 1 2  '$ Change in appetite '1 1 1 1 2  '$ Feeling bad or failure about yourself  0 0 0 1 0  Trouble concentrating 0 0 0 0 0  Moving slowly or fidgety/restless 0 0 0 0 1  Suicidal thoughts 0 0 0 0 0  PHQ-9 Score '5 10 5 5 9  '$ Difficult doing work/chores Not difficult at all - - - -     ROS per HPI    Health Maintenance:  Health Maintenance Due  Topic Date Due   PNEUMOCOCCAL POLYSACCHARIDE VACCINE AGE 49-64 HIGH RISK  Never done   Pneumococcal Vaccine 46-58 Years old (1 - PCV) Never done   OPHTHALMOLOGY EXAM  Never done   TETANUS/TDAP  Never done   COLONOSCOPY (Pts 45-25yr Insurance coverage will need to be confirmed)  Never done   COVID-19 Vaccine (3 - Booster for [employer]) 06/15/2020    Past Medical History:  Patient Active Problem List   Diagnosis Date Noted   Hyperglycemia 11/08/2020   Orthostatic hypotension 11/28/2019   Type 2 diabetes mellitus, with long-term current use of insulin ([place]) 09/27/2018   Chest pain 06/29/2018   Degenerative spondylolisthesis 04/04/2018   Lumbar radiculopathy 04/04/2018   Spinal stenosis of lumbar region with neurogenic claudication 04/04/2018   Synovial cyst of lumbar facet joint 04/04/2018   CKD (chronic kidney disease), stage III ([place]) 01/03/2018   Essential hypertension 01/02/2018   Hyperlipemia 01/02/2018   Abnormal EKG 01/02/2018   COPD with  acute bronchitis ([place]) 01/02/2018   Nicotine abuse 01/02/2018   Chronic combined systolic and diastolic CHF, NYHA class 3 ([place]) 02/15/2016   LVH (left ventricular hypertrophy) due to hypertensive disease 02/15/2016   Nonischemic cardiomyopathy ([place]) 03/23/2015    Social History   reports that he has been smoking cigarettes. He has a 40.00 pack-year smoking history. He has never used smokeless tobacco. He reports current alcohol use. He reports that he does not use drugs.   Family History  family history includes Diabetes in his mother; Emphysema in his father; Heart attack in his mother; Heart disease in his sister; Hypertension in his father, mother, and sister.   Medications: reviewed and updated   Objective:   Physical Exam BP (!) 159/97 (BP Location: Left Arm, Patient Position: Sitting, Cuff Size: Normal)   Pulse 96   Temp 98.1 F (36.7 C)   Resp 18   Ht 5' 10.98" (1.803 m)   Wt 250 lb 9.6 oz (113.7 kg)   SpO2 96%   BMI 34.97 kg/m   Physical Exam HENT:     Head: Normocephalic and atraumatic.  Eyes:     Extraocular Movements: Extraocular movements intact.     Conjunctiva/sclera: Conjunctivae normal.     Pupils: Pupils are equal, round, and reactive to light.  Cardiovascular:     Rate and Rhythm: Normal rate and regular rhythm.     Pulses: Normal pulses.     Heart sounds: Normal heart sounds.  Pulmonary:     Effort: Pulmonary effort is normal.     Breath sounds: Normal breath sounds.  Musculoskeletal:     Cervical back: Normal range of motion and neck supple.  Neurological:     General: No focal deficit present.     Mental Status: He is alert and oriented to person, place, and time.  Psychiatric:        Mood and Affect: Mood normal.        Behavior: Behavior normal.       Assessment & Plan:  1. Encounter to establish care: - Patient presents today to establish care.  - Return for annual physical examination, labs, and health maintenance. Arrive fasting  meaning having no food for at least 8 hours prior to appointment. You may have only water or black coffee. Please take scheduled medications as normal.  2. Essential hypertension: 3. Chronic combined systolic and diastolic CHF, NYHA class 3 ([place]): - Blood pressure not at goal during today's visit. Patient asymptomatic without chest pressure, chest pain, palpitations, shortness of breath, and worst headache of life. - Continue current regimen Amlodipine, Carvedilol, Doxazosin, Hydralazine, Losartan, and Spironolactone as prescribed.  - Patient previously managed by Cardiology and was not medically released. Referral back to Cardiology for further evaluation and management.  - Follow-up with primary provider as scheduled.  - Ambulatory referral to Cardiology - amLODipine (NORVASC) 10 MG tablet; Take 1 tablet (10 mg total) by mouth daily.  Dispense: 90 tablet; Refill: 0  4. Type 2 diabetes mellitus with hyperglycemia, with long-term current use of insulin ([place]):  - Hemoglobin not at goal at 12.6% on 11/07/2020, goal < 7%. This is increased from previous hemoglobin A1c of 7.0% on 04/24/2020. Next hemoglobin A1c due October 2022.  - Patient was taking Insulin Glargine 10 units daily instead of prescribed 20 units daily with concerns of hypoglycemia. Counseled patient to resume Insulin Glargine 20 units daily as prescribed and to consistently check blood sugars.  - Counseled patient that he can take Insulin Glargine 10 units in the morning and 10 units at bedtime to see if this helps decrease events of hypoglycemia. Counseled to let me know if he experiences hypoglycemia, any other side effects, and to report to the Emergency Department if needed for immediate evaluation. Patient verbalized understanding.  - Continue Liraglutide as prescribed.  - Patient has CKD and intolerant to Metformin for this reason.  - Discussed the importance of healthy eating habits, low-carbohydrate diet, low-sugar diet, regular  aerobic exercise (at least 150 minutes a week as tolerated) and medication compliance to achieve or maintain control of diabetes. - To achieve an A1C goal of less than or equal to 7.0 percent, a fasting blood sugar of 80 to 130 mg/dL and a postprandial glucose (90 to 120 minutes after a meal) less than 180 mg/dL. In the event of sugars less than 60 mg/dl or greater than 400 mg/dl please notify the clinic ASAP. It is recommended that you undergo annual eye exams and annual foot exams. - Follow-up with primary provider in 4 weeks or sooner if needed.   5. Stage 4 chronic kidney disease ([place]): - Referral to Nephrology for further evaluation and management.  - Ambulatory referral to Nephrology  6. Generalized anxiety disorder: - Patient denies thoughts of self-harm, suicidal ideations, and homicidal ideations.  - I sent the patient a message via [place] on 11/21/2020 to notify him that I will be unable to prescribe Alprazolam as this was not prescribed by previous primary care provider and that it has been over 1 year since he was prescribed the same. Also, counseled primary care does not long-term manage benzodiazepines such as Alprazolam.  - Continue Hydroxyzine for now. - Urgent referral to Psychiatry for further evaluation and management.  - Follow-up with primary provider as scheduled.  - Ambulatory referral to Psychiatry  7. Obstructive sleep apnea: - Last sleep study 12/22/2019. Patient's wife reports a CPAP was recommended at that time. They have been unable to get patient a CPAP related to financial concerns. I have reached out to case manager Eden Lathe, RN at [facility] and [facility] to see if we have financial resources available to the patient to assist.     Patient was given clear instructions to go to Emergency Department or return to medical center if symptoms don't improve, worsen, or new problems develop.The patient verbalized understanding.  I discussed  the assessment and treatment plan with the patient. The patient was provided an opportunity to ask questions and all were answered. The patient agreed with the plan and demonstrated an understanding of the instructions.   The patient was advised to call back or seek an in-person evaluation if the symptoms worsen or if the condition fails to improve as anticipated.    Durene Fruits, NP 11/23/2020, 9:01 AM Primary Care at [facility]

## 2020-11-21 ENCOUNTER — Other Ambulatory Visit: Payer: Self-pay

## 2020-11-21 ENCOUNTER — Encounter: Payer: Self-pay | Admitting: Family

## 2020-11-21 MED ORDER — AMLODIPINE BESYLATE 10 MG PO TABS
10.0000 mg | ORAL_TABLET | Freq: Every day | ORAL | 0 refills | Status: DC
  Filled 2020-11-21: qty 90, 90d supply, fill #0

## 2020-11-28 ENCOUNTER — Other Ambulatory Visit: Payer: Self-pay

## 2020-12-10 ENCOUNTER — Other Ambulatory Visit: Payer: Self-pay

## 2020-12-21 ENCOUNTER — Other Ambulatory Visit: Payer: Self-pay

## 2020-12-26 ENCOUNTER — Ambulatory Visit: Payer: No Typology Code available for payment source | Admitting: Nurse Practitioner

## 2021-01-03 ENCOUNTER — Telehealth: Payer: Self-pay

## 2021-01-03 NOTE — Telephone Encounter (Signed)
Call placed to patient, spoke to his wife, Ronny Bacon.   Patient in need of CPAP machine and he is currently uninsured and unable to afford the cost of a new CPAP machine.    Informed her about the [employer] ([employer]) CPAP Assistance Program now that it is up and running again post COVID.   They provide refurbished machines to individuals for a $100 program fee. There is no warranty or technical support that comes with the machine, it is provided " as is."    The program currently reports a wait of about 3.5-4 months for a machine to be available for delivery. Ronny Bacon said that he is not able to afford the program fee at this time.  Informed her that [facility] has funding available to assist with this.  She agreed to submitting the order for the CPAP machine to [employer] and have  the machine delivered to [facility]. When picks up the machine at [facility], he will be asked to sign a waiver of liability for the [employer]AA     Application sent to Durene Fruits, NP for signature.

## 2021-01-04 NOTE — Telephone Encounter (Signed)
CPAP Assistance Program application complete. Will ask Elmon Else, CMA to return.

## 2021-01-08 NOTE — Telephone Encounter (Signed)
Great.  Thanks

## 2021-01-24 ENCOUNTER — Ambulatory Visit (HOSPITAL_COMMUNITY): Payer: Self-pay | Admitting: Psychiatry

## 2021-01-25 NOTE — Progress Notes (Signed)
Patient ID: Matthew Odom, male    DOB: Jun 28, 1970  MRN: [id number]  CC: Annual Physical Exam   Subjective: Matthew Odom is a 50 y.o. male who presents for annual physical exam.   His concerns today include:   DIABETES TYPE 2 FOLLOW-UP: 11/20/2020:  - Hemoglobin not at goal at 12.6% on 11/07/2020, goal < 7%. This is increased from previous hemoglobin A1c of 7.0% on 04/24/2020. Next hemoglobin A1c due October 2022.  - Patient was taking Insulin Glargine 10 units daily instead of prescribed 20 units daily with concerns of hypoglycemia. Counseled patient to resume Insulin Glargine 20 units daily as prescribed and to consistently check blood sugars.  - Counseled patient that he can take Insulin Glargine 10 units in the morning and 10 units at bedtime to see if this helps decrease events of hypoglycemia. Counseled to let me know if he experiences hypoglycemia, any other side effects, and to report to the Emergency Department if needed for immediate evaluation. Patient verbalized understanding.  - Continue Liraglutide as prescribed.  - Patient has CKD and intolerant to Metformin for this reason.   01/29/2021: Reports blood sugars on average are 180's. Reports if blood sugars are 100's he doesn't feel well. Reports he is not monitoring his diet.   2. HYPERTENSION FOLLOW-UP: 11/20/2020: - Continue current regimen Amlodipine, Carvedilol, Doxazosin, Hydralazine, Losartan, and Spironolactone as prescribed.  - Patient previously managed by Cardiology and was not medically released. Referral back to Cardiology for further evaluation and management.  - Follow-up with primary provider as scheduled.   01/29/2021: Reports has been 1 week without blood pressure medications. No chest pain. History of COPD and sleep apnea.   3. CKD FOLLOW-UP: Missed recent appointment and scheduled for next appointment October 2022.   Patient Active Problem List   Diagnosis Date Noted   Hyperglycemia 11/08/2020    Orthostatic hypotension 11/28/2019   Type 2 diabetes mellitus, with long-term current use of insulin ([place]) 09/27/2018   Chest pain 06/29/2018   Degenerative spondylolisthesis 04/04/2018   Lumbar radiculopathy 04/04/2018   Spinal stenosis of lumbar region with neurogenic claudication 04/04/2018   Synovial cyst of lumbar facet joint 04/04/2018   CKD (chronic kidney disease), stage III ([employer]) 01/03/2018   Essential hypertension 01/02/2018   Hyperlipemia 01/02/2018   Abnormal EKG 01/02/2018   COPD with acute bronchitis ([place]) 01/02/2018   Nicotine abuse 01/02/2018   Chronic combined systolic and diastolic CHF, NYHA class 3 ([place]) 02/15/2016   LVH (left ventricular hypertrophy) due to hypertensive disease 02/15/2016   Nonischemic cardiomyopathy ([place]) 03/23/2015     Current Outpatient Medications on File Prior to Visit  Medication Sig Dispense Refill   albuterol (VENTOLIN HFA) 108 (90 Base) MCG/ACT inhaler Inhale 2 puffs into the lungs every 6 (six) hours as needed for wheezing or shortness of breath (or cough). ONLY AS NEEDED. Not meant for every day use 18 g 1   ALPRAZolam (XANAX) 0.25 MG tablet Take by mouth.     atorvastatin (LIPITOR) 40 MG tablet TAKE 1 TABLET (40 MG TOTAL) BY MOUTH DAILY. 90 tablet 1   budesonide-formoterol (SYMBICORT) 160-4.5 MCG/ACT inhaler Inhale 2 puffs into the lungs 2 (two) times daily. 1 Inhaler 3   chlorhexidine (PERIDEX) 0.12 % solution USE AS DIRECTED 15 MLS IN THE MOUTH OR THROAT 2 (TWO) TIMES DAILY. 473 mL 0   ciclopirox (PENLAC) 8 % solution APPLY TOPICALLY AT BEDTIME. APPLY TO AFFECTED TOENAILS ONCE DAILY FOR 48 WEEKS. REMOVE ONCE WEEKLY WITH NAIL POLISH  REMOVER. 6.6 mL 11   hydrOXYzine (ATARAX/VISTARIL) 25 MG tablet Take 1 tablet (25 mg total) by mouth 3 (three) times daily as needed. 60 tablet 0   isosorbide mononitrate (IMDUR) 30 MG 24 hr tablet Take by mouth.     Misc. Devices MISC Please provide patient with insurance approved CPAP with the following  settings:  autopap 15-20.  Large size [employer] Full Face Mask Forma mask and heated humidification. (Patient taking differently: Please provide patient with insurance approved CPAP with the following settings:  autopap 15-20.  Large size [employer] Full Face Mask Forma mask and heated humidification.) 1 each 0   nitroGLYCERIN (NITROSTAT) 0.4 MG SL tablet Place 1 tablet (0.4 mg total) under the tongue every 5 (five) minutes as needed for chest pain. 25 tablet 0   Current Facility-Administered Medications on File Prior to Visit  Medication Dose Route Frequency Provider Last Rate Last Admin   insulin aspart (novoLOG) injection 10 Units  10 Units Subcutaneous Once Mayers, Cari S, PA-C        Allergies  Allergen Reactions   Aspirin Hives   Nitroglycerin Other (See Comments)    Nitroglycerin paste will make blood pressure go up and cause headaches. The nitroglycerin tablets do fine.    Social History   Socioeconomic History   Marital status: Married    Spouse name: Not on file   Number of children: Not on file   Years of education: Not on file   Highest education level: Not on file  Occupational History   Not on file  Tobacco Use   Smoking status: Every Day    Packs/day: 1.00    Years: 40.00    Pack years: 40.00    Types: Cigarettes   Smokeless tobacco: Never  Substance and Sexual Activity   Alcohol use: Yes    Comment: occasionally   Drug use: Never   Sexual activity: Yes  Other Topics Concern   Not on file  Social History Narrative   Not on file   Social Determinants of Health   Financial Resource Strain: Not on file  Food Insecurity: Not on file  Transportation Needs: Not on file  Physical Activity: Not on file  Stress: Not on file  Social Connections: Not on file  Intimate Partner Violence: Not on file    Family History  Problem Relation Age of Onset   Heart attack Mother    Hypertension Mother    Diabetes Mother    Heart disease Sister     Hypertension Sister    Hypertension Father    Emphysema Father     Past Surgical History:  Procedure Laterality Date   CARDIAC CATHETERIZATION     NO PAST SURGERIES      ROS: Review of Systems Negative except as stated above  Physical Exam HENT:     Head: Normocephalic and atraumatic.     Right Ear: Tympanic membrane, ear canal and external ear normal.     Left Ear: Tympanic membrane, ear canal and external ear normal.  Eyes:     Extraocular Movements: Extraocular movements intact.     Conjunctiva/sclera: Conjunctivae normal.     Pupils: Pupils are equal, round, and reactive to light.  Cardiovascular:     Rate and Rhythm: Normal rate and regular rhythm.     Pulses: Normal pulses.     Heart sounds: Normal heart sounds.  Pulmonary:     Effort: Pulmonary effort is normal.     Breath sounds: Normal breath sounds.  Abdominal:     General: Bowel sounds are normal.     Palpations: Abdomen is soft.  Genitourinary:    Comments: Patient declined exam.  Musculoskeletal:        General: Normal range of motion.     Cervical back: Normal range of motion and neck supple.  Skin:    General: Skin is warm and dry.     Capillary Refill: Capillary refill takes less than 2 seconds.  Neurological:     General: No focal deficit present.     Mental Status: He is alert and oriented to person, place, and time.  Psychiatric:        Mood and Affect: Mood normal.        Behavior: Behavior normal.    ASSESSMENT AND PLAN: 1. Annual physical exam: - Counseled on 150 minutes of exercise per week as tolerated, healthy eating (including decreased daily intake of saturated fats, cholesterol, added sugars, sodium), STI prevention, and routine healthcare maintenance.  2. Screening for metabolic disorder: - CMP last obtained 11/16/2020. - BMP to evaluate kidney function and electrolyte balance. - Basic Metabolic Panel  3. Screening for deficiency anemia: - CBC last obtained 11/07/2020.  4.  Screening cholesterol level: - Lipid panel to screen for high cholesterol.  - Lipid panel  5. Thyroid disorder screen: - TSH to check thyroid function.  - TSH  6. Colon cancer screening: - Patient declines colonoscopy.  - Fecal occult blood per patient preference.  - Fecal occult blood, imunochemical([facility])  7. Type 2 diabetes mellitus with hyperglycemia, with long-term current use of insulin ([place]): - Hemoglobin A1c today not at goal at 10.0%, goal < 7%. This is decreased from previous hemoglobin A1c of 12.6% on 11/07/2020. - Increase Insulin Glargine from 20 units daily to 25 units daily.  - Continue Liraglutide as prescribed.  - Patient has CKD and intolerant to Metformin for this reason. - Discussed the importance of healthy eating habits, low-carbohydrate diet, low-sugar diet, regular aerobic exercise (at least 150 minutes a week as tolerated) and medication compliance to achieve or maintain control of diabetes. - Referral to Ophthalmology for diabetic eye exam.  - Referral to Endocrinology for further evaluation and management.  - Follow-up with primary provider as scheduled.  - POCT glycosylated hemoglobin (Hb A1C) - Ambulatory referral to Ophthalmology - Ambulatory referral to Endocrinology  8. Essential hypertension: - Blood pressure not at goal during today's visit. Patient asymptomatic without chest pressure, chest pain, palpitations, shortness of breath, worst headache of life, and any additional red flag symptoms. - Patient has been 1 week without blood pressure medications due to running out.  - Continue Amlodipine, Carvedilol, Hydralazine, and Spironolactone as prescribed. Counseled to take blood pressure medications today once returning home, patient agreeable.  - Counseled on blood pressure goal of less than 130/80, low-sodium, DASH diet, medication compliance, 150 minutes of moderate intensity exercise per week as tolerated. Discussed medication compliance,  adverse effects. - Keep appointment scheduled at Cardiology with Skeet Latch, MD on 02/13/2021. - Follow-up for blood pressure check with CMA in 1 week.  - Follow-up with primary provider as scheduled.  - amLODipine (NORVASC) 10 MG tablet; Take 1 tablet (10 mg total) by mouth daily.  Dispense: 30 tablet; Refill: 0 - carvedilol (COREG) 25 MG tablet; Take 1 tablet (25 mg total) by mouth 2 (two) times daily with a meal.  Dispense: 60 tablet; Refill: 0 - hydrALAZINE (APRESOLINE) 100 MG tablet; Take 1 tablet by mouth three times daily  Dispense: 90 tablet; Refill: 0 - spironolactone (ALDACTONE) 50 MG tablet; Take 1 tablet (50 mg total) by mouth daily.  Dispense: 30 tablet; Refill: 0  9. Hypertensive left ventricular hypertrophy with heart failure ([place]): - Continue Doxazosin as prescribed.  - Keep appointment scheduled at Cardiology with Skeet Latch, MD on 02/13/2021. - doxazosin (CARDURA) 4 MG tablet; Take 1 tablet (4 mg total) by mouth at bedtime.  Dispense: 30 tablet; Refill: 0  10. Stage 4 chronic kidney disease [facility]): - Keep all scheduled appointments with Nephrology.     Patient was given the opportunity to ask questions.  Patient verbalized understanding of the plan and was able to repeat key elements of the plan. Patient was given clear instructions to go to Emergency Department or return to medical center if symptoms don't improve, worsen, or new problems develop.The patient verbalized understanding.   Orders Placed This Encounter  Procedures   Fecal occult blood, imunochemical([facility])   Basic Metabolic Panel   Lipid panel   TSH   Ambulatory referral to Ophthalmology   Ambulatory referral to Endocrinology   POCT glycosylated hemoglobin (Hb A1C)     Requested Prescriptions   Signed Prescriptions Disp Refills   amLODipine (NORVASC) 10 MG tablet 30 tablet 0    Sig: Take 1 tablet (10 mg total) by mouth daily.   carvedilol (COREG) 25 MG tablet 60 tablet 0     Sig: Take 1 tablet (25 mg total) by mouth 2 (two) times daily with a meal.   doxazosin (CARDURA) 4 MG tablet 30 tablet 0    Sig: Take 1 tablet (4 mg total) by mouth at bedtime.   hydrALAZINE (APRESOLINE) 100 MG tablet 90 tablet 0    Sig: Take 1 tablet by mouth three times daily   losartan (COZAAR) 25 MG tablet 30 tablet 0    Sig: Take 1 tablet (25 mg total) by mouth daily.   spironolactone (ALDACTONE) 50 MG tablet 30 tablet 0    Sig: Take 1 tablet (50 mg total) by mouth daily.   liraglutide (VICTOZA) 18 MG/3ML SOPN 9 mL 0    Sig: Inject 1.8 mg into the skin daily.   Insulin Glargine (BASAGLAR KWIKPEN) 100 UNIT/ML 7.5 mL 0    Sig: Inject 25 Units into the skin daily.   Insulin Pen Needle 31G X 6 MM MISC 100 each 0    Sig: Use as directed 2 (two) times daily.    Return in about 1 year (around 01/29/2022) for Physical per patient preference and 1 week blood pressure check with CMA .  Camillia Herter, NP

## 2021-01-29 ENCOUNTER — Ambulatory Visit (INDEPENDENT_AMBULATORY_CARE_PROVIDER_SITE_OTHER): Payer: Self-pay | Admitting: Family

## 2021-01-29 ENCOUNTER — Other Ambulatory Visit: Payer: Self-pay

## 2021-01-29 ENCOUNTER — Encounter: Payer: Self-pay | Admitting: Family

## 2021-01-29 VITALS — BP 188/125 | HR 80 | Temp 98.3°F | Resp 16 | Ht 70.98 in | Wt 250.2 lb

## 2021-01-29 DIAGNOSIS — Z1329 Encounter for screening for other suspected endocrine disorder: Secondary | ICD-10-CM

## 2021-01-29 DIAGNOSIS — Z794 Long term (current) use of insulin: Secondary | ICD-10-CM

## 2021-01-29 DIAGNOSIS — Z13228 Encounter for screening for other metabolic disorders: Secondary | ICD-10-CM

## 2021-01-29 DIAGNOSIS — I10 Essential (primary) hypertension: Secondary | ICD-10-CM

## 2021-01-29 DIAGNOSIS — N184 Chronic kidney disease, stage 4 (severe): Secondary | ICD-10-CM

## 2021-01-29 DIAGNOSIS — Z130 Encounter for screening for diseases of the blood and blood-forming organs and certain disorders involving the immune mechanism: Secondary | ICD-10-CM

## 2021-01-29 DIAGNOSIS — Z13220 Encounter for screening for lipoid disorders: Secondary | ICD-10-CM

## 2021-01-29 DIAGNOSIS — Z0000 Encounter for general adult medical examination without abnormal findings: Secondary | ICD-10-CM

## 2021-01-29 DIAGNOSIS — E1169 Type 2 diabetes mellitus with other specified complication: Secondary | ICD-10-CM

## 2021-01-29 DIAGNOSIS — Z1211 Encounter for screening for malignant neoplasm of colon: Secondary | ICD-10-CM

## 2021-01-29 DIAGNOSIS — I110 Hypertensive heart disease with heart failure: Secondary | ICD-10-CM

## 2021-01-29 DIAGNOSIS — E1165 Type 2 diabetes mellitus with hyperglycemia: Secondary | ICD-10-CM

## 2021-01-29 DIAGNOSIS — Z0001 Encounter for general adult medical examination with abnormal findings: Secondary | ICD-10-CM

## 2021-01-29 LAB — POCT GLYCOSYLATED HEMOGLOBIN (HGB A1C): Hemoglobin A1C: 10 % — AB (ref 4.0–5.6)

## 2021-01-29 MED ORDER — SPIRONOLACTONE 50 MG PO TABS
50.0000 mg | ORAL_TABLET | Freq: Every day | ORAL | 0 refills | Status: DC
  Filled 2021-01-29 – 2021-02-05 (×2): qty 30, 30d supply, fill #0

## 2021-01-29 MED ORDER — LOSARTAN POTASSIUM 25 MG PO TABS
25.0000 mg | ORAL_TABLET | Freq: Every day | ORAL | 0 refills | Status: DC
  Filled 2021-01-29 – 2021-02-05 (×2): qty 30, 30d supply, fill #0

## 2021-01-29 MED ORDER — VICTOZA 18 MG/3ML ~~LOC~~ SOPN
1.8000 mg | PEN_INJECTOR | Freq: Every day | SUBCUTANEOUS | 0 refills | Status: DC
  Filled 2021-01-29 – 2021-03-06 (×2): qty 9, 30d supply, fill #0

## 2021-01-29 MED ORDER — HYDRALAZINE HCL 100 MG PO TABS
ORAL_TABLET | ORAL | 0 refills | Status: DC
Start: 2021-01-29 — End: 2021-05-10
  Filled 2021-01-29 – 2021-02-05 (×2): qty 90, 30d supply, fill #0

## 2021-01-29 MED ORDER — AMLODIPINE BESYLATE 10 MG PO TABS
10.0000 mg | ORAL_TABLET | Freq: Every day | ORAL | 0 refills | Status: DC
  Filled 2021-01-29 – 2021-02-05 (×2): qty 30, 30d supply, fill #0

## 2021-01-29 MED ORDER — INSULIN PEN NEEDLE 31G X 6 MM MISC
1.0000 | Freq: Two times a day (BID) | 0 refills | Status: DC
  Filled 2021-01-29: qty 100, 25d supply, fill #0

## 2021-01-29 MED ORDER — DOXAZOSIN MESYLATE 4 MG PO TABS
4.0000 mg | ORAL_TABLET | Freq: Every day | ORAL | 0 refills | Status: DC
  Filled 2021-01-29 – 2021-02-05 (×2): qty 30, 30d supply, fill #0

## 2021-01-29 MED ORDER — BASAGLAR KWIKPEN 100 UNIT/ML ~~LOC~~ SOPN
25.0000 [IU] | PEN_INJECTOR | Freq: Every day | SUBCUTANEOUS | 0 refills | Status: DC
Start: 2021-01-29 — End: 2021-05-10
  Filled 2021-01-29 – 2021-03-06 (×2): qty 6, 24d supply, fill #0

## 2021-01-29 MED ORDER — CARVEDILOL 25 MG PO TABS
25.0000 mg | ORAL_TABLET | Freq: Two times a day (BID) | ORAL | 0 refills | Status: DC
  Filled 2021-01-29 – 2021-02-05 (×2): qty 60, 30d supply, fill #0

## 2021-01-29 NOTE — Patient Instructions (Signed)
Preventive Care 40-50 Years Old, Male Preventive care refers to lifestyle choices and visits with your health care provider that can promote health and wellness. This includes: A yearly physical exam. This is also called an annual wellness visit. Regular dental and eye exams. Immunizations. Screening for certain conditions. Healthy lifestyle choices, such as: Eating a healthy diet. Getting regular exercise. Not using drugs or products that contain nicotine and tobacco. Limiting alcohol use. What can I expect for my preventive care visit? Physical exam Your health care provider will check your: Height and weight. These may be used to calculate your BMI (body mass index). BMI is a measurement that tells if you are at a healthy weight. Heart rate and blood pressure. Body temperature. Skin for abnormal spots. Counseling Your health care provider may ask you questions about your: Past medical problems. Family's medical history. Alcohol, tobacco, and drug use. Emotional well-being. Home life and relationship well-being. Sexual activity. Diet, exercise, and sleep habits. Work and work environment. Access to firearms. What immunizations do I need? Vaccines are usually given at various ages, according to a schedule. Your health care provider will recommend vaccines for you based on your age, medical history, and lifestyle or other factors, such as travel or where you work. What tests do I need? Blood tests Lipid and cholesterol levels. These may be checked every 5 years, or more often if you are over 50 years old. Hepatitis C test. Hepatitis B test. Screening Lung cancer screening. You may have this screening every year starting at age 55 if you have a 30-pack-year history of smoking and currently smoke or have quit within the past 15 years. Prostate cancer screening. Recommendations will vary depending on your family history and other risks. Genital exam to check for testicular cancer  or hernias. Colorectal cancer screening. All adults should have this screening starting at age 50 and continuing until age 75. Your health care provider may recommend screening at age 45 if you are at increased risk. You will have tests every 1-10 years, depending on your results and the type of screening test. Diabetes screening. This is done by checking your blood sugar (glucose) after you have not eaten for a while (fasting). You may have this done every 1-3 years. STD (sexually transmitted disease) testing, if you are at risk. Follow these instructions at home: Eating and drinking  Eat a diet that includes fresh fruits and vegetables, whole grains, lean protein, and low-fat dairy products. Take vitamin and mineral supplements as recommended by your health care provider. Do not drink alcohol if your health care provider tells you not to drink. If you drink alcohol: Limit how much you have to 0-2 drinks a day. Be aware of how much alcohol is in your drink. In the [place] one drink equals one 12 oz bottle of beer (355 mL), one 5 oz glass of wine (148 mL), or one 1 oz glass of hard liquor (44 mL). Lifestyle Take daily care of your teeth and gums. Brush your teeth every morning and night with fluoride toothpaste. Floss one time each day. Stay active. Exercise for at least 30 minutes 5 or more days each week. Do not use any products that contain nicotine or tobacco, such as cigarettes, e-cigarettes, and chewing tobacco. If you need help quitting, ask your health care provider. Do not use drugs. If you are sexually active, practice safe sex. Use a condom or other form of protection to prevent STIs (sexually transmitted infections). If told by your   health care provider, take low-dose aspirin daily starting at age 50. Find healthy ways to cope with stress, such as: Meditation, yoga, or listening to music. Journaling. Talking to a trusted person. Spending time with friends and  family. Safety Always wear your seat belt while driving or riding in a vehicle. Do not drive: If you have been drinking alcohol. Do not ride with someone who has been drinking. When you are tired or distracted. While texting. Wear a helmet and other protective equipment during sports activities. If you have firearms in your house, make sure you follow all gun safety procedures. What's next? Go to your health care provider once a year for an annual wellness visit. Ask your health care provider how often you should have your eyes and teeth checked. Stay up to date on all vaccines. This information is not intended to replace advice given to you by your health care provider. Make sure you discuss any questions you have with your health care provider. Document Revised: 06/29/2020 Document Reviewed: 04/15/2018 Elsevier Patient Education  2022 [employer]vier Inc.   

## 2021-01-29 NOTE — Progress Notes (Signed)
.  Pt presents for annual physical exam  

## 2021-01-30 ENCOUNTER — Other Ambulatory Visit: Payer: Self-pay | Admitting: Family

## 2021-01-30 ENCOUNTER — Other Ambulatory Visit: Payer: Self-pay

## 2021-01-30 DIAGNOSIS — E782 Mixed hyperlipidemia: Secondary | ICD-10-CM

## 2021-01-30 LAB — LIPID PANEL
Chol/HDL Ratio: 6.3 ratio — ABNORMAL HIGH (ref 0.0–5.0)
Cholesterol, Total: 220 mg/dL — ABNORMAL HIGH (ref 100–199)
HDL: 35 mg/dL — ABNORMAL LOW (ref >39)
LDL Chol Calc (NIH): 126 mg/dL — ABNORMAL HIGH (ref 0–99)
Triglycerides: 331 mg/dL — ABNORMAL HIGH (ref 0–149)
VLDL Cholesterol Cal: 59 mg/dL — ABNORMAL HIGH (ref 5–40)

## 2021-01-30 LAB — BASIC METABOLIC PANEL
BUN/Creatinine Ratio: 12 (ref 9–20)
BUN: 37 mg/dL — ABNORMAL HIGH (ref 6–24)
CO2: 21 mmol/L (ref 20–29)
Calcium: 8.3 mg/dL — ABNORMAL LOW (ref 8.7–10.2)
Chloride: 100 mmol/L (ref 96–106)
Creatinine, Ser: 3.11 mg/dL — ABNORMAL HIGH (ref 0.76–1.27)
Glucose: 334 mg/dL — ABNORMAL HIGH (ref 70–99)
Potassium: 3.8 mmol/L (ref 3.5–5.2)
Sodium: 138 mmol/L (ref 134–144)
eGFR: 23 mL/min/{1.73_m2} — ABNORMAL LOW (ref >59)

## 2021-01-30 LAB — TSH: TSH: 1.85 u[IU]/mL (ref 0.450–4.500)

## 2021-01-30 MED ORDER — ATORVASTATIN CALCIUM 80 MG PO TABS
80.0000 mg | ORAL_TABLET | Freq: Every day | ORAL | 1 refills | Status: DC
  Filled 2021-01-30: qty 30, 30d supply, fill #0

## 2021-01-30 NOTE — Progress Notes (Signed)
Thyroid function normal.   Kidney function decreased since 8 weeks ago. Reminder to keep appointment scheduled with Nephrology in October 2022.   Calcium lower than normal. Increase calcium-rich foods in the diet such as milk, yogurt, and green leafy vegetables.  Cholesterol remaining higher than normal. Increase Atorvastatin to 80 mg daily. Encouraged to recheck cholesterol at lab only appointment in 6 to 8 weeks.   Diabetes discussed in office.

## 2021-02-05 ENCOUNTER — Other Ambulatory Visit: Payer: Self-pay

## 2021-02-05 ENCOUNTER — Ambulatory Visit: Payer: Self-pay

## 2021-02-05 VITALS — BP 131/78 | HR 87

## 2021-02-05 DIAGNOSIS — I10 Essential (primary) hypertension: Secondary | ICD-10-CM

## 2021-02-05 NOTE — Progress Notes (Signed)
Pt presents for BP check w/nurse, keep scheduled appt w/cardiology

## 2021-02-12 ENCOUNTER — Encounter (HOSPITAL_BASED_OUTPATIENT_CLINIC_OR_DEPARTMENT_OTHER): Payer: Self-pay

## 2021-02-12 ENCOUNTER — Other Ambulatory Visit: Payer: Self-pay

## 2021-02-12 DIAGNOSIS — Z7984 Long term (current) use of oral hypoglycemic drugs: Secondary | ICD-10-CM | POA: Insufficient documentation

## 2021-02-12 DIAGNOSIS — N183 Chronic kidney disease, stage 3 unspecified: Secondary | ICD-10-CM | POA: Insufficient documentation

## 2021-02-12 DIAGNOSIS — I130 Hypertensive heart and chronic kidney disease with heart failure and stage 1 through stage 4 chronic kidney disease, or unspecified chronic kidney disease: Secondary | ICD-10-CM | POA: Insufficient documentation

## 2021-02-12 DIAGNOSIS — F17210 Nicotine dependence, cigarettes, uncomplicated: Secondary | ICD-10-CM | POA: Diagnosis not present

## 2021-02-12 DIAGNOSIS — I5042 Chronic combined systolic (congestive) and diastolic (congestive) heart failure: Secondary | ICD-10-CM | POA: Diagnosis not present

## 2021-02-12 DIAGNOSIS — Z794 Long term (current) use of insulin: Secondary | ICD-10-CM | POA: Insufficient documentation

## 2021-02-12 DIAGNOSIS — E1122 Type 2 diabetes mellitus with diabetic chronic kidney disease: Secondary | ICD-10-CM | POA: Diagnosis not present

## 2021-02-12 DIAGNOSIS — Z79899 Other long term (current) drug therapy: Secondary | ICD-10-CM | POA: Diagnosis not present

## 2021-02-12 DIAGNOSIS — M545 Low back pain, unspecified: Secondary | ICD-10-CM | POA: Diagnosis present

## 2021-02-12 DIAGNOSIS — Z7951 Long term (current) use of inhaled steroids: Secondary | ICD-10-CM | POA: Diagnosis not present

## 2021-02-12 DIAGNOSIS — J449 Chronic obstructive pulmonary disease, unspecified: Secondary | ICD-10-CM | POA: Diagnosis not present

## 2021-02-12 NOTE — ED Triage Notes (Signed)
Pt reports low back pain x few days and got worse today. Also endorses tingling sensation to both legs - denies injury

## 2021-02-13 ENCOUNTER — Telehealth (INDEPENDENT_AMBULATORY_CARE_PROVIDER_SITE_OTHER): Payer: Self-pay

## 2021-02-13 ENCOUNTER — Ambulatory Visit (HOSPITAL_BASED_OUTPATIENT_CLINIC_OR_DEPARTMENT_OTHER): Payer: No Typology Code available for payment source | Admitting: Cardiovascular Disease

## 2021-02-13 ENCOUNTER — Emergency Department (HOSPITAL_BASED_OUTPATIENT_CLINIC_OR_DEPARTMENT_OTHER)
Admission: EM | Admit: 2021-02-13 | Discharge: 2021-02-13 | Disposition: A | Discharge status: Home / Self Care | Payer: Medicaid Other | Attending: Emergency Medicine | Admitting: Emergency Medicine

## 2021-02-13 ENCOUNTER — Emergency Department (HOSPITAL_BASED_OUTPATIENT_CLINIC_OR_DEPARTMENT_OTHER): Payer: Medicaid Other

## 2021-02-13 ENCOUNTER — Other Ambulatory Visit: Payer: Self-pay

## 2021-02-13 DIAGNOSIS — S39012A Strain of muscle, fascia and tendon of lower back, initial encounter: Secondary | ICD-10-CM

## 2021-02-13 MED ORDER — PREDNISONE 20 MG PO TABS
40.0000 mg | ORAL_TABLET | Freq: Once | ORAL | Status: AC
  Administered 2021-02-13: 40 mg via ORAL
  Filled 2021-02-13: qty 2

## 2021-02-13 MED ORDER — HYDROMORPHONE HCL 1 MG/ML IJ SOLN
2.0000 mg | Freq: Once | INTRAMUSCULAR | Status: AC
  Administered 2021-02-13: 2 mg via INTRAMUSCULAR
  Filled 2021-02-13: qty 2

## 2021-02-13 MED ORDER — HYDROCODONE-ACETAMINOPHEN 5-325 MG PO TABS
1.0000 | ORAL_TABLET | Freq: Four times a day (QID) | ORAL | 0 refills | Status: DC | PRN
  Filled 2021-02-13: qty 15, 2d supply, fill #0

## 2021-02-13 MED ORDER — FUROSEMIDE 40 MG PO TABS
ORAL_TABLET | ORAL | 11 refills | Status: DC
  Filled 2021-02-13: qty 30, 30d supply, fill #0

## 2021-02-13 MED ORDER — HYDROCODONE-ACETAMINOPHEN 5-325 MG PO TABS: 1.0000 | ORAL_TABLET | Freq: Four times a day (QID) | ORAL | 0 refills | Status: DC | PRN

## 2021-02-13 MED ORDER — PREDNISONE 10 MG PO TABS
20.0000 mg | ORAL_TABLET | Freq: Two times a day (BID) | ORAL | 0 refills | Status: DC
  Filled 2021-02-13: qty 20, 5d supply, fill #0

## 2021-02-13 NOTE — Progress Notes (Deleted)
Hypertension Clinic Follow up:    Date:  02/13/2021   ID:  Matthew Odom, DOB Dec 11, 1970, MRN [id number]  PCP:  Camillia Herter, NP  Cardiologist:  None  Nephrologist:  Referring MD: Camillia Herter, NP   CC: Hypertension  History of Present Illness:    Matthew Odom is a 50 y.o. male with a hx of nonobstructive CAD, chronic systolic diastolic heart failure (LVEF 45 to 50%), CKD 3B, hypertension, diabetes, tobacco abuse, and cocaine abuse here to establish care in the hypertension clinic.  He reports first being diagnosed with hypertension in his 3s.  It has never been very well-controlled.  He was initially started on hydrochlorothiazide and became frustrated because it was not working.  He tried to tell providers that they continue to give it to him.  He stopped taking his medicines altogether.  He saw Dr. Raul Del with [facility] 10/2019 and had not been following up with any primary care provider.  He was started on clonidine, amlodipine, carvedilol, hydralazine, and spironolactone.  At that time his blood pressure was 220/135.  He followed up the following month and was hypotensive.  He received 500 mL and was instructed to hold hydralazine for 48 hours.  He saw Dr. Audie Box on 8/23 after hospitalization for poorly controlled hypertension and mildly elevated troponin.  He had a cath in 2017 that revealed minimal nonobstructive CAD.  When he saw Dr. Gordy Councilman his blood pressure was elevated to 210/130.  He was out of his amlodipine at the time.  He was started on nifedipine.  He was also referred to nephrology.  He did not understand that he should stop taking amlodipine at the time and has been taking both nifedipine and amlodipine.  He has a BP machine but isn't able to access it.  He checks it at the pharmacy and it was 132/90s.  His BP has been better on nifedipine but he has some ankle edema and he has been very tired.  He has been getting a little exercise.  He  walks around his neighborhood once per day for about 5-10 minutes.  He isn't adding any salt and avoids pork.  He is eating more vegetables and fresh foods.  He reports apnea and doesn't feel rested in the am.  He was diagnosed with OSA and is awaiting a CPAP machine.  He continues to smoke but denies any recent illicit substances.  Previous antihypertensives: HCTZ- didn't help  Past Medical History:  Diagnosis Date   Cocaine use    Diabetes mellitus without complication ([place])    Enlarged heart    History of degenerative disc disease    Hyperlipidemia    Hypertension    NSTEMI (non-ST elevated myocardial infarction) ([place])    Sciatic nerve pain     Past Surgical History:  Procedure Laterality Date   CARDIAC CATHETERIZATION     NO PAST SURGERIES      Current Medications: No outpatient medications have been marked as taking for the 02/13/21 encounter (Appointment) with Skeet Latch, MD.   Current Facility-Administered Medications for the 02/13/21 encounter (Appointment) with Skeet Latch, MD  Medication   insulin aspart (novoLOG) injection 10 Units     Allergies:   Aspirin and Nitroglycerin   Social History   Socioeconomic History   Marital status: Married    Spouse name: Not on file   Number of children: Not on file   Years of education: Not on file  Highest education level: Not on file  Occupational History   Not on file  Tobacco Use   Smoking status: Every Day    Packs/day: 1.00    Years: 40.00    Pack years: 40.00    Types: Cigarettes   Smokeless tobacco: Never  Substance and Sexual Activity   Alcohol use: Yes    Comment: occasionally   Drug use: Never   Sexual activity: Yes  Other Topics Concern   Not on file  Social History Narrative   Not on file   Social Determinants of Health   Financial Resource Strain: Not on file  Food Insecurity: Not on file  Transportation Needs: Not on file  Physical Activity: Not on file  Stress: Not on file   Social Connections: Not on file     Family History: The patient's family history includes Diabetes in his mother; Emphysema in his father; Heart attack in his mother; Heart disease in his sister; Hypertension in his father, mother, and sister.  ROS:   Please see the history of present illness.     All other systems reviewed and are negative.  EKGs/Labs/Other Studies Reviewed:    EKG:  EKG is not ordered today.  The ekg ordered 12/26/19 demonstrates sinus rhythm.  Rate 99 bpm.  LVH with secondary repolarization abnormalities  Recent Labs: 11/07/2020: Hemoglobin 13.6; Platelets 292 11/16/2020: ALT 16 01/29/2021: BUN 37; Creatinine, Ser 3.11; Potassium 3.8; Sodium 138; TSH 1.850   Recent Lipid Panel    Component Value Date/Time   CHOL 220 (H) 01/29/2021 1132   TRIG 331 (H) 01/29/2021 1132   HDL 35 (L) 01/29/2021 1132   CHOLHDL 6.3 (H) 01/29/2021 1132   CHOLHDL 6.1 01/03/2018 0335   VLDL 34 01/03/2018 0335   LDLCALC 126 (H) 01/29/2021 1132    Physical Exam:   VS:  There were no vitals taken for this visit. , BMI There is no height or weight on file to calculate BMI. GENERAL:  Well appearing HEENT: Pupils equal round and reactive, fundi not visualized, oral mucosa unremarkable NECK:  No jugular venous distention, waveform within normal limits, carotid upstroke brisk and symmetric, no bruits LUNGS:  Clear to auscultation bilaterally HEART:  RRR.  PMI not displaced or sustained,S1 and S2 within normal limits, no S3, no S4, no clicks, no rubs, no murmurs ABD:  Flat, positive bowel sounds normal in frequency in pitch, no bruits, no rebound, no guarding, no midline pulsatile mass, no hepatomegaly, no splenomegaly EXT:  2 plus pulses throughout, no edema, no cyanosis no clubbing SKIN:  No rashes no nodules NEURO:  Cranial nerves II through XII grossly intact, motor grossly intact throughout PSYCH:  Cognitively intact, oriented to person place and time   ASSESSMENT:    No diagnosis  found.   PLAN:    # Resistant hypertension: # Hypertensive heart disease: Matthew Odom BP has very elevated blood pressure but it is much better today, though not at goal.  We had a frank discussion about the importance of controlling his BP and the fact that it is affecting his kidney function and heart.  We will check renal artery Dopplers and a TSH.  He is taking both amlodipine and nifedipine and has more swelling. We will stop the nifedipine and continue amlodipine.  Continue carvedilol, hydralazine and spironolactone.  He is going to work on increasing his exercise. he is interested in enrolling in the PREP exercise and nutrition program through the [facility].    He will continue to  abstain from illicit substances.  He is using nicotine patches for smoking cessation.   Secondary Causes of Hypertension  Medications/Herbal: OCP, steroids, stimulants, antidepressants, weight loss medication, immune suppressants, NSAIDs, sympathomimetics, alcohol, caffeine, licorice, ginseng, St. John's wort, chemo (no more cocaine, attempting smoking cessation) Sleep Apnea: CPAP pending Renal artery stenosis: check renal artery Dopplers Hyperaldosteronism: Normal 01/2018 Hyper/hypothyroidism: Check TSH Pheochromocytoma: palpitations, tachycardia, headache, diaphoresis (plasma metanephrines) Cushing's syndrome: (testing not indicated)  Coarctation of the aorta: BP symmetric  he consents to be monitored in our remote patient monitoring program through [facility].  he will track his blood pressure twice daily and understands that these trends will help [place] to adjust his medications as needed prior to his next appointment.     Disposition:    FU with MD/PharmD in 1 month    Medication Adjustments/Labs and Tests Ordered: Current medicines are reviewed at length with the patient today.  Concerns regarding medicines are outlined above.  No orders of the defined types were placed in this encounter.  No orders of the  defined types were placed in this encounter.    Signed, Skeet Latch, MD  02/13/2021 8:24 AM    [place]

## 2021-02-13 NOTE — Telephone Encounter (Signed)
Per DPR left all results, appointment reminders, medication changes and dietary advise on patient voicemail. Asked patient to return call to [facility] at [phone] with any questions or concerns. Also advised that results are available through mychart. Nat Christen, CMA

## 2021-02-13 NOTE — Discharge Instructions (Addendum)
Begin taking prednisone as prescribed and hydrocodone as prescribed as needed for pain.  Rest.  Follow-up with your primary doctor if your symptoms or not improving in the next week, and return to the ER if symptoms significantly worsen or change.

## 2021-02-13 NOTE — ED Notes (Signed)
Patient transported to CT 

## 2021-02-13 NOTE — ED Provider Notes (Signed)
[facility] EMERGENCY DEPT Provider Note   CSN: [id number] Arrival date & time: 02/12/21  2239     History Chief Complaint  Patient presents with   Back Pain    Matthew Odom is a 50 y.o. male.  Patient is a 50 year old male with past medical history of diabetes, COPD, nonischemic cardiomyopathy, hypertension.  Patient presenting today with complaints of low back pain.  He reports helping a friend unload a dump truck 2 days ago.  He fell off of the truck and landed on his left side.  He believes himself to be uninjured at the time, however he has been developing worsening back pain over the past 2 days.  He describes severe pain in his low back that radiates into both legs.  He denies any weakness or numbness.  He denies any bowel or bladder complaints.  Pain is worse when he bends and moves.  The history is provided by the patient.  Back Pain Location:  Lumbar spine Quality:  Stabbing Radiates to:  R thigh and L thigh Pain severity:  Severe Pain is:  Same all the time Onset quality:  Gradual Duration:  2 days Timing:  Constant Progression:  Worsening Chronicity:  New Context: falling       Past Medical History:  Diagnosis Date   Cocaine use    Diabetes mellitus without complication (HCC)    Enlarged heart    History of degenerative disc disease    Hyperlipidemia    Hypertension    NSTEMI (non-ST elevated myocardial infarction) ([place])    Sciatic nerve pain     Patient Active Problem List   Diagnosis Date Noted   Hyperglycemia 11/08/2020   Orthostatic hypotension 11/28/2019   Type 2 diabetes mellitus, with long-term current use of insulin ([place]) 09/27/2018   Chest pain 06/29/2018   Degenerative spondylolisthesis 04/04/2018   Lumbar radiculopathy 04/04/2018   Spinal stenosis of lumbar region with neurogenic claudication 04/04/2018   Synovial cyst of lumbar facet joint 04/04/2018   CKD (chronic kidney disease), stage III ([place]) 01/03/2018   Essential  hypertension 01/02/2018   Hyperlipemia 01/02/2018   Abnormal EKG 01/02/2018   COPD with acute bronchitis ([place]) 01/02/2018   Nicotine abuse 01/02/2018   Chronic combined systolic and diastolic CHF, NYHA class 3 ([place]) 02/15/2016   LVH (left ventricular hypertrophy) due to hypertensive disease 02/15/2016   Nonischemic cardiomyopathy ([place]) 03/23/2015    Past Surgical History:  Procedure Laterality Date   CARDIAC CATHETERIZATION     NO PAST SURGERIES         Family History  Problem Relation Age of Onset   Heart attack Mother    Hypertension Mother    Diabetes Mother    Heart disease Sister    Hypertension Sister    Hypertension Father    Emphysema Father     Social History   Tobacco Use   Smoking status: Every Day    Packs/day: 1.00    Years: 40.00    Pack years: 40.00    Types: Cigarettes   Smokeless tobacco: Never  Substance Use Topics   Alcohol use: Yes    Comment: occasionally   Drug use: Never    Home Medications Prior to Admission medications   Medication Sig Start Date End Date Taking? Authorizing Provider  albuterol (VENTOLIN HFA) 108 (90 Base) MCG/ACT inhaler Inhale 2 puffs into the lungs every 6 (six) hours as needed for wheezing or shortness of breath (or cough). ONLY AS NEEDED. Not meant for  every day use 11/08/19   [name], NP  ALPRAZolam Duanne Moron) 0.25 MG tablet Take by mouth. 09/25/19   [provider]  amLODipine (NORVASC) 10 MG tablet Take 1 tablet (10 mg total) by mouth daily. 01/29/21 03/07/21  Camillia Herter, NP  atorvastatin (LIPITOR) 80 MG tablet Take 1 tablet (80 mg total) by mouth daily. 01/30/21 07/29/21  Camillia Herter, NP  budesonide-formoterol (SYMBICORT) 160-4.5 MCG/ACT inhaler Inhale 2 puffs into the lungs 2 (two) times daily. 11/08/19   [name], NP  carvedilol (COREG) 25 MG tablet Take 1 tablet (25 mg total) by mouth 2 (two) times daily with a meal. 01/29/21 03/07/21  Camillia Herter, NP  chlorhexidine (PERIDEX) 0.12 %  solution USE AS DIRECTED 15 MLS IN THE MOUTH OR THROAT 2 (TWO) TIMES DAILY. 05/09/20 05/09/21  [name], NP  ciclopirox (PENLAC) 8 % solution APPLY TOPICALLY AT BEDTIME. APPLY TO AFFECTED TOENAILS ONCE DAILY FOR 48 WEEKS. REMOVE ONCE WEEKLY WITH NAIL POLISH REMOVER. 07/27/20 07/27/21  Marzetta Board, DPM  doxazosin (CARDURA) 4 MG tablet Take 1 tablet (4 mg total) by mouth at bedtime. 01/29/21 03/07/21  Camillia Herter, NP  hydrALAZINE (APRESOLINE) 100 MG tablet Take 1 tablet by mouth three times daily 01/29/21   Camillia Herter, NP  hydrOXYzine (ATARAX/VISTARIL) 25 MG tablet Take 1 tablet (25 mg total) by mouth 3 (three) times daily as needed. 08/14/20   Charlott Rakes, MD  Insulin Glargine (BASAGLAR KWIKPEN) 100 UNIT/ML Inject 25 Units into the skin daily. 01/29/21 02/28/21  Camillia Herter, NP  Insulin Pen Needle 31G X 6 MM MISC Use as directed 2 (two) times daily. 01/29/21   Camillia Herter, NP  isosorbide mononitrate (IMDUR) 30 MG 24 hr tablet Take by mouth. 05/14/20   [provider]  liraglutide (VICTOZA) 18 MG/3ML SOPN Inject 1.8 mg into the skin daily. 01/29/21 02/28/21  Camillia Herter, NP  losartan (COZAAR) 25 MG tablet Take 1 tablet (25 mg total) by mouth daily. 01/29/21 03/07/21  Camillia Herter, NP  Misc. Devices MISC Please provide patient with insurance approved CPAP with the following settings:  autopap 15-20.  Large size [employer] Full Face Mask Forma mask and heated humidification. Patient taking differently: Please provide patient with insurance approved CPAP with the following settings:  autopap 15-20.  Large size [employer] Full Face Mask Forma mask and heated humidification. 12/25/19   [name], NP  nitroGLYCERIN (NITROSTAT) 0.4 MG SL tablet Place 1 tablet (0.4 mg total) under the tongue every 5 (five) minutes as needed for chest pain. 08/14/20   Charlott Rakes, MD  spironolactone (ALDACTONE) 50 MG tablet Take 1 tablet (50 mg total) by mouth daily. 01/29/21  03/07/21  Camillia Herter, NP  tamsulosin [facility]) 0.4 MG CAPS capsule Take 1 capsule by mouth daily 10/30/20 03/07/21      Allergies    Aspirin and Nitroglycerin  Review of Systems   Review of Systems  Musculoskeletal:  Positive for back pain.  All other systems reviewed and are negative.  Physical Exam Updated Vital Signs BP (!) 160/101 (BP Location: Right Arm)   Pulse 83   Temp 98.3 F (36.8 C) (Oral)   Resp 18   Ht '5\' 10"'$  (1.778 m)   Wt 113 kg   SpO2 100%   BMI 35.74 kg/m   Physical Exam Vitals and nursing note reviewed.  Constitutional:      General: He is not in acute distress.  Appearance: He is well-developed. He is not diaphoretic.  HENT:     Head: Normocephalic and atraumatic.  Cardiovascular:     Rate and Rhythm: Normal rate and regular rhythm.     Heart sounds: No murmur heard.   No friction rub.  Pulmonary:     Effort: Pulmonary effort is normal. No respiratory distress.     Breath sounds: Normal breath sounds. No wheezing or rales.  Abdominal:     General: Bowel sounds are normal. There is no distension.     Palpations: Abdomen is soft.     Tenderness: There is no abdominal tenderness.  Musculoskeletal:        General: Normal range of motion.     Cervical back: Normal range of motion and neck supple.     Comments: There is tenderness to palpation in the soft tissues of the lumbar region.  There is no bony tenderness or step-off.  Skin:    General: Skin is warm and dry.  Neurological:     Mental Status: He is alert and oriented to person, place, and time.     Coordination: Coordination normal.     Comments: Strength is 5 out of 5 in both lower extremities.  DTRs are 1+ and symmetrical in the patellar and Achilles tendons bilaterally.  He is able to ambulate, however slowly secondary to pain.    ED Results / Procedures / Treatments   Labs (all labs ordered are listed, but only abnormal results are displayed) Labs Reviewed - No data to  display  EKG None  Radiology No results found.  Procedures Procedures   Medications Ordered in ED Medications  HYDROmorphone (DILAUDID) injection 2 mg (has no administration in time range)    ED Course  I have reviewed the triage vital signs and the nursing notes.  Pertinent labs & imaging results that were available during my care of the patient were reviewed by me and considered in my medical decision making (see chart for details).    MDM Rules/Calculators/A&P  Patient presenting here with complaints of low back pain as described in the HPI.  Patient has symmetrical reflexes and equal and normal strength.  He has no bowel or bladder complaints or other red flags that would suggest an emergent situation.  Patient to be treated with prednisone, pain medication, and follow-up as needed if not improving.  Final Clinical Impression(s) / ED Diagnoses Final diagnoses:  None    Rx / DC Orders ED Discharge Orders     None        Veryl Speak, MD 02/13/21 [phone]

## 2021-02-13 NOTE — Telephone Encounter (Signed)
-----   Message from Camillia Herter, NP sent at 01/30/2021  7:51 AM EDT ----- Thyroid function normal.   Kidney function decreased since 8 weeks ago. Reminder to keep appointment scheduled with Nephrology in October 2022.   Calcium lower than normal. Increase calcium-rich foods in the diet such as milk, yogurt, and green leafy vegetables.  Cholesterol remaining higher than normal. Increase Atorvastatin to 80 mg daily. Encouraged to recheck cholesterol at lab only appointment in 6 to 8 weeks.   Diabetes discussed in office.

## 2021-02-18 ENCOUNTER — Encounter: Payer: Self-pay | Admitting: Cardiovascular Disease

## 2021-02-26 ENCOUNTER — Ambulatory Visit (HOSPITAL_COMMUNITY): Payer: Self-pay | Admitting: Psychiatry

## 2021-03-05 ENCOUNTER — Ambulatory Visit (HOSPITAL_COMMUNITY): Payer: Self-pay | Admitting: Psychiatry

## 2021-03-06 ENCOUNTER — Other Ambulatory Visit: Payer: Self-pay | Admitting: Nurse Practitioner

## 2021-03-06 ENCOUNTER — Other Ambulatory Visit: Payer: Self-pay

## 2021-03-06 DIAGNOSIS — J209 Acute bronchitis, unspecified: Secondary | ICD-10-CM

## 2021-03-06 DIAGNOSIS — J440 Chronic obstructive pulmonary disease with acute lower respiratory infection: Secondary | ICD-10-CM

## 2021-03-07 ENCOUNTER — Other Ambulatory Visit: Payer: Self-pay

## 2021-03-07 NOTE — Telephone Encounter (Signed)
Requested medications are due for refill today.  unknown  Requested medications are on the active medications list.  no  Last refill. 11/08/2019  Future visit scheduled.   no  Notes to clinic.  PCP listed as Durene Fruits.

## 2021-03-09 MED ORDER — BUDESONIDE-FORMOTEROL FUMARATE 160-4.5 MCG/ACT IN AERO
2.0000 | INHALATION_SPRAY | Freq: Two times a day (BID) | RESPIRATORY_TRACT | 2 refills | Status: DC
  Filled 2021-03-09: qty 10.2, 30d supply, fill #0

## 2021-03-09 NOTE — Telephone Encounter (Signed)
Budesonide-Formoterol (Symbicort) refilled per patient request. Please schedule appointment for additional refills.

## 2021-03-11 ENCOUNTER — Other Ambulatory Visit: Payer: Self-pay

## 2021-03-14 ENCOUNTER — Other Ambulatory Visit: Payer: Self-pay

## 2021-03-18 ENCOUNTER — Other Ambulatory Visit: Payer: Self-pay

## 2021-03-18 ENCOUNTER — Ambulatory Visit: Payer: Self-pay

## 2021-03-18 VITALS — BP 120/76 | HR 71 | Temp 97.7°F | Resp 16

## 2021-03-18 DIAGNOSIS — I10 Essential (primary) hypertension: Secondary | ICD-10-CM

## 2021-03-18 NOTE — Progress Notes (Signed)
Patient came in for blood pressure check . Patient blood pressure was good but pt c/o blurred vision. Patient was advise that he can go to UC or he may wait to see if NP can see him. Patient said that he would go home and see if he feels better.

## 2021-03-19 ENCOUNTER — Other Ambulatory Visit: Payer: Self-pay

## 2021-03-19 MED ORDER — ISOSORBIDE MONONITRATE ER 30 MG PO TB24
ORAL_TABLET | ORAL | 3 refills | Status: DC
  Filled 2021-03-19: qty 30, 30d supply, fill #0

## 2021-03-20 ENCOUNTER — Other Ambulatory Visit: Payer: Self-pay

## 2021-03-20 MED ORDER — CALCITRIOL 0.25 MCG PO CAPS
ORAL_CAPSULE | ORAL | 3 refills | Status: DC
  Filled 2021-03-20: qty 30, 30d supply, fill #0

## 2021-03-26 ENCOUNTER — Other Ambulatory Visit: Payer: Self-pay

## 2021-03-27 ENCOUNTER — Other Ambulatory Visit: Payer: Self-pay

## 2021-04-17 ENCOUNTER — Telehealth: Payer: Self-pay

## 2021-04-17 NOTE — Telephone Encounter (Signed)
Call received from [facility]. She explained that the CPAP machine is ready for delivery.  This CM paid $100 program fee for patient with [facility] funds. Alice confirmed that the machine will be delivered to [facility].

## 2021-05-07 ENCOUNTER — Inpatient Hospital Stay (HOSPITAL_BASED_OUTPATIENT_CLINIC_OR_DEPARTMENT_OTHER)
Admission: EM | Admit: 2021-05-07 | Discharge: 2021-05-10 | DRG: 683 | Disposition: A | Discharge status: Home / Self Care | Payer: Medicaid Other | Attending: Internal Medicine | Admitting: Internal Medicine

## 2021-05-07 ENCOUNTER — Other Ambulatory Visit: Payer: Self-pay

## 2021-05-07 ENCOUNTER — Encounter (HOSPITAL_BASED_OUTPATIENT_CLINIC_OR_DEPARTMENT_OTHER): Payer: Self-pay

## 2021-05-07 DIAGNOSIS — N185 Chronic kidney disease, stage 5: Secondary | ICD-10-CM | POA: Diagnosis present

## 2021-05-07 DIAGNOSIS — Y636 Underdosing and nonadministration of necessary drug, medicament or biological substance: Secondary | ICD-10-CM | POA: Diagnosis present

## 2021-05-07 DIAGNOSIS — Z886 Allergy status to analgesic agent status: Secondary | ICD-10-CM

## 2021-05-07 DIAGNOSIS — N183 Chronic kidney disease, stage 3 unspecified: Secondary | ICD-10-CM

## 2021-05-07 DIAGNOSIS — I77810 Thoracic aortic ectasia: Secondary | ICD-10-CM | POA: Diagnosis present

## 2021-05-07 DIAGNOSIS — E8809 Other disorders of plasma-protein metabolism, not elsewhere classified: Secondary | ICD-10-CM | POA: Diagnosis present

## 2021-05-07 DIAGNOSIS — E860 Dehydration: Secondary | ICD-10-CM | POA: Diagnosis present

## 2021-05-07 DIAGNOSIS — Z20822 Contact with and (suspected) exposure to covid-19: Secondary | ICD-10-CM | POA: Diagnosis present

## 2021-05-07 DIAGNOSIS — I160 Hypertensive urgency: Secondary | ICD-10-CM | POA: Diagnosis present

## 2021-05-07 DIAGNOSIS — J440 Chronic obstructive pulmonary disease with acute lower respiratory infection: Secondary | ICD-10-CM

## 2021-05-07 DIAGNOSIS — E785 Hyperlipidemia, unspecified: Secondary | ICD-10-CM | POA: Diagnosis present

## 2021-05-07 DIAGNOSIS — J449 Chronic obstructive pulmonary disease, unspecified: Secondary | ICD-10-CM | POA: Diagnosis present

## 2021-05-07 DIAGNOSIS — G4733 Obstructive sleep apnea (adult) (pediatric): Secondary | ICD-10-CM | POA: Diagnosis present

## 2021-05-07 DIAGNOSIS — R739 Hyperglycemia, unspecified: Secondary | ICD-10-CM

## 2021-05-07 DIAGNOSIS — N179 Acute kidney failure, unspecified: Principal | ICD-10-CM | POA: Diagnosis present

## 2021-05-07 DIAGNOSIS — I132 Hypertensive heart and chronic kidney disease with heart failure and with stage 5 chronic kidney disease, or end stage renal disease: Secondary | ICD-10-CM | POA: Diagnosis present

## 2021-05-07 DIAGNOSIS — Z7951 Long term (current) use of inhaled steroids: Secondary | ICD-10-CM

## 2021-05-07 DIAGNOSIS — J209 Acute bronchitis, unspecified: Secondary | ICD-10-CM

## 2021-05-07 DIAGNOSIS — E1122 Type 2 diabetes mellitus with diabetic chronic kidney disease: Secondary | ICD-10-CM | POA: Diagnosis present

## 2021-05-07 DIAGNOSIS — Z794 Long term (current) use of insulin: Secondary | ICD-10-CM

## 2021-05-07 DIAGNOSIS — Z79899 Other long term (current) drug therapy: Secondary | ICD-10-CM | POA: Diagnosis not present

## 2021-05-07 DIAGNOSIS — E871 Hypo-osmolality and hyponatremia: Secondary | ICD-10-CM | POA: Diagnosis present

## 2021-05-07 DIAGNOSIS — Z87891 Personal history of nicotine dependence: Secondary | ICD-10-CM

## 2021-05-07 DIAGNOSIS — Z888 Allergy status to other drugs, medicaments and biological substances status: Secondary | ICD-10-CM

## 2021-05-07 DIAGNOSIS — E782 Mixed hyperlipidemia: Secondary | ICD-10-CM

## 2021-05-07 DIAGNOSIS — N189 Chronic kidney disease, unspecified: Secondary | ICD-10-CM | POA: Diagnosis present

## 2021-05-07 DIAGNOSIS — I252 Old myocardial infarction: Secondary | ICD-10-CM

## 2021-05-07 DIAGNOSIS — I5042 Chronic combined systolic (congestive) and diastolic (congestive) heart failure: Secondary | ICD-10-CM | POA: Diagnosis present

## 2021-05-07 DIAGNOSIS — Z833 Family history of diabetes mellitus: Secondary | ICD-10-CM

## 2021-05-07 DIAGNOSIS — Z6835 Body mass index (BMI) 35.0-35.9, adult: Secondary | ICD-10-CM

## 2021-05-07 DIAGNOSIS — Z7952 Long term (current) use of systemic steroids: Secondary | ICD-10-CM | POA: Diagnosis not present

## 2021-05-07 DIAGNOSIS — Z825 Family history of asthma and other chronic lower respiratory diseases: Secondary | ICD-10-CM

## 2021-05-07 DIAGNOSIS — I214 Non-ST elevation (NSTEMI) myocardial infarction: Secondary | ICD-10-CM

## 2021-05-07 DIAGNOSIS — E861 Hypovolemia: Secondary | ICD-10-CM | POA: Diagnosis present

## 2021-05-07 DIAGNOSIS — Z8249 Family history of ischemic heart disease and other diseases of the circulatory system: Secondary | ICD-10-CM | POA: Diagnosis not present

## 2021-05-07 DIAGNOSIS — I10 Essential (primary) hypertension: Secondary | ICD-10-CM

## 2021-05-07 DIAGNOSIS — T383X6A Underdosing of insulin and oral hypoglycemic [antidiabetic] drugs, initial encounter: Secondary | ICD-10-CM | POA: Diagnosis present

## 2021-05-07 DIAGNOSIS — E876 Hypokalemia: Secondary | ICD-10-CM | POA: Diagnosis not present

## 2021-05-07 DIAGNOSIS — R7989 Other specified abnormal findings of blood chemistry: Secondary | ICD-10-CM | POA: Diagnosis present

## 2021-05-07 DIAGNOSIS — Z91199 Patient's noncompliance with other medical treatment and regimen due to unspecified reason: Secondary | ICD-10-CM

## 2021-05-07 DIAGNOSIS — E1165 Type 2 diabetes mellitus with hyperglycemia: Secondary | ICD-10-CM | POA: Diagnosis present

## 2021-05-07 DIAGNOSIS — T465X6A Underdosing of other antihypertensive drugs, initial encounter: Secondary | ICD-10-CM | POA: Diagnosis present

## 2021-05-07 LAB — BASIC METABOLIC PANEL
Anion gap: 14 (ref 5–15)
Anion gap: 10 (ref 5–15)
BUN: 62 mg/dL — ABNORMAL HIGH (ref 6–20)
BUN: 62 mg/dL — ABNORMAL HIGH (ref 6–20)
CO2: 26 mmol/L (ref 22–32)
CO2: 25 mmol/L (ref 22–32)
Calcium: 6.2 mg/dL — CL (ref 8.9–10.3)
Calcium: 6 mg/dL — CL (ref 8.9–10.3)
Chloride: 88 mmol/L — ABNORMAL LOW (ref 98–111)
Chloride: 101 mmol/L (ref 98–111)
Creatinine, Ser: 6.57 mg/dL — ABNORMAL HIGH (ref 0.61–1.24)
Creatinine, Ser: 6.36 mg/dL — ABNORMAL HIGH (ref 0.61–1.24)
GFR, Estimated: 10 mL/min — ABNORMAL LOW (ref >60)
GFR, Estimated: 10 mL/min — ABNORMAL LOW (ref >60)
Glucose, Bld: 545 mg/dL (ref 70–99)
Glucose, Bld: 159 mg/dL — ABNORMAL HIGH (ref 70–99)
Potassium: 2.7 mmol/L — CL (ref 3.5–5.1)
Potassium: 2.6 mmol/L — CL (ref 3.5–5.1)
Sodium: 128 mmol/L — ABNORMAL LOW (ref 135–145)
Sodium: 136 mmol/L (ref 135–145)

## 2021-05-07 LAB — URINALYSIS, ROUTINE W REFLEX MICROSCOPIC
Bilirubin Urine: NEGATIVE
Glucose, UA: >1000 mg/dL — AB
Ketones, ur: NEGATIVE mg/dL
Leukocytes,Ua: NEGATIVE
Nitrite: NEGATIVE
Protein, ur: >300 mg/dL — AB
Specific Gravity, Urine: 1.014 (ref 1.005–1.030)
pH: 6.5 (ref 5.0–8.0)

## 2021-05-07 LAB — CBC
HCT: 42.2 % (ref 39.0–52.0)
Hemoglobin: 14.1 g/dL (ref 13.0–17.0)
MCH: 26.4 pg (ref 26.0–34.0)
MCHC: 33.4 g/dL (ref 30.0–36.0)
MCV: 79 fL — ABNORMAL LOW (ref 80.0–100.0)
Platelets: 304 10*3/uL (ref 150–400)
RBC: 5.34 MIL/uL (ref 4.22–5.81)
RDW: 13.3 % (ref 11.5–15.5)
WBC: 7.8 10*3/uL (ref 4.0–10.5)
nRBC: 0 % (ref 0.0–0.2)

## 2021-05-07 LAB — MRSA NEXT GEN BY PCR, NASAL: MRSA by PCR Next Gen: NOT DETECTED

## 2021-05-07 LAB — RESP PANEL BY RT-PCR (FLU A&B, COVID) ARPGX2
Influenza A by PCR: NEGATIVE
Influenza B by PCR: NEGATIVE
SARS Coronavirus 2 by RT PCR: NEGATIVE

## 2021-05-07 LAB — GLUCOSE, CAPILLARY
Glucose-Capillary: 75 mg/dL (ref 70–99)
Glucose-Capillary: 84 mg/dL (ref 70–99)
Glucose-Capillary: 198 mg/dL — ABNORMAL HIGH (ref 70–99)
Glucose-Capillary: 154 mg/dL — ABNORMAL HIGH (ref 70–99)

## 2021-05-07 LAB — CBG MONITORING, ED
Glucose-Capillary: 544 mg/dL (ref 70–99)
Glucose-Capillary: 342 mg/dL — ABNORMAL HIGH (ref 70–99)
Glucose-Capillary: 221 mg/dL — ABNORMAL HIGH (ref 70–99)

## 2021-05-07 LAB — MAGNESIUM: Magnesium: 1.4 mg/dL — ABNORMAL LOW (ref 1.7–2.4)

## 2021-05-07 MED ORDER — DOXAZOSIN MESYLATE 8 MG PO TABS
8.0000 mg | ORAL_TABLET | Freq: Every day | ORAL | Status: DC
  Administered 2021-05-08 – 2021-05-10 (×3): 8 mg via ORAL
  Filled 2021-05-07: qty 1
  Filled 2021-05-07: qty 8
  Filled 2021-05-07 (×2): qty 1

## 2021-05-07 MED ORDER — ENOXAPARIN SODIUM 40 MG/0.4ML IJ SOSY
40.0000 mg | PREFILLED_SYRINGE | INTRAMUSCULAR | Status: DC
  Administered 2021-05-07: 40 mg via SUBCUTANEOUS
  Filled 2021-05-07: qty 0.4

## 2021-05-07 MED ORDER — SODIUM CHLORIDE 0.9 % IV SOLN: 1.0000 g | Freq: Once | INTRAVENOUS | Status: DC

## 2021-05-07 MED ORDER — SODIUM CHLORIDE 0.9 % IV SOLN: INTRAVENOUS | Status: DC | PRN

## 2021-05-07 MED ORDER — DEXTROSE IN LACTATED RINGERS 5 % IV SOLN: INTRAVENOUS | Status: DC

## 2021-05-07 MED ORDER — CHLORHEXIDINE GLUCONATE CLOTH 2 % EX PADS
6.0000 | MEDICATED_PAD | Freq: Every day | CUTANEOUS | Status: DC
  Administered 2021-05-08: 6 via TOPICAL

## 2021-05-07 MED ORDER — HYDRALAZINE HCL 50 MG PO TABS
100.0000 mg | ORAL_TABLET | Freq: Three times a day (TID) | ORAL | Status: DC
  Administered 2021-05-07 – 2021-05-10 (×8): 100 mg via ORAL
  Filled 2021-05-07 (×8): qty 2

## 2021-05-07 MED ORDER — LOSARTAN POTASSIUM 50 MG PO TABS
25.0000 mg | ORAL_TABLET | Freq: Every day | ORAL | Status: DC
  Administered 2021-05-07: 25 mg via ORAL
  Filled 2021-05-07: qty 1

## 2021-05-07 MED ORDER — INSULIN ASPART 100 UNIT/ML IJ SOLN
0.0000 [IU] | Freq: Three times a day (TID) | INTRAMUSCULAR | Status: DC
  Administered 2021-05-08: 3 [IU] via SUBCUTANEOUS
  Administered 2021-05-08: 2 [IU] via SUBCUTANEOUS
  Administered 2021-05-08: 11 [IU] via SUBCUTANEOUS
  Administered 2021-05-08: 8 [IU] via SUBCUTANEOUS
  Administered 2021-05-08: 5 [IU] via SUBCUTANEOUS
  Administered 2021-05-09: 11 [IU] via SUBCUTANEOUS
  Administered 2021-05-09: 15 [IU] via SUBCUTANEOUS
  Administered 2021-05-09: 5 [IU] via SUBCUTANEOUS
  Administered 2021-05-09: 3 [IU] via SUBCUTANEOUS
  Administered 2021-05-09: 5 [IU] via SUBCUTANEOUS
  Administered 2021-05-10: 8 [IU] via SUBCUTANEOUS

## 2021-05-07 MED ORDER — CALCIUM GLUCONATE-NACL 1-0.675 GM/50ML-% IV SOLN
1.0000 g | Freq: Once | INTRAVENOUS | Status: AC
  Administered 2021-05-07: 1000 mg via INTRAVENOUS
  Filled 2021-05-07: qty 50

## 2021-05-07 MED ORDER — SODIUM CHLORIDE 0.9 % IV BOLUS
2000.0000 mL | Freq: Once | INTRAVENOUS | Status: AC
  Administered 2021-05-07: 2000 mL via INTRAVENOUS

## 2021-05-07 MED ORDER — LABETALOL HCL 5 MG/ML IV SOLN
20.0000 mg | Freq: Once | INTRAVENOUS | Status: AC
Start: 2021-05-07 — End: 2021-05-07
  Administered 2021-05-07: 20 mg via INTRAVENOUS
  Filled 2021-05-07: qty 4

## 2021-05-07 MED ORDER — AMLODIPINE BESYLATE 10 MG PO TABS
10.0000 mg | ORAL_TABLET | Freq: Every day | ORAL | Status: DC
  Administered 2021-05-07 – 2021-05-10 (×4): 10 mg via ORAL
  Filled 2021-05-07 (×2): qty 1
  Filled 2021-05-07: qty 2
  Filled 2021-05-07: qty 1

## 2021-05-07 MED ORDER — LACTATED RINGERS IV SOLN: INTRAVENOUS | Status: DC

## 2021-05-07 MED ORDER — CARVEDILOL 25 MG PO TABS
25.0000 mg | ORAL_TABLET | Freq: Two times a day (BID) | ORAL | Status: DC
  Administered 2021-05-07 – 2021-05-10 (×6): 25 mg via ORAL
  Filled 2021-05-07 (×2): qty 2
  Filled 2021-05-07: qty 1
  Filled 2021-05-07: qty 2
  Filled 2021-05-07: qty 1
  Filled 2021-05-07: qty 2

## 2021-05-07 MED ORDER — POTASSIUM CHLORIDE 10 MEQ/100ML IV SOLN
10.0000 meq | Freq: Once | INTRAVENOUS | Status: AC
  Administered 2021-05-07: 10 meq via INTRAVENOUS
  Filled 2021-05-07: qty 100

## 2021-05-07 MED ORDER — POTASSIUM CHLORIDE 10 MEQ/100ML IV SOLN
10.0000 meq | INTRAVENOUS | Status: AC
  Administered 2021-05-07 – 2021-05-08 (×4): 10 meq via INTRAVENOUS
  Filled 2021-05-07 (×4): qty 100

## 2021-05-07 MED ORDER — INSULIN REGULAR(HUMAN) IN NACL 100-0.9 UT/100ML-% IV SOLN
INTRAVENOUS | Status: DC
  Administered 2021-05-07: 11.5 [IU]/h via INTRAVENOUS
  Filled 2021-05-07: qty 100

## 2021-05-07 MED ORDER — CALCIUM CARBONATE 1250 (500 CA) MG PO TABS
1.0000 | ORAL_TABLET | Freq: Two times a day (BID) | ORAL | Status: AC
  Administered 2021-05-07 – 2021-05-08 (×2): 500 mg via ORAL
  Filled 2021-05-07 (×2): qty 1

## 2021-05-07 MED ORDER — POTASSIUM CHLORIDE CRYS ER 20 MEQ PO TBCR
40.0000 meq | EXTENDED_RELEASE_TABLET | Freq: Once | ORAL | Status: AC
  Administered 2021-05-07: 40 meq via ORAL
  Filled 2021-05-07: qty 2

## 2021-05-07 MED ORDER — DEXTROSE 50 % IV SOLN: 0.0000 mL | INTRAVENOUS | Status: DC | PRN

## 2021-05-07 MED ORDER — MAGNESIUM SULFATE 2 GM/50ML IV SOLN
2.0000 g | Freq: Once | INTRAVENOUS | Status: AC
  Administered 2021-05-07: 2 g via INTRAVENOUS
  Filled 2021-05-07: qty 50

## 2021-05-07 NOTE — ED Notes (Signed)
RT Note: Pt. has Sleep Apnea that Spouse confirmed but is unable to obtain device. RT in room to check on <<RR/<<Sats/=2 lpm n/c.

## 2021-05-07 NOTE — ED Notes (Signed)
[facility] here to take pt  to [facility]

## 2021-05-07 NOTE — Progress Notes (Signed)
Endotool/insulin gtt on hold per Dr. Flossie Buffy (on unit) - pt CBG on arrival to unit was 75 - up to 84 x30 minutes later.  Per wife, pt irritable, not at baseline, possible due to CBG low; Order to check CBG q1h - next due at 2130.

## 2021-05-07 NOTE — ED Triage Notes (Signed)
Patient here POV from Home with Hyperglycemia.  Patient has been having difficulty controlling Glucose this past week. CBG 517 today. Patient given 20 Units of Basaglar and 1.8 Units of Victoza approximately 1 hour PTA.  Patient has been having Polydipsia and Fatigue more recently today.  NAD Noted during Triage. A&Ox4. GCS 15.

## 2021-05-07 NOTE — ED Provider Notes (Signed)
[facility] EMERGENC[facility] DEPT Provider Note   CSN: [id number] Arrival date & time: 05/07/21  1359     History  Chief Complaint  Patient presents with   Hyperglycemia    Matthew Odom is a 51 y.o. male.  51 yo M with a cc of hyperglycemia.  Having polyphagia, dypsea, uria for a couple weeks now.  Hasnt checked his blood sugar, but claims compliance to medications.  Checked today >500.  Has been more sleepy than normal per family.  Having trouble sleeping due to severe sleep apnea, wakes up in a panic feeling like he cant breathe multiple times a night.  Denies cough, congestion, fever.  Denies chest pain, shortness of breath.    The history is provided by the patient and the spouse.  Hyperglycemia Associated symptoms: fatigue, increased thirst and polyuria   Associated symptoms: no abdominal pain, no chest pain, no confusion, no fever, no shortness of breath and no vomiting   Illness Severity:  Moderate Onset quality:  Gradual Duration:  2 weeks Timing:  Constant Progression:  Worsening Chronicity:  New Associated symptoms: fatigue   Associated symptoms: no abdominal pain, no chest pain, no congestion, no diarrhea, no fever, no headaches, no myalgias, no rash, no shortness of breath and no vomiting       Home Medications Prior to Admission medications   Medication Sig Start Date End Date Taking? Authorizing Provider  albuterol (VENTOLIN HFA) 108 (90 Base) MCG/ACT inhaler Inhale 2 puffs into the lungs every 6 (six) hours as needed for wheezing or shortness of breath (or cough). ONLY AS NEEDED. Not meant for every day use 11/08/19   [name], NP  ALPRAZolam Duanne Moron) 0.25 MG tablet Take by mouth. 09/25/19   [provider]  amLODipine (NORVASC) 10 MG tablet Take 1 tablet (10 mg total) by mouth daily. 01/29/21 03/07/21  Camillia Herter, NP  atorvastatin (LIPITOR) 80 MG tablet Take 1 tablet (80 mg total) by mouth daily. 01/30/21 07/29/21  Camillia Herter, NP   budesonide-formoterol (SYMBICORT) 160-4.5 MCG/ACT inhaler Inhale 2 puffs into the lungs 2 (two) times daily. 03/09/21   Camillia Herter, NP  calcitRIOL (ROCALTROL) 0.25 MCG capsule Take 1 capsule by mouth daily. 03/20/21     carvedilol (COREG) 25 MG tablet Take 1 tablet (25 mg total) by mouth 2 (two) times daily with a meal. 01/29/21 03/07/21  Camillia Herter, NP  chlorhexidine (PERIDEX) 0.12 % solution USE AS DIRECTED 15 MLS IN THE MOUTH OR THROAT 2 (TWO) TIMES DAILY. 05/09/20 05/09/21  [name], NP  ciclopirox (PENLAC) 8 % solution APPLY TOPICALLY AT BEDTIME. APPLY TO AFFECTED TOENAILS ONCE DAILY FOR 48 WEEKS. REMOVE ONCE WEEKLY WITH NAIL POLISH REMOVER. 07/27/20 07/27/21  Marzetta Board, DPM  doxazosin (CARDURA) 4 MG tablet Take 1 tablet (4 mg total) by mouth at bedtime. 01/29/21 03/07/21  Camillia Herter, NP  furosemide (LASIX) 40 MG tablet Take 1 tablet by mouth daily for 3 days then take as needed for weight gain or swelling thereafter as directed, 02/13/21     hydrALAZINE (APRESOLINE) 100 MG tablet Take 1 tablet by mouth three times daily 01/29/21   Camillia Herter, NP  HYDROcodone-acetaminophen (NORCO) 5-325 MG tablet Take 1-2 tablets by mouth every 6 (six) hours as needed. 02/13/21   Veryl Speak, MD  hydrOXYzine (ATARAX/VISTARIL) 25 MG tablet Take 1 tablet (25 mg total) by mouth 3 (three) times daily as needed. 08/14/20   Charlott Rakes, MD  Insulin  Glargine (BASAGLAR KWIKPEN) 100 UNIT/ML Inject 25 Units into the skin daily. 01/29/21 02/28/21  Camillia Herter, NP  Insulin Pen Needle 31G X 6 MM MISC Use as directed 2 (two) times daily. 01/29/21   Camillia Herter, NP  isosorbide mononitrate (IMDUR) 30 MG 24 hr tablet Take by mouth. 05/14/20   [provider]  isosorbide mononitrate (IMDUR) 30 MG 24 hr tablet Take 1 tablet by mouth nightly at bedtime. 03/19/21     liraglutide (VICTOZA) 18 MG/3ML SOPN Inject 1.8 mg into the skin daily. 01/29/21 02/28/21  Camillia Herter, NP  losartan  (COZAAR) 25 MG tablet Take 1 tablet (25 mg total) by mouth daily. 01/29/21 03/07/21  Camillia Herter, NP  Misc. Devices MISC Please provide patient with insurance approved CPAP with the following settings:  autopap 15-20.  Large size [employer] Full Face Mask Forma mask and heated humidification. Patient taking differently: Please provide patient with insurance approved CPAP with the following settings:  autopap 15-20.  Large size [employer] Full Face Mask Forma mask and heated humidification. 12/25/19   [name], NP  nitroGLYCERIN (NITROSTAT) 0.4 MG SL tablet Place 1 tablet (0.4 mg total) under the tongue every 5 (five) minutes as needed for chest pain. 08/14/20   Charlott Rakes, MD  predniSONE (DELTASONE) 10 MG tablet Take 2 tablets (20 mg total) by mouth 2 (two) times daily with a meal. 02/13/21   Veryl Speak, MD  spironolactone (ALDACTONE) 50 MG tablet Take 1 tablet (50 mg total) by mouth daily. 01/29/21 03/07/21  Camillia Herter, NP      Allergies    Aspirin and Nitroglycerin    Review of Systems   Review of Systems  Constitutional:  Positive for fatigue. Negative for chills and fever.  HENT:  Negative for congestion and facial swelling.   Eyes:  Negative for discharge and visual disturbance.  Respiratory:  Negative for shortness of breath.   Cardiovascular:  Negative for chest pain and palpitations.  Gastrointestinal:  Negative for abdominal pain, diarrhea and vomiting.  Endocrine: Positive for polydipsia, polyphagia and polyuria.  Musculoskeletal:  Negative for arthralgias and myalgias.  Skin:  Negative for color change and rash.  Neurological:  Negative for tremors, syncope and headaches.  Psychiatric/Behavioral:  Negative for confusion and dysphoric mood.    Physical Exam Updated Vital Signs BP (!) 204/146    Pulse 89    Temp 97.9 F (36.6 C)    Resp (!) 22    Ht 5\' 10"  (1.778 m)    Wt 113.4 kg    SpO2 100%    BMI 35.87 kg/m  Physical Exam Vitals and nursing note  reviewed.  Constitutional:      Appearance: He is well-developed.     Comments: BMI 35  HENT:     Head: Normocephalic and atraumatic.  Eyes:     Pupils: Pupils are equal, round, and reactive to light.  Neck:     Vascular: No JVD.  Cardiovascular:     Rate and Rhythm: Normal rate and regular rhythm.     Heart sounds: No murmur heard.   No friction rub. No gallop.  Pulmonary:     Effort: No respiratory distress.     Breath sounds: No wheezing.  Abdominal:     General: There is no distension.     Tenderness: There is no abdominal tenderness. There is no guarding or rebound.  Musculoskeletal:        General: Normal range of motion.  Cervical back: Normal range of motion and neck supple.  Skin:    Coloration: Skin is not pale.     Findings: No rash.  Neurological:     Mental Status: He is alert and oriented to person, place, and time.  Psychiatric:        Behavior: Behavior normal.    ED Results / Procedures / Treatments   Labs (all labs ordered are listed, but only abnormal results are displayed) Labs Reviewed  BASIC METABOLIC PANEL - Abnormal; Notable for the following components:      Result Value   Sodium 128 (*)    Potassium 2.7 (*)    Chloride 88 (*)    Glucose, Bld 545 (*)    BUN 62 (*)    Creatinine, Ser 6.57 (*)    Calcium 6.2 (*)    GFR, Estimated 10 (*)    All other components within normal limits  CBC - Abnormal; Notable for the following components:   MCV 79.0 (*)    All other components within normal limits  URINALYSIS, ROUTINE W REFLEX MICROSCOPIC - Abnormal; Notable for the following components:   Glucose, UA >1,000 (*)    Hgb urine dipstick MODERATE (*)    Protein, ur >300 (*)    Bacteria, UA RARE (*)    All other components within normal limits  CBG MONITORING, ED - Abnormal; Notable for the following components:   Glucose-Capillary 544 (*)    All other components within normal limits  RESP PANEL BY RT-PCR (FLU A&B, COVID) ARPGX2  MAGNESIUM   BASIC METABOLIC PANEL  BASIC METABOLIC PANEL  BASIC METABOLIC PANEL  CBG MONITORING, ED    EKG None  Radiology No results found.  Procedures Procedures    Medications Ordered in ED Medications  calcium gluconate 1 g/ 50 mL sodium chloride IVPB (has no administration in time range)  insulin regular, human (MYXREDLIN) 100 units/ 100 mL infusion (has no administration in time range)  lactated ringers infusion (has no administration in time range)  dextrose 5 % in lactated ringers infusion (has no administration in time range)  dextrose 50 % solution 0-50 mL (has no administration in time range)  labetalol (NORMODYNE) injection 20 mg (has no administration in time range)  amLODipine (NORVASC) tablet 10 mg (has no administration in time range)  carvedilol (COREG) tablet 25 mg (has no administration in time range)  losartan (COZAAR) tablet 25 mg (has no administration in time range)  sodium chloride 0.9 % bolus 2,000 mL (2,000 mLs Intravenous New Bag/Given 05/07/21 1525)  potassium chloride SA (KLOR-CON M) CR tablet 40 mEq (40 mEq Oral Given 05/07/21 1610)  potassium chloride 10 mEq in 100 mL IVPB (10 mEq Intravenous New Bag/Given 05/07/21 1604)    ED Course/ Medical Decision Making/ A&P                           Medical Decision Making  51 yo M with a cc of hyperglycemia.  Going on for probably a couple weeks.  AKI, hypokalemia, hypocalcemia.  Having some spasms at home.  Give fluids here, ca, K.  No acidosis or anion gap.    With profound dehydration and AKI discussed case with the hospitalist who agrees with admission.  Will start on insulin infusion.  He is also hypertensive will give a bolus of labetalol here.  Start him on his home medications.  CRITICAL CARE Performed by: Cecilio Asper   Total critical  care time: 35 minutes  Critical care time was exclusive of separately billable procedures and treating other patients.  Critical care was necessary to treat or  prevent imminent or life-threatening deterioration.  Critical care was time spent personally by me on the following activities: development of treatment plan with patient and/or surrogate as well as nursing, discussions with consultants, evaluation of patient's response to treatment, examination of patient, obtaining history from patient or surrogate, ordering and performing treatments and interventions, ordering and review of laboratory studies, ordering and review of radiographic studies, pulse oximetry and re-evaluation of patient's condition.  The patients results and plan were reviewed and discussed.   Any x-rays performed were independently reviewed by myself.   Differential diagnosis were considered with the presenting HPI.  Medications  calcium gluconate 1 g/ 50 mL sodium chloride IVPB (has no administration in time range)  insulin regular, human (MYXREDLIN) 100 units/ 100 mL infusion (has no administration in time range)  lactated ringers infusion (has no administration in time range)  dextrose 5 % in lactated ringers infusion (has no administration in time range)  dextrose 50 % solution 0-50 mL (has no administration in time range)  labetalol (NORMODYNE) injection 20 mg (has no administration in time range)  amLODipine (NORVASC) tablet 10 mg (has no administration in time range)  carvedilol (COREG) tablet 25 mg (has no administration in time range)  losartan (COZAAR) tablet 25 mg (has no administration in time range)  sodium chloride 0.9 % bolus 2,000 mL (2,000 mLs Intravenous New Bag/Given 05/07/21 1525)  potassium chloride SA (KLOR-CON M) CR tablet 40 mEq (40 mEq Oral Given 05/07/21 1610)  potassium chloride 10 mEq in 100 mL IVPB (10 mEq Intravenous New Bag/Given 05/07/21 1604)    Vitals:   05/07/21 1408 05/07/21 1445 05/07/21 1500 05/07/21 1545  BP:  (!) 182/121 (!) 197/134 (!) 204/146  Pulse:  94 81 89  Resp:  14 16 (!) 22  Temp:      SpO2:  (!) 89% 93% 100%  Weight: 113.4 kg      Height: 5\' 10"  (1.778 m)       Final diagnoses:  AKI (acute kidney injury) ([place])  Hyperglycemia  Hypocalcemia  Hypokalemia    Admission/ observation were discussed with the admitting physician, patient and/or family and they are comfortable with the plan.          Final Clinical Impression(s) / ED Diagnoses Final diagnoses:  AKI (acute kidney injury) ([place])  Hyperglycemia  Hypocalcemia  Hypokalemia    Rx / DC Orders ED Discharge Orders     None         Deno Etienne, DO 05/07/21 1707

## 2021-05-07 NOTE — ED Notes (Signed)
Notified Lil RN of CBG 544 mg/dL

## 2021-05-07 NOTE — H&P (Signed)
History and Physical    Matthew Odom FXT:[id number] DOB: 1970/12/25 DOA: 05/07/2021  PCP: Camillia Herter, NP  Patient coming from: [facility]  I have personally briefly reviewed patient's old medical records in [place]  Chief Complaint: hyperglycemia and AKI  HPI: Matthew Odom is a 51 y.o. male with medical history significant for insulin-dependent type 2 diabetes, hypertension, CKD stage III, combined CHF, and COPD who presents as a transfer from outside ED with concerns of hyperglycemia and AKI.  Patient's wife at bedside provides history as patient is obtunded and arrived to [facility] with BG of 75.  Wife reports that he usually starts feeling poorly once his blood sugar gets below 150-175. Today patient was more lethargic, had dry mouth and complained of leg cramping.  Also had polyuria.  Wife then checked his sugar and found it to be 517 which is the highest she has ever seen.  She then gave him his daily 20 units Basaglar and Victoza injection.  She did checked his glucose about an hour later and it was more elevated up to 557 so she brought him into the ED.  She reports that he has not had his insulin for the past few days.  Also has not been taking his antihypertensives for the past few days as well and has ran out of prescription of some of his medication.  ED Course: He was afebrile, initially hypertensive up to BP of 190/140 with improvement down to 160/107 on arrival here.  No leukocytosis or anemia. Sodium of 128, potassium of 2.7, BG of 554, chloride of 88, CO2 of 26 anion gap of 14.  Calcium of 6.2, magnesium 1.4. UA with glucose but otherwise negative for infection. Negative flu/COVID PCR  He was fluid resuscitated, given 17meq IV K and oral 71meq and 1g of calcium gluconate and started on insulin infusion.  He was given his home amlodipine, Coreg, and Losartan in the ED.   Review of Systems: Unable to obtain given pt lethargy  Past Medical History:   Diagnosis Date   Cocaine use    Diabetes mellitus without complication ([place])    Enlarged heart    History of degenerative disc disease    Hyperlipidemia    Hypertension    NSTEMI (non-ST elevated myocardial infarction) ([place])    Sciatic nerve pain     Past Surgical History:  Procedure Laterality Date   CARDIAC CATHETERIZATION     NO PAST SURGERIES       reports that he has been smoking cigarettes. He has a 40.00 pack-year smoking history. He has never used smokeless tobacco. He reports that he does not currently use alcohol. He reports current drug use. Drug: Cocaine. Social History  Allergies  Allergen Reactions   Aspirin Hives   Nitroglycerin Other (See Comments)    Nitroglycerin paste will make blood pressure go up and cause headaches. The nitroglycerin tablets do fine.    Family History  Problem Relation Age of Onset   Heart attack Mother    Hypertension Mother    Diabetes Mother    Heart disease Sister    Hypertension Sister    Hypertension Father    Emphysema Father      Prior to Admission medications   Medication Sig Start Date End Date Taking? Authorizing Provider  albuterol (VENTOLIN HFA) 108 (90 Base) MCG/ACT inhaler Inhale 2 puffs into the lungs every 6 (six) hours as needed for wheezing or shortness of breath (or cough). ONLY AS  NEEDED. Not meant for every day use 11/08/19   [name], NP  ALPRAZolam Duanne Moron) 0.25 MG tablet Take by mouth. 09/25/19   [provider]  amLODipine (NORVASC) 10 MG tablet Take 1 tablet (10 mg total) by mouth daily. 01/29/21 03/07/21  Camillia Herter, NP  atorvastatin (LIPITOR) 80 MG tablet Take 1 tablet (80 mg total) by mouth daily. 01/30/21 07/29/21  Camillia Herter, NP  budesonide-formoterol (SYMBICORT) 160-4.5 MCG/ACT inhaler Inhale 2 puffs into the lungs 2 (two) times daily. 03/09/21   Camillia Herter, NP  calcitRIOL (ROCALTROL) 0.25 MCG capsule Take 1 capsule by mouth daily. 03/20/21     carvedilol (COREG) 25 MG  tablet Take 1 tablet (25 mg total) by mouth 2 (two) times daily with a meal. 01/29/21 03/07/21  Camillia Herter, NP  chlorhexidine (PERIDEX) 0.12 % solution USE AS DIRECTED 15 MLS IN THE MOUTH OR THROAT 2 (TWO) TIMES DAILY. 05/09/20 05/09/21  [name], NP  ciclopirox (PENLAC) 8 % solution APPLY TOPICALLY AT BEDTIME. APPLY TO AFFECTED TOENAILS ONCE DAILY FOR 48 WEEKS. REMOVE ONCE WEEKLY WITH NAIL POLISH REMOVER. 07/27/20 07/27/21  Marzetta Board, DPM  doxazosin (CARDURA) 4 MG tablet Take 1 tablet (4 mg total) by mouth at bedtime. 01/29/21 03/07/21  Camillia Herter, NP  furosemide (LASIX) 40 MG tablet Take 1 tablet by mouth daily for 3 days then take as needed for weight gain or swelling thereafter as directed, 02/13/21     hydrALAZINE (APRESOLINE) 100 MG tablet Take 1 tablet by mouth three times daily 01/29/21   Camillia Herter, NP  HYDROcodone-acetaminophen (NORCO) 5-325 MG tablet Take 1-2 tablets by mouth every 6 (six) hours as needed. 02/13/21   Veryl Speak, MD  hydrOXYzine (ATARAX/VISTARIL) 25 MG tablet Take 1 tablet (25 mg total) by mouth 3 (three) times daily as needed. 08/14/20   Charlott Rakes, MD  Insulin Glargine (BASAGLAR KWIKPEN) 100 UNIT/ML Inject 25 Units into the skin daily. 01/29/21 02/28/21  Camillia Herter, NP  Insulin Pen Needle 31G X 6 MM MISC Use as directed 2 (two) times daily. 01/29/21   Camillia Herter, NP  isosorbide mononitrate (IMDUR) 30 MG 24 hr tablet Take by mouth. 05/14/20   [provider]  isosorbide mononitrate (IMDUR) 30 MG 24 hr tablet Take 1 tablet by mouth nightly at bedtime. 03/19/21     liraglutide (VICTOZA) 18 MG/3ML SOPN Inject 1.8 mg into the skin daily. 01/29/21 02/28/21  Camillia Herter, NP  losartan (COZAAR) 25 MG tablet Take 1 tablet (25 mg total) by mouth daily. 01/29/21 03/07/21  Camillia Herter, NP  Misc. Devices MISC Please provide patient with insurance approved CPAP with the following settings:  autopap 15-20.  Large size [employer] Full  Face Mask Forma mask and heated humidification. Patient taking differently: Please provide patient with insurance approved CPAP with the following settings:  autopap 15-20.  Large size [employer] Full Face Mask Forma mask and heated humidification. 12/25/19   [name], NP  nitroGLYCERIN (NITROSTAT) 0.4 MG SL tablet Place 1 tablet (0.4 mg total) under the tongue every 5 (five) minutes as needed for chest pain. 08/14/20   Charlott Rakes, MD  predniSONE (DELTASONE) 10 MG tablet Take 2 tablets (20 mg total) by mouth 2 (two) times daily with a meal. 02/13/21   Veryl Speak, MD  spironolactone (ALDACTONE) 50 MG tablet Take 1 tablet (50 mg total) by mouth daily. 01/29/21 03/07/21  Camillia Herter, NP  Physical Exam: Vitals:   05/07/21 1730 05/07/21 1745 05/07/21 1800 05/07/21 2000  BP: (!) 184/131 (!) 208/151 (!) 147/120 (!) 163/107  Pulse: 87 93 78 77  Resp:    11  Temp:      SpO2: 100% 100% 100% 97%  Weight:      Height:        Constitutional: obtunded and lethargic in bed asleep and snoring Vitals:   05/07/21 1730 05/07/21 1745 05/07/21 1800 05/07/21 2000  BP: (!) 184/131 (!) 208/151 (!) 147/120 (!) 163/107  Pulse: 87 93 78 77  Resp:    11  Temp:      SpO2: 100% 100% 100% 97%  Weight:      Height:       Eyes: lids and conjunctivae normal ENMT: Mucous membranes are moist.  Neck: normal, supple Respiratory: clear to auscultation bilaterally, no wheezing, no crackles. Normal respiratory effort. No accessory muscle use.  Cardiovascular: Regular rate and rhythm, no murmurs / rubs / gallops. No extremity edema.  Abdomen: no grimace with palpation of the abdomen  musculoskeletal: no clubbing / cyanosis. No joint deformity upper and lower extremities.Normal muscle tone.  Skin: no rashes, lesions, ulcers.  Neurologic: Lethargic and obtunded.  Attempted to open eyes with noxious stimuli. Psychiatric: Unable to assess due to lethargy  Labs on Admission: I have personally  reviewed following labs and imaging studies  CBC: Recent Labs  Lab 05/07/21 1415  WBC 7.8  HGB 14.1  HCT 42.2  MCV 79.0*  PLT 158   Basic Metabolic Panel: Recent Labs  Lab 05/07/21 1415  NA 128*  K 2.7*  CL 88*  CO2 26  GLUCOSE 545*  BUN 62*  CREATININE 6.57*  CALCIUM 6.2*  MG 1.4*   GFR: Estimated Creatinine Clearance: 17 mL/min (A) (by C-G formula based on SCr of 6.57 mg/dL (H)). Liver Function Tests: No results for input(s): AST, ALT, ALKPHOS, BILITOT, PROT, ALBUMIN in the last 168 hours. No results for input(s): LIPASE, AMYLASE in the last 168 hours. No results for input(s): AMMONIA in the last 168 hours. Coagulation Profile: No results for input(s): INR, PROTIME in the last 168 hours. Cardiac Enzymes: No results for input(s): CKTOTAL, CKMB, CKMBINDEX, TROPONINI in the last 168 hours. BNP (last 3 results) No results for input(s): PROBNP in the last 8760 hours. HbA1C: No results for input(s): HGBA1C in the last 72 hours. CBG: Recent Labs  Lab 05/07/21 1408 05/07/21 1716 05/07/21 1823 05/07/21 1953 05/07/21 2027  GLUCAP 544* 342* 221* 75 84   Lipid Profile: No results for input(s): CHOL, HDL, LDLCALC, TRIG, CHOLHDL, LDLDIRECT in the last 72 hours. Thyroid Function Tests: No results for input(s): TSH, T4TOTAL, FREET4, T3FREE, THYROIDAB in the last 72 hours. Anemia Panel: No results for input(s): VITAMINB12, FOLATE, FERRITIN, TIBC, IRON, RETICCTPCT in the last 72 hours. Urine analysis:    Component Value Date/Time   COLORURINE YELLOW 05/07/2021 [place] 05/07/2021 1417   LABSPEC 1.014 05/07/2021 1417   PHURINE 6.5 05/07/2021 1417   GLUCOSEU >1,000 (A) 05/07/2021 1417   HGBUR MODERATE (A) 05/07/2021 1417   BILIRUBINUR NEGATIVE 05/07/2021 1417   BILIRUBINUR negative 11/07/2020 1717   KETONESUR NEGATIVE 05/07/2021 1417   PROTEINUR >300 (A) 05/07/2021 1417   UROBILINOGEN 0.2 11/07/2020 1717   NITRITE NEGATIVE 05/07/2021 1417    LEUKOCYTESUR NEGATIVE 05/07/2021 1417    Radiological Exams on Admission: No results found.    Assessment/Plan  Hyperglycemia w/uncontrolled Type 2 DM -presented to ED with  CBG >500 with gap of 14, CO2 of 26 after missing insulin for several days -Last HbA1C of 10 in Oct  -Home regimen is 20 units Basaglar that pt splits into 10 units BID, 1.8g of Victoza.  -Initially started on insulin infusion but arrived here lethargic and obtunded with glucose in the 70s. Wife reports symptoms of hypoglycemia usually happens with BG <150 at home -Pt became more alert after insulin infusion discontinued and wanted to eat. Switch to LR fluid and okay to start diet. Place on moderate SSI.  -Start Semglee tomorrow morning at 15 units  Hypertensive urgency has not been on antihypertensive for a few days He was given amlodipine, Coreg, and Losartan in the ED.  Will resume hydralazine and doxazosin as well -Hold losartan due to AKI and hold spirolactone while getting fluid resuscitated   Acute on CKD stage 3  -creatinine of 6.57 from baseline of around 2-3 -continue aggressive fluid hydration  -check urine studies -avoid nephrotoxic agent, renally dose medication and avoid contrasted studies  Hypokalemia Continue to replete with IV 10 mEq x 4 K  Hypomagnesemia Magnesium of 1.4.  Replete with IV magnesium  Hypocalcemia Calcium was 6.2, no albumin obtained Given IV calcium gluconate in the ED.  Also will replete magnesium and add on oral  Os-cal  Follow repeat BMP   Combined systolic and diastolic CHF Compensated Last echocardiogram on 06/2020 with EF of 45 to 50%  DVT prophylaxis:.Lovenox Code Status: Full Family Communication: Plan discussed with wife at bedside  disposition Plan: Home with at least 2 midnight stays  Consults called:  Admission status: inpatient  Level of care: Stepdown  Status is: Inpatient  Remains inpatient appropriate because: Admit - It is my clinical opinion  that admission to INPATIENT is reasonable and necessary because this patient will require at least 2 midnights in the hospital to treat this condition based on the medical complexity of the problems presented.  Given the aforementioned information, the predictability of an adverse outcome is felt to be significant.         Orene Desanctis DO [facility]   If 7PM-7AM, please contact night-coverage [email or web address]   05/07/2021, 9:17 PM

## 2021-05-08 LAB — BASIC METABOLIC PANEL
Anion gap: 11 (ref 5–15)
Anion gap: 7 (ref 5–15)
BUN: 59 mg/dL — ABNORMAL HIGH (ref 6–20)
BUN: 59 mg/dL — ABNORMAL HIGH (ref 6–20)
CO2: 23 mmol/L (ref 22–32)
CO2: 22 mmol/L (ref 22–32)
Calcium: 6.2 mg/dL — CL (ref 8.9–10.3)
Calcium: 6 mg/dL — CL (ref 8.9–10.3)
Chloride: 100 mmol/L (ref 98–111)
Chloride: 104 mmol/L (ref 98–111)
Creatinine, Ser: 6.11 mg/dL — ABNORMAL HIGH (ref 0.61–1.24)
Creatinine, Ser: 6.69 mg/dL — ABNORMAL HIGH (ref 0.61–1.24)
GFR, Estimated: 10 mL/min — ABNORMAL LOW (ref >60)
GFR, Estimated: 9 mL/min — ABNORMAL LOW (ref >60)
Glucose, Bld: 206 mg/dL — ABNORMAL HIGH (ref 70–99)
Glucose, Bld: 341 mg/dL — ABNORMAL HIGH (ref 70–99)
Potassium: 2.6 mmol/L — CL (ref 3.5–5.1)
Potassium: 3.2 mmol/L — ABNORMAL LOW (ref 3.5–5.1)
Sodium: 134 mmol/L — ABNORMAL LOW (ref 135–145)
Sodium: 133 mmol/L — ABNORMAL LOW (ref 135–145)

## 2021-05-08 LAB — CREATININE, URINE, RANDOM: Creatinine, Urine: 146.26 mg/dL

## 2021-05-08 LAB — GLUCOSE, CAPILLARY
Glucose-Capillary: 159 mg/dL — ABNORMAL HIGH (ref 70–99)
Glucose-Capillary: 202 mg/dL — ABNORMAL HIGH (ref 70–99)
Glucose-Capillary: 140 mg/dL — ABNORMAL HIGH (ref 70–99)
Glucose-Capillary: 237 mg/dL — ABNORMAL HIGH (ref 70–99)
Glucose-Capillary: 292 mg/dL — ABNORMAL HIGH (ref 70–99)
Glucose-Capillary: 325 mg/dL — ABNORMAL HIGH (ref 70–99)

## 2021-05-08 LAB — HEMOGLOBIN A1C
Hgb A1c MFr Bld: 13.5 % — ABNORMAL HIGH (ref 4.8–5.6)
Mean Plasma Glucose: 340.75 mg/dL

## 2021-05-08 LAB — MAGNESIUM
Magnesium: 1.7 mg/dL (ref 1.7–2.4)
Magnesium: 1.8 mg/dL (ref 1.7–2.4)

## 2021-05-08 LAB — ALBUMIN: Albumin: 2.4 g/dL — ABNORMAL LOW (ref 3.5–5.0)

## 2021-05-08 LAB — SODIUM, URINE, RANDOM: Sodium, Ur: 31 mmol/L

## 2021-05-08 MED ORDER — LORAZEPAM 2 MG/ML IJ SOLN: 0.5000 mg | Freq: Once | INTRAMUSCULAR | Status: DC

## 2021-05-08 MED ORDER — FLUTICASONE FUROATE-VILANTEROL 100-25 MCG/ACT IN AEPB
1.0000 | INHALATION_SPRAY | Freq: Every day | RESPIRATORY_TRACT | Status: DC
  Administered 2021-05-08 – 2021-05-09 (×2): 1 via RESPIRATORY_TRACT
  Filled 2021-05-08 (×2): qty 28

## 2021-05-08 MED ORDER — CALCIUM GLUCONATE-NACL 2-0.675 GM/100ML-% IV SOLN
2.0000 g | Freq: Once | INTRAVENOUS | Status: AC
  Administered 2021-05-08: 2000 mg via INTRAVENOUS
  Filled 2021-05-08: qty 100

## 2021-05-08 MED ORDER — CHLORHEXIDINE GLUCONATE CLOTH 2 % EX PADS
6.0000 | MEDICATED_PAD | Freq: Every day | CUTANEOUS | Status: DC
  Administered 2021-05-09 – 2021-05-10 (×2): 6 via TOPICAL

## 2021-05-08 MED ORDER — POTASSIUM CHLORIDE CRYS ER 20 MEQ PO TBCR
40.0000 meq | EXTENDED_RELEASE_TABLET | ORAL | Status: AC
  Administered 2021-05-08 (×3): 40 meq via ORAL
  Filled 2021-05-08 (×3): qty 2

## 2021-05-08 MED ORDER — POTASSIUM CHLORIDE CRYS ER 20 MEQ PO TBCR
40.0000 meq | EXTENDED_RELEASE_TABLET | Freq: Once | ORAL | Status: AC
  Administered 2021-05-09: 40 meq via ORAL
  Filled 2021-05-08: qty 2

## 2021-05-08 MED ORDER — ENOXAPARIN SODIUM 30 MG/0.3ML IJ SOSY
30.0000 mg | PREFILLED_SYRINGE | INTRAMUSCULAR | Status: DC
  Administered 2021-05-08 – 2021-05-09 (×2): 30 mg via SUBCUTANEOUS
  Filled 2021-05-08 (×2): qty 0.3

## 2021-05-08 MED ORDER — INSULIN GLARGINE-YFGN 100 UNIT/ML ~~LOC~~ SOLN
15.0000 [IU] | Freq: Every day | SUBCUTANEOUS | Status: DC
  Administered 2021-05-08: 15 [IU] via SUBCUTANEOUS
  Filled 2021-05-08 (×2): qty 0.15

## 2021-05-08 NOTE — Progress Notes (Addendum)
PROGRESS NOTE    Matthew Odom  NTI:[id number] DOB: 04-Aug-1970 DOA: 05/07/2021 PCP: Camillia Herter, NP    Brief Narrative:  Matthew Odom is a 51 year old male with past medical history significant for type 2 diabetes mellitus, essential hypertension, CKD stage IIIb, combined chronic systolic and diastolic congestive heart failure, COPD who presented to [facility] as a transfer from [facility] with elevated blood sugar, somnolence, and difficulty sleeping.  Due to patient's mental status, wife at bedside contributes to the HPI.  Wife reports that he usually starts feeling poorly once his blood sugar gets below 150.  Patient was more lethargic today with dry mouth and complained of leg cramping.  When wife checked his blood sugar, it was noted to be 517 which is the highest she has ever seen.  She gave him his daily 20 units of Basaglar and Victoza injection.  She checked his glucose about an hour later and it was more elevated to 557 so she brought him to the ED for further evaluation.  Wife reports that he has not had his insulin for the past few days, also has not been taking his antihypertensives as well as he is run out of some of his prescription medication.  In the ED, temperature 97.9 F, HR 101, RR 18, BP 198/143, SPO2 89% on room air.  Sodium 128, potassium 2.7, chloride 88, CO2 26, glucose 545, BUN 62, creatinine 6.57, calcium 6.2.  Magnesium 1.4.  Anion gap 14.  WBC 7.8, hemoglobin 14.1, platelets 304.  COVID-19 PCR negative.  Influenza A/B PCR negative.  Urinalysis with negative ketones.  Patient was started on IV fluids, given 10 mEq IV potassium and 40 mEq oral potassium, 1 g IV calcium gluconate and started on insulin infusion.  Hospitalist service consulted for further evaluation and management of hypoglycemia, altered mental status, hypertensive urgency, acute renal failure on CKD stage IIIb.   Assessment & Plan:   Principal Problem:   Hyperglycemia due to diabetes mellitus  (HCC) Active Problems:   Hypertensive urgency   Chronic combined systolic and diastolic CHF, NYHA class 3 (HCC)   Acute-on-chronic kidney injury (HCC)   Hypokalemia   Hypomagnesemia   Hypocalcemia   Type 2 diabetes mellitus with hyperglycemia Hemoglobin A1c 13.5, poorly controlled.  Patient presenting with a glucose greater than 500 with apparently been out of his home medications for several days.  Likely also a factor of noncompliance when he does have his medications. --Diabetic educator following, appreciate assistance --Semglee 15 units [place] daily --Moderate SSI for coverage --CBGs qAC/HS  Hypertensive urgency BP on arrival 198/143.  Patient has been noncompliant with his home antihypertensives. --Holding home spironolactone, furosemide, losartan due to renal failure --Carvedilol 25 mg p.o. twice daily --Doxazosin 8 mg p.o. daily --Hydralazine 100 mg PO TID --Amlodipine 10 mg p.o. daily --continue to monitor BP closely  Acute renal failure on CKD stage IIIb Baseline creatinine 2.3-3.2.  Creatinine on admission elevated to 6.57. FeNa = 1.0%; which is consistent with prerenal azotemia likely secondary to dehydration. --Cr 6.57>6.11 --Continue IVF with LR at 75 mL/h --Avoid nephrotoxins, renal dose all medications --BMP daily  Hyponatremia Sodium 128 on admission.  Likely secondary to hypovolemic hyponatremia in the setting of dehydration and hyperglycemia as above. --Na 128>134 --Continue IV fluid hydration as above --BMP in a.m.  Hypokalemia Hypomagnesemia Repleted. --Closely monitor electrolytes daily  Hypocalcemia Calcium 6.2 this morning, corrected for hypoalbuminemia = 7.5. --Calcium gluconate 2 g IV x1 today --Repeat calcium/albumin level in the  a.m.  Chronic combined systolic/diastolic congestive heart failure, compensated TTE 06/29/2018 with LVEF 50-09%, diastolic dysfunction, LA moderately dilated, RA mildly dilated. --Continue carvedilol 25mg  p.o. twice  daily --Holding ARB secondary to acute renal failure as above --Strict I's and O's and daily weights  Ascending aorta dilation Mild dilation ascending aorta measuring 41 mm on TTE 06/29/2018. --Outpatient surveillance with PCP/cardiology  COPD --Breo Ellipta 1 puff daily --wean O2  OSA, presumed Would benefit from outpatient sleep study --CPAP qHS --Check VBG in the a.m.  Morbid obesity Body mass index is 35.87 kg/m.  Discussed with patient needs for aggressive lifestyle changes/weight loss as this complicates all facets of care.  Outpatient follow-up with PCP.     DVT prophylaxis: enoxaparin (LOVENOX) injection 30 mg Start: 05/08/21 2200   Code Status: Full Code Family Communication: Updated family present at bedside this morning.  Disposition Plan:  Level of care: Stepdown Status is: Inpatient  Remains inpatient appropriate because: Continues with significant electrolyte disturbances requiring replacement, continues with IV fluid hydration needs further titration of insulin regimen.    Consultants:  None  Procedures:  None  Antimicrobials:  None   Subjective: Patient seen examined bedside, resting comfortably.  Sleeping but easily arousable.  Family present at bedside.  Irritated regarding time spent in the ED overnight.  Patient reports that he gets very lethargic when his glucose drops below 150.  Patient reports intermittent chest discomfort.  Discussed with him that this is likely related to his poorly controlled blood pressure.  No other specific complaints or concerns at this time.  Denies headache, no visual changes, no current chest pain, no palpitations, no shortness of breath, no abdominal pain, no fever/chills/night sweats, no nausea/vomiting/diarrhea.  No acute events overnight per nursing staff.  Objective: Vitals:   05/08/21 0800 05/08/21 0900 05/08/21 1000 05/08/21 1200  BP: (!) 181/108 (!) 177/99 126/69 (!) 119/59  Pulse: 83 81 81 80  Resp: 13 13  15 13   Temp: (!) 97.3 F (36.3 C)     TempSrc: Oral     SpO2: 94% 91% (!) 79% 98%  Weight:      Height:        Intake/Output Summary (Last 24 hours) at 05/08/2021 1224 Last data filed at 05/08/2021 1031 Gross per 24 hour  Intake 2027.06 ml  Output 550 ml  Net 1477.06 ml   Filed Weights   05/07/21 1408  Weight: 113.4 kg    Examination:  General exam: Appears calm and comfortable, obese Respiratory system: Clear to auscultation. Respiratory effort normal.  No wheezing/crackles, on 4 L nasal cannula Cardiovascular system: S1 & S2 heard, RRR. No JVD, murmurs, rubs, gallops or clicks. No pedal edema. Gastrointestinal system: Abdomen is nondistended, soft and nontender. No organomegaly or masses felt. Normal bowel sounds heard. Central nervous system: Alert and oriented. No focal neurological deficits. Extremities: Symmetric 5 x 5 power. Skin: No rashes, lesions or ulcers Psychiatry: Judgement and insight appear poor. Mood & affect appropriate.     Data Reviewed: I have personally reviewed following labs and imaging studies  CBC: Recent Labs  Lab 05/07/21 1415  WBC 7.8  HGB 14.1  HCT 42.2  MCV 79.0*  PLT 381   Basic Metabolic Panel: Recent Labs  Lab 05/07/21 1415 05/07/21 2253 05/08/21 1018  NA 128* 136 134*  K 2.7* 2.6* 2.6*  CL 88* 101 100  CO2 26 25 23   GLUCOSE 545* 159* 206*  BUN 62* 62* 59*  CREATININE 6.57* 6.36* 6.11*  CALCIUM  6.2* 6.0* 6.2*  MG 1.4*  --  1.7   GFR: Estimated Creatinine Clearance: 18.2 mL/min (A) (by C-G formula based on SCr of 6.11 mg/dL (H)). Liver Function Tests: Recent Labs  Lab 05/08/21 1018  ALBUMIN 2.4*   No results for input(s): LIPASE, AMYLASE in the last 168 hours. No results for input(s): AMMONIA in the last 168 hours. Coagulation Profile: No results for input(s): INR, PROTIME in the last 168 hours. Cardiac Enzymes: No results for input(s): CKTOTAL, CKMB, CKMBINDEX, TROPONINI in the last 168 hours. BNP (last 3  results) No results for input(s): PROBNP in the last 8760 hours. HbA1C: Recent Labs    05/07/21 2317  HGBA1C 13.5*   CBG: Recent Labs  Lab 05/07/21 2250 05/08/21 0126 05/08/21 0418 05/08/21 0746 05/08/21 1147  GLUCAP 154* 159* 202* 140* 237*   Lipid Profile: No results for input(s): CHOL, HDL, LDLCALC, TRIG, CHOLHDL, LDLDIRECT in the last 72 hours. Thyroid Function Tests: No results for input(s): TSH, T4TOTAL, FREET4, T3FREE, THYROIDAB in the last 72 hours. Anemia Panel: No results for input(s): VITAMINB12, FOLATE, FERRITIN, TIBC, IRON, RETICCTPCT in the last 72 hours. Sepsis Labs: No results for input(s): PROCALCITON, LATICACIDVEN in the last 168 hours.  Recent Results (from the past 240 hour(s))  Resp Panel by RT-PCR (Flu A&B, Covid) Nasopharyngeal Swab     Status: None   Collection Time: 05/07/21  4:52 PM   Specimen: Nasopharyngeal Swab; Nasopharyngeal(NP) swabs in vial transport medium  Result Value Ref Range Status   SARS Coronavirus 2 by RT PCR NEGATIVE NEGATIVE Final    Comment: (NOTE) SARS-CoV-2 target nucleic acids are NOT DETECTED.  The SARS-CoV-2 RNA is generally detectable in upper respiratory specimens during the acute phase of infection. The lowest concentration of SARS-CoV-2 viral copies this assay can detect is 138 copies/mL. A negative result does not preclude SARS-Cov-2 infection and should not be used as the sole basis for treatment or other patient management decisions. A negative result may occur with  improper specimen collection/handling, submission of specimen other than nasopharyngeal swab, presence of viral mutation(s) within the areas targeted by this assay, and inadequate number of viral copies(<138 copies/mL). A negative result must be combined with clinical observations, patient history, and epidemiological information. The expected result is Negative.  Fact Sheet for Patients:  EntrepreneurPulse.com.au  Fact Sheet for  Healthcare Providers:  IncredibleEmployment.be  This test is no t yet approved or cleared by the [place] FDA and  has been authorized for detection and/or diagnosis of SARS-CoV-2 by FDA under an Emergency Use Authorization (EUA). This EUA will remain  in effect (meaning this test can be used) for the duration of the COVID-19 declaration under Section 564(b)(1) of the Act, 21 U.S.C.section 360bbb-3(b)(1), unless the authorization is terminated  or revoked sooner.       Influenza A by PCR NEGATIVE NEGATIVE Final   Influenza B by PCR NEGATIVE NEGATIVE Final    Comment: (NOTE) The Xpert Xpress SARS-CoV-2/FLU/RSV plus assay is intended as an aid in the diagnosis of influenza from Nasopharyngeal swab specimens and should not be used as a sole basis for treatment. Nasal washings and aspirates are unacceptable for Xpert Xpress SARS-CoV-2/FLU/RSV testing.  Fact Sheet for Patients: EntrepreneurPulse.com.au  Fact Sheet for Healthcare Providers: IncredibleEmployment.be  This test is not yet approved or cleared by the [place] FDA and has been authorized for detection and/or diagnosis of SARS-CoV-2 by FDA under an Emergency Use Authorization (EUA). This EUA will remain in effect (meaning this test  can be used) for the duration of the COVID-19 declaration under Section 564(b)(1) of the Act, 21 U.S.C. section 360bbb-3(b)(1), unless the authorization is terminated or revoked.  Performed at [facility], [address], [place], [place] [address]   MRSA Next Gen by PCR, Nasal     Status: None   Collection Time: 05/07/21  7:37 PM   Specimen: Nasal Mucosa; Nasal Swab  Result Value Ref Range Status   MRSA by PCR Next Gen NOT DETECTED NOT DETECTED Final    Comment: (NOTE) The GeneXpert MRSA Assay (FDA approved for NASAL specimens only), is one component of a comprehensive MRSA colonization  surveillance program. It is not intended to diagnose MRSA infection nor to guide or monitor treatment for MRSA infections. Test performance is not FDA approved in patients less than 77 years old. Performed at [facility], [place] [address], [place], [place] [address]          Radiology Studies: No results found.      Scheduled Meds:  amLODipine  10 mg Oral Daily   carvedilol  25 mg Oral BID WC   [START ON 05/09/2021] Chlorhexidine Gluconate Cloth  6 each Topical Q0600   doxazosin  8 mg Oral Daily   enoxaparin (LOVENOX) injection  30 mg Subcutaneous Q24H   hydrALAZINE  100 mg Oral Q8H   insulin aspart  0-15 Units Subcutaneous TID PC,HS,0200   insulin glargine-yfgn  15 Units Subcutaneous Daily   potassium chloride  40 mEq Oral Q3H   Continuous Infusions:  sodium chloride 10 mL/hr at 05/08/21 1033   sodium chloride Stopped (05/08/21 0908)   calcium gluconate 2,000 mg (05/08/21 1141)   lactated ringers 75 mL/hr at 05/08/21 0908     LOS: 1 day    Time spent: 46 minutes spent on chart review, discussion with nursing staff, consultants, updating family and interview/physical exam; more than 50% of that time was spent in counseling and/or coordination of care.    Zamantha Strebel J [place] DO [facility] Available via Epic secure chat 7am-7pm After these hours, please refer to coverage provider listed on [email or web address] 05/08/2021, 12:24 PM

## 2021-05-08 NOTE — Progress Notes (Signed)
Pt mentation and CBG back at baseline - Dr. Flossie Buffy notified;

## 2021-05-08 NOTE — Clinical Social Work Note (Signed)
°  Transition of Care [facility]) Screening Note   Patient Details  Name: Matthew Odom Date of Birth: 02-May-1971   Transition of Care [facility]) CM/SW Contact:    Trish Mage, LCSW Phone Number: 05/08/2021, 8:20 AM    Transition of Care Department [facility]) has reviewed patient and no TOC needs have been identified at this time. We will continue to monitor patient advancement through interdisciplinary progression rounds. If new patient transition needs arise, please place a TOC consult.

## 2021-05-08 NOTE — Progress Notes (Addendum)
Inpatient Diabetes Program Recommendations  AACE/ADA: New Consensus Statement on Inpatient Glycemic Control (2015)  Target Ranges:  Prepandial:   less than 140 mg/dL      Peak postprandial:   less than 180 mg/dL (1-2 hours)      Critically ill patients:  140 - 180 mg/dL   Lab Results  Component Value Date   GLUCAP 140 (H) 05/08/2021   HGBA1C 13.5 (H) 05/07/2021    Review of Glycemic Control  Diabetes history: DM 2 Outpatient Diabetes medications: Basaglar 10 units bid, Victoza 1.8 mg Weekly, Novolog? Current orders for Inpatient glycemic control:  Semglee 15 units Novolog 0-15 units tid, hs, and 0200  A1c 13.5% on 1/3  Spoke w/pt at bedside. Pt sees his PCP every 3 months. Pt reports getting his A1c from a 12 % down to an 8%. Reports not paying attention to what he is doing with his lifestyle lately and is not as active. Pt reports checking glucose a few times a week mostly in the mornings or at lunch but not everyday or later in the day. Pt reports glucose usually between 150-ad 200 and he does not take his insulin thinking his sugar is good. Discussed that insulin is what is keeping his glucose at a good level. Described that his body maybe changing overtime. Discussed current A1c of 13.5%. Discussed glucose and A1c goals. Encouraged pt to check glucose later in the day to see how high it gets. Encouraged PCP follow up and to pay more attention to glucose control.  Thanks,  Tama Headings RN, MSN, BC-ADM Inpatient Diabetes Coordinator Team Pager [phone] (8a-5p)

## 2021-05-08 NOTE — Progress Notes (Signed)
° ° °  OVERNIGHT PROGRESS REPORT  Potassium replacement ordered.      Gershon Cull MSNA MSN ACNPC-AG Acute Care Nurse Practitioner [place]

## 2021-05-09 ENCOUNTER — Telehealth: Payer: Self-pay

## 2021-05-09 LAB — GLUCOSE, CAPILLARY
Glucose-Capillary: 237 mg/dL — ABNORMAL HIGH (ref 70–99)
Glucose-Capillary: 246 mg/dL — ABNORMAL HIGH (ref 70–99)
Glucose-Capillary: 192 mg/dL — ABNORMAL HIGH (ref 70–99)
Glucose-Capillary: 249 mg/dL — ABNORMAL HIGH (ref 70–99)
Glucose-Capillary: 261 mg/dL — ABNORMAL HIGH (ref 70–99)
Glucose-Capillary: 317 mg/dL — ABNORMAL HIGH (ref 70–99)
Glucose-Capillary: 407 mg/dL — ABNORMAL HIGH (ref 70–99)

## 2021-05-09 LAB — BASIC METABOLIC PANEL
Anion gap: 11 (ref 5–15)
Anion gap: 9 (ref 5–15)
BUN: 60 mg/dL — ABNORMAL HIGH (ref 6–20)
BUN: 58 mg/dL — ABNORMAL HIGH (ref 6–20)
CO2: 21 mmol/L — ABNORMAL LOW (ref 22–32)
CO2: 20 mmol/L — ABNORMAL LOW (ref 22–32)
Calcium: 6.2 mg/dL — CL (ref 8.9–10.3)
Calcium: 6.5 mg/dL — ABNORMAL LOW (ref 8.9–10.3)
Chloride: 105 mmol/L (ref 98–111)
Chloride: 105 mmol/L (ref 98–111)
Creatinine, Ser: 6.12 mg/dL — ABNORMAL HIGH (ref 0.61–1.24)
Creatinine, Ser: 6.06 mg/dL — ABNORMAL HIGH (ref 0.61–1.24)
GFR, Estimated: 10 mL/min — ABNORMAL LOW (ref >60)
GFR, Estimated: 11 mL/min — ABNORMAL LOW (ref >60)
Glucose, Bld: 277 mg/dL — ABNORMAL HIGH (ref 70–99)
Glucose, Bld: 300 mg/dL — ABNORMAL HIGH (ref 70–99)
Potassium: 3.2 mmol/L — ABNORMAL LOW (ref 3.5–5.1)
Potassium: 3.7 mmol/L (ref 3.5–5.1)
Sodium: 137 mmol/L (ref 135–145)
Sodium: 134 mmol/L — ABNORMAL LOW (ref 135–145)

## 2021-05-09 LAB — HEPATIC FUNCTION PANEL
ALT: 13 U/L (ref 0–44)
AST: 14 U/L — ABNORMAL LOW (ref 15–41)
Albumin: 2.4 g/dL — ABNORMAL LOW (ref 3.5–5.0)
Alkaline Phosphatase: 95 U/L (ref 38–126)
Bilirubin, Direct: 0.2 mg/dL (ref 0.0–0.2)
Indirect Bilirubin: 0.3 mg/dL (ref 0.3–0.9)
Total Bilirubin: 0.5 mg/dL (ref 0.3–1.2)
Total Protein: 5.5 g/dL — ABNORMAL LOW (ref 6.5–8.1)

## 2021-05-09 LAB — BLOOD GAS, VENOUS
Acid-base deficit: 2.4 mmol/L — ABNORMAL HIGH (ref 0.0–2.0)
Bicarbonate: 20.9 mmol/L (ref 20.0–28.0)
O2 Saturation: 96.6 %
Patient temperature: 98.6
pCO2, Ven: 33.3 mmHg — ABNORMAL LOW (ref 44.0–60.0)
pH, Ven: 7.415 (ref 7.250–7.430)
pO2, Ven: 82.2 mmHg — ABNORMAL HIGH (ref 32.0–45.0)

## 2021-05-09 LAB — GLUCOSE, RANDOM: Glucose, Bld: 413 mg/dL — ABNORMAL HIGH (ref 70–99)

## 2021-05-09 LAB — MAGNESIUM: Magnesium: 1.9 mg/dL (ref 1.7–2.4)

## 2021-05-09 MED ORDER — POTASSIUM CHLORIDE CRYS ER 20 MEQ PO TBCR
40.0000 meq | EXTENDED_RELEASE_TABLET | ORAL | Status: AC
  Administered 2021-05-09 (×2): 40 meq via ORAL
  Filled 2021-05-09 (×2): qty 2

## 2021-05-09 MED ORDER — CALCIUM GLUCONATE-NACL 2-0.675 GM/100ML-% IV SOLN
2.0000 g | Freq: Once | INTRAVENOUS | Status: AC
  Administered 2021-05-09: 2000 mg via INTRAVENOUS
  Filled 2021-05-09: qty 100

## 2021-05-09 MED ORDER — INSULIN GLARGINE-YFGN 100 UNIT/ML ~~LOC~~ SOLN
25.0000 [IU] | Freq: Every day | SUBCUTANEOUS | Status: DC
  Administered 2021-05-09: 25 [IU] via SUBCUTANEOUS
  Filled 2021-05-09 (×2): qty 0.25

## 2021-05-09 MED ORDER — ALPRAZOLAM 0.25 MG PO TABS
0.2500 mg | ORAL_TABLET | Freq: Once | ORAL | Status: AC
  Administered 2021-05-09: 0.25 mg via ORAL
  Filled 2021-05-09: qty 1

## 2021-05-09 MED ORDER — MAGNESIUM SULFATE 2 GM/50ML IV SOLN
2.0000 g | Freq: Once | INTRAVENOUS | Status: AC
  Administered 2021-05-09: 2 g via INTRAVENOUS
  Filled 2021-05-09: qty 50

## 2021-05-09 NOTE — TOC Initial Note (Signed)
Transition of Care [facility]) - Initial/Assessment Note    Patient Details  Name: Matthew Odom MRN: [id number] Date of Birth: 1971/04/28  Transition of Care [facility]) CM/SW Contact:    Trish Mage, LCSW Phone Number: 05/09/2021, 12:54 PM  Clinical Narrative:   Attempted, without success, to get patient set up and fitted for CPAP machine while here. Eden Lathe, RN CM at [facility] confirmed that they had secured a refurbished CPAP for patient, free of charge, and would be able to deliver today if someone from our RT department could provide education to patient re: use.  This proved to be a hurdle too high to clear.  As a result, Ms Scarlette Ar will reach out to their RT dpt., who will then reach out to patient post d/c about setting up a fitting and use session. TOC will continue to follow during the course of hospitalization.               Expected Discharge Plan: Home/Self Care Barriers to Discharge: No Barriers Identified   Patient Goals and CMS Choice        Expected Discharge Plan and Services Expected Discharge Plan: Home/Self Care   Discharge Planning Services: CM Consult   Living arrangements for the past 2 months: Single Family Home                                      Prior Living Arrangements/Services Living arrangements for the past 2 months: Single Family Home Lives with:: Spouse Patient language and need for interpreter reviewed:: Yes        Need for Family Participation in Patient Care: Yes (Comment) Care giver support system in place?: Yes (comment)   Criminal Activity/Legal Involvement Pertinent to Current Situation/Hospitalization: No - Comment as needed  Activities of Daily Living Home Assistive Devices/Equipment: None ADL Screening (condition at time of admission) Patient's cognitive ability adequate to safely complete daily activities?: Yes Is the patient deaf or have difficulty hearing?: No Does the patient have difficulty seeing, even when wearing  glasses/contacts?: No Does the patient have difficulty concentrating, remembering, or making decisions?: No Patient able to express need for assistance with ADLs?: No Does the patient have difficulty dressing or bathing?: No Independently performs ADLs?: Yes (appropriate for developmental age) Does the patient have difficulty walking or climbing stairs?: No Weakness of Legs: None Weakness of Arms/Hands: None  Permission Sought/Granted                  Emotional Assessment              Admission diagnosis:  Hypocalcemia [E83.51] Hypokalemia [E87.6] Hyperglycemia [R73.9] AKI (acute kidney injury) ([place]) [N17.9] Patient Active Problem List   Diagnosis Date Noted   AKI (acute kidney injury) ([place]) 05/07/2021   Hyperglycemia due to diabetes mellitus ([place]) 05/07/2021   Acute-on-chronic kidney injury ([place]) 05/07/2021   Hypokalemia 05/07/2021   Hypomagnesemia 05/07/2021   Hypocalcemia 05/07/2021   Hyperglycemia 11/08/2020   Orthostatic hypotension 11/28/2019   Type 2 diabetes mellitus, with long-term current use of insulin ([place]) 09/27/2018   Chest pain 06/29/2018   Degenerative spondylolisthesis 04/04/2018   Lumbar radiculopathy 04/04/2018   Spinal stenosis of lumbar region with neurogenic claudication 04/04/2018   Synovial cyst of lumbar facet joint 04/04/2018   CKD (chronic kidney disease), stage III ([place]) 01/03/2018   Essential hypertension 01/02/2018   Hyperlipemia 01/02/2018   Hypertensive urgency 01/02/2018  Abnormal EKG 01/02/2018   COPD with acute bronchitis ([place]) 01/02/2018   Nicotine abuse 01/02/2018   Chronic combined systolic and diastolic CHF, NYHA class 3 ([place]) 02/15/2016   LVH (left ventricular hypertrophy) due to hypertensive disease 02/15/2016   Nonischemic cardiomyopathy ([place]) 03/23/2015   PCP:  Camillia Herter, NP Pharmacy:   [facility] [address] [place] [address] Phone:  [phone] Fax: [phone]     Social Determinants of Health (SDOH) Interventions    Readmission Risk Interventions No flowsheet data found.

## 2021-05-09 NOTE — Progress Notes (Addendum)
PROGRESS NOTE    CAVEN PERINE  JQB:[id number] DOB: 1970/12/09 DOA: 05/07/2021 PCP: Camillia Herter, NP    Brief Narrative:  Matthew Odom is a 51 year old male with past medical history significant for type 2 diabetes mellitus, essential hypertension, CKD stage IIIb, combined chronic systolic and diastolic congestive heart failure, COPD who presented to [facility] as a transfer from [facility] with elevated blood sugar, somnolence, and difficulty sleeping.  Due to patient's mental status, wife at bedside contributes to the HPI.  Wife reports that he usually starts feeling poorly once his blood sugar gets below 150.  Patient was more lethargic today with dry mouth and complained of leg cramping.  When wife checked his blood sugar, it was noted to be 517 which is the highest she has ever seen.  She gave him his daily 20 units of Basaglar and Victoza injection.  She checked his glucose about an hour later and it was more elevated to 557 so she brought him to the ED for further evaluation.  Wife reports that he has not had his insulin for the past few days, also has not been taking his antihypertensives as well as he is run out of some of his prescription medication.  In the ED, temperature 97.9 F, HR 101, RR 18, BP 198/143, SPO2 89% on room air.  Sodium 128, potassium 2.7, chloride 88, CO2 26, glucose 545, BUN 62, creatinine 6.57, calcium 6.2.  Magnesium 1.4.  Anion gap 14.  WBC 7.8, hemoglobin 14.1, platelets 304.  COVID-19 PCR negative.  Influenza A/B PCR negative.  Urinalysis with negative ketones.  Patient was started on IV fluids, given 10 mEq IV potassium and 40 mEq oral potassium, 1 g IV calcium gluconate and started on insulin infusion.  Hospitalist service consulted for further evaluation and management of hypoglycemia, altered mental status, hypertensive urgency, acute renal failure on CKD stage IIIb.   Assessment & Plan:   Principal Problem:   Hyperglycemia due to diabetes mellitus  (HCC) Active Problems:   Hypertensive urgency   Chronic combined systolic and diastolic CHF, NYHA class 3 (HCC)   Acute-on-chronic kidney injury (HCC)   Hypokalemia   Hypomagnesemia   Hypocalcemia   Type 2 diabetes mellitus with hyperglycemia Hemoglobin A1c 13.5, poorly controlled.  Patient presenting with a glucose greater than 500 with apparently been out of his home medications for several days.  Likely also a factor of noncompliance when he does have his medications. --Diabetic educator following, appreciate assistance --Semglee 25 units [place] daily --Moderate SSI for coverage --CBGs qAC/HS  Hypertensive urgency BP on arrival 198/143.  Patient has been noncompliant with his home antihypertensives. --Holding home spironolactone, furosemide, losartan due to renal failure --Carvedilol 25 mg p.o. twice daily --Doxazosin 8 mg p.o. daily --Hydralazine 100 mg PO TID --Amlodipine 10 mg p.o. 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  Creatinine on admission elevated to 6.57. FeNa = 1.0%; which is consistent with prerenal azotemia likely secondary to dehydration.  Discussed with patient need for possible dialysis, he declines. --Cr 6.57>6.11>6.69>6.12 --Continue IVF with LR at 75 mL/h --Avoid nephrotoxins, renal dose all medications --BMP daily  Hyponatremia: Resolved potassium 3.2, magnesium 1.9, will continue to replete. Sodium 128 on admission.  Likely secondary to hypovolemic hyponatremia in the setting of dehydration and hyperglycemia as above. --Na [id number] --Continue IV fluid hydration as above --BMP in a.m.  Hypokalemia Hypomagnesemia Repleted. --Closely  monitor electrolytes daily  Hypocalcemia Calcium 6.2 this morning, corrected for hypoalbuminemia = 7.5. --Calcium gluconate 2 g IV x1 today --Repeat calcium/albumin level in the a.m.  Chronic combined  systolic/diastolic congestive heart failure, compensated TTE 06/29/2018 with LVEF 95-62%, diastolic dysfunction, LA moderately dilated, RA mildly dilated. --Continue carvedilol 25mg  p.o. twice daily --Holding ARB secondary to acute renal failure as above --Strict I's and O's and daily weights  Ascending aorta dilation Mild dilation ascending aorta measuring 41 mm on TTE 06/29/2018. --Outpatient surveillance with PCP/cardiology  COPD --Breo Ellipta 1 puff daily --wean O2  OSA, presumed Would benefit from outpatient sleep study --CPAP qHS --Check VBG in the a.m.  Morbid obesity Body mass index is 35.87 kg/m.  Discussed with patient needs for aggressive lifestyle changes/weight loss as this complicates all facets of care.  Outpatient follow-up with PCP.     DVT prophylaxis: enoxaparin (LOVENOX) injection 30 mg Start: 05/08/21 2200   Code Status: Full Code Family Communication: Updated family present at bedside this morning.  Disposition Plan:  Level of care: Telemetry Status is: Inpatient  Remains inpatient appropriate because: Continues with significant electrolyte disturbances requiring replacement, continues with IV fluid hydration needs further titration of insulin regimen.    Consultants:  None  Procedures:  None  Antimicrobials:  None   Subjective: Patient seen examined bedside, resting comfortably.  Lying in bed.  Patient upset regarding the amount of blood pressure medications he is requiring.  Discussed with him that this is likely complicated by his poorly controlled obstructive sleep apnea.  Patient reports has been trying to get a CPAP machine without avail.  Has had sleep study performed in 2021.  Blood pressure slightly improved.  Will need further up titration of insulin.  Denies chest pain or shortness of breath this morning.  Spouse present at bedside.  Also discussed his worsening renal function, patient states would not be interested in dialysis.  States  follows with a nephrologist on [employer].  No other specific complaints or concerns at this time.  Denies headache, no visual changes, no current chest pain, no palpitations, no shortness of breath, no abdominal pain, no fever/chills/night sweats, no nausea/vomiting/diarrhea.  No acute events overnight per nursing staff.  Objective: Vitals:   05/09/21 0800 05/09/21 0830 05/09/21 0900 05/09/21 1023  BP:  (!) 165/116 (!) 179/102 (!) 156/87  Pulse: 80 89 97   Resp: 17 15 19    Temp: (!) 96.9 F (36.1 C)     TempSrc: Axillary     SpO2: 95% 93% 94%   Weight:      Height:        Intake/Output Summary (Last 24 hours) at 05/09/2021 1053 Last data filed at 05/09/2021 1007 Gross per 24 hour  Intake 2118.76 ml  Output 450 ml  Net 1668.76 ml   Filed Weights   05/07/21 1408  Weight: 113.4 kg    Examination:  General exam: Appears calm and comfortable, obese Respiratory system: Clear to auscultation. Respiratory effort normal.  No wheezing/crackles, on room air Cardiovascular system: S1 & S2 heard, RRR. No JVD, murmurs, rubs, gallops or clicks. No pedal edema. Gastrointestinal system: Abdomen is nondistended, soft and nontender. No organomegaly or masses felt. Normal bowel sounds heard. Central nervous system: Alert and oriented. No focal neurological deficits. Extremities: Symmetric 5 x 5 power. Skin: No rashes, lesions or ulcers Psychiatry: Judgement and insight appear poor. Mood & affect appropriate.     Data Reviewed: I have personally reviewed following labs and imaging studies  CBC: Recent Labs  Lab 05/07/21 1415  WBC 7.8  HGB 14.1  HCT 42.2  MCV 79.0*  PLT 353   Basic Metabolic Panel: Recent Labs  Lab 05/07/21 1415 05/07/21 2253 05/08/21 1018 05/08/21 2129 05/09/21 0305  NA 128* 136 134* 133* 137  K 2.7* 2.6* 2.6* 3.2* 3.2*  CL 88* 101 100 104 105  CO2 26 25 23 22  21*  GLUCOSE 545* 159* 206* 341* 277*  BUN 62* 62* 59* 59* 60*  CREATININE 6.57* 6.36* 6.11*  6.69* 6.12*  CALCIUM 6.2* 6.0* 6.2* 6.0* 6.2*  MG 1.4*  --  1.7 1.8 1.9   GFR: Estimated Creatinine Clearance: 18.2 mL/min (A) (by C-G formula based on SCr of 6.12 mg/dL (H)). Liver Function Tests: Recent Labs  Lab 05/08/21 1018 05/09/21 0305  AST  --  14*  ALT  --  13  ALKPHOS  --  95  BILITOT  --  0.5  PROT  --  5.5*  ALBUMIN 2.4* 2.4*   No results for input(s): LIPASE, AMYLASE in the last 168 hours. No results for input(s): AMMONIA in the last 168 hours. Coagulation Profile: No results for input(s): INR, PROTIME in the last 168 hours. Cardiac Enzymes: No results for input(s): CKTOTAL, CKMB, CKMBINDEX, TROPONINI in the last 168 hours. BNP (last 3 results) No results for input(s): PROBNP in the last 8760 hours. HbA1C: Recent Labs    05/07/21 2317  HGBA1C 13.5*   CBG: Recent Labs  Lab 05/08/21 1648 05/08/21 2136 05/09/21 0311 05/09/21 0449 05/09/21 0747  GLUCAP 292* 325* 237* 246* 192*   Lipid Profile: No results for input(s): CHOL, HDL, LDLCALC, TRIG, CHOLHDL, LDLDIRECT in the last 72 hours. Thyroid Function Tests: No results for input(s): TSH, T4TOTAL, FREET4, T3FREE, THYROIDAB in the last 72 hours. Anemia Panel: No results for input(s): VITAMINB12, FOLATE, FERRITIN, TIBC, IRON, RETICCTPCT in the last 72 hours. Sepsis Labs: No results for input(s): PROCALCITON, LATICACIDVEN in the last 168 hours.  Recent Results (from the past 240 hour(s))  Resp Panel by RT-PCR (Flu A&B, Covid) Nasopharyngeal Swab     Status: None   Collection Time: 05/07/21  4:52 PM   Specimen: Nasopharyngeal Swab; Nasopharyngeal(NP) swabs in vial transport medium  Result Value Ref Range Status   SARS Coronavirus 2 by RT PCR NEGATIVE NEGATIVE Final    Comment: (NOTE) SARS-CoV-2 target nucleic acids are NOT DETECTED.  The SARS-CoV-2 RNA is generally detectable in upper respiratory specimens during the acute phase of infection. The lowest concentration of SARS-CoV-2 viral copies this  assay can detect is 138 copies/mL. A negative result does not preclude SARS-Cov-2 infection and should not be used as the sole basis for treatment or other patient management decisions. A negative result may occur with  improper specimen collection/handling, submission of specimen other than nasopharyngeal swab, presence of viral mutation(s) within the areas targeted by this assay, and inadequate number of viral copies(<138 copies/mL). A negative result must be combined with clinical observations, patient history, and epidemiological information. The expected result is Negative.  Fact Sheet for Patients:  EntrepreneurPulse.com.au  Fact Sheet for Healthcare Providers:  IncredibleEmployment.be  This test is no t yet approved or cleared by the [place] FDA and  has been authorized for detection and/or diagnosis of SARS-CoV-2 by FDA under an Emergency Use Authorization (EUA). This EUA will remain  in effect (meaning this test can be used) for the duration of the COVID-19 declaration under Section 564(b)(1) of the Act, 21 U.S.C.section 360bbb-3(b)(1), unless the authorization  is terminated  or revoked sooner.       Influenza A by PCR NEGATIVE NEGATIVE Final   Influenza B by PCR NEGATIVE NEGATIVE Final    Comment: (NOTE) The Xpert Xpress SARS-CoV-2/FLU/RSV plus assay is intended as an aid in the diagnosis of influenza from Nasopharyngeal swab specimens and should not be used as a sole basis for treatment. Nasal washings and aspirates are unacceptable for Xpert Xpress SARS-CoV-2/FLU/RSV testing.  Fact Sheet for Patients: EntrepreneurPulse.com.au  Fact Sheet for Healthcare Providers: IncredibleEmployment.be  This test is not yet approved or cleared by the [place] FDA and has been authorized for detection and/or diagnosis of SARS-CoV-2 by FDA under an Emergency Use Authorization (EUA). This EUA will  remain in effect (meaning this test can be used) for the duration of the COVID-19 declaration under Section 564(b)(1) of the Act, 21 U.S.C. section 360bbb-3(b)(1), unless the authorization is terminated or revoked.  Performed at [facility], [address], [place], [place] [address]   MRSA Next Gen by PCR, Nasal     Status: None   Collection Time: 05/07/21  7:37 PM   Specimen: Nasal Mucosa; Nasal Swab  Result Value Ref Range Status   MRSA by PCR Next Gen NOT DETECTED NOT DETECTED Final    Comment: (NOTE) The GeneXpert MRSA Assay (FDA approved for NASAL specimens only), is one component of a comprehensive MRSA colonization surveillance program. It is not intended to diagnose MRSA infection nor to guide or monitor treatment for MRSA infections. Test performance is not FDA approved in patients less than 17 years old. Performed at [facility], [place] [address], [place], [place] [address]          Radiology Studies: No results found.      Scheduled Meds:  amLODipine  10 mg Oral Daily   carvedilol  25 mg Oral BID WC   Chlorhexidine Gluconate Cloth  6 each Topical Q0600   doxazosin  8 mg Oral Daily   enoxaparin (LOVENOX) injection  30 mg Subcutaneous Q24H   fluticasone furoate-vilanterol  1 puff Inhalation Daily   hydrALAZINE  100 mg Oral Q8H   insulin aspart  0-15 Units Subcutaneous TID PC,HS,0200   insulin glargine-yfgn  25 Units Subcutaneous Daily   potassium chloride  40 mEq Oral Q3H   Continuous Infusions:  sodium chloride Stopped (05/08/21 1400)   sodium chloride Stopped (05/08/21 0908)   lactated ringers 75 mL/hr at 05/09/21 1007     LOS: 2 days    Time spent: 41 minutes spent on chart review, discussion with nursing staff, consultants, updating family and interview/physical exam; more than 50% of that time was spent in counseling and/or coordination of care.    Alvie Speltz J [place] DO [facility] Available via  Epic secure chat 7am-7pm After these hours, please refer to coverage provider listed on [email or web address] 05/09/2021, 10:53 AM

## 2021-05-09 NOTE — Telephone Encounter (Signed)
This CM spoke with Roque Lias, LCSW who explained that the patient is scheduled to be discharged from the hospital tomorrow- 05/10/2021 and he is inquiring about the status of the CPAP machine.    Informed him that the CPAP machine has been delivered to [facility] from the [employer] /[employer] Assistance Program and an appointment needs to be scheduled for the patient to receive instruction regarding the care and use of the machine prior to receiving it.   The patient has been scheduled for CPAP instruction  with [facility], [place] floor -tomorrow, 05/10/2021 at 1300.  This CM delivered the machine to Terri at [facility] along with a Waiver of Liability for the [employer] that the patient will sign tomorrow when he receives the machine.   This plan has been confirmed with Gerhard Perches as well as Roque Lias.

## 2021-05-09 NOTE — Plan of Care (Signed)
  Problem: Education: Goal: Knowledge of General Education information will improve Description Including pain rating scale, medication(s)/side effects and non-pharmacologic comfort measures Outcome: Progressing   

## 2021-05-09 NOTE — Progress Notes (Signed)
Patient removed PIV by accident and declined to have another PIV inserted.

## 2021-05-10 ENCOUNTER — Ambulatory Visit (HOSPITAL_BASED_OUTPATIENT_CLINIC_OR_DEPARTMENT_OTHER): Payer: MEDICAID | Attending: Internal Medicine | Admitting: Internal Medicine

## 2021-05-10 ENCOUNTER — Other Ambulatory Visit: Payer: Self-pay

## 2021-05-10 DIAGNOSIS — G4733 Obstructive sleep apnea (adult) (pediatric): Secondary | ICD-10-CM

## 2021-05-10 LAB — GLUCOSE, CAPILLARY
Glucose-Capillary: 276 mg/dL — ABNORMAL HIGH (ref 70–99)
Glucose-Capillary: 113 mg/dL — ABNORMAL HIGH (ref 70–99)
Glucose-Capillary: 150 mg/dL — ABNORMAL HIGH (ref 70–99)
Glucose-Capillary: 218 mg/dL — ABNORMAL HIGH (ref 70–99)

## 2021-05-10 LAB — BASIC METABOLIC PANEL
Anion gap: 4 — ABNORMAL LOW (ref 5–15)
BUN: 56 mg/dL — ABNORMAL HIGH (ref 6–20)
CO2: 18 mmol/L — ABNORMAL LOW (ref 22–32)
Calcium: 6.2 mg/dL — CL (ref 8.9–10.3)
Chloride: 113 mmol/L — ABNORMAL HIGH (ref 98–111)
Creatinine, Ser: 6.42 mg/dL — ABNORMAL HIGH (ref 0.61–1.24)
GFR, Estimated: 10 mL/min — ABNORMAL LOW (ref >60)
Glucose, Bld: 143 mg/dL — ABNORMAL HIGH (ref 70–99)
Potassium: 2.9 mmol/L — ABNORMAL LOW (ref 3.5–5.1)
Sodium: 135 mmol/L (ref 135–145)

## 2021-05-10 LAB — MAGNESIUM: Magnesium: 2.1 mg/dL (ref 1.7–2.4)

## 2021-05-10 LAB — ALBUMIN: Albumin: 2.6 g/dL — ABNORMAL LOW (ref 3.5–5.0)

## 2021-05-10 MED ORDER — CARVEDILOL 25 MG PO TABS
25.0000 mg | ORAL_TABLET | Freq: Two times a day (BID) | ORAL | 2 refills | Status: DC
  Filled 2021-05-10 – 2021-05-20 (×3): qty 60, 30d supply, fill #0
  Filled 2021-07-23 – 2021-07-24 (×2): qty 60, 30d supply, fill #1

## 2021-05-10 MED ORDER — CALCIUM CARBONATE 1250 (500 CA) MG PO TABS
1000.0000 mg | ORAL_TABLET | Freq: Two times a day (BID) | ORAL | Status: DC
  Administered 2021-05-10: 1000 mg via ORAL
  Filled 2021-05-10: qty 1

## 2021-05-10 MED ORDER — TAMSULOSIN HCL 0.4 MG PO CAPS
0.4000 mg | ORAL_CAPSULE | Freq: Every day | ORAL | 2 refills | Status: AC
  Filled 2021-05-10 – 2021-05-20 (×3): qty 30, 30d supply, fill #0
  Filled 2021-07-23: qty 30, 30d supply, fill #1
  Filled 2021-09-24: qty 30, 30d supply, fill #2

## 2021-05-10 MED ORDER — NITROGLYCERIN 0.4 MG SL SUBL
0.4000 mg | SUBLINGUAL_TABLET | SUBLINGUAL | 0 refills | Status: DC | PRN
  Filled 2021-05-10: qty 25, 1d supply, fill #0

## 2021-05-10 MED ORDER — ALPRAZOLAM 1 MG PO TABS
1.0000 mg | ORAL_TABLET | Freq: Once | ORAL | Status: AC
  Administered 2021-05-10: 1 mg via ORAL
  Filled 2021-05-10: qty 1

## 2021-05-10 MED ORDER — CALCIUM CARBONATE 1250 (500 CA) MG PO TABS
1.0000 | ORAL_TABLET | Freq: Two times a day (BID) | ORAL | 2 refills | Status: DC
  Filled 2021-05-10: qty 60, 30d supply, fill #0

## 2021-05-10 MED ORDER — ATORVASTATIN CALCIUM 80 MG PO TABS
80.0000 mg | ORAL_TABLET | Freq: Every day | ORAL | 2 refills | Status: DC
  Filled 2021-05-10 – 2021-05-20 (×3): qty 30, 30d supply, fill #0
  Filled 2021-07-23: qty 30, 30d supply, fill #1

## 2021-05-10 MED ORDER — BASAGLAR KWIKPEN 100 UNIT/ML ~~LOC~~ SOPN
20.0000 [IU] | PEN_INJECTOR | Freq: Every day | SUBCUTANEOUS | 2 refills | Status: DC
  Filled 2021-05-10 – 2021-05-20 (×2): qty 6, 30d supply, fill #0

## 2021-05-10 MED ORDER — BUDESONIDE-FORMOTEROL FUMARATE 160-4.5 MCG/ACT IN AERO
2.0000 | INHALATION_SPRAY | Freq: Two times a day (BID) | RESPIRATORY_TRACT | 2 refills | Status: DC
  Filled 2021-05-10 – 2021-05-20 (×2): qty 10.2, 30d supply, fill #0

## 2021-05-10 MED ORDER — INSULIN PEN NEEDLE 31G X 6 MM MISC
1.0000 | Freq: Two times a day (BID) | 2 refills | Status: DC
  Filled 2021-05-10 – 2021-05-20 (×2): qty 100, 50d supply, fill #0

## 2021-05-10 MED ORDER — INSULIN ASPART 100 UNIT/ML FLEXPEN
2.0000 [IU] | PEN_INJECTOR | Freq: Three times a day (TID) | SUBCUTANEOUS | 11 refills | Status: DC
  Filled 2021-05-10 – 2021-05-20 (×2): qty 9, 30d supply, fill #0

## 2021-05-10 MED ORDER — POTASSIUM CHLORIDE CRYS ER 20 MEQ PO TBCR
40.0000 meq | EXTENDED_RELEASE_TABLET | ORAL | Status: DC
  Administered 2021-05-10: 40 meq via ORAL
  Filled 2021-05-10: qty 2

## 2021-05-10 MED ORDER — DOXAZOSIN MESYLATE 8 MG PO TABS
8.0000 mg | ORAL_TABLET | Freq: Every day | ORAL | 2 refills | Status: DC
  Filled 2021-05-10 – 2021-05-20 (×2): qty 30, 30d supply, fill #0
  Filled 2021-07-23: qty 30, 30d supply, fill #1
  Filled 2021-09-24: qty 30, 30d supply, fill #2

## 2021-05-10 MED ORDER — AMLODIPINE BESYLATE 10 MG PO TABS
10.0000 mg | ORAL_TABLET | Freq: Every day | ORAL | 2 refills | Status: DC
  Filled 2021-05-10 – 2021-05-20 (×3): qty 30, 30d supply, fill #0
  Filled 2021-07-23 – 2021-07-24 (×2): qty 30, 30d supply, fill #1
  Filled 2021-09-24: qty 30, 30d supply, fill #2

## 2021-05-10 MED ORDER — INSULIN GLARGINE-YFGN 100 UNIT/ML ~~LOC~~ SOLN
20.0000 [IU] | Freq: Every day | SUBCUTANEOUS | Status: DC
  Filled 2021-05-10: qty 0.2

## 2021-05-10 MED ORDER — SODIUM CHLORIDE 0.9 % IV SOLN
4.0000 g | Freq: Once | INTRAVENOUS | Status: DC
  Filled 2021-05-10: qty 40

## 2021-05-10 MED ORDER — HYDRALAZINE HCL 100 MG PO TABS
100.0000 mg | ORAL_TABLET | Freq: Three times a day (TID) | ORAL | 2 refills | Status: DC
  Filled 2021-05-10 – 2021-05-20 (×3): qty 90, 30d supply, fill #0
  Filled 2021-07-23 – 2021-07-24 (×2): qty 90, 30d supply, fill #1

## 2021-05-10 MED ORDER — VICTOZA 18 MG/3ML ~~LOC~~ SOPN
1.8000 mg | PEN_INJECTOR | Freq: Every day | SUBCUTANEOUS | 2 refills | Status: DC
  Filled 2021-05-10 – 2021-05-20 (×2): qty 9, 30d supply, fill #0

## 2021-05-10 NOTE — Progress Notes (Signed)
Inpatient Diabetes Program Recommendations  AACE/ADA: New Consensus Statement on Inpatient Glycemic Control (2015)  Target Ranges:  Prepandial:   less than 140 mg/dL      Peak postprandial:   less than 180 mg/dL (1-2 hours)      Critically ill patients:  140 - 180 mg/dL   Lab Results  Component Value Date   GLUCAP 150 (H) 05/10/2021   HGBA1C 13.5 (H) 05/07/2021    Review of Glycemic Control  Latest Reference Range & Units 05/09/21 07:47 05/09/21 11:35 05/09/21 15:36 05/09/21 16:54 05/09/21 20:39 05/10/21 01:54 05/10/21 07:44 05/10/21 08:32  Glucose-Capillary 70 - 99 mg/dL 192 (H) 249 (H) 261 (H) 317 (H) 407 (H) 276 (H) 113 (H) 150 (H)   Diabetes history: DM 2 Outpatient Diabetes medications: Basaglar 10 units bid, Victoza 1.8 mg Weekly, Novolog? Current orders for Inpatient glycemic control:  Semglee 20 units Novolog 0-15 units tid, hs, and 0200  A1c 13.5% on 1/3  Spoke w/pt at bedside 1/6.  Consider: -  Add Novolog 4 units tid meal coverage if eating >50% of meals -  Fasting glucose 113 (within fasting goals <130 mg/dl) may want to increase Semglee back up to 25 units.  Thanks,  Tama Headings RN, MSN, BC-ADM Inpatient Diabetes Coordinator Team Pager [phone] (8a-5p)

## 2021-05-10 NOTE — Discharge Summary (Signed)
Physician Discharge Summary  DREYSON MISHKIN NFA:[id number] DOB: 01-28-1971 DOA: 05/07/2021  PCP: Camillia Herter, NP  Admit date: 05/07/2021 Discharge date: 05/10/2021  Admitted From: Home Disposition: Home  Recommendations for Outpatient Follow-up:  Follow up with PCP in 1-2 weeks Patient needs close follow-up with his nephrologist given his worsening renal failure, although patient declines hemodialysis at this point Please obtain BMP in one week to assess renal function Continue to monitor BP outpatient and adjust as needed Needs further adjustments in his insulin regimen, although he seems to be fairly resistant to change  Home Health: No Equipment/Devices: CPAP  Discharge Condition: Stable, but overall poor prognosis given his progressive renal failure and poorly controlled HTN and diabetes CODE STATUS: Full code Diet recommendation: Heart healthy/consistent carb regular diet with 1800 mL fluid restriction  History of present illness:  Matthew Odom is a 51 year old male with past medical history significant for type 2 diabetes mellitus, essential hypertension, CKD stage IIIb, combined chronic systolic and diastolic congestive heart failure, COPD who presented to [facility] as a transfer from [facility] with elevated blood sugar, somnolence, and difficulty sleeping.  Due to patient's mental status, wife at bedside contributes to the HPI.  Wife reports that he usually starts feeling poorly once his blood sugar gets below 150.  Patient was more lethargic today with dry mouth and complained of leg cramping.  When wife checked his blood sugar, it was noted to be 517 which is the highest she has ever seen.  She gave him his daily 20 units of Basaglar and Victoza injection.  She checked his glucose about an hour later and it was more elevated to 557 so she brought him to the ED for further evaluation.  Wife reports that he has not had his insulin for the past few days, also has not been taking his  antihypertensives as well as he is run out of some of his prescription medication.   In the ED, temperature 97.9 F, HR 101, RR 18, BP 198/143, SPO2 89% on room air.  Sodium 128, potassium 2.7, chloride 88, CO2 26, glucose 545, BUN 62, creatinine 6.57, calcium 6.2.  Magnesium 1.4.  Anion gap 14.  WBC 7.8, hemoglobin 14.1, platelets 304.  COVID-19 PCR negative.  Influenza A/B PCR negative.  Urinalysis with negative ketones.  Patient was started on IV fluids, given 10 mEq IV potassium and 40 mEq oral potassium, 1 g IV calcium gluconate and started on insulin infusion.  Hospitalist service consulted for further evaluation and management of hypoglycemia, altered mental status, hypertensive urgency, acute renal failure on CKD stage IIIb.  Hospital course:  Type 2 diabetes mellitus with hyperglycemia Hemoglobin A1c 13.5, poorly controlled.  Patient presenting with a glucose greater than 500 with apparently been out of his home medications for several days.  Likely also a factor of noncompliance when he does have his medications.  Diabetic educator was consulted and followed during hospital course.  Patient was started on insulin glargine and titrated to 20 units apparently daily with coverage during meals.  Patient is very resistant to higher doses of insulin; as patient states he does not feel well and more lethargic when his blood sugar is less than 150.  Discharging on insulin glargine 20 unit subcutaneously daily with NovoLog insulin sliding scale.  Continue Victoza.  Outpatient follow-up PCP for further guidance and adjustment of insulin regimen.   Hypertensive urgency BP on arrival 198/143.  Patient has been noncompliant with his home antihypertensives.  Resumed  home carvedilol 25 mg p.o. twice daily, doxazosin 8 mg p.o. daily, hydralazine 100 mg p.o. 3 times daily, amlodipine 10 mg p.o. daily.  Spironolactone, furosemide, losartan were discontinued due to worsening renal function.  Blood pressures were  better controlled with BP 128/78 at time of discharge.  Recommend follow-up with PCP for further guidance and monitoring of hypertension.   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  Creatinine on admission elevated to 6.57. FeNa = 1.0%; which is consistent with prerenal azotemia likely secondary to dehydration.  Discussed with patient need for possible dialysis, he declines.  Patient was supported with IV fluid hydration during hospitalization.  Patient continue with good urine output but no significant improvement in renal function with creatinine 6.42 at time of discharge.  Patient declines dialysis at this time but recommended that he follow-up within 1-2 weeks with his nephrologist for further evaluation and conversations.  Discussed with patient that unless he gets his diabetes, blood pressure under control with hopes that his renal function stabilizes; this may be a terminal event without hemodialysis.   Hyponatremia: Resolved  Repleted with IV fluid hydration during hospitalization.  Sodium 135 at time of discharge.  Recommend BMP 1 week.   Hypokalemia Hypomagnesemia Repleted during hospitalization.   Hypocalcemia Calcium 6.2; corrected for hypoalbuminemia = 7.5.  Continue calcium supplement 500 mg twice daily on discharge.  Repeat BMP 1 week.   Chronic combined systolic/diastolic congestive heart failure, compensated TTE 06/29/2018 with LVEF 60-10%, diastolic dysfunction, LA moderately dilated, RA mildly dilated. Continue carvedilol 25mg  p.o. twice daily.  ARB, spironolactone, furosemide discontinued due to worsening renal function.   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 Outpatient surveillance with PCP/cardiology   COPD Continue Symbicort.  On room air.   OSA Obtained CPAP unit for patient on discharge.  After discharge today, patient will be going to [facility].  third floor at 1 PM for CPAP education and fitting.   Morbid obesity Body mass index is 35.87 kg/m.  Discussed with patient needs for aggressive lifestyle changes/weight loss as this complicates all facets of care.  Outpatient follow-up with PCP.  Discharge Diagnoses:  Principal Problem:   Hyperglycemia due to diabetes mellitus ([place]) Active Problems:   Chronic combined systolic and diastolic CHF, NYHA class 3 (HCC)   Acute-on-chronic kidney injury ([place])   Hypokalemia   Hypocalcemia    Discharge Instructions  Discharge Instructions     Ambulatory referral to Nephrology   Complete by: As directed    Diet - low sodium heart healthy   Complete by: As directed    Increase activity slowly   Complete by: As directed       Allergies as of 05/10/2021       Reactions   Aspirin Hives   Nitroglycerin Other (See Comments)   Nitroglycerin paste will make blood pressure go up and cause headaches. The nitroglycerin tablets do fine.        Medication List     STOP taking these medications    calcitRIOL 0.25 MCG capsule Commonly known as: ROCALTROL   furosemide 40 MG tablet Commonly known as: LASIX   HYDROcodone-acetaminophen 5-325 MG tablet Commonly known as: Norco   hydrOXYzine 25 MG tablet Commonly known as: ATARAX   isosorbide mononitrate 30 MG 24 hr tablet Commonly known as: IMDUR   losartan 25 MG tablet Commonly known as: COZAAR   predniSONE 10 MG  tablet Commonly known as: DELTASONE   spironolactone 50 MG tablet Commonly known as: ALDACTONE       TAKE these medications    amLODipine 10 MG tablet Commonly known as: NORVASC Take 1 tablet (10 mg total) by mouth daily.   atorvastatin 80 MG tablet Commonly known as: LIPITOR Take 1 tablet (80 mg total) by mouth daily.   Basaglar KwikPen 100 UNIT/ML Inject 20 Units into the skin daily. What changed: how much to take   budesonide-formoterol 160-4.5 MCG/ACT inhaler Commonly known as: SYMBICORT Inhale 2  puffs into the lungs 2 (two) times daily.   calcium carbonate 1250 (500 Ca) MG tablet Commonly known as: OS-CAL - dosed in mg of elemental calcium Take 1 tablet (500 mg of elemental calcium total) by mouth 2 (two) times daily with a meal.   carvedilol 25 MG tablet Commonly known as: COREG Take 1 tablet (25 mg total) by mouth 2 (two) times daily with a meal.   ciclopirox 8 % solution Commonly known as: PENLAC APPLY TOPICALLY AT BEDTIME. APPLY TO AFFECTED TOENAILS ONCE DAILY FOR 48 WEEKS. REMOVE ONCE WEEKLY WITH NAIL POLISH REMOVER. What changed:  how much to take how to take this when to take this additional instructions   doxazosin 8 MG tablet Commonly known as: CARDURA Take 1 tablet (8 mg total) by mouth daily. Start taking on: May 11, 2021 What changed:  medication strength how much to take when to take this   hydrALAZINE 100 MG tablet Commonly known as: APRESOLINE Take 1 tablet (100 mg total) by mouth 3 (three) times daily. Take 1 tablet by mouth three times daily   Insulin Pen Needle 31G X 6 MM Misc Use as directed 2 (two) times daily.   Misc. Devices Misc Please provide patient with insurance approved CPAP with the following settings:  autopap 15-20.  Large size [employer] Full Face Mask Forma mask and heated humidification.   nitroGLYCERIN 0.4 MG SL tablet Commonly known as: NITROSTAT Place 1 tablet (0.4 mg total) under the tongue every 5 (five) minutes as needed for chest pain.   tamsulosin 0.4 MG Caps capsule Commonly known as: FLOMAX Take 1 capsule (0.4 mg total) by mouth at bedtime.   Victoza 18 MG/3ML Sopn Generic drug: liraglutide Inject 1.8 mg into the skin daily.               Durable Medical Equipment  (From admission, onward)           Start     Ordered   05/09/21 0810  For home use only DME continuous positive airway pressure (CPAP)  Once       Question Answer Comment  Length of Need Lifetime   Patient has OSA or probable  OSA Yes   Is the patient currently using CPAP in the home No   Settings Autotitration   CPAP supplies needed Mask, headgear, cushions, filters, heated tubing and water chamber      05/09/21 0810            Follow-up Information     Camillia Herter, NP. Schedule an appointment as soon as possible for a visit in 1 week(s).   Specialty: Nurse Practitioner Contact information: [place] [address] [phone]         [place], [place]. Schedule an appointment as soon as possible for a visit in 1 week(s).   Contact information: [address] [place] [address] [phone]  [place] Follow up.   Why: They will contact you about setting up a fitting appointment for your CPAP, whcih they have waiting for you Contact information: [place] [address] [phone]               Allergies  Allergen Reactions   Aspirin Hives   Nitroglycerin Other (See Comments)    Nitroglycerin paste will make blood pressure go up and cause headaches. The nitroglycerin tablets do fine.    Consultations: None   Procedures/Studies: No results found.   Subjective: Patient seen examined bedside, resting comfortably.  Eating breakfast sitting at edge of bed.  Family present.  Wishes to discharge today.  Has appointment for CPAP fitting this afternoon.  Discussed with patient that he needs better control of his blood pressure and diabetes to ensure stabilization of his renal function and if not this may be a terminal event as he does not wish for hemodialysis.  Appears that he has now progressed to CKD stage V.  No other specific complaints or concerns at this time.  Denies headache, no fever/chills/night sweats, no nausea/vomiting/diarrhea, no chest pain, palpitations, no shortness of breath, no abdominal pain, no weakness, no fatigue, no dizziness, no paresthesias.  No acute events  overnight per nursing staff.  Discharge Exam: Vitals:   05/09/21 2315 05/10/21 0542  BP:  128/78  Pulse:  86  Resp: (!) 22 18  Temp:  97.7 F (36.5 C)  SpO2:  91%   Vitals:   05/09/21 2043 05/09/21 2311 05/09/21 2315 05/10/21 0542  BP: (!) 138/92   128/78  Pulse: 84   86  Resp: 18 (!) 33 (!) 22 18  Temp: (!) 97.4 F (36.3 C)   97.7 F (36.5 C)  TempSrc: Oral   Oral  SpO2: 98%   91%  Weight:      Height:        General: Pt is alert, awake, not in acute distress, obese, chronically ill in appearance Cardiovascular: RRR, S1/S2 +, no rubs, no gallops Respiratory: CTA bilaterally, no wheezing, no rhonchi, on room air Abdominal: Soft, NT, ND, bowel sounds + Extremities: Trace bilateral peripheral edema, no cyanosis    The results of significant diagnostics from this hospitalization (including imaging, microbiology, ancillary and laboratory) are listed below for reference.     Microbiology: Recent Results (from the past 240 hour(s))  Resp Panel by RT-PCR (Flu A&B, Covid) Nasopharyngeal Swab     Status: None   Collection Time: 05/07/21  4:52 PM   Specimen: Nasopharyngeal Swab; Nasopharyngeal(NP) swabs in vial transport medium  Result Value Ref Range Status   SARS Coronavirus 2 by RT PCR NEGATIVE NEGATIVE Final    Comment: (NOTE) SARS-CoV-2 target nucleic acids are NOT DETECTED.  The SARS-CoV-2 RNA is generally detectable in upper respiratory specimens during the acute phase of infection. The lowest concentration of SARS-CoV-2 viral copies this assay can detect is 138 copies/mL. A negative result does not preclude SARS-Cov-2 infection and should not be used as the sole basis for treatment or other patient management decisions. A negative result may occur with  improper specimen collection/handling, submission of specimen other than nasopharyngeal swab, presence of viral mutation(s) within the areas targeted by this assay, and inadequate number of viral copies(<138  copies/mL). A negative result must be combined with clinical observations, patient history, and epidemiological information. The expected result is Negative.  Fact Sheet for Patients:  EntrepreneurPulse.com.au  Fact Sheet for  Healthcare Providers:  IncredibleEmployment.be  This test is no t yet approved or cleared by the [place] and  has been authorized for detection and/or diagnosis of SARS-CoV-2 by FDA under an Emergency Use Authorization (EUA). This EUA will remain  in effect (meaning this test can be used) for the duration of the COVID-19 declaration under Section 564(b)(1) of the Act, 21 U.S.C.section 360bbb-3(b)(1), unless the authorization is terminated  or revoked sooner.       Influenza A by PCR NEGATIVE NEGATIVE Final   Influenza B by PCR NEGATIVE NEGATIVE Final    Comment: (NOTE) The Xpert Xpress SARS-CoV-2/FLU/RSV plus assay is intended as an aid in the diagnosis of influenza from Nasopharyngeal swab specimens and should not be used as a sole basis for treatment. Nasal washings and aspirates are unacceptable for Xpert Xpress SARS-CoV-2/FLU/RSV testing.  Fact Sheet for Patients: EntrepreneurPulse.com.au  Fact Sheet for Healthcare Providers: IncredibleEmployment.be  This test is not yet approved or cleared by the [place] FDA and has been authorized for detection and/or diagnosis of SARS-CoV-2 by FDA under an Emergency Use Authorization (EUA). This EUA will remain in effect (meaning this test can be used) for the duration of the COVID-19 declaration under Section 564(b)(1) of the Act, 21 U.S.C. section 360bbb-3(b)(1), unless the authorization is terminated or revoked.  Performed at [facility], [address], [place], [place] [address]   MRSA Next Gen by PCR, Nasal     Status: None   Collection Time: 05/07/21  7:37 PM   Specimen: Nasal Mucosa; Nasal Swab   Result Value Ref Range Status   MRSA by PCR Next Gen NOT DETECTED NOT DETECTED Final    Comment: (NOTE) The GeneXpert MRSA Assay (FDA approved for NASAL specimens only), is one component of a comprehensive MRSA colonization surveillance program. It is not intended to diagnose MRSA infection nor to guide or monitor treatment for MRSA infections. Test performance is not FDA approved in patients less than 76 years old. Performed at [facility], [place] [address], [place], [place] [address]      Labs: BNP (last 3 results) No results for input(s): BNP in the last 8760 hours. Basic Metabolic Panel: Recent Labs  Lab 05/07/21 1415 05/07/21 2253 05/08/21 1018 05/08/21 2129 05/09/21 0305 05/09/21 1615 05/09/21 2130 05/10/21 0526  NA 128*   < > 134* 133* 137 134*  --  135  K 2.7*   < > 2.6* 3.2* 3.2* 3.7  --  2.9*  CL 88*   < > 100 104 105 105  --  113*  CO2 26   < > 23 22 21* 20*  --  18*  GLUCOSE 545*   < > 206* 341* 277* 300* 413* 143*  BUN 62*   < > 59* 59* 60* 58*  --  56*  CREATININE 6.57*   < > 6.11* 6.69* 6.12* 6.06*  --  6.42*  CALCIUM 6.2*   < > 6.2* 6.0* 6.2* 6.5*  --  6.2*  MG 1.4*  --  1.7 1.8 1.9  --   --  2.1   < > = values in this interval not displayed.   Liver Function Tests: Recent Labs  Lab 05/08/21 1018 05/09/21 0305 05/10/21 0526  AST  --  14*  --   ALT  --  13  --   ALKPHOS  --  95  --   BILITOT  --  0.5  --   PROT  --  5.5*  --  ALBUMIN 2.4* 2.4* 2.6*   No results for input(s): LIPASE, AMYLASE in the last 168 hours. No results for input(s): AMMONIA in the last 168 hours. CBC: Recent Labs  Lab 05/07/21 1415  WBC 7.8  HGB 14.1  HCT 42.2  MCV 79.0*  PLT 304   Cardiac Enzymes: No results for input(s): CKTOTAL, CKMB, CKMBINDEX, TROPONINI in the last 168 hours. BNP: Invalid input(s): POCBNP CBG: Recent Labs  Lab 05/09/21 1654 05/09/21 2039 05/10/21 0154 05/10/21 0744 05/10/21 0832  GLUCAP 317* 407* 276* 113* 150*    D-Dimer No results for input(s): DDIMER in the last 72 hours. Hgb A1c Recent Labs    05/07/21 2317  HGBA1C 13.5*   Lipid Profile No results for input(s): CHOL, HDL, LDLCALC, TRIG, CHOLHDL, LDLDIRECT in the last 72 hours. Thyroid function studies No results for input(s): TSH, T4TOTAL, T3FREE, THYROIDAB in the last 72 hours.  Invalid input(s): FREET3 Anemia work up No results for input(s): VITAMINB12, FOLATE, FERRITIN, TIBC, IRON, RETICCTPCT in the last 72 hours. Urinalysis    Component Value Date/Time   COLORURINE YELLOW 05/07/2021 1417   APPEARANCEUR CLEAR 05/07/2021 1417   LABSPEC 1.014 05/07/2021 1417   PHURINE 6.5 05/07/2021 1417   GLUCOSEU >1,000 (A) 05/07/2021 1417   HGBUR MODERATE (A) 05/07/2021 1417   BILIRUBINUR NEGATIVE 05/07/2021 1417   BILIRUBINUR negative 11/07/2020 1717   KETONESUR NEGATIVE 05/07/2021 1417   PROTEINUR >300 (A) 05/07/2021 1417   UROBILINOGEN 0.2 11/07/2020 1717   NITRITE NEGATIVE 05/07/2021 1417   LEUKOCYTESUR NEGATIVE 05/07/2021 1417   Sepsis Labs Invalid input(s): PROCALCITONIN,  WBC,  LACTICIDVEN Microbiology Recent Results (from the past 240 hour(s))  Resp Panel by RT-PCR (Flu A&B, Covid) Nasopharyngeal Swab     Status: None   Collection Time: 05/07/21  4:52 PM   Specimen: Nasopharyngeal Swab; Nasopharyngeal(NP) swabs in vial transport medium  Result Value Ref Range Status   SARS Coronavirus 2 by RT PCR NEGATIVE NEGATIVE Final    Comment: (NOTE) SARS-CoV-2 target nucleic acids are NOT DETECTED.  The SARS-CoV-2 RNA is generally detectable in upper respiratory specimens during the acute phase of infection. The lowest concentration of SARS-CoV-2 viral copies this assay can detect is 138 copies/mL. A negative result does not preclude SARS-Cov-2 infection and should not be used as the sole basis for treatment or other patient management decisions. A negative result may occur with  improper specimen collection/handling, submission of  specimen other than nasopharyngeal swab, presence of viral mutation(s) within the areas targeted by this assay, and inadequate number of viral copies(<138 copies/mL). A negative result must be combined with clinical observations, patient history, and epidemiological information. The expected result is Negative.  Fact Sheet for Patients:  EntrepreneurPulse.com.au  Fact Sheet for Healthcare Providers:  IncredibleEmployment.be  This test is no t yet approved or cleared by the [place] FDA and  has been authorized for detection and/or diagnosis of SARS-CoV-2 by FDA under an Emergency Use Authorization (EUA). This EUA will remain  in effect (meaning this test can be used) for the duration of the COVID-19 declaration under Section 564(b)(1) of the Act, 21 U.S.C.section 360bbb-3(b)(1), unless the authorization is terminated  or revoked sooner.       Influenza A by PCR NEGATIVE NEGATIVE Final   Influenza B by PCR NEGATIVE NEGATIVE Final    Comment: (NOTE) The Xpert Xpress SARS-CoV-2/FLU/RSV plus assay is intended as an aid in the diagnosis of influenza from Nasopharyngeal swab specimens and should not be used as a sole basis for  treatment. Nasal washings and aspirates are unacceptable for Xpert Xpress SARS-CoV-2/FLU/RSV testing.  Fact Sheet for Patients: EntrepreneurPulse.com.au  Fact Sheet for Healthcare Providers: IncredibleEmployment.be  This test is not yet approved or cleared by the [place] FDA and has been authorized for detection and/or diagnosis of SARS-CoV-2 by FDA under an Emergency Use Authorization (EUA). This EUA will remain in effect (meaning this test can be used) for the duration of the COVID-19 declaration under Section 564(b)(1) of the Act, 21 U.S.C. section 360bbb-3(b)(1), unless the authorization is terminated or revoked.  Performed at [facility], [address], [place], [place] [address]   MRSA Next Gen by PCR, Nasal     Status: None   Collection Time: 05/07/21  7:37 PM   Specimen: Nasal Mucosa; Nasal Swab  Result Value Ref Range Status   MRSA by PCR Next Gen NOT DETECTED NOT DETECTED Final    Comment: (NOTE) The GeneXpert MRSA Assay (FDA approved for NASAL specimens only), is one component of a comprehensive MRSA colonization surveillance program. It is not intended to diagnose MRSA infection nor to guide or monitor treatment for MRSA infections. Test performance is not FDA approved in patients less than 38 years old. Performed at [facility], [place] [address], [place], [place] [address]      Time coordinating discharge: Over 30 minutes  SIGNED:   Avonell Lenig J [place] DO  [facility] 05/10/2021, 10:37 AM

## 2021-05-10 NOTE — TOC Transition Note (Signed)
Transition of Care [facility]) - CM/SW Discharge Note   Patient Details  Name: SAHAJ BONA MRN: [id number] Date of Birth: [birth year]  Transition of Care [facility]) CM/SW Contact:  Trish Mage, LCSW Phone Number: 05/10/2021, 10:46 AM   Clinical Narrative:   Amedeo Gory with [facility] was able to work out appointment for patient today for fitting of CPAP, and she also arranged for delivery of CPAP to sleep study center where patient will pick it up post d/c at [facility] with patient to let him know plan. No further needs identified. TOC sign off.    Final next level of care: Home/Self Care Barriers to Discharge: No Barriers Identified   Patient Goals and CMS Choice        Discharge Placement                       Discharge Plan and Services   Discharge Planning Services: CM Consult                                 Social Determinants of Health (SDOH) Interventions     Readmission Risk Interventions No flowsheet data found.

## 2021-05-11 ENCOUNTER — Other Ambulatory Visit: Payer: Self-pay

## 2021-05-13 ENCOUNTER — Other Ambulatory Visit: Payer: Self-pay

## 2021-05-13 ENCOUNTER — Telehealth: Payer: Self-pay

## 2021-05-13 NOTE — Telephone Encounter (Signed)
Patient received his CPAP machine 05/10/2021 after receiving education regarding use and care of the machine. He also signed the waiver for the [employer]/CPAP assistance program

## 2021-05-13 NOTE — Telephone Encounter (Signed)
Transition Care Management Unsuccessful Follow-up Telephone Call  Date of discharge and from where:  05/10/2021, [facility]   Attempts:  1st Attempt  Reason for unsuccessful TCM follow-up call:  Left voice message on # [phone], call back requested to this CM. Need to schedule hospital follow up appointment with PCP.

## 2021-05-14 ENCOUNTER — Telehealth: Payer: Self-pay

## 2021-05-14 DIAGNOSIS — N184 Chronic kidney disease, stage 4 (severe): Secondary | ICD-10-CM | POA: Diagnosis not present

## 2021-05-14 NOTE — Telephone Encounter (Signed)
Transition Care Management Unsuccessful Follow-up Telephone Call  Date of discharge and from where:  05/10/2021, [facility]  Attempts:  2nd Attempt  Reason for unsuccessful TCM follow-up call:  Left voice message on # [phone], call back requested to this CM. Need to schedule hospital follow up appointment with PCP.

## 2021-05-15 ENCOUNTER — Other Ambulatory Visit: Payer: Self-pay

## 2021-05-15 ENCOUNTER — Telehealth: Payer: Self-pay

## 2021-05-15 MED ORDER — CALCIUM 500 MG PO TABS
ORAL_TABLET | ORAL | 2 refills | Status: DC
Start: 2021-05-14 — End: 2021-11-12

## 2021-05-15 NOTE — Telephone Encounter (Signed)
Transition Care Management Follow-up Telephone Call Date of discharge and from where: 05/10/2021, [facility]  How have you been since you were released from the hospital? He said he feels okay, he just has no energy.  He saw the nephrologist yesterday and is now planning to start dialysis. He is expecting a call to schedule catheter placement for dialysis access. Any questions or concerns? Yes -noted above, no energy.  He has a glucometer and said that his blood sugars have been running 170-200. This morning his blood sugar was " about 240" but that was after he ate.  He has a home BP machine. His BP today was "about" 190/94. He said he has been taking all of his medications as ordered.  He has received his CPAP machine and is very grateful to have it.  He said he had been getting up 3-5x/ night and now he has only been getting up once around 0500-0600.    Items Reviewed: Did the pt receive and understand the discharge instructions provided? Yes  Medications obtained and verified? Yes  - he said he has all of his medications and did not have any questions about his med regime  Other? No  Any new allergies since your discharge? No  Dietary orders reviewed? Yes - he said he is trying to make changes with his diet. Would like to loose about 50 lbs. He said he was not instructed to adhere to a fluid restriction.  Do you have support at home? Yes   Home Care and Equipment/Supplies: Were home health services ordered? no If so, what is the name of the agency? N/a  Has the agency set up a time to come to the patient's home? not applicable Were any new equipment or medical supplies ordered?  No What is the name of the medical supply agency? N/a Were you able to get the supplies/equipment? not applicable Do you have any questions related to the use of the equipment or supplies? No  Functional Questionnaire: (I = Independent and D = Dependent) ADLs: independent.   Follow up appointments  reviewed:  PCP Hospital f/u appt confirmed? Yes  Scheduled to see Durene Fruits, NP on 05/21/2021 @ 1040. [facility] f/u appt confirmed? Yes   - he saw the nephrologist yesterday.  Are transportation arrangements needed? No  If their condition worsens, is the pt aware to call PCP or go to the Emergency Dept.? Yes Was the patient provided with contact information for the PCP's office or ED? Yes Was to pt encouraged to call back with questions or concerns? Yes

## 2021-05-16 ENCOUNTER — Other Ambulatory Visit (HOSPITAL_COMMUNITY): Payer: Self-pay | Admitting: Internal Medicine

## 2021-05-16 ENCOUNTER — Other Ambulatory Visit: Payer: Self-pay | Admitting: Radiology

## 2021-05-16 DIAGNOSIS — N186 End stage renal disease: Secondary | ICD-10-CM

## 2021-05-17 ENCOUNTER — Other Ambulatory Visit: Payer: Self-pay

## 2021-05-17 ENCOUNTER — Other Ambulatory Visit: Payer: Self-pay | Admitting: Internal Medicine

## 2021-05-18 NOTE — Progress Notes (Signed)
TRANSITION OF CARE VISIT   Primary Care Provider (PCP):    Durene Fruits, NP                                                    [facility] [address] [place]                   [place],  [place]  [address]     Phone: [phone] Fax: [phone]     Date of Admission: 05/07/2021   Date of Discharge: 05/10/2021  Transitions of Care Call: 05/15/2021  Discharged from: [facility]   Discharge Diagnosis:  Principal Problem:   Hyperglycemia due to diabetes mellitus ([place]) Active Problems:   Chronic combined systolic and diastolic CHF, NYHA class 3 (HCC)   Acute-on-chronic kidney injury ([place])   Hypokalemia   Hypocalcemia   Summary of Admission per DO note:  Recommendations for Outpatient Follow-up:  Follow up with PCP in 1-2 weeks Patient needs close follow-up with his nephrologist given his worsening renal failure, although patient declines hemodialysis at this point Please obtain BMP in one week to assess renal function Continue to monitor BP outpatient and adjust as needed Needs further adjustments in his insulin regimen, although he seems to be fairly resistant to change   Home Health: No Equipment/Devices: CPAP   Discharge Condition: Stable, but overall poor prognosis given his progressive renal failure and poorly controlled HTN and diabetes CODE STATUS: Full code Diet recommendation: Heart healthy/consistent carb regular diet with 1800 mL fluid restriction   History of present illness:   Matthew Odom is a 51 year old male with past medical history significant for type 2 diabetes mellitus, essential hypertension, CKD stage IIIb, combined chronic systolic and diastolic congestive heart failure, COPD who presented to [facility] as a transfer from [facility] with elevated blood sugar, somnolence, and difficulty sleeping.  Due to patient's mental status, wife at bedside  contributes to the HPI.  Wife reports that he usually starts feeling poorly once his blood sugar gets below 150.  Patient was more lethargic today with dry mouth and complained of leg cramping.  When wife checked his blood sugar, it was noted to be 517 which is the highest she has ever seen.  She gave him his daily 20 units of Basaglar and Victoza injection.  She checked his glucose about an hour later and it was more elevated to 557 so she brought him to the ED for further evaluation.  Wife reports that he has not had his insulin for the past few days, also has not been taking his antihypertensives as well as he is run out of some of his prescription medication.   In the ED, temperature 97.9 F, HR 101, RR 18, BP 198/143, SPO2 89% on room air.  Sodium 128, potassium 2.7, chloride 88, CO2 26, glucose 545, BUN 62, creatinine 6.57, calcium 6.2.  Magnesium 1.4.  Anion gap 14.  WBC 7.8, hemoglobin 14.1, platelets 304.  COVID-19 PCR negative.  Influenza A/B PCR negative.  Urinalysis with negative ketones.  Patient was started on IV fluids, given 10 mEq IV potassium and 40 mEq oral potassium, 1 g IV calcium gluconate and started on insulin infusion.  Hospitalist service consulted for further evaluation and management of hypoglycemia, altered mental status, hypertensive urgency,  acute renal failure on CKD stage IIIb.   Hospital course:   Type 2 diabetes mellitus with hyperglycemia Hemoglobin A1c 13.5, poorly controlled.  Patient presenting with a glucose greater than 500 with apparently been out of his home medications for several days.  Likely also a factor of noncompliance when he does have his medications.  Diabetic educator was consulted and followed during hospital course.  Patient was started on insulin glargine and titrated to 20 units apparently daily with coverage during meals.  Patient is very resistant to higher doses of insulin; as patient states he does not feel well and more lethargic when his blood  sugar is less than 150.  Discharging on insulin glargine 20 unit subcutaneously daily with NovoLog insulin sliding scale.  Continue Victoza.  Outpatient follow-up PCP for further guidance and adjustment of insulin regimen.   Hypertensive urgency BP on arrival 198/143.  Patient has been noncompliant with his home antihypertensives.  Resumed home carvedilol 25 mg p.o. twice daily, doxazosin 8 mg p.o. daily, hydralazine 100 mg p.o. 3 times daily, amlodipine 10 mg p.o. daily.  Spironolactone, furosemide, losartan were discontinued due to worsening renal function.  Blood pressures were better controlled with BP 128/78 at time of discharge.  Recommend follow-up with PCP for further guidance and monitoring of hypertension.   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  Creatinine on admission elevated to 6.57. FeNa = 1.0%; which is consistent with prerenal azotemia likely secondary to dehydration.  Discussed with patient need for possible dialysis, he declines.  Patient was supported with IV fluid hydration during hospitalization.  Patient continue with good urine output but no significant improvement in renal function with creatinine 6.42 at time of discharge.  Patient declines dialysis at this time but recommended that he follow-up within 1-2 weeks with his nephrologist for further evaluation and conversations.  Discussed with patient that unless he gets his diabetes, blood pressure under control with hopes that his renal function stabilizes; this may be a terminal event without hemodialysis.   Hyponatremia: Resolved  Repleted with IV fluid hydration during hospitalization.  Sodium 135 at time of discharge.  Recommend BMP 1 week.   Hypokalemia Hypomagnesemia Repleted during hospitalization.   Hypocalcemia Calcium 6.2; corrected for hypoalbuminemia = 7.5.  Continue calcium supplement 500 mg twice daily on discharge.  Repeat BMP 1 week.    Chronic combined systolic/diastolic congestive heart failure, compensated TTE 06/29/2018 with LVEF 68-12%, diastolic dysfunction, LA moderately dilated, RA mildly dilated. Continue carvedilol 25mg  p.o. twice daily.  ARB, spironolactone, furosemide discontinued due to worsening renal function.   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 Outpatient surveillance with PCP/cardiology   COPD Continue Symbicort.  On room air.   OSA Obtained CPAP unit for patient on discharge.  After discharge today, patient will be going to [facility]. third floor at 1 PM for CPAP education and fitting.   Morbid obesity Body mass index is 35.87 kg/m.  Discussed with patient needs for aggressive lifestyle changes/weight loss as this complicates all facets of care.  Outpatient follow-up with PCP.   TODAY's VISIT:  Patient is accompanied by his wife.   Type 2 diabetes mellitus with hyperglycemia follow-up: Taking current regimen, Insulin Glargine, Novolog Insulin, and Victoza as prescribed.    Hypertensive urgency follow-up: Taking current regimen, Carvedilol, Doxazosin, Hydralazine, and Amlodipine as prescribed.   Acute renal failure on CKD stage IIIb; likely now  progressed to CKD stage V follow-up: Reports established with Nephrology. Wife reports dialysis schedule will be Tuesday, Thursday, Saturday and begins next week.   Chronic combined systolic/diastolic congestive heart failure, compensated follow-up: Taking current regimen, Carvedilol, as prescribed.   COPD follow-up: Taking current regimen, Symbicort, as prescribed. Requesting Albuterol nebulizer instead of Albuterol inhaler.   OSA follow-up: Reports completed CPAP education and fitting course.   Generalized anxiety disorder follow-up: Requesting Zoloft for management. Reports did not take Hydroxyzine from previous appointment with PCP. Declines referral to Psychiatry.  Depression screen [facility]  2/9 05/21/2021 01/29/2021 11/20/2020 04/24/2020 01/20/2020  Decreased Interest 2 2 1 2  0  Down, Depressed, Hopeless 0 1 0 1 0  PHQ - 2 Score 2 3 1 3  0  Altered sleeping 3 3 2 3 3   Tired, decreased energy 2 2 1 3 1   Change in appetite 0 0 1 1 1   Feeling bad or failure about yourself  0 0 0 0 0  Trouble concentrating 0 0 0 0 0  Moving slowly or fidgety/restless 0 1 0 0 0  Suicidal thoughts 0 0 0 0 0  PHQ-9 Score 7 9 5 10 5   Difficult doing work/chores Not difficult at all Not difficult at all Not difficult at all - -    Patient/Caregiver self-reported problems/concerns: see above   MEDICATIONS  Medication Reconciliation conducted with patient/caregiver? (Yes/ No): Yes   New medications prescribed/discontinued upon discharge? (Yes/No):  Yes  Barriers identified related to medications: No  LABS  Lab Reviewed (Yes/No/NA):  Yes  PHYSICAL EXAM:  Physical Exam HENT:     Head: Normocephalic and atraumatic.  Eyes:     Extraocular Movements: Extraocular movements intact.     Conjunctiva/sclera: Conjunctivae normal.     Pupils: Pupils are equal, round, and reactive to light.  Cardiovascular:     Rate and Rhythm: Normal rate and regular rhythm.     Pulses: Normal pulses.     Heart sounds: Normal heart sounds.  Pulmonary:     Effort: Pulmonary effort is normal.     Breath sounds: Wheezing present.  Musculoskeletal:     Cervical back: Normal range of motion and neck supple.  Neurological:     General: No focal deficit present.     Mental Status: He is alert and oriented to person, place, and time.  Psychiatric:        Mood and Affect: Mood normal.        Behavior: Behavior normal.    ASSESSMENT & PLAN: 1. Hospital discharge follow-up: - Reviewed hospital course, current medications, ensured proper follow-up in place, and addressed concerns.   2. Type 2 diabetes mellitus with hyperglycemia, with long-term current use of insulin ([place]): - Hemoglobin A1c 13.5% on 05/08/2021. -  Continue Insulin Glargine, Novolog Insulin, and Victoza as prescribed. No refills needed as of present.  - Discussed the importance of healthy eating habits, low-carbohydrate diet, low-sugar diet, regular aerobic exercise (at least 150 minutes a week as tolerated) and medication compliance to achieve or maintain control of diabetes. - Referral to Endocrinology for further evaluation and management.  - Ambulatory referral to Endocrinology  3. Hypertensive urgency: - Blood pressure at goal today in office.  - Continue Carvedilol, Doxazosin, Hydralazine, and Amlodipine as prescribed. No refills needed as of present.  - Counseled on blood pressure goal of less than 130/80, low-sodium, DASH diet, medication compliance, 150 minutes of moderate intensity exercise per week as tolerated. Discussed medication compliance, adverse effects. - Referral to  Cardiology for further evaluation and management.  - Ambulatory referral to Cardiology  4. Stage 3b chronic kidney disease [facility]): - Keep all scheduled appointments with Nephrology. Patient reports to begin dialysis soon on a Tuesday, Thursday, Saturday schedule.   5. Hyponatremia: - Update BMP. - Basic Metabolic Panel  6. Hypokalemia: 7. Hypomagnesemia: - Repleted during hospitalization.  8. Hypocalcemia: - Update BMP. - Basic Metabolic Panel  9. Chronic combined systolic and diastolic CHF, NYHA class 3 ([place]): 10. Ascending aorta dilation ([place]): - Continue Carvedilol as prescribed. No refills needed as of present.  - Referral to Cardiology for further evaluation and management.  - Ambulatory referral to Cardiology  11. Chronic obstructive pulmonary disease, unspecified COPD type ([place]): - Continue Budesonide-Formoterol inhaler and Albuterol nebulizer as prescribed. No refills needed as of present.  - Referral to Pulmonology for further evaluation and management.  - Ambulatory referral to Pulmonology - albuterol (PROVENTIL) (2.5 MG/3ML) 0.083%  nebulizer solution; Take 3 mLs (2.5 mg total) by nebulization every 6 (six) hours as needed for wheezing or shortness of breath.  Dispense: 150 mL; Refill: 2 - For home use only DME Nebulizer machine  12. OSA (obstructive sleep apnea): - Continue CPAP use.   13. Generalized anxiety disorder: - Patient denies thoughts of self-harm, suicidal ideations, homicidal ideations. - Begin Sertraline as prescribed.  Do not drink alcohol or use illicit substances with with this medication.  Avoid driving or hazardous activity until you know how this medication will affect you. Your reactions could be impaired. Dizziness or fainting can cause falls, accidents, or severe injuries. Common side effects include drowsiness, nausea, constipation, loss of appetite, dry mouth, increased sweating. Call your provider if you have pounding heartbeats or fluttering in your chest, a light-headed feeling like you may pass out, easy bruising/unusal bleeding, vision change, difficult or painful urination, impotence/sexual problems, liver problems (right-sided upper stomach pain, itching, dark urine, yellowing of skin or eyes/jaundice, low levels of sodium in the body (headache, confusion, slurred speech, severe weakness, vomiting, loss of coordination, feeling unsteady), or manic episodes (racing thoughts, increased energy, decreased need for sleep, risk-taking behavior, being agitated, talkative) Seek medical attention immediately if you have symptoms of serotonin syndrome such as agitation, hallucinations, fever, sweating, shivering, fast heart rate, muscle stiffness, twitching, loss of coordination, nausea, vomiting, or diarrhea Report any new or worsening symptoms to your provider, such as but not limited to: mood or behavior changes, anxiety, panic attacks, trouble sleeping, or if you feel impulsive, irritable, agitated, hostile, aggressive, restless, hyperactive (mentally or physically), more depressed, or have thoughts about  suicide or hurting yourself - Patient declined referral to Psychiatry.  - Follow-up with primary provider in 4 weeks or sooner if needed.  - sertraline (ZOLOFT) 25 MG tablet; Take 1 tablet (25 mg total) by mouth daily.  Dispense: 30 tablet; Refill: 0   PATIENT EDUCATION PROVIDED: See AVS   FOLLOW-UP (Include any further testing or referrals):  - Follow-up with primary provider in 4 weeks or sooner if needed for anxiety depression.  - Keep all scheduled appointments with Nephrology.  - Referral to Endocrinology.  - Referral to Cardiology.  - Referral to Pulmonology.

## 2021-05-20 ENCOUNTER — Other Ambulatory Visit (HOSPITAL_COMMUNITY): Payer: Self-pay | Admitting: Internal Medicine

## 2021-05-20 ENCOUNTER — Encounter (HOSPITAL_COMMUNITY): Payer: Self-pay

## 2021-05-20 ENCOUNTER — Other Ambulatory Visit: Payer: Self-pay | Admitting: Radiology

## 2021-05-20 ENCOUNTER — Ambulatory Visit (HOSPITAL_COMMUNITY)
Admission: RE | Admit: 2021-05-20 | Discharge: 2021-05-20 | Disposition: A | Discharge status: Home / Self Care | Payer: Medicaid Other | Source: Ambulatory Visit | Attending: Internal Medicine | Admitting: Internal Medicine

## 2021-05-20 ENCOUNTER — Other Ambulatory Visit: Payer: Self-pay

## 2021-05-20 DIAGNOSIS — N185 Chronic kidney disease, stage 5: Secondary | ICD-10-CM | POA: Insufficient documentation

## 2021-05-20 DIAGNOSIS — E1122 Type 2 diabetes mellitus with diabetic chronic kidney disease: Secondary | ICD-10-CM | POA: Insufficient documentation

## 2021-05-20 DIAGNOSIS — I132 Hypertensive heart and chronic kidney disease with heart failure and with stage 5 chronic kidney disease, or end stage renal disease: Secondary | ICD-10-CM | POA: Diagnosis present

## 2021-05-20 DIAGNOSIS — I5042 Chronic combined systolic (congestive) and diastolic (congestive) heart failure: Secondary | ICD-10-CM | POA: Diagnosis not present

## 2021-05-20 DIAGNOSIS — N179 Acute kidney failure, unspecified: Secondary | ICD-10-CM | POA: Diagnosis not present

## 2021-05-20 DIAGNOSIS — J449 Chronic obstructive pulmonary disease, unspecified: Secondary | ICD-10-CM | POA: Diagnosis not present

## 2021-05-20 DIAGNOSIS — N186 End stage renal disease: Secondary | ICD-10-CM

## 2021-05-20 DIAGNOSIS — Z9114 Patient's other noncompliance with medication regimen: Secondary | ICD-10-CM | POA: Diagnosis not present

## 2021-05-20 HISTORY — PX: IR FLUORO GUIDE CV LINE RIGHT: IMG2283

## 2021-05-20 HISTORY — PX: IR US GUIDE VASC ACCESS RIGHT: IMG2390

## 2021-05-20 LAB — GLUCOSE, CAPILLARY
Glucose-Capillary: 83 mg/dL (ref 70–99)
Glucose-Capillary: 91 mg/dL (ref 70–99)

## 2021-05-20 MED ORDER — MIDAZOLAM HCL 2 MG/2ML IJ SOLN
INTRAMUSCULAR | Status: AC | PRN
Start: 2021-05-20 — End: 2021-05-20
  Administered 2021-05-20 (×2): .5 mg via INTRAVENOUS
  Administered 2021-05-20: 1 mg via INTRAVENOUS

## 2021-05-20 MED ORDER — ALBUTEROL SULFATE (2.5 MG/3ML) 0.083% IN NEBU
INHALATION_SOLUTION | RESPIRATORY_TRACT | Status: AC
  Administered 2021-05-20: 2.5 mg via RESPIRATORY_TRACT
  Filled 2021-05-20: qty 3

## 2021-05-20 MED ORDER — ALBUTEROL SULFATE (2.5 MG/3ML) 0.083% IN NEBU: 2.5000 mg | INHALATION_SOLUTION | Freq: Once | RESPIRATORY_TRACT | Status: AC

## 2021-05-20 MED ORDER — SODIUM CHLORIDE 0.9 % IV SOLN
INTRAVENOUS | Status: AC | PRN
  Administered 2021-05-20: 10 mL/h via INTRAVENOUS

## 2021-05-20 MED ORDER — SODIUM CHLORIDE 0.9 % IV SOLN: INTRAVENOUS | Status: DC

## 2021-05-20 MED ORDER — CEFAZOLIN SODIUM-DEXTROSE 2-4 GM/100ML-% IV SOLN
INTRAVENOUS | Status: AC
  Filled 2021-05-20: qty 100

## 2021-05-20 MED ORDER — FENTANYL CITRATE (PF) 100 MCG/2ML IJ SOLN
INTRAMUSCULAR | Status: AC | PRN
Start: 2021-05-20 — End: 2021-05-20
  Administered 2021-05-20 (×2): 25 ug via INTRAVENOUS
  Administered 2021-05-20: 50 ug via INTRAVENOUS

## 2021-05-20 MED ORDER — LIDOCAINE HCL 1 % IJ SOLN
INTRAMUSCULAR | Status: AC
  Filled 2021-05-20: qty 20

## 2021-05-20 MED ORDER — HEPARIN SODIUM (PORCINE) 1000 UNIT/ML IJ SOLN
INTRAMUSCULAR | Status: AC
  Filled 2021-05-20: qty 10

## 2021-05-20 MED ORDER — CEFAZOLIN SODIUM-DEXTROSE 2-4 GM/100ML-% IV SOLN
2.0000 g | INTRAVENOUS | Status: AC
  Administered 2021-05-20: 2 g via INTRAVENOUS

## 2021-05-20 MED ORDER — FENTANYL CITRATE (PF) 100 MCG/2ML IJ SOLN
INTRAMUSCULAR | Status: AC
  Filled 2021-05-20: qty 2

## 2021-05-20 MED ORDER — MIDAZOLAM HCL 2 MG/2ML IJ SOLN
INTRAMUSCULAR | Status: AC
  Filled 2021-05-20: qty 2

## 2021-05-20 NOTE — Procedures (Signed)
Interventional Radiology Procedure Note ? ?Procedure: RT IJ HD CATH   ? ?Complications: None ? ?Estimated Blood Loss:  MIN ? ?Findings: ?TIP SVCRA   ? ?M. TREVOR Krystyne Tewksbury, MD ? ? ? ?

## 2021-05-20 NOTE — H&P (Signed)
Chief Complaint: Patient was seen in consultation today for tunneled dialysis catheter placement at the request of Kruska,Lindsay A  Referring Physician(s): Jannifer Hick A  Supervising Physician: Daryll Brod  Patient Status: [facility] - Out-pt  History of Present Illness: Matthew Odom is a 51 y.o. male   Poorly controlled HTN and DM Progressive renal failure Was seen in ED 05/07/21 for elevated blood sugar ; somnolence Non compliant with most meds Hyperglycemia; hypertensive urgency; A/CKD-- now stage 5 Systolic/diastolic congestive heart failure COPD  Was discharged 05/10/21 To see Nephrology as OP Dr Johnney Ou has ordered tunneled dialysis catheter placement Pt is not sure as to when dialysis will start  Remains noncompliant with meds    Past Medical History:  Diagnosis Date   Cocaine use    Diabetes mellitus without complication ([place])    Enlarged heart    History of degenerative disc disease    Hyperlipidemia    Hypertension    NSTEMI (non-ST elevated myocardial infarction) ([place])    Sciatic nerve pain     Past Surgical History:  Procedure Laterality Date   CARDIAC CATHETERIZATION     NO PAST SURGERIES      Allergies: Aspirin and Nitroglycerin  Medications: Prior to Admission medications   Medication Sig Start Date End Date Taking? Authorizing Provider  budesonide-formoterol (SYMBICORT) 160-4.5 MCG/ACT inhaler Inhale 2 puffs into the lungs 2 (two) times daily. 05/10/21 08/08/21 Yes [place] Donnamarie Poag, DO  Calcium 500 MG tablet Take 1 tablet by mouth 3 times daily 05/14/21  Yes Justin Mend, MD  carvedilol (COREG) 25 MG tablet Take 1 tablet (25 mg total) by mouth 2 (two) times daily with a meal. 05/10/21 08/08/21 Yes [place] Eric J, DO  hydrALAZINE (APRESOLINE) 100 MG tablet Take 1 tablet (100 mg total) by mouth 3 (three) times daily. Take 1 tablet by mouth three times daily 05/10/21 08/08/21 Yes [place] Donnamarie Poag, DO  insulin aspart (NOVOLOG) 100 UNIT/ML FlexPen Inject  2-10 Units into the skin 3 (three) times daily with meals. 2 units for glucose 151-200, 4 units for glucose 201-250, 6 units for glucose 251-300, 8 units for glucose 301-350, 10 units for glucose 351-400 05/10/21  Yes [place] Eric J, DO  Insulin Glargine (BASAGLAR KWIKPEN) 100 UNIT/ML Inject 20 Units into the skin daily. 05/10/21 08/08/21 Yes [place] Eric J, DO  liraglutide (VICTOZA) 18 MG/3ML SOPN Inject 1.8 mg into the skin daily. 05/10/21 08/08/21 Yes [place] Eric J, DO  tamsulosin (FLOMAX) 0.4 MG CAPS capsule Take 1 capsule (0.4 mg total) by mouth at bedtime. 05/10/21 08/08/21 Yes [place] Eric J, DO  amLODipine (NORVASC) 10 MG tablet Take 1 tablet (10 mg total) by mouth daily. 05/10/21 08/08/21  [place] Donnamarie Poag, DO  atorvastatin (LIPITOR) 80 MG tablet Take 1 tablet (80 mg total) by mouth daily. 05/10/21 08/08/21  [place] Donnamarie Poag, DO  calcium carbonate (OS-CAL - DOSED IN MG OF ELEMENTAL CALCIUM) 1250 (500 Ca) MG tablet Take 1 tablet (500 mg of elemental calcium total) by mouth 2 (two) times daily with a meal. 05/10/21 08/08/21  [place] Eric J, DO  ciclopirox (PENLAC) 8 % solution APPLY TOPICALLY AT BEDTIME. APPLY TO AFFECTED TOENAILS ONCE DAILY FOR 48 WEEKS. REMOVE ONCE WEEKLY WITH NAIL POLISH REMOVER. Patient taking differently: Apply 1 application topically at bedtime. Remove once weekly 07/27/20 07/27/21  Marzetta Board, DPM  doxazosin (CARDURA) 8 MG tablet Take 1 tablet (8 mg total) by mouth daily. 05/11/21 08/09/21  [place] Donnamarie Poag, DO  Insulin Pen  Needle 31G X 6 MM MISC Use as directed 2 (two) times daily. 05/10/21   [place] Eric J, DO  Misc. Devices MISC Please provide patient with insurance approved CPAP with the following settings:  autopap 15-20.  Large size [employer] Full Face Mask Forma mask and heated humidification. Patient taking differently: Please provide patient with insurance approved CPAP with the following settings:  autopap 15-20.  Large size [employer] Full Face Mask Forma mask and heated  humidification. 12/25/19   [name], NP  nitroGLYCERIN (NITROSTAT) 0.4 MG SL tablet Place 1 tablet (0.4 mg total) under the tongue every 5 (five) minutes as needed for chest pain. 05/10/21   [place] Eric J, DO     Family History  Problem Relation Age of Onset   Heart attack Mother    Hypertension Mother    Diabetes Mother    Heart disease Sister    Hypertension Sister    Hypertension Father    Emphysema Father     Social History   Socioeconomic History   Marital status: Married    Spouse name: Not on file   Number of children: Not on file   Years of education: Not on file   Highest education level: Not on file  Occupational History   Not on file  Tobacco Use   Smoking status: Every Day    Packs/day: 1.00    Years: 40.00    Pack years: 40.00    Types: Cigarettes   Smokeless tobacco: Never  Substance and Sexual Activity   Alcohol use: Not Currently    Comment: occasionally   Drug use: Yes    Types: Cocaine   Sexual activity: Yes  Other Topics Concern   Not on file  Social History Narrative   Not on file   Social Determinants of Health   Financial Resource Strain: Not on file  Food Insecurity: Not on file  Transportation Needs: Not on file  Physical Activity: Not on file  Stress: Not on file  Social Connections: Not on file     Review of Systems: A 12 point ROS discussed and pertinent positives are indicated in the HPI above.  All other systems are negative.  Review of Systems  Constitutional:  Positive for activity change and fatigue.  Respiratory:  Positive for shortness of breath. Negative for cough and wheezing.   Cardiovascular:  Negative for chest pain.  Gastrointestinal:  Positive for nausea. Negative for abdominal pain.  Psychiatric/Behavioral:  Negative for behavioral problems and confusion.    Vital Signs: BP (!) 149/90    Pulse 83    Temp 97.6 F (36.4 C) (Oral)    Resp 18    Ht 5\' 11"  (1.803 m)    Wt 265 lb (120.2 kg)    SpO2 97%    BMI  36.96 kg/m   Physical Exam Vitals reviewed.  Constitutional:      Comments: Sleepy Alert; and coherent  HENT:     Mouth/Throat:     Mouth: Mucous membranes are moist.  Cardiovascular:     Rate and Rhythm: Normal rate and regular rhythm.     Heart sounds: Normal heart sounds.  Pulmonary:     Breath sounds: Normal breath sounds. No wheezing.  Musculoskeletal:        General: Normal range of motion.  Skin:    General: Skin is warm.  Neurological:     Mental Status: He is alert and oriented to person, place, and time.  Psychiatric:  Behavior: Behavior normal.    Imaging: SLEEP STUDY DOCUMENTS  Result Date: 05/15/2021 Ordered by an unspecified provider.   Labs:  CBC: Recent Labs    11/07/20 1630 05/07/21 1415  WBC 6.7 7.8  HGB 13.6 14.1  HCT 41.8 42.2  PLT 292 304    COAGS: No results for input(s): INR, APTT in the last 8760 hours.  BMP: Recent Labs    05/08/21 2129 05/09/21 0305 05/09/21 1615 05/09/21 2130 05/10/21 0526  NA 133* 137 134*  --  135  K 3.2* 3.2* 3.7  --  2.9*  CL 104 105 105  --  113*  CO2 22 21* 20*  --  18*  GLUCOSE 341* 277* 300* 413* 143*  BUN 59* 60* 58*  --  56*  CALCIUM 6.0* 6.2* 6.5*  --  6.2*  CREATININE 6.69* 6.12* 6.06*  --  6.42*  GFRNONAA 9* 10* 11*  --  10*    LIVER FUNCTION TESTS: Recent Labs    11/07/20 1630 11/16/20 1347 05/08/21 1018 05/09/21 0305 05/10/21 0526  BILITOT 0.3 0.4  --  0.5  --   AST 9 11  --  14*  --   ALT  --  16  --  13  --   ALKPHOS 137* 105  --  95  --   PROT 6.4 6.5  --  5.5*  --   ALBUMIN 4.1 4.1 2.4* 2.4* 2.6*    TUMOR MARKERS: No results for input(s): AFPTM, CEA, CA199, CHROMGRNA in the last 8760 hours.  Assessment and Plan:  Acute on chronic renal failure Progressive failure-- now stage 5 Scheduled for tunneled dialysis catheter placement Risks and benefits discussed with the patient including, but not limited to bleeding, infection, vascular injury, pneumothorax which  may require chest tube placement, air embolism or even death  All of the patient's questions were answered, patient is agreeable to proceed. Consent signed and in chart.   Thank you for this interesting consult.  I greatly enjoyed meeting Matthew Odom and look forward to participating in their care.  A copy of this report was sent to the requesting provider on this date.  Electronically Signed: Lavonia Drafts, PA-C 05/20/2021, 9:03 AM   I spent a total of  30 Minutes   in face to face in clinical consultation, greater than 50% of which was counseling/coordinating care for tunneled dialysis catheter placement

## 2021-05-21 ENCOUNTER — Encounter: Payer: Self-pay | Admitting: Family

## 2021-05-21 ENCOUNTER — Ambulatory Visit (INDEPENDENT_AMBULATORY_CARE_PROVIDER_SITE_OTHER): Payer: Self-pay | Admitting: Family

## 2021-05-21 ENCOUNTER — Other Ambulatory Visit: Payer: Self-pay

## 2021-05-21 VITALS — BP 133/78 | HR 90 | Temp 98.3°F | Resp 18 | Ht 70.98 in | Wt 269.0 lb

## 2021-05-21 DIAGNOSIS — Z794 Long term (current) use of insulin: Secondary | ICD-10-CM

## 2021-05-21 DIAGNOSIS — N1832 Chronic kidney disease, stage 3b: Secondary | ICD-10-CM

## 2021-05-21 DIAGNOSIS — E876 Hypokalemia: Secondary | ICD-10-CM

## 2021-05-21 DIAGNOSIS — I160 Hypertensive urgency: Secondary | ICD-10-CM

## 2021-05-21 DIAGNOSIS — I77810 Thoracic aortic ectasia: Secondary | ICD-10-CM

## 2021-05-21 DIAGNOSIS — E871 Hypo-osmolality and hyponatremia: Secondary | ICD-10-CM

## 2021-05-21 DIAGNOSIS — I5042 Chronic combined systolic (congestive) and diastolic (congestive) heart failure: Secondary | ICD-10-CM

## 2021-05-21 DIAGNOSIS — E1165 Type 2 diabetes mellitus with hyperglycemia: Secondary | ICD-10-CM

## 2021-05-21 DIAGNOSIS — F411 Generalized anxiety disorder: Secondary | ICD-10-CM

## 2021-05-21 DIAGNOSIS — J449 Chronic obstructive pulmonary disease, unspecified: Secondary | ICD-10-CM

## 2021-05-21 DIAGNOSIS — G4733 Obstructive sleep apnea (adult) (pediatric): Secondary | ICD-10-CM

## 2021-05-21 DIAGNOSIS — Z09 Encounter for follow-up examination after completed treatment for conditions other than malignant neoplasm: Secondary | ICD-10-CM

## 2021-05-21 MED ORDER — SERTRALINE HCL 25 MG PO TABS
25.0000 mg | ORAL_TABLET | Freq: Every day | ORAL | 0 refills | Status: DC
  Filled 2021-05-21: qty 30, 30d supply, fill #0

## 2021-05-21 MED ORDER — ALBUTEROL SULFATE (2.5 MG/3ML) 0.083% IN NEBU
2.5000 mg | INHALATION_SOLUTION | Freq: Four times a day (QID) | RESPIRATORY_TRACT | 2 refills | Status: DC | PRN
  Filled 2021-05-21: qty 150, 13d supply, fill #0

## 2021-05-21 NOTE — Progress Notes (Signed)
Pt presents for hospital follow-up, pt states that he needs something for anxiety

## 2021-05-22 ENCOUNTER — Other Ambulatory Visit: Payer: Self-pay

## 2021-05-22 NOTE — Progress Notes (Signed)
Please call patient with update.   Keep all scheduled appointments with dialysis.   Potassium improved since hospital discharge.   Continue prescribed calcium supplement. Refills available at [facility].

## 2021-05-22 NOTE — Progress Notes (Signed)
Keep all scheduled appointments with dialysis.   Potassium improved since hospital discharge.   Continue prescribed calcium supplement. Refills available at [facility].

## 2021-05-23 LAB — BASIC METABOLIC PANEL
BUN/Creatinine Ratio: 8 — ABNORMAL LOW (ref 9–20)
BUN: 61 mg/dL — ABNORMAL HIGH (ref 6–24)
CO2: 22 mmol/L (ref 20–29)
Calcium: 5.5 mg/dL — CL (ref 8.7–10.2)
Chloride: 99 mmol/L (ref 96–106)
Creatinine, Ser: 7.21 mg/dL — ABNORMAL HIGH (ref 0.76–1.27)
Glucose: 226 mg/dL — ABNORMAL HIGH (ref 70–99)
Potassium: 3.4 mmol/L — ABNORMAL LOW (ref 3.5–5.2)
Sodium: 142 mmol/L (ref 134–144)
eGFR: 9 mL/min/{1.73_m2} — ABNORMAL LOW (ref >59)

## 2021-05-27 ENCOUNTER — Other Ambulatory Visit: Payer: Self-pay

## 2021-05-28 ENCOUNTER — Other Ambulatory Visit: Payer: Self-pay

## 2021-05-30 NOTE — Progress Notes (Signed)
Erroneous encounter

## 2021-06-03 ENCOUNTER — Encounter: Payer: No Typology Code available for payment source | Admitting: Family

## 2021-06-06 ENCOUNTER — Other Ambulatory Visit: Payer: Self-pay

## 2021-06-10 ENCOUNTER — Emergency Department (HOSPITAL_BASED_OUTPATIENT_CLINIC_OR_DEPARTMENT_OTHER): Payer: Medicaid Other | Admitting: Radiology

## 2021-06-10 ENCOUNTER — Other Ambulatory Visit: Payer: Self-pay

## 2021-06-10 ENCOUNTER — Emergency Department (HOSPITAL_BASED_OUTPATIENT_CLINIC_OR_DEPARTMENT_OTHER)
Admission: EM | Admit: 2021-06-10 | Discharge: 2021-06-10 | Disposition: A | Discharge status: Home / Self Care | Payer: Medicaid Other | Attending: Emergency Medicine | Admitting: Emergency Medicine

## 2021-06-10 ENCOUNTER — Encounter (HOSPITAL_BASED_OUTPATIENT_CLINIC_OR_DEPARTMENT_OTHER): Payer: Self-pay | Admitting: Urology

## 2021-06-10 DIAGNOSIS — Z794 Long term (current) use of insulin: Secondary | ICD-10-CM | POA: Insufficient documentation

## 2021-06-10 DIAGNOSIS — M19072 Primary osteoarthritis, left ankle and foot: Secondary | ICD-10-CM | POA: Diagnosis not present

## 2021-06-10 DIAGNOSIS — M79605 Pain in left leg: Secondary | ICD-10-CM | POA: Insufficient documentation

## 2021-06-10 DIAGNOSIS — X509XXA Other and unspecified overexertion or strenuous movements or postures, initial encounter: Secondary | ICD-10-CM | POA: Insufficient documentation

## 2021-06-10 DIAGNOSIS — S86002A Unspecified injury of left Achilles tendon, initial encounter: Secondary | ICD-10-CM | POA: Diagnosis not present

## 2021-06-10 MED ORDER — OXYCODONE-ACETAMINOPHEN 5-325 MG PO TABS
2.0000 | ORAL_TABLET | Freq: Once | ORAL | Status: AC
  Administered 2021-06-10: 2 via ORAL
  Filled 2021-06-10: qty 2

## 2021-06-10 NOTE — ED Triage Notes (Signed)
States 15 min PTA, injury to left posterior heel States "felt pop" when pushing a truck out of the road Minimal weight bearing r/t pain

## 2021-06-10 NOTE — Discharge Instructions (Signed)
Please call Dr. Pollie Friar office this morning.  He would like to see you in recheck on Wednesday. Keep splint intact Keep the leg elevated and use cold therapy over splint Do not put weight on the leg and use crutches for any movement. Continue to use acetaminophen and ibuprofen as needed for pain.

## 2021-06-10 NOTE — ED Provider Notes (Signed)
[facility] EMERGENCY DEPT Provider Note   CSN: [id number] Arrival date & time: 06/10/21  1004     History  Chief Complaint  Patient presents with   Foot Injury    Matthew Odom is a 51 y.o. male.  HPI 51 year old male presents today complaining of popping sensation in left Achilles tendon.  He was pushing on a truck when this occurred just approximately 1 hour prior to admission.  He has pain in the left Achilles area.  Denies any other injury.     Home Medications Prior to Admission medications   Medication Sig Start Date End Date Taking? Authorizing Provider  albuterol (PROVENTIL) (2.5 MG/3ML) 0.083% nebulizer solution Take 3 mLs (2.5 mg total) by nebulization every 6 (six) hours as needed for wheezing or shortness of breath. 05/21/21   Camillia Herter, NP  amLODipine (NORVASC) 10 MG tablet Take 1 tablet (10 mg total) by mouth daily. 05/10/21 08/08/21  [place] Donnamarie Poag, DO  atorvastatin (LIPITOR) 80 MG tablet Take 1 tablet (80 mg total) by mouth daily. 05/10/21 08/08/21  [place] Donnamarie Poag, DO  budesonide-formoterol (SYMBICORT) 160-4.5 MCG/ACT inhaler Inhale 2 puffs into the lungs 2 (two) times daily. 05/10/21 08/08/21  [place] Eric J, DO  Calcium 500 MG tablet Take 1 tablet by mouth 3 times daily 05/14/21   Justin Mend, MD  calcium carbonate (OS-CAL - DOSED IN MG OF ELEMENTAL CALCIUM) 1250 (500 Ca) MG tablet Take 1 tablet (500 mg of elemental calcium total) by mouth 2 (two) times daily with a meal. 05/10/21 08/08/21  [place] Donnamarie Poag, DO  carvedilol (COREG) 25 MG tablet Take 1 tablet (25 mg total) by mouth 2 (two) times daily with a meal. 05/10/21 08/08/21  [place] Eric J, DO  ciclopirox (PENLAC) 8 % solution APPLY TOPICALLY AT BEDTIME. APPLY TO AFFECTED TOENAILS ONCE DAILY FOR 48 WEEKS. REMOVE ONCE WEEKLY WITH NAIL POLISH REMOVER. Patient taking differently: Apply 1 application topically at bedtime. Remove once weekly 07/27/20 07/27/21  Marzetta Board, DPM  doxazosin (CARDURA) 8  MG tablet Take 1 tablet (8 mg total) by mouth daily. 05/11/21 08/09/21  [place] Donnamarie Poag, DO  hydrALAZINE (APRESOLINE) 100 MG tablet Take 1 tablet (100 mg total) by mouth 3 (three) times daily. Take 1 tablet by mouth three times daily 05/10/21 08/08/21  [place] Donnamarie Poag, DO  insulin aspart (NOVOLOG) 100 UNIT/ML FlexPen Inject 2-10 Units into the skin 3 (three) times daily with meals. 2 units for glucose 151-200, 4 units for glucose 201-250, 6 units for glucose 251-300, 8 units for glucose 301-350, 10 units for glucose 351-400 05/10/21   [place] Eric J, DO  Insulin Glargine (BASAGLAR KWIKPEN) 100 UNIT/ML Inject 20 Units into the skin daily. 05/10/21 08/08/21  [place] Donnamarie Poag, DO  Insulin Pen Needle 31G X 6 MM MISC Use as directed 2 (two) times daily. 05/10/21   [place] Eric J, DO  liraglutide (VICTOZA) 18 MG/3ML SOPN Inject 1.8 mg into the skin daily. 05/10/21 08/08/21  [place] J, DO  Misc. Devices MISC Please provide patient with insurance approved CPAP with the following settings:  autopap 15-20.  Large size [employer] Full Face Mask Forma mask and heated humidification. Patient taking differently: Please provide patient with insurance approved CPAP with the following settings:  autopap 15-20.  Large size [employer] Full Face Mask Forma mask and heated humidification. 12/25/19   [name], NP  nitroGLYCERIN (NITROSTAT) 0.4 MG SL tablet Place 1 tablet (0.4 mg total) under the tongue every  5 (five) minutes as needed for chest pain. 05/10/21   [place] Donnamarie Poag, DO  sertraline (ZOLOFT) 25 MG tablet Take 1 tablet (25 mg total) by mouth daily. 05/21/21 06/21/21  Camillia Herter, NP  tamsulosin (FLOMAX) 0.4 MG CAPS capsule Take 1 capsule (0.4 mg total) by mouth at bedtime. 05/10/21 08/08/21  [place] Eric J, DO      Allergies    Aspirin and Nitroglycerin    Review of Systems   Review of Systems  All other systems reviewed and are negative.  Physical Exam Updated Vital Signs BP (!) 163/115 (BP Location: Right  Arm)    Pulse 87    Temp 98.1 F (36.7 C)    Resp 18    Ht 1.803 m (5\' 11" )    Wt 122.1 kg    SpO2 95%    BMI 37.54 kg/m  Physical Exam Vitals and nursing note reviewed.  Constitutional:      Appearance: Normal appearance. He is well-developed.  HENT:     Head: Normocephalic and atraumatic.     Right Ear: Tympanic membrane and external ear normal.     Left Ear: Tympanic membrane and external ear normal.     Nose: Nose normal.  Eyes:     Extraocular Movements: Extraocular movements intact.  Neck:     Trachea: No tracheal deviation.  Pulmonary:     Effort: Pulmonary effort is normal.  Musculoskeletal:        General: Normal range of motion.     Comments: Left Achilles tendon with some bogginess and tenderness to palpation but does appear intact with more bogginess over the medial aspect There is no injury to the skin Bony fractures appear to be aligned Pulses intact Sensation intact Patient is able to plantar and dorsiflex at the left ankle although somewhat decreased from the opposite side.  Skin:    General: Skin is warm and dry.  Neurological:     Mental Status: He is alert and oriented to person, place, and time.  Psychiatric:        Mood and Affect: Mood normal.        Behavior: Behavior normal.    ED Results / Procedures / Treatments   Labs (all labs ordered are listed, but only abnormal results are displayed) Labs Reviewed - No data to display  EKG None  Radiology DG Foot Complete Left  Result Date: 06/10/2021 CLINICAL DATA:  Popping sensation in left heel, initial encounter. EXAM: LEFT FOOT - COMPLETE 3+ VIEW COMPARISON:  None. FINDINGS: No acute osseous or joint abnormality. Mild degenerative changes in the first metatarsophalangeal joint. IMPRESSION: No acute osseous or joint abnormality. Electronically Signed   By: Lorin Picket M.D.   On: 06/10/2021 10:50    Procedures Procedures    Medications Ordered in ED Medications  oxyCODONE-acetaminophen  (PERCOCET/ROXICET) 5-325 MG per tablet 2 tablet (has no administration in time range)    ED Course/ Medical Decision Making/ A&P Clinical Course as of 06/10/21 1231  Mon Jun 10, 2021  1229 Left ankle x-Miko Sirico reviewed and interpreted with no evidence of acute fracture Radiology results reviewed [DR]    Clinical Course User Index [DR] Pattricia Boss, MD                           Medical Decision Making 51 year old male presents today with acute pain in his left Achilles after pushing on a truck.  X-rays are without acute bony abnormality.  Suspect partial Achilles tendon tear based on exam.  However, he is neurovascularly intact and does not appear to be through and through as he is able to have some plantarflexion at the left ankle. Care discussed with Dr. Lucia Gaskins. Plan splint in plantarflexion at left ankle Patient advised to call the office to be seen for recheck in clinic on Wednesday. We discussed conservative therapy including cold therapy, nonweightbearing, and elevation.  Amount and/or Complexity of Data Reviewed Radiology: ordered. Decision-making details documented in ED Course.  Risk Prescription drug management.           Final Clinical Impression(s) / ED Diagnoses Final diagnoses:  None    Rx / DC Orders ED Discharge Orders     None         Pattricia Boss, MD 06/10/21 1234

## 2021-06-12 DIAGNOSIS — M25572 Pain in left ankle and joints of left foot: Secondary | ICD-10-CM | POA: Diagnosis not present

## 2021-06-12 DIAGNOSIS — S86012A Strain of left Achilles tendon, initial encounter: Secondary | ICD-10-CM | POA: Diagnosis not present

## 2021-06-13 ENCOUNTER — Other Ambulatory Visit: Payer: Self-pay

## 2021-06-13 ENCOUNTER — Other Ambulatory Visit: Payer: Self-pay | Admitting: Orthopaedic Surgery

## 2021-06-13 MED ORDER — CALCIUM ACETATE (PHOS BINDER) 667 MG PO CAPS
ORAL_CAPSULE | ORAL | 3 refills | Status: DC
  Filled 2021-06-13: qty 90, 30d supply, fill #0

## 2021-06-13 NOTE — Progress Notes (Signed)
Patient ID: Matthew Odom, male    DOB: 1971/04/17  MRN: [id number]  CC: Paperwork  Subjective: Matthew Odom is a 51 y.o. male who presents for paperwork. He is accompanied by his spouse.   His concerns today include:  Requesting home health services paperwork be completed today. Needs assistance during daytime for at least 6 hours while his spouse is at work. Specifically would need assistance with hygiene, catheter care, feeding/cooking, medications, and cleaning etc. They have selected a family member/friend that is able to assist them instead of going through an agency.  Also, has pre-op clearance paperwork from [facility] for left achilles tendon repair on 06/20/21. Requesting updated hemoglobin A1c.   Patient Active Problem List   Diagnosis Date Noted   AKI (acute kidney injury) ([place]) 05/07/2021   Hyperglycemia due to diabetes mellitus ([place]) 05/07/2021   Acute-on-chronic kidney injury ([place]) 05/07/2021   Hypokalemia 05/07/2021   Hypocalcemia 05/07/2021   Hyperglycemia 11/08/2020   Orthostatic hypotension 11/28/2019   Type 2 diabetes mellitus, with long-term current use of insulin ([place]) 09/27/2018   Chest pain 06/29/2018   Degenerative spondylolisthesis 04/04/2018   Lumbar radiculopathy 04/04/2018   Spinal stenosis of lumbar region with neurogenic claudication 04/04/2018   Synovial cyst of lumbar facet joint 04/04/2018   CKD (chronic kidney disease), stage III ([place]) 01/03/2018   Essential hypertension 01/02/2018   Hyperlipemia 01/02/2018   Abnormal EKG 01/02/2018   COPD with acute bronchitis ([place]) 01/02/2018   Nicotine abuse 01/02/2018   Chronic combined systolic and diastolic CHF, NYHA class 3 ([place]) 02/15/2016   LVH (left ventricular hypertrophy) due to hypertensive disease 02/15/2016   Nonischemic cardiomyopathy ([place]) 03/23/2015     Current Outpatient Medications on File Prior to Visit  Medication Sig Dispense Refill   albuterol  (PROVENTIL) (2.5 MG/3ML) 0.083% nebulizer solution Take 3 mLs (2.5 mg total) by nebulization every 6 (six) hours as needed for wheezing or shortness of breath. 150 mL 2   albuterol (VENTOLIN HFA) 108 (90 Base) MCG/ACT inhaler Inhale 2 puffs into the lungs every 6 (six) hours as needed. 18 g 5   amLODipine (NORVASC) 10 MG tablet Take 1 tablet (10 mg total) by mouth daily. 30 tablet 2   atorvastatin (LIPITOR) 80 MG tablet Take 1 tablet (80 mg total) by mouth daily. 30 tablet 2   Calcium 500 MG tablet Take 1 tablet by mouth 3 times daily 90 tablet 2   calcium acetate (PHOSLO) 667 MG capsule Take 1 tablet by mouth three times a day with meals 90 capsule 3   calcium carbonate (OS-CAL - DOSED IN MG OF ELEMENTAL CALCIUM) 1250 (500 Ca) MG tablet Take 1 tablet (500 mg of elemental calcium total) by mouth 2 (two) times daily with a meal. 60 tablet 2   carvedilol (COREG) 25 MG tablet Take 1 tablet (25 mg total) by mouth 2 (two) times daily with a meal. 60 tablet 2   ciclopirox (PENLAC) 8 % solution APPLY TOPICALLY AT BEDTIME. APPLY TO AFFECTED TOENAILS ONCE DAILY FOR 48 WEEKS. REMOVE ONCE WEEKLY WITH NAIL POLISH REMOVER. (Patient taking differently: Apply 1 application topically at bedtime. Remove once weekly) 6.6 mL 11   doxazosin (CARDURA) 8 MG tablet Take 1 tablet (8 mg total) by mouth daily. 30 tablet 2   hydrALAZINE (APRESOLINE) 100 MG tablet Take 1 tablet (100 mg total) by mouth 3 (three) times daily. Take 1 tablet by mouth three times daily 90 tablet 2   insulin  aspart (NOVOLOG) 100 UNIT/ML FlexPen Inject 2-10 Units into the skin 3 (three) times daily with meals. 2 units for glucose 151-200, 4 units for glucose 201-250, 6 units for glucose 251-300, 8 units for glucose 301-350, 10 units for glucose 351-400 15 mL 11   Insulin Glargine (BASAGLAR KWIKPEN) 100 UNIT/ML Inject 20 Units into the skin daily. 6 mL 2   Insulin Pen Needle 31G X 6 MM MISC Use as directed 2 (two) times daily. 100 each 2   liraglutide  (VICTOZA) 18 MG/3ML SOPN Inject 1.8 mg into the skin daily. 9 mL 2   Misc. Devices MISC Please provide patient with insurance approved CPAP with the following settings:  autopap 15-20.  Large size [employer] Full Face Mask Forma mask and heated humidification. (Patient taking differently: Please provide patient with insurance approved CPAP with the following settings:  autopap 15-20.  Large size [employer] Full Face Mask Forma mask and heated humidification.) 1 each 0   nitroGLYCERIN (NITROSTAT) 0.4 MG SL tablet Place 1 tablet (0.4 mg total) under the tongue every 5 (five) minutes as needed for chest pain. 25 tablet 0   sertraline (ZOLOFT) 25 MG tablet Take 1 tablet (25 mg total) by mouth daily. 30 tablet 0   tamsulosin (FLOMAX) 0.4 MG CAPS capsule Take 1 capsule (0.4 mg total) by mouth at bedtime. 30 capsule 2   Tiotropium Bromide-Olodaterol (STIOLTO RESPIMAT) 2.5-2.5 MCG/ACT AERS Inhale 2 puffs into the lungs daily. 4 g 5   No current facility-administered medications on file prior to visit.    Allergies  Allergen Reactions   Aspirin Hives   Nitroglycerin Other (See Comments)    Nitroglycerin paste will make blood pressure go up and cause headaches. The nitroglycerin tablets do fine.    Social History   Socioeconomic History   Marital status: Married    Spouse name: Not on file   Number of children: Not on file   Years of education: Not on file   Highest education level: Not on file  Occupational History   Not on file  Tobacco Use   Smoking status: Former    Packs/day: 1.00    Years: 40.00    Pack years: 40.00    Types: Cigarettes   Smokeless tobacco: Never   Tobacco comments:    Stopped smoking 05/21/21.   Substance and Sexual Activity   Alcohol use: Not Currently    Comment: occasionally   Drug use: Yes    Types: Cocaine   Sexual activity: Yes  Other Topics Concern   Not on file  Social History Narrative   Not on file   Social Determinants of Health    Financial Resource Strain: Not on file  Food Insecurity: Not on file  Transportation Needs: Not on file  Physical Activity: Not on file  Stress: Not on file  Social Connections: Not on file  Intimate Partner Violence: Not on file    Family History  Problem Relation Age of Onset   Heart attack Mother    Hypertension Mother    Diabetes Mother    Heart disease Sister    Hypertension Sister    Hypertension Father    Emphysema Father     Past Surgical History:  Procedure Laterality Date   CARDIAC CATHETERIZATION     IR FLUORO GUIDE CV LINE RIGHT  05/20/2021   IR US GUIDE VASC ACCESS RIGHT  05/20/2021   NO PAST SURGERIES      ROS: Review of Systems Negative except as stated  above  PHYSICAL EXAM: BP 114/75 (BP Location: Left Arm, Patient Position: Sitting, Cuff Size: Large)    Pulse 68    Temp 98.3 F (36.8 C)    Resp 18    SpO2 98%   Physical Exam HENT:     Head: Normocephalic and atraumatic.  Eyes:     Extraocular Movements: Extraocular movements intact.     Conjunctiva/sclera: Conjunctivae normal.     Pupils: Pupils are equal, round, and reactive to light.  Cardiovascular:     Rate and Rhythm: Normal rate and regular rhythm.     Pulses: Normal pulses.     Heart sounds: Normal heart sounds.  Pulmonary:     Effort: Pulmonary effort is normal.     Breath sounds: Normal breath sounds.  Musculoskeletal:     Cervical back: Normal range of motion and neck supple.  Neurological:     General: No focal deficit present.     Mental Status: He is alert and oriented to person, place, and time.  Psychiatric:        Mood and Affect: Mood normal.        Behavior: Behavior normal.    Results for orders placed or performed in visit on 06/14/21  POCT glycosylated hemoglobin (Hb A1C)  Result Value Ref Range   Hemoglobin A1C 10.5 (A) 4.0 - 5.6 %   HbA1c POC (<> result, manual entry)     HbA1c, POC (prediabetic range)     HbA1c, POC (controlled diabetic range)        ASSESSMENT AND PLAN: 1. Encounter for completion of form with patient: - [id number] home health services paperwork completed today in office.  - Will ask Eden Lathe, RN case manger to advise if patient is able to select family member/friend for home health services or if he will be required to go through an agency.   2. Type 2 diabetes mellitus with hyperglycemia, with long-term current use of insulin ([place]): - Hemoglobin A1c today 10.5%. Goal for pre-op clearance is 7.7%.  - Continue with referral to Endocrinology for management.  - POCT glycosylated hemoglobin (Hb A1C)   Patient was given the opportunity to ask questions.  Patient verbalized understanding of the plan and was able to repeat key elements of the plan. Patient was given clear instructions to go to Emergency Department or return to medical center if symptoms don't improve, worsen, or new problems develop.The patient verbalized understanding.   Orders Placed This Encounter  Procedures   POCT glycosylated hemoglobin (Hb A1C)    Follow-up with primary provider as scheduled.  Camillia Herter, NP

## 2021-06-14 ENCOUNTER — Other Ambulatory Visit: Payer: Self-pay

## 2021-06-14 ENCOUNTER — Ambulatory Visit (INDEPENDENT_AMBULATORY_CARE_PROVIDER_SITE_OTHER): Payer: No Typology Code available for payment source | Admitting: Internal Medicine

## 2021-06-14 ENCOUNTER — Encounter: Payer: Self-pay | Admitting: Internal Medicine

## 2021-06-14 ENCOUNTER — Encounter: Payer: Self-pay | Admitting: Family

## 2021-06-14 ENCOUNTER — Ambulatory Visit (INDEPENDENT_AMBULATORY_CARE_PROVIDER_SITE_OTHER): Payer: Medicaid Other | Admitting: Family

## 2021-06-14 VITALS — BP 114/75 | HR 68 | Temp 98.3°F | Resp 18

## 2021-06-14 VITALS — BP 126/82 | HR 70 | Temp 98.1°F | Ht 71.0 in | Wt 223.0 lb

## 2021-06-14 DIAGNOSIS — G4733 Obstructive sleep apnea (adult) (pediatric): Secondary | ICD-10-CM

## 2021-06-14 DIAGNOSIS — J441 Chronic obstructive pulmonary disease with (acute) exacerbation: Secondary | ICD-10-CM

## 2021-06-14 DIAGNOSIS — J449 Chronic obstructive pulmonary disease, unspecified: Secondary | ICD-10-CM

## 2021-06-14 DIAGNOSIS — Z794 Long term (current) use of insulin: Secondary | ICD-10-CM | POA: Diagnosis not present

## 2021-06-14 DIAGNOSIS — Z0289 Encounter for other administrative examinations: Secondary | ICD-10-CM | POA: Diagnosis not present

## 2021-06-14 DIAGNOSIS — E1165 Type 2 diabetes mellitus with hyperglycemia: Secondary | ICD-10-CM

## 2021-06-14 DIAGNOSIS — Z9989 Dependence on other enabling machines and devices: Secondary | ICD-10-CM

## 2021-06-14 DIAGNOSIS — N186 End stage renal disease: Secondary | ICD-10-CM

## 2021-06-14 DIAGNOSIS — J4489 Other specified chronic obstructive pulmonary disease: Secondary | ICD-10-CM

## 2021-06-14 LAB — POCT GLYCOSYLATED HEMOGLOBIN (HGB A1C): Hemoglobin A1C: 10.5 % — AB (ref 4.0–5.6)

## 2021-06-14 MED ORDER — STIOLTO RESPIMAT 2.5-2.5 MCG/ACT IN AERS
1.0000 | INHALATION_SPRAY | Freq: Every day | RESPIRATORY_TRACT | 5 refills | Status: DC
  Filled 2021-06-14: qty 4, 30d supply, fill #0

## 2021-06-14 MED ORDER — STIOLTO RESPIMAT 2.5-2.5 MCG/ACT IN AERS
2.0000 | INHALATION_SPRAY | Freq: Every day | RESPIRATORY_TRACT | 5 refills | Status: DC
  Filled 2021-06-14: qty 4, 30d supply, fill #0
  Filled 2021-07-23: qty 4, 28d supply, fill #0
  Filled 2021-07-23: qty 4, 25d supply, fill #0
  Filled 2021-07-23: qty 4, 28d supply, fill #0

## 2021-06-14 MED ORDER — ALBUTEROL SULFATE (2.5 MG/3ML) 0.083% IN NEBU
2.5000 mg | INHALATION_SOLUTION | Freq: Four times a day (QID) | RESPIRATORY_TRACT | 2 refills | Status: DC | PRN
  Filled 2021-06-14 – 2022-05-12 (×2): qty 150, 13d supply, fill #0

## 2021-06-14 MED ORDER — ALBUTEROL SULFATE HFA 108 (90 BASE) MCG/ACT IN AERS
2.0000 | INHALATION_SPRAY | Freq: Four times a day (QID) | RESPIRATORY_TRACT | 5 refills | Status: DC | PRN
  Filled 2021-06-14: qty 18, 25d supply, fill #0

## 2021-06-14 NOTE — Progress Notes (Signed)
Matthew Odom    [id number]    July 07, 1970  Primary Care Physician:Stephens, Flonnie Hailstone, NP  Referring Physician: Camillia Herter, NP [address] [place],  [place] [address] Reason for Consultation: shortness of breath Date of Consultation: 06/14/2021  Chief complaint:   Chief Complaint  Patient presents with   Consult    SOB, OSA using C-Pap     HPI:  Matthew Odom is a 51 y.o. man with mutiple medical issues including Type 2 DM, HTN, ESRD(since Jan 2023), OSA on CPAP, Heart failure and COPD. Recent admissions for poorly controlled diabetes.   Has been having shortness of breath worsening over the last 2-3 months Is on symbicort but hasn't been on this for the last 2-3 months they stopped sending it in the mail. He has been on those for at least the past year.   Currently in a boot in his left leg due to torn achilles tendon after trying to push a truck out of the road   Recently started on CPAP (last month) due to not having insurance. He thinks mask is not working. They picked up cpap from [facility] and did a mask fitting there.   He is falling asleep during our interview today.   Denies recurrent exacerbations or pneumonias requiring steroids and antibiotics.   Recently approved for medicaid.   Social history:  Occupation: he is applying for disability, has done Architect work in the past.  Exposures: at home with wife and kids, dog at home.  Smoking history: age 1, quit a month ago. 40 pack years   Social History   Occupational History   Not on file  Tobacco Use   Smoking status: Former    Packs/day: 1.00    Years: 40.00    Pack years: 40.00    Types: Cigarettes   Smokeless tobacco: Never   Tobacco comments:    Stopped smoking 05/21/21.   Substance and Sexual Activity   Alcohol use: Not Currently    Comment: occasionally   Drug use: Yes    Types: Cocaine   Sexual activity: Yes    Relevant family history:  Family  History  Problem Relation Age of Onset   Heart attack Mother    Hypertension Mother    Diabetes Mother    Heart disease Sister    Hypertension Sister    Hypertension Father    Emphysema Father     Past Medical History:  Diagnosis Date   Cocaine use    Diabetes mellitus without complication ([place])    Enlarged heart    ESRD (end stage renal disease) ([place])    History of degenerative disc disease    Hyperlipidemia    Hypertension    NSTEMI (non-ST elevated myocardial infarction) ([place])    Sciatic nerve pain     Past Surgical History:  Procedure Laterality Date   CARDIAC CATHETERIZATION     IR FLUORO GUIDE CV LINE RIGHT  05/20/2021   IR US GUIDE VASC ACCESS RIGHT  05/20/2021   NO PAST SURGERIES       Physical Exam: Blood pressure 126/82, pulse 70, temperature 98.1 F (36.7 C), temperature source Oral, height 5\' 11"  (1.803 m), weight 223 lb (101.2 kg). Gen:      No acute distress, fatigued, falling asleep ENT:  mallampati IV, no nasal polyps, mucus membranes moist Lungs:    Diminished, No increased respiratory effort, symmetric chest wall excursion, clear  to auscultation bilaterally, no wheezes or crackles CV:        Diminished  Regular rate and rhythm; no murmurs, rubs, or gallops.  No pedal edema Abd:      + bowel sounds; soft, non-tender; no distension MSK: no acute synovitis of DIP or PIP joints, no mechanics hands.  Skin:      Warm and dry; no rashes Neuro: normal speech, no focal facial asymmetry Psych: alert and oriented x3, normal mood and affect   Data Reviewed/Medical Decision Making:  Independent interpretation of tests: Imaging:  Review of patient's chest xray July 2021 images revealed cardiomegaly, no acute process. The patient's images have been independently reviewed by me.    PFTs:  No flowsheet data found.  Labs:  Lab Results  Component Value Date   WBC 7.8 05/07/2021   HGB 14.1 05/07/2021   HCT 42.2 05/07/2021   MCV 79.0 (L) 05/07/2021   PLT 304  05/07/2021   Lab Results  Component Value Date   NA 142 05/21/2021   K 3.4 (L) 05/21/2021   CL 99 05/21/2021   CO2 22 05/21/2021      Immunization status:  Immunization History  Administered Date(s) Administered   PFIZER(Purple Top)SARS-COV-2 Vaccination 12/24/2019, 01/14/2020     I reviewed prior external note(s) from PCP, hospital stays, ED visit  I reviewed the result(s) of the labs and imaging as noted above.   I have ordered pft   Assessment:  COPD not well controlled Recent tobacco cessation OSA on CPAP - not well controlled ESRD, recent CAD with CHF   Plan/Recommendations:  Will resume inhaler therapy with [facility]. Stiolto 2 puffs once a day sent to pharmacy Resume albuterol inhaler prn  We need to get his CPAP download and get a mask of best fit. Will reach out to [facility] - he may need a new DME company now that he has insurance. It looks like his most recent machine was given by [facility].   Will obtain a full set of PFTs.   Continue to abstain from smoking.   We discussed disease management and progression at length today.   Return to Care: Return in about 3 months (around 09/11/2021).  Lenice Llamas, MD [facility] [place]  CC: Camillia Herter, NP

## 2021-06-14 NOTE — Patient Instructions (Addendum)
Please schedule follow up scheduled with myself in 3 months.  If my schedule is not open yet, we will contact you with a reminder closer to that time. Please call [phone] if you haven't heard from [place] a month before.   Before your next visit I would like you to have: Full set of PFTs - 1 hour  Start taking stiolto respimat inhaler 2 puffs once a day.   Start wearing your CPAP - we will try to find out how to get you set up with a DME company for supplies and new mask for your CPAP

## 2021-06-14 NOTE — Progress Notes (Signed)
Pt presents for completion of [facility] forms and surg ical clearance

## 2021-06-14 NOTE — Progress Notes (Signed)
Discussed in office

## 2021-06-15 ENCOUNTER — Telehealth: Payer: Self-pay | Admitting: Family

## 2021-06-17 ENCOUNTER — Other Ambulatory Visit: Payer: Self-pay

## 2021-06-18 ENCOUNTER — Encounter: Payer: Self-pay | Admitting: Internal Medicine

## 2021-06-18 NOTE — Telephone Encounter (Signed)
Thank you for the information, it was helpful. I will have the CMA confirm with the patient and his wife about possibly going with CAP services. Do you have contact information of the same so that we can share with patient? Thank you.

## 2021-06-18 NOTE — Progress Notes (Signed)
Name: Matthew Odom  MRN/ DOB: [id number], 09/09/1970   Age/ Sex: 51 y.o., male    PCP: Camillia Herter, NP   Reason for Endocrinology Evaluation: Type 2 Diabetes Mellitus     Date of Initial Endocrinology Visit: 06/19/2021     PATIENT IDENTIFIER: Matthew Odom is a 51 y.o. male with a past medical history of T2Dm, HTN, CHF, COPD . The patient presented for initial endocrinology clinic visit on 06/19/2021 for consultative assistance with his diabetes management.    HPI: Matthew Odom is accompanied by his spouse today   Diagnosed with DM > 20 yrs  Prior Medications tried/Intolerance: metformin,  Currently checking blood sugars occasionally   Hypoglycemia episodes : no                Hemoglobin A1c has ranged from 7.0% in 2021, peaking at 13.5% in 05/2021. Patient required assistance for hypoglycemia: yes - a while ago  Patient has required hospitalization within the last 1 year from hyper or hypoglycemia: 05/2021 for AKI and hyperglycemia   In terms of diet, the patient eats 2- 3 meals  a day. Drinks sugar-sweetened beverages.     Has a left foot boot due to achilles tendon injury   He is tired  Denies nausea, vomiting or diarrhea  No recent ETOH intake    HOME DIABETES REGIMEN: Basaglar 20 units daily  Novolog SS Victoza 1.8 mg daily    Statin: yes ACE-I/ARB: No    METER DOWNLOAD SUMMARY: unable to download  30 day average 163 mg/dL    84 - 509   DIABETIC COMPLICATIONS: Microvascular complications:  ESRD on HD ( Tuesday, Thursday and Saturday )  Denies:  Last eye exam: Completed a while ago   Macrovascular complications:  CHF  Denies: CAD, PVD, CVA   PAST HISTORY: Past Medical History:  Past Medical History:  Diagnosis Date   Cocaine use    Diabetes mellitus without complication ([place])    Enlarged heart    ESRD (end stage renal disease) ([place])    History of degenerative disc disease    Hyperlipidemia    Hypertension    NSTEMI (non-ST elevated  myocardial infarction) ([place])    Sciatic nerve pain    Past Surgical History:  Past Surgical History:  Procedure Laterality Date   CARDIAC CATHETERIZATION     IR FLUORO GUIDE CV LINE RIGHT  05/20/2021   IR US GUIDE VASC ACCESS RIGHT  05/20/2021   NO PAST SURGERIES      Social History:  reports that he has quit smoking. His smoking use included cigarettes. He has a 40.00 pack-year smoking history. He has never used smokeless tobacco. He reports that he does not currently use alcohol. He reports current drug use. Drug: Cocaine. Family History:  Family History  Problem Relation Age of Onset   Heart attack Mother    Hypertension Mother    Diabetes Mother    Heart disease Sister    Hypertension Sister    Hypertension Father    Emphysema Father      HOME MEDICATIONS: Allergies as of 06/19/2021       Reactions   Aspirin Hives   Nitroglycerin Other (See Comments)   Nitroglycerin paste will make blood pressure go up and cause headaches. The nitroglycerin tablets do fine.        Medication List        Accurate as of June 19, 2021 12:39 PM. If you have any questions, ask your  nurse or doctor.          albuterol (2.5 MG/3ML) 0.083% nebulizer solution Commonly known as: PROVENTIL Take 3 mLs (2.5 mg total) by nebulization every 6 (six) hours as needed for wheezing or shortness of breath.   albuterol 108 (90 Base) MCG/ACT inhaler Commonly known as: VENTOLIN HFA Inhale 2 puffs into the lungs every 6 (six) hours as needed.   amLODipine 10 MG tablet Commonly known as: NORVASC Take 1 tablet (10 mg total) by mouth daily.   atorvastatin 80 MG tablet Commonly known as: LIPITOR Take 1 tablet (80 mg total) by mouth daily.   Basaglar KwikPen 100 UNIT/ML Inject 20 Units into the skin daily.   Calcium 500 MG tablet Take 1 tablet by mouth 3 times daily   calcium acetate 667 MG capsule Commonly known as: PHOSLO Take 1 tablet by mouth three times a day with meals   calcium  carbonate 1250 (500 Ca) MG tablet Commonly known as: OS-CAL - dosed in mg of elemental calcium Take 1 tablet (500 mg of elemental calcium total) by mouth 2 (two) times daily with a meal.   carvedilol 25 MG tablet Commonly known as: COREG Take 1 tablet (25 mg total) by mouth 2 (two) times daily with a meal.   ciclopirox 8 % solution Commonly known as: PENLAC APPLY TOPICALLY AT BEDTIME. APPLY TO AFFECTED TOENAILS ONCE DAILY FOR 48 WEEKS. REMOVE ONCE WEEKLY WITH NAIL POLISH REMOVER. What changed:  how much to take how to take this when to take this additional instructions   doxazosin 8 MG tablet Commonly known as: CARDURA Take 1 tablet (8 mg total) by mouth daily.   hydrALAZINE 100 MG tablet Commonly known as: APRESOLINE Take 1 tablet (100 mg total) by mouth 3 (three) times daily. Take 1 tablet by mouth three times daily   insulin aspart 100 UNIT/ML FlexPen Commonly known as: NOVOLOG Inject a maximun of 40 units units daily. What changed:  how much to take how to take this when to take this additional instructions Changed by: Dorita Sciara, MD   Insulin Pen Needle 31G X 6 MM Misc Use as directed in the morning, at noon, in the evening, and at bedtime. What changed: when to take this Changed by: Dorita Sciara, MD   Misc. Devices Misc Please provide patient with insurance approved CPAP with the following settings:  autopap 15-20.  Large size Fisher[employer] Full Face Mask Forma mask and heated humidification.   nitroGLYCERIN 0.4 MG SL tablet Commonly known as: NITROSTAT Place 1 tablet (0.4 mg total) under the tongue every 5 (five) minutes as needed for chest pain.   oxyCODONE 5 MG immediate release tablet Commonly known as: Oxy IR/ROXICODONE Take 5 mg by mouth every 4 (four) hours as needed.   sertraline 25 MG tablet Commonly known as: ZOLOFT Take 1 tablet (25 mg total) by mouth daily.   Stiolto Respimat 2.5-2.5 MCG/ACT Aers Generic drug: Tiotropium  Bromide-Olodaterol Inhale 2 puffs into the lungs daily.   tamsulosin 0.4 MG Caps capsule Commonly known as: FLOMAX Take 1 capsule (0.4 mg total) by mouth at bedtime.   Victoza 18 MG/3ML Sopn Generic drug: liraglutide Inject 1.8 mg into the skin daily.         ALLERGIES: Allergies  Allergen Reactions   Aspirin Hives   Nitroglycerin Other (See Comments)    Nitroglycerin paste will make blood pressure go up and cause headaches. The nitroglycerin tablets do fine.     REVIEW OF SYSTEMS:  A comprehensive ROS was conducted with the patient and is negative except as per HPI     OBJECTIVE:   VITAL SIGNS: BP 124/80 (BP Location: Left Arm, Patient Position: Sitting, Cuff Size: Large)    Pulse 71    Ht 5\' 11"  (1.803 m)    Wt 223 lb (101.2 kg)    SpO2 95%    BMI 31.10 kg/m    PHYSICAL EXAM:  General: Pt appears drowsy  Neck: General: Supple without adenopathy or carotid bruits. Thyroid: Thyroid size normal.  No goiter or nodules appreciated.   Lungs: Clear with good BS bilat with no rales, rhonchi, or wheezes  Heart: RRR   Extremities:  Lower extremities - No pretibial edema on the right, left lower extremity boot in place.  Neuro: Patient is drowsy and has been dozing off during the visit     DATA REVIEWED:  Lab Results  Component Value Date   HGBA1C 10.5 (A) 06/14/2021   HGBA1C 13.5 (H) 05/07/2021   HGBA1C 10.0 (A) 01/29/2021   Lab Results  Component Value Date   LDLCALC 126 (H) 01/29/2021   CREATININE 7.21 (H) 05/21/2021   Lab Results  Component Value Date   MICRALBCREAT 5,247 (H) 10/07/2019    Lab Results  Component Value Date   CHOL 220 (H) 01/29/2021   HDL 35 (L) 01/29/2021   LDLCALC 126 (H) 01/29/2021   TRIG 331 (H) 01/29/2021   CHOLHDL 6.3 (H) 01/29/2021        Latest Reference Range & Units Most Recent  ALDOSTERONE 0.0 - 30.0 ng/dL 9.5 01/03/18 09:10  PRA LC/MS/MS 0.167 - 5.380 ng/mL/hr 1.930 01/03/18 09:10  ALDO / PRA Ratio 0.0 - 30.0  4.9 01/03/18  09:10   ASSESSMENT / PLAN / RECOMMENDATIONS:   1) Type 2 Diabetes Mellitus, Poorly controlled, With ESRD on HD  complications - Most recent A1c of 10.5 %. Goal A1c < 7.0 %.     -We did discuss briefly microvascular complications of poorly controlled diabetes to include blindness, neuropathy, increased risk of amputations, unfortunately he already has end-stage renal disease but I did encourage him to continue to work on optimizing his glucose control. -During his visit today he was very drowsy, he was actually dozing off during his visit, most of the history was obtained from the wife.  It is unclear to me the cause of his drowsiness of note he did sleep from 10 PM until 8 AM this morning, he denies any alcohol use, of note the patient is on oxycodone.  I am not sure if this has anything to do with it -Did express my concern to the wife that with his drowsiness I am afraid that he is missing insulin doses which she does agree that he had missed insulin doses  -Perceives hypoglycemia at 100 mg/dL  , we discussed this is because of chronic hyperglycemia -I am going to adjust his insulin as below, he will also be provided with a correction factor  MEDICATIONS: Continue Victoza 1.8 mg daily Continue Basaglar 20 units daily Start NovoLog 4 units 3 times a day with meals Correction factor: NovoLog (BG -130/50)  EDUCATION / INSTRUCTIONS: BG monitoring instructions: Patient is instructed to check his blood sugars 3 times a day, before meals. Call [facility] if: BG persistently < 70 I reviewed the Rule of 15 for the treatment of hypoglycemia in detail with the patient. Literature supplied.   2) Diabetic complications:  Eye: Unknown to have known diabetic retinopathy.  Neuro/ Feet: Unknown to have known diabetic peripheral neuropathy. Renal: Patient does  have known baseline CKD. He is not on an ACEI/ARB at present.patient on hemodialysis     Follow-up in 3  months   Signed electronically by: Mack Guise, MD  [facility]  [address] [place], [place] [address] Phone: [phone] FAX: [phone]   CC: Camillia Herter, NP [place] [address] Phone: [phone]  Fax: [phone]    Return to Endocrinology clinic as below: Future Appointments  Date Time Provider [place]  07/08/2021  1:00 PM [facility] VASC 2 - [facility]  07/08/2021  1:30 PM [facility] VASC 2 - [facility]  07/08/2021  2:00 PM Serafina Mitchell, MD [facility]  09/04/2021 10:00 AM [facility] PFT ROOM [facility] None  09/04/2021 11:00 AM Spero Geralds, MD [facility] None  09/16/2021 10:30 AM Levander Katzenstein, Melanie Crazier, MD [facility] None

## 2021-06-19 ENCOUNTER — Ambulatory Visit (INDEPENDENT_AMBULATORY_CARE_PROVIDER_SITE_OTHER): Payer: No Typology Code available for payment source | Admitting: Internal Medicine

## 2021-06-19 ENCOUNTER — Other Ambulatory Visit: Payer: Self-pay

## 2021-06-19 ENCOUNTER — Encounter: Payer: Self-pay | Admitting: Internal Medicine

## 2021-06-19 VITALS — BP 124/80 | HR 71 | Ht 71.0 in | Wt 223.0 lb

## 2021-06-19 DIAGNOSIS — Z992 Dependence on renal dialysis: Secondary | ICD-10-CM

## 2021-06-19 DIAGNOSIS — E1169 Type 2 diabetes mellitus with other specified complication: Secondary | ICD-10-CM

## 2021-06-19 DIAGNOSIS — N186 End stage renal disease: Secondary | ICD-10-CM

## 2021-06-19 DIAGNOSIS — E1165 Type 2 diabetes mellitus with hyperglycemia: Secondary | ICD-10-CM

## 2021-06-19 DIAGNOSIS — E1122 Type 2 diabetes mellitus with diabetic chronic kidney disease: Secondary | ICD-10-CM | POA: Insufficient documentation

## 2021-06-19 DIAGNOSIS — Z794 Long term (current) use of insulin: Secondary | ICD-10-CM

## 2021-06-19 MED ORDER — VICTOZA 18 MG/3ML ~~LOC~~ SOPN
1.8000 mg | PEN_INJECTOR | Freq: Every day | SUBCUTANEOUS | 3 refills | Status: DC
  Filled 2021-06-19: qty 27, 90d supply, fill #0
  Filled 2021-07-23 – 2021-07-24 (×2): qty 18, 60d supply, fill #0

## 2021-06-19 MED ORDER — INSULIN ASPART 100 UNIT/ML FLEXPEN
PEN_INJECTOR | SUBCUTANEOUS | 6 refills | Status: DC
Start: 2021-06-19 — End: 2021-07-23
  Filled 2021-06-19: qty 12, 30d supply, fill #0
  Filled 2021-07-23: qty 3, 7d supply, fill #0
  Filled 2021-07-23: qty 15, 37d supply, fill #0

## 2021-06-19 MED ORDER — INSULIN PEN NEEDLE 31G X 6 MM MISC
1.0000 | Freq: Four times a day (QID) | 3 refills | Status: DC
  Filled 2021-06-19: qty 400, fill #0

## 2021-06-19 MED ORDER — BASAGLAR KWIKPEN 100 UNIT/ML ~~LOC~~ SOPN
20.0000 [IU] | PEN_INJECTOR | Freq: Every day | SUBCUTANEOUS | 3 refills | Status: DC
  Filled 2021-06-19: qty 6, 30d supply, fill #0
  Filled 2021-07-23: qty 9, 45d supply, fill #0
  Filled 2021-07-24: qty 15, 75d supply, fill #0

## 2021-06-19 NOTE — Patient Instructions (Addendum)
Victoza 1.8 mg daily  Basaglar 20 units daily  Novolog 4 units with each meal  Novolog correctional insulin: ADD extra units on insulin to your meal-time Novolog  dose if your blood sugars are higher than 180. Use the scale below to help guide you:   Blood sugar before meal Number of units to inject  Less than 180 0 unit  181 -  230 1 units  231 -  280 2 units  281 -  330 3 units  331 -  380 4 units  381 -  430 5 units  431 -  480 6 units    HOW TO TREAT LOW BLOOD SUGARS (Blood sugar LESS THAN 70 MG/DL) Please follow the RULE OF 15 for the treatment of hypoglycemia treatment (when your (blood sugars are less than 70 mg/dL)   STEP 1: Take 15 grams of carbohydrates when your blood sugar is low, which includes:  3-4 GLUCOSE TABS  OR 3-4 OZ OF JUICE OR REGULAR SODA OR ONE TUBE OF GLUCOSE GEL    STEP 2: RECHECK blood sugar in 15 MINUTES STEP 3: If your blood sugar is still low at the 15 minute recheck --> then, go back to STEP 1 and treat AGAIN with another 15 grams of carbohydrates.

## 2021-06-20 ENCOUNTER — Other Ambulatory Visit: Payer: Self-pay

## 2021-06-20 ENCOUNTER — Ambulatory Visit (HOSPITAL_BASED_OUTPATIENT_CLINIC_OR_DEPARTMENT_OTHER): Admit: 2021-06-20 | Payer: Medicaid Other | Admitting: Orthopaedic Surgery

## 2021-06-20 ENCOUNTER — Encounter (HOSPITAL_BASED_OUTPATIENT_CLINIC_OR_DEPARTMENT_OTHER): Payer: Self-pay

## 2021-06-20 SURGERY — REPAIR, TENDON, ACHILLES
Anesthesia: General | Laterality: Left

## 2021-06-24 ENCOUNTER — Other Ambulatory Visit: Payer: Self-pay

## 2021-06-25 ENCOUNTER — Other Ambulatory Visit: Payer: Self-pay

## 2021-06-25 MED ORDER — CALCIUM ACETATE (PHOS BINDER) 667 MG PO CAPS
1334.0000 mg | ORAL_CAPSULE | Freq: Three times a day (TID) | ORAL | 3 refills | Status: AC
  Filled 2021-06-25: qty 180, 30d supply, fill #0
  Filled 2021-09-10: qty 540, 90d supply, fill #0
  Filled 2021-09-24: qty 180, 30d supply, fill #0
  Filled 2022-05-12: qty 180, 30d supply, fill #1

## 2021-06-26 ENCOUNTER — Other Ambulatory Visit: Payer: Self-pay

## 2021-07-01 ENCOUNTER — Other Ambulatory Visit: Payer: Self-pay

## 2021-07-01 DIAGNOSIS — N1832 Chronic kidney disease, stage 3b: Secondary | ICD-10-CM

## 2021-07-02 ENCOUNTER — Other Ambulatory Visit: Payer: Self-pay

## 2021-07-05 DIAGNOSIS — S86012A Strain of left Achilles tendon, initial encounter: Secondary | ICD-10-CM | POA: Diagnosis not present

## 2021-07-08 ENCOUNTER — Ambulatory Visit (INDEPENDENT_AMBULATORY_CARE_PROVIDER_SITE_OTHER)
Admission: RE | Admit: 2021-07-08 | Discharge: 2021-07-08 | Disposition: A | Discharge status: Home / Self Care | Payer: Medicaid Other | Source: Ambulatory Visit | Attending: Surgery | Admitting: Surgery

## 2021-07-08 ENCOUNTER — Ambulatory Visit (INDEPENDENT_AMBULATORY_CARE_PROVIDER_SITE_OTHER): Payer: No Typology Code available for payment source | Admitting: Surgery

## 2021-07-08 ENCOUNTER — Ambulatory Visit (HOSPITAL_COMMUNITY)
Admission: RE | Admit: 2021-07-08 | Discharge: 2021-07-08 | Disposition: A | Discharge status: Home / Self Care | Payer: Medicaid Other | Source: Ambulatory Visit | Attending: Surgery | Admitting: Surgery

## 2021-07-08 ENCOUNTER — Encounter: Payer: Self-pay | Admitting: Surgery

## 2021-07-08 ENCOUNTER — Other Ambulatory Visit: Payer: Self-pay

## 2021-07-08 VITALS — BP 187/115 | HR 86 | Temp 98.6°F | Resp 20 | Ht 71.0 in | Wt 240.0 lb

## 2021-07-08 DIAGNOSIS — N1832 Chronic kidney disease, stage 3b: Secondary | ICD-10-CM

## 2021-07-08 DIAGNOSIS — Z992 Dependence on renal dialysis: Secondary | ICD-10-CM

## 2021-07-08 DIAGNOSIS — N186 End stage renal disease: Secondary | ICD-10-CM

## 2021-07-08 NOTE — H&P (View-Only) (Signed)
? ?[facility] ? ?Patient name: Matthew Odom MRN: [id number] DOB: Jan 08, 1971 Sex: male ? ? ?REQUESTING PROVIDER:  ? ? Johnney Ou ? ? ?REASON FOR CONSULT:  ?  ?Dialysis access ? ?HISTORY OF PRESENT ILLNESS:  ? ?Matthew Odom is a 51 y.o. male, who is referred for dialysis access.  The patient is on dialysis Tuesday Thursday Saturday.  He is right-handed.  His renal failure is secondary to hypertension and hypertension.  He does not have a pacemaker or defibrillator. ? ?The patient has a history of nonobstructive coronary artery disease, status post MI.  Ejection fraction is 45-50%.  He does have a history of cocaine abuse.  He is a former smoker ? ?PAST MEDICAL HISTORY  ? ? ?Past Medical History:  ?Diagnosis Date  ? Cocaine use   ? Diabetes mellitus without complication ([place])   ? Enlarged heart   ? ESRD (end stage renal disease) ([place])   ? History of degenerative disc disease   ? Hyperlipidemia   ? Hypertension   ? NSTEMI (non-ST elevated myocardial infarction) ([place])   ? Sciatic nerve pain   ? ? ? ?FAMILY HISTORY  ? ?Family History  ?Problem Relation Age of Onset  ? Heart attack Mother   ? Hypertension Mother   ? Diabetes Mother   ? Heart disease Sister   ? Hypertension Sister   ? Hypertension Father   ? Emphysema Father   ? ? ?SOCIAL HISTORY:  ? ?Social History  ? ?Socioeconomic History  ? Marital status: Married  ?  Spouse name: Not on file  ? Number of children: Not on file  ? Years of education: Not on file  ? Highest education level: Not on file  ?Occupational History  ? Not on file  ?Tobacco Use  ? Smoking status: Former  ?  Packs/day: 1.00  ?  Years: 40.00  ?  Pack years: 40.00  ?  Types: Cigarettes  ? Smokeless tobacco: Never  ? Tobacco comments:  ?  Stopped smoking 05/21/21.   ?Substance and Sexual Activity  ? Alcohol use: Not Currently  ?  Comment: occasionally  ? Drug use: Yes  ?  Types: Cocaine  ? Sexual activity: Yes  ?Other Topics Concern  ? Not  on file  ?Social History Narrative  ? Not on file  ? ?Social Determinants of Health  ? ?Financial Resource Strain: Not on file  ?Food Insecurity: Not on file  ?Transportation Needs: Not on file  ?Physical Activity: Not on file  ?Stress: Not on file  ?Social Connections: Not on file  ?Intimate Partner Violence: Not on file  ? ? ?ALLERGIES:  ? ? ?Allergies  ?Allergen Reactions  ? Aspirin Hives  ? Nitroglycerin Other (See Comments)  ?  Nitroglycerin paste will make blood pressure go up and cause headaches. The nitroglycerin tablets do fine.  ? ? ?CURRENT MEDICATIONS:  ? ? ?Current Outpatient Medications  ?Medication Sig Dispense Refill  ? albuterol (PROVENTIL) (2.5 MG/3ML) 0.083% nebulizer solution Take 3 mLs (2.5 mg total) by nebulization every 6 (six) hours as needed for wheezing or shortness of breath. 150 mL 2  ? albuterol (VENTOLIN HFA) 108 (90 Base) MCG/ACT inhaler Inhale 2 puffs into the lungs every 6 (six) hours as needed. 18 g 5  ? amLODipine (NORVASC) 10 MG tablet Take 1 tablet (10 mg total) by mouth daily. 30 tablet 2  ? atorvastatin (LIPITOR) 80 MG tablet Take 1 tablet (80 mg total) by mouth daily. [place]  tablet 2  ? Calcium 500 MG tablet Take 1 tablet by mouth 3 times daily 90 tablet 2  ? calcium acetate (PHOSLO) 667 MG capsule Take 1 tablet by mouth three times a day with meals 90 capsule 3  ? calcium acetate (PHOSLO) 667 MG capsule Take 2 capsules (1,334 mg total) by mouth 3 (three) times daily with meals 540 capsule 3  ? calcium carbonate (OS-CAL - DOSED IN MG OF ELEMENTAL CALCIUM) 1250 (500 Ca) MG tablet Take 1 tablet (500 mg of elemental calcium total) by mouth 2 (two) times daily with a meal. 60 tablet 2  ? carvedilol (COREG) 25 MG tablet Take 1 tablet (25 mg total) by mouth 2 (two) times daily with a meal. 60 tablet 2  ? ciclopirox (PENLAC) 8 % solution APPLY TOPICALLY AT BEDTIME. APPLY TO AFFECTED TOENAILS ONCE DAILY FOR 48 WEEKS. REMOVE ONCE WEEKLY WITH NAIL POLISH REMOVER. (Patient taking  differently: Apply 1 application. topically at bedtime. Remove once weekly) 6.6 mL 11  ? doxazosin (CARDURA) 8 MG tablet Take 1 tablet (8 mg total) by mouth daily. 30 tablet 2  ? hydrALAZINE (APRESOLINE) 100 MG tablet Take 1 tablet (100 mg total) by mouth 3 (three) times daily. Take 1 tablet by mouth three times daily 90 tablet 2  ? insulin aspart (NOVOLOG) 100 UNIT/ML FlexPen Inject a maximun of 40 units units daily. 30 mL 6  ? Insulin Glargine (BASAGLAR KWIKPEN) 100 UNIT/ML Inject 20 Units into the skin daily. 30 mL 3  ? Insulin Pen Needle 31G X 6 MM MISC Use as directed in the morning, at noon, in the evening, and at bedtime. 400 each 3  ? liraglutide (VICTOZA) 18 MG/3ML SOPN Inject 1.8 mg into the skin daily. 27 mL 3  ? Misc. Devices MISC Please provide patient with insurance approved CPAP with the following settings:  autopap 15-20.  ?Large size Fisher[employer]kel Full Face Mask Forma mask and heated humidification. (Patient taking differently: Please provide patient with insurance approved CPAP with the following settings:  autopap 15-20.  ?Large size [employer] Full Face Mask Forma mask and heated humidification.) 1 each 0  ? nitroGLYCERIN (NITROSTAT) 0.4 MG SL tablet Place 1 tablet (0.4 mg total) under the tongue every 5 (five) minutes as needed for chest pain. 25 tablet 0  ? oxyCODONE (OXY IR/ROXICODONE) 5 MG immediate release tablet Take 5 mg by mouth every 4 (four) hours as needed.    ? tamsulosin (FLOMAX) 0.4 MG CAPS capsule Take 1 capsule (0.4 mg total) by mouth at bedtime. 30 capsule 2  ? Tiotropium Bromide-Olodaterol (STIOLTO RESPIMAT) 2.5-2.5 MCG/ACT AERS Inhale 2 puffs into the lungs daily. 4 g 5  ? sertraline (ZOLOFT) 25 MG tablet Take 1 tablet (25 mg total) by mouth daily. 30 tablet 0  ? ?No current facility-administered medications for this visit.  ? ? ?REVIEW OF SYSTEMS:  ? ?[X]  denotes positive finding, [ ]  denotes negative finding ?Cardiac  Comments:  ?Chest pain or chest pressure:    ?Shortness  of breath upon exertion:    ?Short of breath when lying flat:    ?Irregular heart rhythm:    ?    ?Vascular    ?Pain in calf, thigh, or hip brought on by ambulation:    ?Pain in feet at night that wakes you up from your sleep:     ?Blood clot in your veins:    ?Leg swelling:     ?    ?Pulmonary    ?Oxygen at home:    ?  Productive cough:     ?Wheezing:     ?    ?Neurologic    ?Sudden weakness in arms or legs:     ?Sudden numbness in arms or legs:     ?Sudden onset of difficulty speaking or slurred speech:    ?Temporary loss of vision in one eye:     ?Problems with dizziness:     ?    ?Gastrointestinal    ?Blood in stool:     ? ?Vomited blood:     ?    ?Genitourinary    ?Burning when urinating:     ?Blood in urine:    ?    ?Psychiatric    ?Major depression:     ?    ?Hematologic    ?Bleeding problems:    ?Problems with blood clotting too easily:    ?    ?Skin    ?Rashes or ulcers:    ?    ?Constitutional    ?Fever or chills:    ? ?PHYSICAL EXAM:  ? ?Vitals:  ? 07/08/21 1339  ?BP: (!) 187/115  ?Pulse: 86  ?Resp: 20  ?Temp: 98.6 ?F (37 ?C)  ?SpO2: 95%  ?Weight: 240 lb (108.9 kg)  ?Height: 5\' 11"  (1.803 m)  ? ? ?GENERAL: The patient is a well-nourished male, in no acute distress. The vital signs are documented above. ?CARDIAC: There is a regular rate and rhythm.  ?VASCULAR: palpable radial pulses ?PULMONARY: Nonlabored respirations ?MUSCULOSKELETAL: There are no major deformities or cyanosis. ?NEUROLOGIC: No focal weakness or paresthesias are detected. ?SKIN: There are no ulcers or rashes noted. ?PSYCHIATRIC: The patient has a normal affect. ? ?STUDIES:  ? ?I have reviewed his vascular studies with the following findings: ?+-----------------+-------------+----------+--------+  ?Left Cephalic    Diameter (cm)Depth (cm)Findings  ?+-----------------+-------------+----------+--------+  ?Shoulder             0.48                         ?+-----------------+-------------+----------+--------+  ?Prox upper arm        0.51                         ?+-----------------+-------------+----------+--------+  ?Mid upper arm        0.47                         ?+-----------------+-------------+----------+--------+  ?Dist upper arm       0.47

## 2021-07-08 NOTE — Progress Notes (Signed)
[facility] [place]  Patient name: Matthew Odom MRN: [id number] DOB: 1970-09-29 Sex: male   REQUESTING PROVIDER:    Johnney Ou   REASON FOR CONSULT:    Dialysis access  HISTORY OF PRESENT ILLNESS:   Matthew Odom is a 51 y.o. male, who is referred for dialysis access.  The patient is on dialysis Tuesday Thursday Saturday.  He is right-handed.  His renal failure is secondary to hypertension and hypertension.  He does not have a pacemaker or defibrillator.  The patient has a history of nonobstructive coronary artery disease, status post MI.  Ejection fraction is 45-50%.  He does have a history of cocaine abuse.  He is a former smoker  PAST MEDICAL HISTORY    Past Medical History:  Diagnosis Date   Cocaine use    Diabetes mellitus without complication ([place])    Enlarged heart    ESRD (end stage renal disease) ([place])    History of degenerative disc disease    Hyperlipidemia    Hypertension    NSTEMI (non-ST elevated myocardial infarction) ([place])    Sciatic nerve pain      FAMILY HISTORY   Family History  Problem Relation Age of Onset   Heart attack Mother    Hypertension Mother    Diabetes Mother    Heart disease Sister    Hypertension Sister    Hypertension Father    Emphysema Father     SOCIAL HISTORY:   Social History   Socioeconomic History   Marital status: Married    Spouse name: Not on file   Number of children: Not on file   Years of education: Not on file   Highest education level: Not on file  Occupational History   Not on file  Tobacco Use   Smoking status: Former    Packs/day: 1.00    Years: 40.00    Pack years: 40.00    Types: Cigarettes   Smokeless tobacco: Never   Tobacco comments:    Stopped smoking 05/21/21.   Substance and Sexual Activity   Alcohol use: Not Currently    Comment: occasionally   Drug use: Yes    Types: Cocaine   Sexual activity: Yes  Other Topics Concern   Not  on file  Social History Narrative   Not on file   Social Determinants of Health   Financial Resource Strain: Not on file  Food Insecurity: Not on file  Transportation Needs: Not on file  Physical Activity: Not on file  Stress: Not on file  Social Connections: Not on file  Intimate Partner Violence: Not on file    ALLERGIES:    Allergies  Allergen Reactions   Aspirin Hives   Nitroglycerin Other (See Comments)    Nitroglycerin paste will make blood pressure go up and cause headaches. The nitroglycerin tablets do fine.    CURRENT MEDICATIONS:    Current Outpatient Medications  Medication Sig Dispense Refill   albuterol (PROVENTIL) (2.5 MG/3ML) 0.083% nebulizer solution Take 3 mLs (2.5 mg total) by nebulization every 6 (six) hours as needed for wheezing or shortness of breath. 150 mL 2   albuterol (VENTOLIN HFA) 108 (90 Base) MCG/ACT inhaler Inhale 2 puffs into the lungs every 6 (six) hours as needed. 18 g 5   amLODipine (NORVASC) 10 MG tablet Take 1 tablet (10 mg total) by mouth daily. 30 tablet 2   atorvastatin (LIPITOR) 80 MG tablet Take 1 tablet (80 mg total) by mouth daily. [place]  tablet 2   Calcium 500 MG tablet Take 1 tablet by mouth 3 times daily 90 tablet 2   calcium acetate (PHOSLO) 667 MG capsule Take 1 tablet by mouth three times a day with meals 90 capsule 3   calcium acetate (PHOSLO) 667 MG capsule Take 2 capsules (1,334 mg total) by mouth 3 (three) times daily with meals 540 capsule 3   calcium carbonate (OS-CAL - DOSED IN MG OF ELEMENTAL CALCIUM) 1250 (500 Ca) MG tablet Take 1 tablet (500 mg of elemental calcium total) by mouth 2 (two) times daily with a meal. 60 tablet 2   carvedilol (COREG) 25 MG tablet Take 1 tablet (25 mg total) by mouth 2 (two) times daily with a meal. 60 tablet 2   ciclopirox (PENLAC) 8 % solution APPLY TOPICALLY AT BEDTIME. APPLY TO AFFECTED TOENAILS ONCE DAILY FOR 48 WEEKS. REMOVE ONCE WEEKLY WITH NAIL POLISH REMOVER. (Patient taking  differently: Apply 1 application. topically at bedtime. Remove once weekly) 6.6 mL 11   doxazosin (CARDURA) 8 MG tablet Take 1 tablet (8 mg total) by mouth daily. 30 tablet 2   hydrALAZINE (APRESOLINE) 100 MG tablet Take 1 tablet (100 mg total) by mouth 3 (three) times daily. Take 1 tablet by mouth three times daily 90 tablet 2   insulin aspart (NOVOLOG) 100 UNIT/ML FlexPen Inject a maximun of 40 units units daily. 30 mL 6   Insulin Glargine (BASAGLAR KWIKPEN) 100 UNIT/ML Inject 20 Units into the skin daily. 30 mL 3   Insulin Pen Needle 31G X 6 MM MISC Use as directed in the morning, at noon, in the evening, and at bedtime. 400 each 3   liraglutide (VICTOZA) 18 MG/3ML SOPN Inject 1.8 mg into the skin daily. 27 mL 3   Misc. Devices MISC Please provide patient with insurance approved CPAP with the following settings:  autopap 15-20.  Large size [employer] Full Face Mask Forma mask and heated humidification. (Patient taking differently: Please provide patient with insurance approved CPAP with the following settings:  autopap 15-20.  Large size [employer] Full Face Mask Forma mask and heated humidification.) 1 each 0   nitroGLYCERIN (NITROSTAT) 0.4 MG SL tablet Place 1 tablet (0.4 mg total) under the tongue every 5 (five) minutes as needed for chest pain. 25 tablet 0   oxyCODONE (OXY IR/ROXICODONE) 5 MG immediate release tablet Take 5 mg by mouth every 4 (four) hours as needed.     tamsulosin (FLOMAX) 0.4 MG CAPS capsule Take 1 capsule (0.4 mg total) by mouth at bedtime. 30 capsule 2   Tiotropium Bromide-Olodaterol (STIOLTO RESPIMAT) 2.5-2.5 MCG/ACT AERS Inhale 2 puffs into the lungs daily. 4 g 5   sertraline (ZOLOFT) 25 MG tablet Take 1 tablet (25 mg total) by mouth daily. 30 tablet 0   No current facility-administered medications for this visit.    REVIEW OF SYSTEMS:   [X]  denotes positive finding, [ ]  denotes negative finding Cardiac  Comments:  Chest pain or chest pressure:    Shortness  of breath upon exertion:    Short of breath when lying flat:    Irregular heart rhythm:        Vascular    Pain in calf, thigh, or hip brought on by ambulation:    Pain in feet at night that wakes you up from your sleep:     Blood clot in your veins:    Leg swelling:         Pulmonary    Oxygen at home:  Productive cough:     Wheezing:         Neurologic    Sudden weakness in arms or legs:     Sudden numbness in arms or legs:     Sudden onset of difficulty speaking or slurred speech:    Temporary loss of vision in one eye:     Problems with dizziness:         Gastrointestinal    Blood in stool:      Vomited blood:         Genitourinary    Burning when urinating:     Blood in urine:        Psychiatric    Major depression:         Hematologic    Bleeding problems:    Problems with blood clotting too easily:        Skin    Rashes or ulcers:        Constitutional    Fever or chills:     PHYSICAL EXAM:   Vitals:   07/08/21 1339  BP: (!) 187/115  Pulse: 86  Resp: 20  Temp: 98.6 F (37 C)  SpO2: 95%  Weight: 240 lb (108.9 kg)  Height: 5\' 11"  (1.803 m)    GENERAL: The patient is a well-nourished male, in no acute distress. The vital signs are documented above. CARDIAC: There is a regular rate and rhythm.  VASCULAR: palpable radial pulses PULMONARY: Nonlabored respirations MUSCULOSKELETAL: There are no major deformities or cyanosis. NEUROLOGIC: No focal weakness or paresthesias are detected. SKIN: There are no ulcers or rashes noted. PSYCHIATRIC: The patient has a normal affect.  STUDIES:   I have reviewed his vascular studies with the following findings: +-----------------+-------------+----------+--------+   Left Cephalic     Diameter (cm) Depth (cm) Findings   +-----------------+-------------+----------+--------+   Shoulder              0.48                            +-----------------+-------------+----------+--------+   Prox upper arm         0.51                            +-----------------+-------------+----------+--------+   Mid upper arm         0.47                            +-----------------+-------------+----------+--------+   Dist upper arm        0.47                            +-----------------+-------------+----------+--------+   Antecubital fossa     0.61                            +-----------------+-------------+----------+--------+   Prox forearm          0.46                            +-----------------+-------------+----------+--------+   Mid forearm           0.46                            +-----------------+-------------+----------+--------+  Dist forearm          0.46                            +-----------------+-------------+----------+--------+   +-----------------+-------------+----------+--------+   Left Basilic      Diameter (cm) Depth (cm) Findings   +-----------------+-------------+----------+--------+   Prox upper arm        0.86                            +-----------------+-------------+----------+--------+   Mid upper arm         0.30                            +-----------------+-------------+----------+--------+   Dist upper arm        0.41                            +-----------------+-------------+----------+--------+   Antecubital fossa     0.39                            +-----------------+-------------+----------+--------+   Prox forearm          0.29                            +-----------------+-------------+----------+--------+  Arterial waveforms are triphasic. ASSESSMENT and PLAN   ESRD: The patient is right-handed.  He has Left cephalic vein.  I discussed with proceeding a left radiocephalic versus brachiocephalic fistula.  I discussed the risk of maturity, the need for future interventions, and the risk of steal syndrome.  All of his questions were answered.  His procedure is been scheduled for Wednesday, March 15.   Leia Alf, MD, FACS [facility] [phone] Pager [phone]

## 2021-07-09 ENCOUNTER — Other Ambulatory Visit: Payer: Self-pay

## 2021-07-15 ENCOUNTER — Other Ambulatory Visit: Payer: Self-pay

## 2021-07-15 ENCOUNTER — Encounter (HOSPITAL_COMMUNITY): Payer: Self-pay | Admitting: Surgery

## 2021-07-15 NOTE — Progress Notes (Signed)
Matthew Odom denies chest pain or shortness of breath. Patient denies having any s/s of Covid in his household.  Patient denies any known exposure to Covid.  ? ?Matthew Odom has type II diabetes, patient reports that he checks CBG 1 time a day, CBGs run 100 - 200. I instructed Matthew Odom to take 10 units of Basaglar Insulin10 units at hs, DOS if CBG > 70, take 10 units of Basaglar Insulin. I instructed patient to hold Victoza am of surgery. I instructed Matthew Odom to check CBG after awaking and every 2 hours until arrival  to the hospital.  ?I Instructed patient if CBG is less than 70 to take 4 Glucose Tablets or 1 tube of Glucose Gel or 1/2 cup of a clear juice. Recheck CBG in 15 minutes if CBG is not over 70 call, pre- op desk at [phone] for further instructions. If scheduled to receive Insulin, do not take Insulin Last A1C was 10.5 drawn on 06/14/21 ? ?Matthew Odom reports that his blood pressure continues to run high, but not as high  as it was in the past, highs now are like 140/90. ? ?PCP Amy Remo Lipps NP. ?

## 2021-07-16 ENCOUNTER — Encounter (HOSPITAL_BASED_OUTPATIENT_CLINIC_OR_DEPARTMENT_OTHER): Payer: Self-pay

## 2021-07-16 ENCOUNTER — Other Ambulatory Visit: Payer: Self-pay

## 2021-07-16 ENCOUNTER — Emergency Department (HOSPITAL_BASED_OUTPATIENT_CLINIC_OR_DEPARTMENT_OTHER)
Admission: EM | Admit: 2021-07-16 | Discharge: 2021-07-16 | Disposition: A | Discharge status: Home / Self Care | Payer: Medicaid Other | Attending: Emergency Medicine | Admitting: Emergency Medicine

## 2021-07-16 ENCOUNTER — Emergency Department (HOSPITAL_BASED_OUTPATIENT_CLINIC_OR_DEPARTMENT_OTHER): Payer: Medicaid Other | Admitting: Radiology

## 2021-07-16 DIAGNOSIS — Z79899 Other long term (current) drug therapy: Secondary | ICD-10-CM | POA: Insufficient documentation

## 2021-07-16 DIAGNOSIS — N186 End stage renal disease: Secondary | ICD-10-CM | POA: Insufficient documentation

## 2021-07-16 DIAGNOSIS — Z794 Long term (current) use of insulin: Secondary | ICD-10-CM | POA: Diagnosis not present

## 2021-07-16 DIAGNOSIS — R0789 Other chest pain: Secondary | ICD-10-CM | POA: Diagnosis not present

## 2021-07-16 DIAGNOSIS — Z992 Dependence on renal dialysis: Secondary | ICD-10-CM | POA: Diagnosis not present

## 2021-07-16 DIAGNOSIS — J449 Chronic obstructive pulmonary disease, unspecified: Secondary | ICD-10-CM | POA: Insufficient documentation

## 2021-07-16 DIAGNOSIS — I120 Hypertensive chronic kidney disease with stage 5 chronic kidney disease or end stage renal disease: Secondary | ICD-10-CM | POA: Diagnosis not present

## 2021-07-16 DIAGNOSIS — R42 Dizziness and giddiness: Secondary | ICD-10-CM | POA: Insufficient documentation

## 2021-07-16 DIAGNOSIS — Z7951 Long term (current) use of inhaled steroids: Secondary | ICD-10-CM | POA: Insufficient documentation

## 2021-07-16 DIAGNOSIS — R079 Chest pain, unspecified: Secondary | ICD-10-CM

## 2021-07-16 DIAGNOSIS — R61 Generalized hyperhidrosis: Secondary | ICD-10-CM | POA: Diagnosis not present

## 2021-07-16 LAB — TROPONIN I (HIGH SENSITIVITY)
Troponin I (High Sensitivity): 53 ng/L — ABNORMAL HIGH (ref <18)
Troponin I (High Sensitivity): 49 ng/L — ABNORMAL HIGH (ref <18)

## 2021-07-16 LAB — CBC
HCT: 32.7 % — ABNORMAL LOW (ref 39.0–52.0)
Hemoglobin: 10.7 g/dL — ABNORMAL LOW (ref 13.0–17.0)
MCH: 26.9 pg (ref 26.0–34.0)
MCHC: 32.7 g/dL (ref 30.0–36.0)
MCV: 82.2 fL (ref 80.0–100.0)
Platelets: 255 10*3/uL (ref 150–400)
RBC: 3.98 MIL/uL — ABNORMAL LOW (ref 4.22–5.81)
RDW: 15.1 % (ref 11.5–15.5)
WBC: 7.6 10*3/uL (ref 4.0–10.5)
nRBC: 0 % (ref 0.0–0.2)

## 2021-07-16 LAB — BASIC METABOLIC PANEL
Anion gap: 13 (ref 5–15)
BUN: 25 mg/dL — ABNORMAL HIGH (ref 6–20)
CO2: 26 mmol/L (ref 22–32)
Calcium: 9 mg/dL (ref 8.9–10.3)
Chloride: 99 mmol/L (ref 98–111)
Creatinine, Ser: 3.44 mg/dL — ABNORMAL HIGH (ref 0.61–1.24)
GFR, Estimated: 21 mL/min — ABNORMAL LOW (ref >60)
Glucose, Bld: 194 mg/dL — ABNORMAL HIGH (ref 70–99)
Potassium: 3.5 mmol/L (ref 3.5–5.1)
Sodium: 138 mmol/L (ref 135–145)

## 2021-07-16 MED ORDER — NITROGLYCERIN 0.4 MG SL SUBL
0.4000 mg | SUBLINGUAL_TABLET | SUBLINGUAL | Status: DC | PRN
  Administered 2021-07-16: 0.4 mg via SUBLINGUAL
  Filled 2021-07-16: qty 1

## 2021-07-16 MED ORDER — NITROGLYCERIN 0.4 MG SL SUBL
0.4000 mg | SUBLINGUAL_TABLET | SUBLINGUAL | 0 refills | Status: DC | PRN
  Filled 2021-07-16: qty 30, 1d supply, fill #0
  Filled 2022-05-14: qty 25, 1d supply, fill #0

## 2021-07-16 NOTE — ED Triage Notes (Signed)
Pt presents to the ED from home with left sided Chest pain that started within the hour. States that he does have a history of an MI in 2018. Pt reports taking one nitro about 20 minutes ago with little relief. Pt does report diaphoresis. Denies nausea or vomiting. Pt A&Ox4 at time of triage. VSS. EKG obtained. ?

## 2021-07-16 NOTE — ED Notes (Signed)
Pt verbalizes understanding of discharge instructions. Opportunity for questioning and answers were provided. Pt discharged from ED to home with wife.    

## 2021-07-16 NOTE — ED Provider Notes (Signed)
?[facility] EMERGENC[facility] DEPT ?Provider Note ? ? ?CSN: [id number] ?Arrival date & time: 07/16/21  1950 ? ?  ? ?History ? ?Chief Complaint  ?Patient presents with  ? Chest Pain  ? ? ?Matthew Odom is a 51 y.o. male.  With past medical history of hypertension, T2DM, end-stage renal disease on hemodialysis TTS, COPD, NSTEMI who presents to the emergency department with chest pain ? ?Patient states that a little after 7 PM he began having chest pain while watching television.  He describes it as a left-sided chest pressure with jaw tightness.  Additionally he had episode of diaphoresis and lightheadedness.  He states that he took a nitroglycerin which did not help.  He states that the pain got worse and so presented to the emergency department.  He states that it is gone down some but is still having dull pain that is present.  He denies palpitations, lower extremity swelling, nausea or shortness of breath. ? ? ?Chest Pain ?Associated symptoms: diaphoresis   ?Associated symptoms: no nausea, no palpitations and no shortness of breath   ? ?  ? ?Home Medications ?Prior to Admission medications   ?Medication Sig Start Date End Date Taking? Authorizing Provider  ?acetaminophen (TYLENOL) 500 MG tablet Take 500-1,000 mg by mouth every 6 (six) hours as needed (pain.).    [provider]  ?albuterol (PROVENTIL) (2.5 MG/3ML) 0.083% nebulizer solution Take 3 mLs (2.5 mg total) by nebulization every 6 (six) hours as needed for wheezing or shortness of breath. 06/14/21   Spero Geralds, MD  ?albuterol (VENTOLIN HFA) 108 (90 Base) MCG/ACT inhaler Inhale 2 puffs into the lungs every 6 (six) hours as needed. 06/14/21   Spero Geralds, MD  ?amLODipine (NORVASC) 10 MG tablet Take 1 tablet (10 mg total) by mouth daily. 05/10/21 08/08/21  [place] Eric J, DO  ?atorvastatin (LIPITOR) 80 MG tablet Take 1 tablet (80 mg total) by mouth daily. 05/10/21 08/08/21  [place] Donnamarie Poag, DO  ?budesonide-formoterol (SYMBICORT) 160-4.5 MCG/ACT  inhaler Inhale 2 puffs into the lungs daily.    [provider]  ?Calcium 500 MG tablet Take 1 tablet by mouth 3 times daily ?Patient not taking: Reported on 07/16/2021 05/14/21   Justin Mend, MD  ?calcium acetate (PHOSLO) 667 MG capsule Take 1 tablet by mouth three times a day with meals 06/13/21     ?calcium acetate (PHOSLO) 667 MG capsule Take 2 capsules (1,334 mg total) by mouth 3 (three) times daily with meals 06/25/21     ?calcium carbonate (OS-CAL - DOSED IN MG OF ELEMENTAL CALCIUM) 1250 (500 Ca) MG tablet Take 1 tablet (500 mg of elemental calcium total) by mouth 2 (two) times daily with a meal. ?Patient taking differently: Take 3 tablets by mouth See admin instructions. Take 3 tablets with each meal & with each snack 05/10/21 08/08/21  [place] Donnamarie Poag, DO  ?carvedilol (COREG) 25 MG tablet Take 1 tablet (25 mg total) by mouth 2 (two) times daily with a meal. 05/10/21 08/08/21  [place] Donnamarie Poag, DO  ?ciclopirox (PENLAC) 8 % solution APPLY TOPICALLY AT BEDTIME. APPLY TO AFFECTED TOENAILS ONCE DAILY FOR 48 WEEKS. REMOVE ONCE WEEKLY WITH NAIL POLISH REMOVER. ?Patient taking differently: Apply 1 application. topically at bedtime. Remove once weekly 07/27/20 07/27/21  Marzetta Board, DPM  ?doxazosin (CARDURA) 8 MG tablet Take 1 tablet (8 mg total) by mouth daily. ?Patient taking differently: Take 8 mg by mouth at bedtime. 05/11/21 08/09/21  [place] Eric J, DO  ?hydrALAZINE (  APRESOLINE) 100 MG tablet Take 1 tablet (100 mg total) by mouth 3 (three) times daily. Take 1 tablet by mouth three times daily 05/10/21 08/08/21  [place] Donnamarie Poag, DO  ?insulin aspart (NOVOLOG) 100 UNIT/ML FlexPen Inject a maximun of 40 units units daily. ?Patient taking differently: Inject 4-10 Units into the skin 3 (three) times daily with meals. Sliding scale insulin 06/19/21   Shamleffer, Melanie Crazier, MD  ?Insulin Glargine (BASAGLAR KWIKPEN) 100 UNIT/ML Inject 20 Units into the skin daily. ?Patient taking differently: Inject 10 Units into  the skin 2 (two) times daily. \ 06/19/21   Shamleffer, Melanie Crazier, MD  ?Insulin Pen Needle 31G X 6 MM MISC Use as directed in the morning, at noon, in the evening, and at bedtime. 06/19/21   Shamleffer, Melanie Crazier, MD  ?liraglutide (VICTOZA) 18 MG/3ML SOPN Inject 1.8 mg into the skin daily. 06/19/21   Shamleffer, Melanie Crazier, MD  ?Misc. Devices MISC Please provide patient with insurance approved CPAP with the following settings:  autopap 15-20.  ?Large size Fisher[employer]kel Full Face Mask Forma mask and heated humidification. ?Patient taking differently: Please provide patient with insurance approved CPAP with the following settings:  autopap 15-20.  ?Large size [employer] Full Face Mask Forma mask and heated humidification. 12/25/19   [name], NP  ?nitroGLYCERIN (NITROSTAT) 0.4 MG SL tablet Place 1 tablet (0.4 mg total) under the tongue every 5 (five) minutes as needed for chest pain. 05/10/21   [place] Eric J, DO  ?oxyCODONE (OXY IR/ROXICODONE) 5 MG immediate release tablet Take 5 mg by mouth every 4 (four) hours as needed (pain.). 06/12/21   [provider]  ?sertraline (ZOLOFT) 25 MG tablet Take 1 tablet (25 mg total) by mouth daily. 05/21/21 06/21/21  Camillia Herter, NP  ?tamsulosin (FLOMAX) 0.4 MG CAPS capsule Take 1 capsule (0.4 mg total) by mouth at bedtime. 05/10/21 08/08/21  [place] Eric J, DO  ?Tiotropium Bromide-Olodaterol (STIOLTO RESPIMAT) 2.5-2.5 MCG/ACT AERS Inhale 2 puffs into the lungs daily. 06/14/21   Spero Geralds, MD  ?   ? ?Allergies    ?Aspirin and Nitroglycerin   ? ?Review of Systems   ?Review of Systems  ?Constitutional:  Positive for diaphoresis.  ?Respiratory:  Negative for shortness of breath.   ?Cardiovascular:  Positive for chest pain. Negative for palpitations and leg swelling.  ?Gastrointestinal:  Negative for nausea.  ?Neurological:  Positive for light-headedness. Negative for syncope.  ?All other systems reviewed and are negative. ? ?Physical Exam ?Updated  Vital Signs ?BP 101/73 (BP Location: Left Arm)   Pulse (!) 106   Temp 97.7 ?F (36.5 ?C)   Resp 20   Ht 5\' 11"  (1.803 m)   Wt 112.9 kg   SpO2 94%   BMI 34.73 kg/m?  ?Physical Exam ?Vitals and nursing note reviewed.  ?Constitutional:   ?   General: He is not in acute distress. ?   Appearance: Normal appearance. He is well-developed. He is not toxic-appearing.  ?HENT:  ?   Head: Normocephalic and atraumatic.  ?   Mouth/Throat:  ?   Mouth: Mucous membranes are moist.  ?   Pharynx: Oropharynx is clear.  ?Eyes:  ?   General: No scleral icterus. ?   Extraocular Movements: Extraocular movements intact.  ?   Pupils: Pupils are equal, round, and reactive to light.  ?Neck:  ?   Vascular: No JVD.  ?Cardiovascular:  ?   Rate and Rhythm: Normal rate and regular rhythm.  ?   Pulses:     ?  Radial pulses are 2+ on the right side and 2+ on the left side.  ?   Heart sounds: Normal heart sounds. No murmur heard. ?Pulmonary:  ?   Effort: Pulmonary effort is normal. No tachypnea or respiratory distress.  ?   Breath sounds: Normal breath sounds.  ?Chest:  ?   Chest wall: No tenderness.  ?Abdominal:  ?   General: Bowel sounds are normal.  ?   Palpations: Abdomen is soft.  ?Musculoskeletal:     ?   General: Normal range of motion.  ?   Cervical back: Neck supple.  ?   Right lower leg: No edema.  ?   Left lower leg: No edema.  ?Skin: ?   General: Skin is warm and dry.  ?   Capillary Refill: Capillary refill takes less than 2 seconds.  ?Neurological:  ?   General: No focal deficit present.  ?   Mental Status: He is alert and oriented to person, place, and time.  ?Psychiatric:     ?   Mood and Affect: Mood normal.     ?   Behavior: Behavior normal.     ?   Thought Content: Thought content normal.     ?   Judgment: Judgment normal.  ? ? ?ED Results / Procedures / Treatments   ?Labs ?(all labs ordered are listed, but only abnormal results are displayed) ?Labs Reviewed  ?BASIC METABOLIC PANEL - Abnormal; Notable for the following  components:  ?    Result Value  ? Glucose, Bld 194 (*)   ? BUN 25 (*)   ? Creatinine, Ser 3.44 (*)   ? GFR, Estimated 21 (*)   ? All other components within normal limits  ?CBC - Abnormal; Notable for the following c

## 2021-07-16 NOTE — ED Notes (Addendum)
Patients wife Romelle Starcher called wanting to speak with a nurse, states she has been unable to contact patient. Contact info in chart for wife is correct. ?

## 2021-07-17 ENCOUNTER — Ambulatory Visit (HOSPITAL_COMMUNITY): Payer: Medicaid Other | Admitting: Emergency Medicine

## 2021-07-17 ENCOUNTER — Other Ambulatory Visit: Payer: Self-pay

## 2021-07-17 ENCOUNTER — Ambulatory Visit (HOSPITAL_COMMUNITY)
Admission: RE | Admit: 2021-07-17 | Discharge: 2021-07-17 | Disposition: A | Discharge status: Home / Self Care | Payer: Medicaid Other | Attending: Surgery | Admitting: Surgery

## 2021-07-17 ENCOUNTER — Ambulatory Visit (HOSPITAL_BASED_OUTPATIENT_CLINIC_OR_DEPARTMENT_OTHER): Payer: Medicaid Other | Admitting: Emergency Medicine

## 2021-07-17 ENCOUNTER — Encounter (HOSPITAL_COMMUNITY)
Admission: RE | Disposition: A | Discharge status: Home / Self Care | Payer: Self-pay | Source: Home / Self Care | Attending: Surgery

## 2021-07-17 ENCOUNTER — Encounter (HOSPITAL_COMMUNITY): Payer: Self-pay | Admitting: Surgery

## 2021-07-17 DIAGNOSIS — I509 Heart failure, unspecified: Secondary | ICD-10-CM

## 2021-07-17 DIAGNOSIS — I252 Old myocardial infarction: Secondary | ICD-10-CM | POA: Insufficient documentation

## 2021-07-17 DIAGNOSIS — E1122 Type 2 diabetes mellitus with diabetic chronic kidney disease: Secondary | ICD-10-CM | POA: Insufficient documentation

## 2021-07-17 DIAGNOSIS — N186 End stage renal disease: Secondary | ICD-10-CM | POA: Insufficient documentation

## 2021-07-17 DIAGNOSIS — Z794 Long term (current) use of insulin: Secondary | ICD-10-CM | POA: Diagnosis not present

## 2021-07-17 DIAGNOSIS — F1411 Cocaine abuse, in remission: Secondary | ICD-10-CM | POA: Diagnosis not present

## 2021-07-17 DIAGNOSIS — I2510 Atherosclerotic heart disease of native coronary artery without angina pectoris: Secondary | ICD-10-CM | POA: Diagnosis not present

## 2021-07-17 DIAGNOSIS — Z7985 Long-term (current) use of injectable non-insulin antidiabetic drugs: Secondary | ICD-10-CM | POA: Diagnosis not present

## 2021-07-17 DIAGNOSIS — I132 Hypertensive heart and chronic kidney disease with heart failure and with stage 5 chronic kidney disease, or end stage renal disease: Secondary | ICD-10-CM | POA: Diagnosis not present

## 2021-07-17 DIAGNOSIS — E785 Hyperlipidemia, unspecified: Secondary | ICD-10-CM | POA: Insufficient documentation

## 2021-07-17 DIAGNOSIS — Z87891 Personal history of nicotine dependence: Secondary | ICD-10-CM | POA: Diagnosis not present

## 2021-07-17 DIAGNOSIS — N185 Chronic kidney disease, stage 5: Secondary | ICD-10-CM

## 2021-07-17 DIAGNOSIS — I25119 Atherosclerotic heart disease of native coronary artery with unspecified angina pectoris: Secondary | ICD-10-CM

## 2021-07-17 DIAGNOSIS — I1311 Hypertensive heart and chronic kidney disease without heart failure, with stage 5 chronic kidney disease, or end stage renal disease: Secondary | ICD-10-CM | POA: Insufficient documentation

## 2021-07-17 DIAGNOSIS — Z79899 Other long term (current) drug therapy: Secondary | ICD-10-CM | POA: Diagnosis not present

## 2021-07-17 DIAGNOSIS — Z992 Dependence on renal dialysis: Secondary | ICD-10-CM | POA: Diagnosis not present

## 2021-07-17 HISTORY — DX: Pneumonia, unspecified organism: J18.9

## 2021-07-17 HISTORY — DX: Anxiety disorder, unspecified: F41.9

## 2021-07-17 HISTORY — DX: Chronic obstructive pulmonary disease, unspecified: J44.9

## 2021-07-17 HISTORY — DX: Other complications of anesthesia, initial encounter: T88.59XA

## 2021-07-17 HISTORY — PX: AV FISTULA PLACEMENT: SHX1204

## 2021-07-17 HISTORY — DX: Atherosclerotic heart disease of native coronary artery without angina pectoris: I25.10

## 2021-07-17 HISTORY — DX: Dyspnea, unspecified: R06.00

## 2021-07-17 HISTORY — DX: Cerebral infarction, unspecified: I63.9

## 2021-07-17 LAB — POCT I-STAT, CHEM 8
BUN: 36 mg/dL — ABNORMAL HIGH (ref 6–20)
Calcium, Ion: 1.06 mmol/L — ABNORMAL LOW (ref 1.15–1.40)
Chloride: 101 mmol/L (ref 98–111)
Creatinine, Ser: 4.6 mg/dL — ABNORMAL HIGH (ref 0.61–1.24)
Glucose, Bld: 218 mg/dL — ABNORMAL HIGH (ref 70–99)
HCT: 32 % — ABNORMAL LOW (ref 39.0–52.0)
Hemoglobin: 10.9 g/dL — ABNORMAL LOW (ref 13.0–17.0)
Potassium: 4.2 mmol/L (ref 3.5–5.1)
Sodium: 137 mmol/L (ref 135–145)
TCO2: 26 mmol/L (ref 22–32)

## 2021-07-17 LAB — GLUCOSE, CAPILLARY
Glucose-Capillary: 220 mg/dL — ABNORMAL HIGH (ref 70–99)
Glucose-Capillary: 131 mg/dL — ABNORMAL HIGH (ref 70–99)
Glucose-Capillary: 112 mg/dL — ABNORMAL HIGH (ref 70–99)

## 2021-07-17 SURGERY — ARTERIOVENOUS (AV) FISTULA CREATION
Anesthesia: Monitor Anesthesia Care | Site: Arm Lower | Laterality: Left

## 2021-07-17 MED ORDER — HEPARIN 6000 UNIT IRRIGATION SOLUTION
Status: AC
  Filled 2021-07-17: qty 500

## 2021-07-17 MED ORDER — 0.9 % SODIUM CHLORIDE (POUR BTL) OPTIME
TOPICAL | Status: DC | PRN
Start: 2021-07-17 — End: 2021-07-17
  Administered 2021-07-17: 1000 mL

## 2021-07-17 MED ORDER — DEXMEDETOMIDINE HCL IN NACL 400 MCG/100ML IV SOLN
INTRAVENOUS | Status: DC | PRN
  Administered 2021-07-17: .5 ug/kg/h via INTRAVENOUS

## 2021-07-17 MED ORDER — ACETAMINOPHEN 10 MG/ML IV SOLN: 1000.0000 mg | Freq: Once | INTRAVENOUS | Status: DC | PRN

## 2021-07-17 MED ORDER — CEFAZOLIN SODIUM-DEXTROSE 2-4 GM/100ML-% IV SOLN
2.0000 g | INTRAVENOUS | Status: AC
  Administered 2021-07-17: 2 g via INTRAVENOUS
  Filled 2021-07-17: qty 100

## 2021-07-17 MED ORDER — FENTANYL CITRATE (PF) 250 MCG/5ML IJ SOLN
INTRAMUSCULAR | Status: AC
  Filled 2021-07-17: qty 5

## 2021-07-17 MED ORDER — DEXMEDETOMIDINE (PRECEDEX) IN NS 20 MCG/5ML (4 MCG/ML) IV SYRINGE
PREFILLED_SYRINGE | INTRAVENOUS | Status: DC | PRN
  Administered 2021-07-17: 20 ug via INTRAVENOUS

## 2021-07-17 MED ORDER — HEPARIN 6000 UNIT IRRIGATION SOLUTION
Status: DC | PRN
  Administered 2021-07-17: 1

## 2021-07-17 MED ORDER — ACETAMINOPHEN 500 MG PO TABS: 1000.0000 mg | ORAL_TABLET | Freq: Once | ORAL | Status: DC | PRN

## 2021-07-17 MED ORDER — CHLORHEXIDINE GLUCONATE 0.12 % MT SOLN
OROMUCOSAL | Status: AC
  Filled 2021-07-17: qty 15

## 2021-07-17 MED ORDER — FENTANYL CITRATE (PF) 100 MCG/2ML IJ SOLN: 25.0000 ug | INTRAMUSCULAR | Status: DC | PRN

## 2021-07-17 MED ORDER — INSULIN ASPART 100 UNIT/ML IJ SOLN
INTRAMUSCULAR | Status: AC
  Filled 2021-07-17: qty 1

## 2021-07-17 MED ORDER — SODIUM CHLORIDE 0.9 % IV SOLN: INTRAVENOUS | Status: DC

## 2021-07-17 MED ORDER — ACETAMINOPHEN 160 MG/5ML PO SOLN: 1000.0000 mg | Freq: Once | ORAL | Status: DC | PRN

## 2021-07-17 MED ORDER — INSULIN ASPART 100 UNIT/ML IJ SOLN
0.0000 [IU] | INTRAMUSCULAR | Status: DC | PRN
  Administered 2021-07-17: 6 [IU] via SUBCUTANEOUS

## 2021-07-17 MED ORDER — LIDOCAINE-EPINEPHRINE (PF) 1 %-1:200000 IJ SOLN
INTRAMUSCULAR | Status: AC
  Filled 2021-07-17: qty 30

## 2021-07-17 MED ORDER — MEPIVACAINE HCL (PF) 2 % IJ SOLN
INTRAMUSCULAR | Status: DC | PRN
  Administered 2021-07-17: 10 mL

## 2021-07-17 MED ORDER — LIDOCAINE-EPINEPHRINE 2 %-1:100000 IJ SOLN
INTRAMUSCULAR | Status: DC | PRN
  Administered 2021-07-17: 20 mL via PERINEURAL

## 2021-07-17 MED ORDER — CHLORHEXIDINE GLUCONATE 0.12 % MT SOLN
15.0000 mL | Freq: Once | OROMUCOSAL | Status: AC
  Administered 2021-07-17: 15 mL via OROMUCOSAL

## 2021-07-17 MED ORDER — FENTANYL CITRATE (PF) 100 MCG/2ML IJ SOLN
INTRAMUSCULAR | Status: AC
  Filled 2021-07-17: qty 2

## 2021-07-17 MED ORDER — CHLORHEXIDINE GLUCONATE 4 % EX LIQD: 60.0000 mL | Freq: Once | CUTANEOUS | Status: DC

## 2021-07-17 MED ORDER — MIDAZOLAM HCL 2 MG/2ML IJ SOLN
INTRAMUSCULAR | Status: AC
  Filled 2021-07-17: qty 2

## 2021-07-17 SURGICAL SUPPLY — 36 items
ADH SKN CLS APL DERMABOND .7 (GAUZE/BANDAGES/DRESSINGS) ×1
ARMBAND PINK RESTRICT EXTREMIT (MISCELLANEOUS) ×6 IMPLANT
BAG COUNTER SPONGE SURGICOUNT (BAG) ×3 IMPLANT
BAG SPNG CNTER NS LX DISP (BAG) ×1
CANISTER SUCT 3000ML PPV (MISCELLANEOUS) ×3 IMPLANT
CLIP VESOCCLUDE MED 6/CT (CLIP) ×3 IMPLANT
CLIP VESOCCLUDE SM WIDE 6/CT (CLIP) ×3 IMPLANT
COVER PROBE W GEL 5X96 (DRAPES) ×3 IMPLANT
DERMABOND ADVANCED (GAUZE/BANDAGES/DRESSINGS) ×1
DERMABOND ADVANCED .7 DNX12 (GAUZE/BANDAGES/DRESSINGS) ×2 IMPLANT
ELECT REM PT RETURN 9FT ADLT (ELECTROSURGICAL) ×2
ELECTRODE REM PT RTRN 9FT ADLT (ELECTROSURGICAL) ×2 IMPLANT
GAUZE 4X4 16PLY ~~LOC~~+RFID DBL (SPONGE) ×1 IMPLANT
GLOVE SRG 8 PF TXTR STRL LF DI (GLOVE) ×2 IMPLANT
GLOVE SURG POLYISO LF SZ7.5 (GLOVE) ×3 IMPLANT
GLOVE SURG UNDER POLY LF SZ8 (GLOVE) ×2
GOWN STRL REUS W/ TWL LRG LVL3 (GOWN DISPOSABLE) ×4 IMPLANT
GOWN STRL REUS W/ TWL XL LVL3 (GOWN DISPOSABLE) ×2 IMPLANT
GOWN STRL REUS W/TWL LRG LVL3 (GOWN DISPOSABLE) ×4
GOWN STRL REUS W/TWL XL LVL3 (GOWN DISPOSABLE) ×2
HEMOSTAT SNOW SURGICEL 2X4 (HEMOSTASIS) IMPLANT
KIT BASIN OR (CUSTOM PROCEDURE TRAY) ×3 IMPLANT
KIT TURNOVER KIT B (KITS) ×3 IMPLANT
NS IRRIG 1000ML POUR BTL (IV SOLUTION) ×3 IMPLANT
PACK CV ACCESS (CUSTOM PROCEDURE TRAY) ×3 IMPLANT
PAD ARMBOARD 7.5X6 YLW CONV (MISCELLANEOUS) ×6 IMPLANT
SLING ARM FOAM STRAP LRG (SOFTGOODS) IMPLANT
SLING ARM FOAM STRAP MED (SOFTGOODS) IMPLANT
SPONGE T-LAP 18X18 ~~LOC~~+RFID (SPONGE) ×1 IMPLANT
SUT PROLENE 6 0 CC (SUTURE) ×3 IMPLANT
SUT VIC AB 3-0 SH 27 (SUTURE) ×2
SUT VIC AB 3-0 SH 27X BRD (SUTURE) ×2 IMPLANT
SUT VICRYL 4-0 PS2 18IN ABS (SUTURE) ×3 IMPLANT
TOWEL GREEN STERILE (TOWEL DISPOSABLE) ×3 IMPLANT
UNDERPAD 30X36 HEAVY ABSORB (UNDERPADS AND DIAPERS) ×3 IMPLANT
WATER STERILE IRR 1000ML POUR (IV SOLUTION) ×3 IMPLANT

## 2021-07-17 NOTE — Interval H&P Note (Signed)
History and Physical Interval Note: ? ?07/17/2021 ?8:16 AM ? ?Matthew Odom  has presented today for surgery, with the diagnosis of ESRD.  The various methods of treatment have been discussed with the patient and family. After consideration of risks, benefits and other options for treatment, the patient has consented to  Procedure(s): ?LEFT ARM ARTERIOVENOUS (AV) FISTULA CREATION (Left) as a surgical intervention.  The patient's history has been reviewed, patient examined, no change in status, stable for surgery.  I have reviewed the patient's chart and labs.  Questions were answered to the patient's satisfaction.   ? ? ?Matthew Odom ? ? ?

## 2021-07-17 NOTE — Anesthesia Procedure Notes (Signed)
Anesthesia Regional Block: Supraclavicular block  ? ?Pre-Anesthetic Checklist: , timeout performed,  Correct Patient, Correct Site, Correct Laterality,  Correct Procedure, Correct Position, site marked,  Risks and benefits discussed,  Surgical consent,  Pre-op evaluation,  At surgeon's request and post-op pain management ? ?Laterality: Left and Lower ? ?Prep: chloraprep     ?  ?Needles:  ?Injection technique: Single-shot ? ?  ? ? ?Needle Length: 9cm  ?Needle Gauge: 22  ? ? ? ?Additional Needles: ?Arrow? StimuQuik? ECHO Echogenic Stimulating PNB Needle ? ?Procedures:,,,, ultrasound used (permanent image in chart),,    ?Narrative:  ?Start time: 07/17/2021 10:34 AM ?End time: 07/17/2021 10:41 AM ?Injection made incrementally with aspirations every 5 mL. ? ?Performed by: Personally  ?Anesthesiologist: Oleta Mouse, MD ? ? ? ? ?

## 2021-07-17 NOTE — Anesthesia Preprocedure Evaluation (Addendum)
Anesthesia Evaluation  ?Patient identified by MRN, date of birth, ID band ?Patient awake ? ? ? ?Reviewed: ?Allergy & Precautions, NPO status , Patient's Chart, lab work & pertinent test results ? ?History of Anesthesia Complications ?Negative for: history of anesthetic complications ? ?Airway ?Mallampati: IV ? ?TM Distance: >3 FB ?Neck ROM: Full ? ? ? Dental ? ?(+) Dental Advisory Given ?  ?Pulmonary ?shortness of breath, sleep apnea , COPD,  COPD inhaler, Current Smoker and Patient abstained from smoking.,  ?  ?breath sounds clear to auscultation ? ? ? ? ? ? Cardiovascular ?hypertension, Pt. on medications and Pt. on home beta blockers ?+ angina + CAD, + Past MI and +CHF  ? ?Rhythm:Regular  ?1. The left ventricle has mildly reduced systolic function, with an  ?ejection fraction of 45-50%. The cavity size was normal. There is severely  ?increased left ventricular wall thickness. Left ventricular diastolic  ?Doppler parameters are consistent with  ?pseudonormalization Left ventricular diffuse hypokinesis. Appearance of  ?the left ventricle is concerning for poorly controlled hypertension versus  ?cardiac amyloidosis.  ??2. The right ventricle has normal systolic function. The cavity was  ?normal. There is no increase in right ventricular wall thickness.  ??3. Left atrial size was moderately dilated.  ??4. Right atrial size was mildly dilated.  ??5. The mitral valve is normal in structure. There is mild mitral annular  ?calcification present. No evidence of mitral valve stenosis. No  ?regurgitation.  ??6. The tricuspid valve is normal in structure.  ??7. The aortic valve is tricuspid Mild calcification of the aortic valve.  ?no stenosis of the aortic valve.  ??8. The pulmonic valve was normal in structure.  ??9. There is mild dilatation of the ascending aorta measuring 41 mm.  ?10. No complete TR doppler jet so unable to estimate PA systolic pressure.  ?Normal IVC.  ?   ?Neuro/Psych ?PSYCHIATRIC DISORDERS Anxiety  Neuromuscular disease CVA   ? GI/Hepatic ?negative GI ROS, Neg liver ROS,   ?Endo/Other  ?diabetes, Poorly Controlled, Type 2, Insulin DependentLab Results ?     Component                Value               Date                 ?     HGBA1C                   10.5 (A)            06/14/2021           ? ? Renal/GU ?ESRF and DialysisRenal diseaseLast HD 07/16/21 ? ?Lab Results ?     Component                Value               Date                 ?     CREATININE               4.60 (H)            07/17/2021           ?Lab Results ?     Component                Value               Date                 ?  K                        4.2                 07/17/2021           ?  ? ?  ?Musculoskeletal ? ? Abdominal ?  ?Peds ? Hematology ? ?(+) Blood dyscrasia, anemia , Lab Results ?     Component                Value               Date                 ?     WBC                      7.6                 07/16/2021           ?     HGB                      10.9 (L)            07/17/2021           ?     HCT                      32.0 (L)            07/17/2021           ?     MCV                      82.2                07/16/2021           ?     PLT                      255                 07/16/2021           ?   ?Anesthesia Other Findings ?? ?Hx of hypertensive heart disease, NICM (severe LVH, EF 45-50% by echo 06/2019), CVA x 3 (last reportedly 2020) recurrent chest pain (evaluated on numerous occasions), nonobstructive CAD by cath. Pt has had numerous ED evaluations for chest pain. Cath 02/2016 showed mild, nonobstructive CAD. ?? ?Pt seen at [facility] ED 07/16/21 for eval of chest pain. Workup benign. EKG without acute changes. Troponin mildly elevated in setting of ESRD but flat on recheck 53>>49. Remainder of workup unremarkable. Pt was discharged for outpatient followup. ?? ?ESRD on HD T Th Sa via right IJ TDC. ?? ?OSA on CPAP. ?? ?IDDM2 followed by endocrinology. Last A1c 10.5 on  06/14/21. ?? ?COPD maintained on stiolto and prn albuterol. ?? ?Hx of cocaine abuse. Last documentation found in care everywhere in ED note 12/14/19 states "last use about 2-3 months ago." ? Reproductive/Obstetrics ? ?  ? ? ? ? ? ? ? ? ? ? ? ? ? ?  ?  ? ? ? ? ? ? ? ?Anesthesia Physical ?Anesthesia Plan ? ?ASA: 4 ? ?Anesthesia Plan: MAC and Regional  ? ?Post-op Pain Management: Regional block*  ? ?Induction:  ? ?PONV Risk Score and Plan:  0 and Treatment may vary due to age or medical condition ? ?Airway Management Planned: Nasal CPAP ? ?Additional Equipment: None ? ?Intra-op Plan:  ? ?Post-operative Plan:  ? ?Informed Consent: I have reviewed the patients History and Physical, chart, labs and discussed the procedure including the risks, benefits and alternatives for the proposed anesthesia with the patient or authorized representative who has indicated his/her understanding and acceptance.  ? ? ? ?Dental advisory given ? ?Plan Discussed with: CRNA and Anesthesiologist ? ?Anesthesia Plan Comments:   ? ? ? ? ? ?Anesthesia Quick Evaluation ? ?

## 2021-07-17 NOTE — Transfer of Care (Signed)
Immediate Anesthesia Transfer of Care Note ? ?Patient: TREYTEN MONESTIME ? ?Procedure(s) Performed: CREATION  OF LEFT ARM RADIOCEPHALIC ARTERIOVENOUS (AV) FISTULA (Left: Arm Lower) ? ?Patient Location: PACU ? ?Anesthesia Type:General ? ?Level of Consciousness: awake, alert , oriented and patient cooperative ? ?Airway & Oxygen Therapy: Patient Spontanous Breathing and Patient connected to nasal cannula oxygen ? ?Post-op Assessment: Report given to RN, Post -op Vital signs reviewed and stable and Patient moving all extremities X 4 ? ?Post vital signs: Reviewed and stable ? ?Last Vitals:  ?Vitals Value Taken Time  ?BP 151/66 07/17/21 1218  ?Temp    ?Pulse 63 07/17/21 1223  ?Resp 12 07/17/21 1223  ?SpO2 98 % 07/17/21 1223  ?Vitals shown include unvalidated device data. ? ?Last Pain:  ?Vitals:  ? 07/17/21 0840  ?TempSrc:   ?PainSc: 0-No pain  ?   ? ?  ? ?Complications: No notable events documented. ?

## 2021-07-17 NOTE — Progress Notes (Signed)
Anesthesia Chart Review: ?Same day workup ? ?Hx of hypertensive heart disease, NICM (severe LVH, EF 45-50% by echo 06/2019), CVA x 3 (last reportedly 2020) recurrent chest pain (evaluated on numerous occasions), nonobstructive CAD by cath. Pt has had numerous ED evaluations for chest pain. Cath 02/2016 showed mild, nonobstructive CAD. ? ?Pt seen at [facility] ED 07/16/21 for eval of chest pain. Workup benign. EKG without acute changes. Troponin mildly elevated in setting of ESRD but flat on recheck 53>>49. Remainder of workup unremarkable. Pt was discharged for outpatient followup. ? ?ESRD on HD T Th Sa via right IJ TDC. ? ?OSA on CPAP. ? ?IDDM2 followed by endocrinology. Last A1c 10.5 on 06/14/21. ? ?COPD maintained on stiolto and prn albuterol. ? ?Hx of cocaine abuse. Last documentation found in care everywhere in ED note 12/14/19 states "last use about 2-3 months ago." ? ?Pt will need DOS eval. ? ?EKG 07/16/21: Sinus tach. Rate 105. ? ?CHEST - 2 VIEW 07/16/21: ?COMPARISON:  Chest x-ray 11/28/2019. ?  ?FINDINGS: ?Right chest port catheter tip projects over the distal SVC. The ?heart is enlarged, unchanged. The lungs are clear. There is no ?pleural effusion or pneumothorax. No acute fractures are seen. ?  ?IMPRESSION: ?1. Stable cardiomegaly. ?2. No acute cardiopulmonary process. ? ?Limited echo 06/12/19 (care everywhere): ?Left Ventricle: Mildly dilated left ventricle. There is severe  ?concentric hypertrophy (IVSd and LVPWd both 4mm). Systolic function is  ?mildly abnormal. EF: 45-50%. Global hypokinesis.  ?  Right Ventricle: Grossly normal RV size and systolic function.  ?  Left Atrium: Left atrium cavity is moderately dilated.  ?  Aorta: Borderline dilated aortic root (33mm).  ?  Pericardium: There is a trivial pericardial effusion noted.  ? ?Cath 02/15/16 (care everywhere): ?ASSESSMENT:  ?1.  Mild nonobstructive CAD  ?2.  Mildly decreased LV function and elevated LVEDP  ?3.  False positive stress test  ?4.  Severe  difficult to control hypertension  ? ?PLAN:    ?1.  Medical therapy for severe hypertension ? ? ?Karoline Caldwell, PA-C ?[facility]/Anesthesiology ?Phone [phone] ?07/17/2021 8:01 AM ? ?

## 2021-07-17 NOTE — Op Note (Signed)
? ? ?  Patient name: Matthew Odom MRN: [id number] DOB: 1970-05-20 Sex: male ? ?07/17/2021 ?Pre-operative Diagnosis: ESRD ?Post-operative diagnosis:  Same ?Surgeon:  Annamarie Major ?Assistants:  Ivin Booty ?Procedure:   Left radiocephalic fistula ?Anesthesia: Regional ?Blood Loss: Minimal ?Specimens: None ? ?Findings: The radial artery was disease-free and 3 mm.  The cephalic vein was 3 to 4 mm. ? ?Indications: This is a 51 year old gentleman in need of permanent access.  He is right-handed.  He comes in today for fistula creation. ? ?Procedure:  The patient was identified in the holding area and taken to [place] [facility]  The patient was then placed supine on the table. regional anesthesia was administered.  The patient was prepped and draped in the usual sterile fashion.  A time out was called and antibiotics were administered.  A PA was necessary to expedite the procedure and assist with technical details.  Specifically, the PA assisted with retraction to help with exposure, suctioning, and suture following for the anastomosis as well as skin closure. ? ?Ultrasound was used to evaluate the cephalic vein in the upper arm.  I felt like it was a reasonable sized vein throughout the arm.  I then evaluated the radial artery which was not calcified and 3 mm.  Therefore elected to proceed with a radiocephalic fistula.  A transverse incision was made 2 fingerbreadths proximal to the wrist.  I first dissected out the radial artery which was a disease-free 3 mm vessel with a good pulse.  It was encircled with Vesseloops.  Then dissected out the cephalic vein.  This was a healthy appearing vein measuring 3 mm.  There were multiple side branches which were ligated with silk ties.  Ultimately the vein was marked for orientation and ligated distally.  It distended nicely with heparin saline.  Next, the radial artery was occluded with vascular clamps.  #11 blade was used to make an arteriotomy which was extended longitudinally with Potts  scissors.  The vein was cut to the appropriate length and spatulated to fit the size of the arteriotomy.  A running anastomosis was created with 6-0 Prolene.  Prior to completion the appropriate flushing maneuvers were performed and the anastomosis was completed.  The clamps were released.  There was an excellent thrill from the fistula.  There is a palpable radial pulse distal to the anastomosis.  Inspected the course of the cephalic vein to make sure there were no kinks or residual branches.  Hemostasis was then achieved.  The wound was irrigated.  The deep tissue was reapproximated 3-0 Vicryl.  The skin was closed with 4-0 Vicryl followed by Dermabond.  There are no immediate complications. ? ? ?Disposition: To PACU stable. ? ? ?V. Annamarie Major, M.D., FACS ?[facility] ?Office: [phone] ?Pager:  [phone]  ?

## 2021-07-17 NOTE — Discharge Instructions (Signed)
? ?[facility] ? ?Discharge Instructions ? ?AV Fistula or Graft Surgery for Dialysis Access ? ?Please refer to the following instructions for your post-procedure care. Your surgeon or physician assistant will discuss any changes with you. ? ?Activity ? ?You may drive the day following your surgery, if you are comfortable and no longer taking prescription pain medication. Resume full activity as the soreness in your incision resolves. ? ?Bathing/Showering ? ?You may shower after you go home. Keep your incision dry for 48 hours. Do not soak in a bathtub, hot tub, or swim until the incision heals completely. You may not shower if you have a hemodialysis catheter. ? ?Incision Care ? ?Clean your incision with mild soap and water after 48 hours. Pat the area dry with a clean towel. You do not need a bandage unless otherwise instructed. Do not apply any ointments or creams to your incision. You may have skin glue on your incision. Do not peel it off. It will come off on its own in about one week. Your arm may swell a bit after surgery. To reduce swelling use pillows to elevate your arm so it is above your heart. Your doctor will tell you if you need to lightly wrap your arm with an ACE bandage. ? ?Diet ? ?Resume your normal diet. There are not special food restrictions following this procedure. In order to heal from your surgery, it is CRITICAL to get adequate nutrition. Your body requires vitamins, minerals, and protein. Vegetables are the best source of vitamins and minerals. Vegetables also provide the perfect balance of protein. Processed food has little nutritional value, so try to avoid this. ? ?Medications ? ?Resume taking all of your medications. If your incision is causing pain, you may take over-the counter pain relievers such as acetaminophen (Tylenol). If you were prescribed a stronger pain medication, please be aware these medications can cause nausea and constipation. Prevent  nausea by taking the medication with a snack or meal. Avoid constipation by drinking plenty of fluids and eating foods with high amount of fiber, such as fruits, vegetables, and grains.  ?Do not take Tylenol if you are taking prescription pain medications. ? ?Follow up ?Your surgeon may want to see you in the office following your access surgery. If so, this will be arranged at the time of your surgery. ? ?Please call us immediately for any of the following conditions: ? ?Increased pain, redness, drainage (pus) from your incision site ?Fever of 101 degrees or higher ?Severe or worsening pain at your incision site ?Hand pain or numbness. ? ?Reduce your risk of vascular disease: ? ?Stop smoking. If you would like help, call [facility] at 1-800-QUIT-NOW 763-233-6576) or [place] at [phone] ? ?Manage your cholesterol ?Maintain a desired weight ?Control your diabetes ?Keep your blood pressure down ? ?Dialysis ? ?It will take several weeks to several months for your new dialysis access to be ready for use. Your surgeon will determine when it is okay to use it. Your nephrologist will continue to direct your dialysis. You can continue to use your Permcath until your new access is ready for use. ? ? ?07/17/2021 ?Matthew Odom ?[id number] ?1971-03-10 ? ?Surgeon(s): ?Serafina Mitchell, MD ? ?Procedure(s): ?CREATION  OF LEFT ARM RADIOCEPHALIC ARTERIOVENOUS (AV) FISTULA ? ? May stick graft immediately  ? May stick graft on designated area only:   ?X Do not stick left AV fistula for 12 weeks  ? ? ?If you have any questions, please  call the office at [phone]. ?

## 2021-07-18 NOTE — Anesthesia Postprocedure Evaluation (Signed)
Anesthesia Post Note ? ?Patient: Matthew Odom ? ?Procedure(s) Performed: CREATION  OF LEFT ARM RADIOCEPHALIC ARTERIOVENOUS (AV) FISTULA (Left: Arm Lower) ? ?  ? ?Patient location during evaluation: PACU ?Anesthesia Type: Regional and MAC ?Level of consciousness: awake and alert ?Pain management: pain level controlled ?Vital Signs Assessment: post-procedure vital signs reviewed and stable ?Respiratory status: spontaneous breathing, nonlabored ventilation, respiratory function stable and patient connected to nasal cannula oxygen ?Cardiovascular status: stable and blood pressure returned to baseline ?Postop Assessment: no apparent nausea or vomiting ?Anesthetic complications: no ? ? ?No notable events documented. ? ?Last Vitals:  ?Vitals:  ? 07/17/21 1335 07/17/21 1350  ?BP: 131/87 132/89  ?Pulse: 61 66  ?Resp: 18 15  ?Temp:  (!) 36.4 ?C  ?SpO2: 95% 96%  ?  ?Last Pain:  ?Vitals:  ? 07/17/21 1350  ?TempSrc:   ?PainSc: 0-No pain  ? ? ?  ?  ?  ?  ?  ?  ? ?Alfredo Spong ? ? ? ? ?

## 2021-07-19 ENCOUNTER — Encounter (HOSPITAL_COMMUNITY): Payer: Self-pay | Admitting: Surgery

## 2021-07-23 ENCOUNTER — Telehealth: Payer: Self-pay | Admitting: Internal Medicine

## 2021-07-23 ENCOUNTER — Other Ambulatory Visit: Payer: Self-pay | Admitting: Internal Medicine

## 2021-07-23 ENCOUNTER — Other Ambulatory Visit: Payer: Self-pay

## 2021-07-23 MED ORDER — INSULIN LISPRO (1 UNIT DIAL) 100 UNIT/ML (KWIKPEN)
PEN_INJECTOR | SUBCUTANEOUS | 4 refills | Status: DC
  Filled 2021-07-23: qty 9, 30d supply, fill #0
  Filled 2021-07-24: qty 15, 50d supply, fill #0

## 2021-07-24 ENCOUNTER — Other Ambulatory Visit: Payer: Self-pay

## 2021-07-24 NOTE — Telephone Encounter (Signed)
Called Helene Kelp back but she did not answer. Left a message for the pharmacy.  ?

## 2021-07-24 NOTE — Telephone Encounter (Signed)
[facility] calling back to discuss patient, call back number is [phone]. ?

## 2021-07-24 NOTE — Telephone Encounter (Signed)
Called pharmacy again and had to leave another message.  ?

## 2021-07-25 ENCOUNTER — Other Ambulatory Visit: Payer: Self-pay

## 2021-07-26 ENCOUNTER — Other Ambulatory Visit: Payer: Self-pay

## 2021-07-29 ENCOUNTER — Other Ambulatory Visit: Payer: Self-pay

## 2021-07-30 ENCOUNTER — Other Ambulatory Visit: Payer: Self-pay

## 2021-07-30 MED ORDER — DOXYCYCLINE HYCLATE 100 MG PO TABS
100.0000 mg | ORAL_TABLET | ORAL | 0 refills | Status: DC
  Filled 2021-07-30 (×2): qty 14, 7d supply, fill #0

## 2021-07-31 ENCOUNTER — Other Ambulatory Visit: Payer: Self-pay

## 2021-08-02 ENCOUNTER — Emergency Department (HOSPITAL_COMMUNITY): Payer: Medicare Other

## 2021-08-02 ENCOUNTER — Other Ambulatory Visit: Payer: Self-pay

## 2021-08-02 ENCOUNTER — Emergency Department (HOSPITAL_COMMUNITY)
Admission: EM | Admit: 2021-08-02 | Discharge: 2021-08-03 | Disposition: A | Discharge status: Home / Self Care | Payer: Medicare Other | Attending: Emergency Medicine | Admitting: Emergency Medicine

## 2021-08-02 DIAGNOSIS — J449 Chronic obstructive pulmonary disease, unspecified: Secondary | ICD-10-CM | POA: Insufficient documentation

## 2021-08-02 DIAGNOSIS — N186 End stage renal disease: Secondary | ICD-10-CM | POA: Diagnosis not present

## 2021-08-02 DIAGNOSIS — I2510 Atherosclerotic heart disease of native coronary artery without angina pectoris: Secondary | ICD-10-CM | POA: Diagnosis not present

## 2021-08-02 DIAGNOSIS — E119 Type 2 diabetes mellitus without complications: Secondary | ICD-10-CM | POA: Insufficient documentation

## 2021-08-02 DIAGNOSIS — Y903 Blood alcohol level of 60-79 mg/100 ml: Secondary | ICD-10-CM | POA: Diagnosis not present

## 2021-08-02 DIAGNOSIS — I712 Thoracic aortic aneurysm, without rupture, unspecified: Secondary | ICD-10-CM | POA: Diagnosis not present

## 2021-08-02 DIAGNOSIS — Z79899 Other long term (current) drug therapy: Secondary | ICD-10-CM | POA: Insufficient documentation

## 2021-08-02 DIAGNOSIS — F17210 Nicotine dependence, cigarettes, uncomplicated: Secondary | ICD-10-CM | POA: Insufficient documentation

## 2021-08-02 DIAGNOSIS — R55 Syncope and collapse: Secondary | ICD-10-CM | POA: Diagnosis not present

## 2021-08-02 DIAGNOSIS — I120 Hypertensive chronic kidney disease with stage 5 chronic kidney disease or end stage renal disease: Secondary | ICD-10-CM | POA: Insufficient documentation

## 2021-08-02 LAB — CBC
HCT: 32.7 % — ABNORMAL LOW (ref 39.0–52.0)
Hemoglobin: 10.5 g/dL — ABNORMAL LOW (ref 13.0–17.0)
MCH: 27.8 pg (ref 26.0–34.0)
MCHC: 32.1 g/dL (ref 30.0–36.0)
MCV: 86.5 fL (ref 80.0–100.0)
Platelets: 239 10*3/uL (ref 150–400)
RBC: 3.78 MIL/uL — ABNORMAL LOW (ref 4.22–5.81)
RDW: 16.1 % — ABNORMAL HIGH (ref 11.5–15.5)
WBC: 9.1 10*3/uL (ref 4.0–10.5)
nRBC: 0 % (ref 0.0–0.2)

## 2021-08-02 LAB — MAGNESIUM: Magnesium: 1.6 mg/dL — ABNORMAL LOW (ref 1.7–2.4)

## 2021-08-02 LAB — BASIC METABOLIC PANEL
Anion gap: 11 (ref 5–15)
BUN: 33 mg/dL — ABNORMAL HIGH (ref 6–20)
CO2: 22 mmol/L (ref 22–32)
Calcium: 8.2 mg/dL — ABNORMAL LOW (ref 8.9–10.3)
Chloride: 104 mmol/L (ref 98–111)
Creatinine, Ser: 4.36 mg/dL — ABNORMAL HIGH (ref 0.61–1.24)
GFR, Estimated: 16 mL/min — ABNORMAL LOW (ref >60)
Glucose, Bld: 193 mg/dL — ABNORMAL HIGH (ref 70–99)
Potassium: 3.4 mmol/L — ABNORMAL LOW (ref 3.5–5.1)
Sodium: 137 mmol/L (ref 135–145)

## 2021-08-02 LAB — CBG MONITORING, ED: Glucose-Capillary: 203 mg/dL — ABNORMAL HIGH (ref 70–99)

## 2021-08-02 LAB — TROPONIN I (HIGH SENSITIVITY): Troponin I (High Sensitivity): 66 ng/L — ABNORMAL HIGH (ref <18)

## 2021-08-02 NOTE — ED Triage Notes (Signed)
Pt BIB EMS from home. EMS reports pt had syncope event while playing cards with family. When EMS arrived pt was diaphoretic and pale. EKG suspect for STEMI - called in the field. EMS gave 324 asa and 2 nitro. Pt is reporting no CP.  ?Code stemi deactivated here  ?Pt has hx of kidney failure on dialysis - fistula placed on left arm, COPD, HTN, diabetes - pt has not taken BP meds tonight  ?Pt AO  ?18 RAC  ?

## 2021-08-02 NOTE — ED Notes (Signed)
Lab to run ethanol at this time  ?

## 2021-08-02 NOTE — ED Notes (Addendum)
CODE STEMI DEACTIVATED BY CARDS AT THIS TIME  ?

## 2021-08-02 NOTE — ED Notes (Signed)
EDP at bedside  

## 2021-08-02 NOTE — ED Provider Notes (Signed)
?[facility] DEPT ?[facility] Emergency Department ?Provider Note ?MRN:  [id number]  ?Arrival date & time: 08/03/21    ? ?Chief Complaint   ?LOC ?History of Present Illness   ?DAWAYNE OHAIR is a 51 y.o. year-old male with a history of CAD, ESRD presenting to the ED with chief complaint of LOC. ? ?Patient was playing cards with his family and suddenly his eyes rolled back in his head and he slumped backwards in his chair and was unresponsive.  Lasted a few minutes and then he woke up.  Quick return to baseline per report.  Denies any chest pain or shortness of breath before or after the event. ? ?Review of Systems  ?A thorough review of systems was obtained and all systems are negative except as noted in the HPI and PMH.  ? ?Patient's Health History   ? ?Past Medical History:  ?Diagnosis Date  ? Anxiety   ? Cocaine use   ? 07/15/21- "last time was more than 10 years ago."  ? Complication of anesthesia   ? wakes up during surgery  ? COPD (chronic obstructive pulmonary disease) ([place])   ? Coronary artery disease   ? mild non obstructed  ? Diabetes mellitus without complication ([place])   ? Dyspnea   ? Enlarged heart   ? ESRD (end stage renal disease) ([place])   ? ESRD (end stage renal disease) ([place])   ? TTHSAT  ? History of degenerative disc disease   ? Hyperlipidemia   ? Hypertension   ? NSTEMI (non-ST elevated myocardial infarction) ([place])   ? Pneumonia   ? Sciatic nerve pain   ? Stroke [facility])   ? no residual, x 3 last one was in 2020  ?  ?Past Surgical History:  ?Procedure Laterality Date  ? AV FISTULA PLACEMENT Left 07/17/2021  ? Procedure: CREATION  OF LEFT ARM RADIOCEPHALIC ARTERIOVENOUS (AV) FISTULA;  Surgeon: Serafina Mitchell, MD;  Location: [place];  Service: Vascular;  Laterality: Left;  ? CARDIAC CATHETERIZATION    ? IR FLUORO GUIDE CV LINE RIGHT  05/20/2021  ? IR US GUIDE VASC ACCESS RIGHT  05/20/2021  ? NO PAST SURGERIES    ?  ?Family History  ?Problem Relation Age of Onset  ? Heart attack Mother   ?  Hypertension Mother   ? Diabetes Mother   ? Heart disease Sister   ? Hypertension Sister   ? Hypertension Father   ? Emphysema Father   ?  ?Social History  ? ?Socioeconomic History  ? Marital status: Married  ?  Spouse name: Not on file  ? Number of children: Not on file  ? Years of education: Not on file  ? Highest education level: Not on file  ?Occupational History  ? Not on file  ?Tobacco Use  ? Smoking status: Every Day  ?  Packs/day: 0.50  ?  Years: 40.00  ?  Pack years: 20.00  ?  Types: Cigarettes  ?  Last attempt to quit: 05/21/2021  ?  Years since quitting: 0.2  ? Smokeless tobacco: Never  ? Tobacco comments:  ?  Stopped smoking 05/21/21.   ?Vaping Use  ? Vaping Use: Never used  ?Substance and Sexual Activity  ? Alcohol use: Not Currently  ? Drug use: Not Currently  ?  Types: Cocaine  ? Sexual activity: Yes  ?Other Topics Concern  ? Not on file  ?Social History Narrative  ? Not on file  ? ?Social Determinants of Health  ? ?Emergency planning/management officer  Strain: Not on file  ?Food Insecurity: Not on file  ?Transportation Needs: Not on file  ?Physical Activity: Not on file  ?Stress: Not on file  ?Social Connections: Not on file  ?Intimate Partner Violence: Not on file  ?  ? ?Physical Exam  ? ?Vitals:  ? 08/03/21 0300 08/03/21 0315  ?BP: (!) 169/112   ?Pulse: 86 80  ?Resp: (!) 22 15  ?Temp:    ?SpO2: 98% 100%  ?  ?CONSTITUTIONAL: Well-appearing, NAD ?NEURO/PSYCH:  Alert and oriented x 3, normal and symmetric strength and sensation, normal coordination, normal speech ?EYES:  eyes equal and reactive ?ENT/NECK:  no LAD, no JVD ?CARDIO: Regular rate, well-perfused, normal S1 and S2 ?PULM:  CTAB no wheezing or rhonchi ?GI/GU:  non-distended, non-tender ?MSK/SPINE:  No gross deformities, no edema ?SKIN:  no rash, atraumatic ? ? ?*Additional and/or pertinent findings included in MDM below ? ?Diagnostic and Interventional Summary  ? ? EKG Interpretation ? ?Date/Time:  Friday August 02 2021 22:54:29 EDT ?Ventricular Rate:  98 ?PR  Interval:  178 ?QRS Duration: 130 ?QT Interval:  367 ?QTC Calculation: 469 ?R Axis:   55 ?Text Interpretation: Sinus rhythm Probable left ventricular hypertrophy Nonspecific T abnormalities, lateral leads ST elevation, consider anterior injury Confirmed by Gerlene Fee [id number]) on 08/02/2021 11:31:41 PM ?  ? ?  ? ?Labs Reviewed  ?CBC - Abnormal; Notable for the following components:  ?    Result Value  ? RBC 3.78 (*)   ? Hemoglobin 10.5 (*)   ? HCT 32.7 (*)   ? RDW 16.1 (*)   ? All other components within normal limits  ?BASIC METABOLIC PANEL - Abnormal; Notable for the following components:  ? Potassium 3.4 (*)   ? Glucose, Bld 193 (*)   ? BUN 33 (*)   ? Creatinine, Ser 4.36 (*)   ? Calcium 8.2 (*)   ? GFR, Estimated 16 (*)   ? All other components within normal limits  ?MAGNESIUM - Abnormal; Notable for the following components:  ? Magnesium 1.6 (*)   ? All other components within normal limits  ?ETHANOL - Abnormal; Notable for the following components:  ? Alcohol, Ethyl (B) 68 (*)   ? All other components within normal limits  ?CBG MONITORING, ED - Abnormal; Notable for the following components:  ? Glucose-Capillary 203 (*)   ? All other components within normal limits  ?TROPONIN I (HIGH SENSITIVITY) - Abnormal; Notable for the following components:  ? Troponin I (High Sensitivity) 66 (*)   ? All other components within normal limits  ?TROPONIN I (HIGH SENSITIVITY) - Abnormal; Notable for the following components:  ? Troponin I (High Sensitivity) 66 (*)   ? All other components within normal limits  ?  ?CT Angio Chest Pulmonary Embolism (PE) W or WO Contrast  ?Final Result  ?  ?DG Chest Port 1 View  ?Final Result  ?  ?  ?Medications  ?iohexol (OMNIPAQUE) 350 MG/ML injection 75 mL (75 mLs Intravenous Contrast Given 08/03/21 0128)  ?  ? ?Procedures  /  Critical Care ?Procedures ? ?ED Course and Medical Decision Making  ?Initial Impression and Ddx ?Suspect syncope, less likely seizure.  STEMI called in the field by  EMS, left bundle branch pattern not meeting criteria, canceled by cardiology.  Patient never had any chest pain.  Differential diagnosis includes cardiac arrhythmia, electrolyte disturbance, also considering PE given his recent surgery for AV fistula.  Awaiting labs, CTA chest. ? ?Past medical/surgical history that increases complexity  of ED encounter: ESRD ? ?Interpretation of Diagnostics ?I personally reviewed the EKG and my interpretation is as follows: Sinus rhythm, left bundle branch block ?   ?Labs reveal no significant blood count or electrolyte disturbance.  Elevated creatinine anticipated given patient's ESRD.  Troponin is minimally elevated and flat upon repeat, with no chest pain favoring more related to the ESRD than heart strain or damage. ? ?Patient Reassessment and Ultimate Disposition/Management ?Patient continues to feel well with no symptoms, he wants to go home.  Admission offered/suggested for telemetry.  Declined by patient, will follow closely, strict return precautions. ? ?Patient management required discussion with the following services or consulting groups:  None ? ?Complexity of Problems Addressed ?Acute illness or injury that poses threat of life of bodily function ? ?Additional Data Reviewed and Analyzed ?Further history obtained from: ?Further history from spouse/family member ? ?Additional Factors Impacting ED Encounter Risk ?Consideration of hospitalization ? ?Barth Kirks. Sedonia Small, MD ?[facility] Emergency Medicine ?[place] ?[email or web address] ? ?Final Clinical Impressions(s) / ED Diagnoses  ? ?  ICD-10-CM   ?1. Syncope, unspecified syncope type  R55   ?  ?  ?ED Discharge Orders   ? ? None  ? ?  ?  ? ?Discharge Instructions Discussed with and Provided to Patient:  ? ? ? ?Discharge Instructions   ? ?  ?You were evaluated in the Emergency Department and after careful evaluation, we did not find any emergent condition requiring admission or further testing in the  hospital. ? ?Your exam/testing today is overall reassuring.  Recommend close follow-up with your regular doctors to discuss your symptoms.  We found a thoracic aortic aneurysm on your CT scan.  You will likely need repeat tes

## 2021-08-03 ENCOUNTER — Emergency Department (HOSPITAL_COMMUNITY): Payer: Medicare Other

## 2021-08-03 DIAGNOSIS — I120 Hypertensive chronic kidney disease with stage 5 chronic kidney disease or end stage renal disease: Secondary | ICD-10-CM | POA: Diagnosis not present

## 2021-08-03 DIAGNOSIS — Z992 Dependence on renal dialysis: Secondary | ICD-10-CM | POA: Diagnosis not present

## 2021-08-03 DIAGNOSIS — I2510 Atherosclerotic heart disease of native coronary artery without angina pectoris: Secondary | ICD-10-CM | POA: Diagnosis not present

## 2021-08-03 DIAGNOSIS — Y903 Blood alcohol level of 60-79 mg/100 ml: Secondary | ICD-10-CM | POA: Diagnosis not present

## 2021-08-03 DIAGNOSIS — J449 Chronic obstructive pulmonary disease, unspecified: Secondary | ICD-10-CM | POA: Diagnosis not present

## 2021-08-03 DIAGNOSIS — R55 Syncope and collapse: Secondary | ICD-10-CM | POA: Diagnosis not present

## 2021-08-03 DIAGNOSIS — I712 Thoracic aortic aneurysm, without rupture, unspecified: Secondary | ICD-10-CM | POA: Diagnosis not present

## 2021-08-03 DIAGNOSIS — E1129 Type 2 diabetes mellitus with other diabetic kidney complication: Secondary | ICD-10-CM | POA: Diagnosis not present

## 2021-08-03 DIAGNOSIS — E119 Type 2 diabetes mellitus without complications: Secondary | ICD-10-CM | POA: Diagnosis not present

## 2021-08-03 DIAGNOSIS — Z79899 Other long term (current) drug therapy: Secondary | ICD-10-CM | POA: Diagnosis not present

## 2021-08-03 DIAGNOSIS — N186 End stage renal disease: Secondary | ICD-10-CM | POA: Diagnosis not present

## 2021-08-03 DIAGNOSIS — F17210 Nicotine dependence, cigarettes, uncomplicated: Secondary | ICD-10-CM | POA: Diagnosis not present

## 2021-08-03 LAB — TROPONIN I (HIGH SENSITIVITY): Troponin I (High Sensitivity): 66 ng/L — ABNORMAL HIGH (ref <18)

## 2021-08-03 LAB — ETHANOL: Alcohol, Ethyl (B): 68 mg/dL — ABNORMAL HIGH (ref <10)

## 2021-08-03 MED ORDER — IOHEXOL 350 MG/ML SOLN
75.0000 mL | Freq: Once | INTRAVENOUS | Status: AC | PRN
  Administered 2021-08-03: 75 mL via INTRAVENOUS

## 2021-08-03 NOTE — ED Notes (Signed)
Pt to ct 

## 2021-08-03 NOTE — ED Notes (Signed)
Pt is reporting no CP at this time  ?

## 2021-08-03 NOTE — Discharge Instructions (Signed)
You were evaluated in the Emergency Department and after careful evaluation, we did not find any emergent condition requiring admission or further testing in the hospital. ? ?Your exam/testing today is overall reassuring.  Recommend close follow-up with your regular doctors to discuss your symptoms.  We found a thoracic aortic aneurysm on your CT scan.  You will likely need repeat testing every year to make sure it does not get bigger.  Please discuss this with your primary care doctor. ? ?Please return to the Emergency Department if you experience any worsening of your condition.   Thank you for allowing [place] to be a part of your care. ?

## 2021-08-03 NOTE — ED Notes (Signed)
Patient verbalizes understanding of discharge instructions. Opportunity for questioning and answers were provided. Armband removed by staff, pt discharged from ED ambulatory.   

## 2021-08-05 ENCOUNTER — Telehealth: Payer: Self-pay

## 2021-08-05 NOTE — Telephone Encounter (Signed)
Transition Care Management Follow-up Telephone Call ?Date of discharge and from where: 08/03/2021 from [facility] ?How have you been since you were released from the hospital? Patient stated that he feels better. Patient and spouse did not have any questions or concerns.  ?Any questions or concerns? No ? ?Items Reviewed: ?Did the pt receive and understand the discharge instructions provided? Yes  ?Medications obtained and verified? Yes  ?Other? No  ?Any new allergies since your discharge? No  ?Dietary orders reviewed? No ?Do you have support at home? Yes  ? ?Functional Questionnaire: (I = Independent and D = Dependent) ?ADLs: I ? ?Bathing/Dressing- I ? ?Meal Prep- I ? ?Eating- I ? ?Maintaining continence- I ? ?Transferring/Ambulation- I ? ?Managing Meds- I ? ? ?Follow up appointments reviewed: ? ?PCP Hospital f/u appt confirmed? No   ?Specialist Hospital f/u appt confirmed? Yes  Cardiology and Dialysis ?Are transportation arrangements needed? No  ?If their condition worsens, is the pt aware to call PCP or go to the Emergency Dept.? Yes ?Was the patient provided with contact information for the PCP's office or ED? Yes ?Was to pt encouraged to call back with questions or concerns? Yes ? ?

## 2021-08-06 DIAGNOSIS — N2581 Secondary hyperparathyroidism of renal origin: Secondary | ICD-10-CM | POA: Diagnosis not present

## 2021-08-06 DIAGNOSIS — E876 Hypokalemia: Secondary | ICD-10-CM | POA: Diagnosis not present

## 2021-08-06 DIAGNOSIS — N186 End stage renal disease: Secondary | ICD-10-CM | POA: Diagnosis not present

## 2021-08-06 DIAGNOSIS — Z992 Dependence on renal dialysis: Secondary | ICD-10-CM | POA: Diagnosis not present

## 2021-08-07 DIAGNOSIS — S86012D Strain of left Achilles tendon, subsequent encounter: Secondary | ICD-10-CM | POA: Diagnosis not present

## 2021-08-08 DIAGNOSIS — N186 End stage renal disease: Secondary | ICD-10-CM | POA: Diagnosis not present

## 2021-08-08 DIAGNOSIS — E876 Hypokalemia: Secondary | ICD-10-CM | POA: Diagnosis not present

## 2021-08-08 DIAGNOSIS — N2581 Secondary hyperparathyroidism of renal origin: Secondary | ICD-10-CM | POA: Diagnosis not present

## 2021-08-08 DIAGNOSIS — Z992 Dependence on renal dialysis: Secondary | ICD-10-CM | POA: Diagnosis not present

## 2021-08-10 DIAGNOSIS — Z992 Dependence on renal dialysis: Secondary | ICD-10-CM | POA: Diagnosis not present

## 2021-08-10 DIAGNOSIS — N2581 Secondary hyperparathyroidism of renal origin: Secondary | ICD-10-CM | POA: Diagnosis not present

## 2021-08-10 DIAGNOSIS — E876 Hypokalemia: Secondary | ICD-10-CM | POA: Diagnosis not present

## 2021-08-10 DIAGNOSIS — N186 End stage renal disease: Secondary | ICD-10-CM | POA: Diagnosis not present

## 2021-08-13 DIAGNOSIS — N186 End stage renal disease: Secondary | ICD-10-CM | POA: Diagnosis not present

## 2021-08-13 DIAGNOSIS — N2581 Secondary hyperparathyroidism of renal origin: Secondary | ICD-10-CM | POA: Diagnosis not present

## 2021-08-13 DIAGNOSIS — Z992 Dependence on renal dialysis: Secondary | ICD-10-CM | POA: Diagnosis not present

## 2021-08-13 DIAGNOSIS — E876 Hypokalemia: Secondary | ICD-10-CM | POA: Diagnosis not present

## 2021-08-13 NOTE — Telephone Encounter (Signed)
Ok to switch to [facility] ?

## 2021-08-13 NOTE — Telephone Encounter (Signed)
Dr. Shearon Stalls, please advise if you would be okay with pt switching to [facility] with the Stiolto no longer covered by pt assistance. ?

## 2021-08-14 NOTE — Telephone Encounter (Signed)
Called and left voicemail for patient this morning 08/14/2021. I wanted to give him an update of the inhaler that was ordered before it was sent in and instructions on the dosage and the frequency. Also need to verify pharmacy of patients choice.  ?

## 2021-08-17 DIAGNOSIS — Z992 Dependence on renal dialysis: Secondary | ICD-10-CM | POA: Diagnosis not present

## 2021-08-17 DIAGNOSIS — N2581 Secondary hyperparathyroidism of renal origin: Secondary | ICD-10-CM | POA: Diagnosis not present

## 2021-08-17 DIAGNOSIS — N186 End stage renal disease: Secondary | ICD-10-CM | POA: Diagnosis not present

## 2021-08-17 DIAGNOSIS — E876 Hypokalemia: Secondary | ICD-10-CM | POA: Diagnosis not present

## 2021-08-20 DIAGNOSIS — N186 End stage renal disease: Secondary | ICD-10-CM | POA: Diagnosis not present

## 2021-08-20 DIAGNOSIS — E876 Hypokalemia: Secondary | ICD-10-CM | POA: Diagnosis not present

## 2021-08-20 DIAGNOSIS — E1122 Type 2 diabetes mellitus with diabetic chronic kidney disease: Secondary | ICD-10-CM | POA: Diagnosis not present

## 2021-08-20 DIAGNOSIS — Z992 Dependence on renal dialysis: Secondary | ICD-10-CM | POA: Diagnosis not present

## 2021-08-20 DIAGNOSIS — N2581 Secondary hyperparathyroidism of renal origin: Secondary | ICD-10-CM | POA: Diagnosis not present

## 2021-08-22 ENCOUNTER — Other Ambulatory Visit: Payer: Self-pay

## 2021-08-22 DIAGNOSIS — N2581 Secondary hyperparathyroidism of renal origin: Secondary | ICD-10-CM | POA: Diagnosis not present

## 2021-08-22 DIAGNOSIS — Z992 Dependence on renal dialysis: Secondary | ICD-10-CM | POA: Diagnosis not present

## 2021-08-22 DIAGNOSIS — E876 Hypokalemia: Secondary | ICD-10-CM | POA: Diagnosis not present

## 2021-08-22 DIAGNOSIS — N186 End stage renal disease: Secondary | ICD-10-CM | POA: Diagnosis not present

## 2021-08-24 DIAGNOSIS — Z992 Dependence on renal dialysis: Secondary | ICD-10-CM | POA: Diagnosis not present

## 2021-08-24 DIAGNOSIS — N186 End stage renal disease: Secondary | ICD-10-CM | POA: Diagnosis not present

## 2021-08-24 DIAGNOSIS — N2581 Secondary hyperparathyroidism of renal origin: Secondary | ICD-10-CM | POA: Diagnosis not present

## 2021-08-24 DIAGNOSIS — E876 Hypokalemia: Secondary | ICD-10-CM | POA: Diagnosis not present

## 2021-08-26 ENCOUNTER — Other Ambulatory Visit: Payer: Self-pay

## 2021-08-27 DIAGNOSIS — E876 Hypokalemia: Secondary | ICD-10-CM | POA: Diagnosis not present

## 2021-08-27 DIAGNOSIS — N186 End stage renal disease: Secondary | ICD-10-CM | POA: Diagnosis not present

## 2021-08-27 DIAGNOSIS — N2581 Secondary hyperparathyroidism of renal origin: Secondary | ICD-10-CM | POA: Diagnosis not present

## 2021-08-27 DIAGNOSIS — Z992 Dependence on renal dialysis: Secondary | ICD-10-CM | POA: Diagnosis not present

## 2021-08-29 DIAGNOSIS — N186 End stage renal disease: Secondary | ICD-10-CM | POA: Diagnosis not present

## 2021-08-29 DIAGNOSIS — N2581 Secondary hyperparathyroidism of renal origin: Secondary | ICD-10-CM | POA: Diagnosis not present

## 2021-08-29 DIAGNOSIS — Z992 Dependence on renal dialysis: Secondary | ICD-10-CM | POA: Diagnosis not present

## 2021-08-29 DIAGNOSIS — E876 Hypokalemia: Secondary | ICD-10-CM | POA: Diagnosis not present

## 2021-08-31 DIAGNOSIS — Z992 Dependence on renal dialysis: Secondary | ICD-10-CM | POA: Diagnosis not present

## 2021-08-31 DIAGNOSIS — N186 End stage renal disease: Secondary | ICD-10-CM | POA: Diagnosis not present

## 2021-08-31 DIAGNOSIS — E876 Hypokalemia: Secondary | ICD-10-CM | POA: Diagnosis not present

## 2021-08-31 DIAGNOSIS — N2581 Secondary hyperparathyroidism of renal origin: Secondary | ICD-10-CM | POA: Diagnosis not present

## 2021-09-02 ENCOUNTER — Encounter: Payer: Self-pay | Admitting: Surgery

## 2021-09-02 ENCOUNTER — Ambulatory Visit (INDEPENDENT_AMBULATORY_CARE_PROVIDER_SITE_OTHER): Payer: Medicare Other | Admitting: Surgery

## 2021-09-02 ENCOUNTER — Ambulatory Visit (HOSPITAL_COMMUNITY)
Admission: RE | Admit: 2021-09-02 | Discharge: 2021-09-02 | Disposition: A | Discharge status: Home / Self Care | Payer: Medicare Other | Source: Ambulatory Visit | Attending: Surgery | Admitting: Surgery

## 2021-09-02 VITALS — BP 152/96 | HR 78 | Temp 98.2°F | Resp 20 | Ht 71.0 in | Wt 256.0 lb

## 2021-09-02 DIAGNOSIS — Z992 Dependence on renal dialysis: Secondary | ICD-10-CM | POA: Diagnosis not present

## 2021-09-02 DIAGNOSIS — N186 End stage renal disease: Secondary | ICD-10-CM | POA: Insufficient documentation

## 2021-09-02 DIAGNOSIS — E1129 Type 2 diabetes mellitus with other diabetic kidney complication: Secondary | ICD-10-CM | POA: Diagnosis not present

## 2021-09-02 NOTE — Progress Notes (Signed)
? ?Patient name: JORELL AGNE MRN: [id number] DOB: 1971/01/31 Sex: male ? ?REASON FOR VISIT:  ? ? ?Postop ? ?HISTORY OF PRESENT ILLNESS:  ? ?LINFORD QUINTELA is a 51 y.o. male who is status post left radiocephalic fistula on 9/48/5462.  He is on dialysis Tuesday Thursday Saturday through a right sided catheter he has no symptoms of steal ? ?CURRENT MEDICATIONS:  ? ? ?Current Outpatient Medications  ?Medication Sig Dispense Refill  ? acetaminophen (TYLENOL) 500 MG tablet Take 500-1,000 mg by mouth every 6 (six) hours as needed (pain.).    ? albuterol (PROVENTIL) (2.5 MG/3ML) 0.083% nebulizer solution Take 3 mLs (2.5 mg total) by nebulization every 6 (six) hours as needed for wheezing or shortness of breath. 150 mL 2  ? albuterol (VENTOLIN HFA) 108 (90 Base) MCG/ACT inhaler Inhale 2 puffs into the lungs every 6 (six) hours as needed. 18 g 5  ? budesonide-formoterol (SYMBICORT) 160-4.5 MCG/ACT inhaler Inhale 2 puffs into the lungs daily.    ? Calcium 500 MG tablet Take 1 tablet by mouth 3 times daily 90 tablet 2  ? calcium acetate (PHOSLO) 667 MG capsule Take 1 tablet by mouth three times a day with meals 90 capsule 3  ? calcium acetate (PHOSLO) 667 MG capsule Take 2 capsules (1,334 mg total) by mouth 3 (three) times daily with meals 540 capsule 3  ? doxycycline (VIBRA-TABS) 100 MG tablet Take 1 tablet (100 mg total) by mouth twice a day take for AVF minor infection 14 tablet 0  ? Insulin Glargine (BASAGLAR KWIKPEN) 100 UNIT/ML Inject 20 Units into the skin daily. (Patient taking differently: Inject 10 Units into the skin 2 (two) times daily. \) 30 mL 3  ? insulin lispro (HUMALOG KWIKPEN) 100 UNIT/ML KwikPen Use as directed into the skin max daily 30 units. 30 mL 4  ? Insulin Pen Needle 31G X 6 MM MISC Use as directed in the morning, at noon, in the evening, and at bedtime. 400 each 3  ? liraglutide (VICTOZA) 18 MG/3ML SOPN Inject 1.8 mg into the skin daily. 27 mL 3  ? Misc. Devices MISC  Please provide patient with insurance approved CPAP with the following settings:  autopap 15-20.  ?Large size [employer] Full Face Mask Forma mask and heated humidification. (Patient taking differently: Please provide patient with insurance approved CPAP with the following settings:  autopap 15-20.  ?Large size [employer] Full Face Mask Forma mask and heated humidification.) 1 each 0  ? nitroGLYCERIN (NITROSTAT) 0.4 MG SL tablet Place 1 tablet (0.4 mg total) under the tongue every 5 (five) minutes as needed for chest pain. 25 tablet 0  ? nitroGLYCERIN (NITROSTAT) 0.4 MG SL tablet Place 1 tablet (0.4 mg total) under the tongue every 5 (five) minutes as needed for up to 30 doses for chest pain. 30 tablet 0  ? oxyCODONE (OXY IR/ROXICODONE) 5 MG immediate release tablet Take 5 mg by mouth every 4 (four) hours as needed (pain.).    ? Tiotropium Bromide-Olodaterol (STIOLTO RESPIMAT) 2.5-2.5 MCG/ACT AERS Inhale 2 puffs into the lungs daily. 4 g 5  ? amLODipine (NORVASC) 10 MG tablet Take 1 tablet (10 mg total) by mouth daily. 30 tablet 2  ? atorvastatin (LIPITOR) 80 MG tablet Take 1 tablet (80 mg total) by mouth daily. 30 tablet 2  ? calcium carbonate (OS-CAL - DOSED IN MG OF ELEMENTAL CALCIUM) 1250 (500 Ca) MG tablet Take 1 tablet (500 mg of elemental calcium total) by mouth 2 (two) times daily with  a meal. (Patient taking differently: Take 3 tablets by mouth See admin instructions. Take 3 tablets with each meal & with each snack) 60 tablet 2  ? carvedilol (COREG) 25 MG tablet Take 1 tablet (25 mg total) by mouth 2 (two) times daily with a meal. 60 tablet 2  ? doxazosin (CARDURA) 8 MG tablet Take 1 tablet (8 mg total) by mouth daily. (Patient taking differently: Take 8 mg by mouth at bedtime.) 30 tablet 2  ? hydrALAZINE (APRESOLINE) 100 MG tablet Take 1 tablet (100 mg total) by mouth 3 (three) times daily. Take 1 tablet by mouth three times daily 90 tablet 2  ? sertraline (ZOLOFT) 25 MG tablet Take 1 tablet (25 mg  total) by mouth daily. 30 tablet 0  ? ?No current facility-administered medications for this visit.  ? ? ?REVIEW OF SYSTEMS:  ? ?X denotes positive finding, denotes negative finding ?Cardiac  Comments:  ?Chest pain or chest pressure:    ?Shortness of breath upon exertion:    ?Short of breath when lying flat:    ?Irregular heart rhythm:    ?Constitutional    ?Fever or chills:    ? ? ?PHYSICAL EXAM:  ? ?Vitals:  ? 09/02/21 1346  ?BP: (!) 152/96  ?Pulse: 78  ?Resp: 20  ?Temp: 98.2 ?F (36.8 ?C)  ?SpO2: 100%  ?Weight: 256 lb (116.1 kg)  ?Height: 5\' 11"  (1.803 m)  ? ? ?GENERAL: The patient is a well-nourished male, in no acute distress. The vital signs are documented above. ?CARDIOVASCULAR: There is a regular rate and rhythm. ?PULMONARY: Non-labored respirations ?Palpable thrill within fistula however it is difficult to feel in the upper arm.  I evaluated this with SonoSite.  The vein looks like appropriate diameter however there were multiple competing branches ? ?STUDIES:  ? ?OUTFLOW VEINPSV (cm/s)Diameter (cm)Depth (cm)           Describe        ?      ?+------------+----------+-------------+-----------+------------------------  ?----+  ?                                                                        ?      ?+------------+----------+-------------+-----------+------------------------  ?----+  ?AC Fossa       133        0.64        0.23     competing branch  ?0.33cm 75   ?                                                          cm/s          ?      ?+------------+----------+-------------+-----------+------------------------  ?----+  ?Prox Forearm   126        0.51        0.47    competing branch 0.20  ?cm 57   ?  cm/s          ?      ?+------------+----------+-------------+-----------+------------------------  ?----+  ?Mid Forearm    165        0.47        0.48     competing branch and  ?0.24,   ?                                                         121 cm/s        ?      ?+------------+----------+-------------+-----------+------------------------  ?----+  ?Dist Forearm   585     0.37 / 0.29 0.63 / 0.65      slight narrowing    ?      ?+------------+----------+-------------+-----------+------------------------  ?----+  ? ? ?MEDICAL ISSUES:  ? ?None maturing fistula: I discussed with the patient that I think the neck step is branch ligation and possibly elevation.  This is scheduled for May 12 on a Friday ? ?Annamarie [place], IV, MD, FACS ?[facility] ?Tel [phone] ?Pager [phone] ? ? ?  ?

## 2021-09-02 NOTE — H&P (View-Only) (Signed)
? ?Patient name: Matthew Odom MRN: [id number] DOB: 08-Dec-1970 Sex: male ? ?REASON FOR VISIT:  ? ? ?Postop ? ?HISTORY OF PRESENT ILLNESS:  ? ?Matthew Odom is a 51 y.o. male who is status post left radiocephalic fistula on 9/79/8921.  He is on dialysis Tuesday Thursday Saturday through a right sided catheter he has no symptoms of steal ? ?CURRENT MEDICATIONS:  ? ? ?Current Outpatient Medications  ?Medication Sig Dispense Refill  ? acetaminophen (TYLENOL) 500 MG tablet Take 500-1,000 mg by mouth every 6 (six) hours as needed (pain.).    ? albuterol (PROVENTIL) (2.5 MG/3ML) 0.083% nebulizer solution Take 3 mLs (2.5 mg total) by nebulization every 6 (six) hours as needed for wheezing or shortness of breath. 150 mL 2  ? albuterol (VENTOLIN HFA) 108 (90 Base) MCG/ACT inhaler Inhale 2 puffs into the lungs every 6 (six) hours as needed. 18 g 5  ? budesonide-formoterol (SYMBICORT) 160-4.5 MCG/ACT inhaler Inhale 2 puffs into the lungs daily.    ? Calcium 500 MG tablet Take 1 tablet by mouth 3 times daily 90 tablet 2  ? calcium acetate (PHOSLO) 667 MG capsule Take 1 tablet by mouth three times a day with meals 90 capsule 3  ? calcium acetate (PHOSLO) 667 MG capsule Take 2 capsules (1,334 mg total) by mouth 3 (three) times daily with meals 540 capsule 3  ? doxycycline (VIBRA-TABS) 100 MG tablet Take 1 tablet (100 mg total) by mouth twice a day take for AVF minor infection 14 tablet 0  ? Insulin Glargine (BASAGLAR KWIKPEN) 100 UNIT/ML Inject 20 Units into the skin daily. (Patient taking differently: Inject 10 Units into the skin 2 (two) times daily. \) 30 mL 3  ? insulin lispro (HUMALOG KWIKPEN) 100 UNIT/ML KwikPen Use as directed into the skin max daily 30 units. 30 mL 4  ? Insulin Pen Needle 31G X 6 MM MISC Use as directed in the morning, at noon, in the evening, and at bedtime. 400 each 3  ? liraglutide (VICTOZA) 18 MG/3ML SOPN Inject 1.8 mg into the skin daily. 27 mL 3  ? Misc. Devices MISC  Please provide patient with insurance approved CPAP with the following settings:  autopap 15-20.  ?Large size [employer]kel Full Face Mask Forma mask and heated humidification. (Patient taking differently: Please provide patient with insurance approved CPAP with the following settings:  autopap 15-20.  ?Large size [employer] Full Face Mask Forma mask and heated humidification.) 1 each 0  ? nitroGLYCERIN (NITROSTAT) 0.4 MG SL tablet Place 1 tablet (0.4 mg total) under the tongue every 5 (five) minutes as needed for chest pain. 25 tablet 0  ? nitroGLYCERIN (NITROSTAT) 0.4 MG SL tablet Place 1 tablet (0.4 mg total) under the tongue every 5 (five) minutes as needed for up to 30 doses for chest pain. 30 tablet 0  ? oxyCODONE (OXY IR/ROXICODONE) 5 MG immediate release tablet Take 5 mg by mouth every 4 (four) hours as needed (pain.).    ? Tiotropium Bromide-Olodaterol (STIOLTO RESPIMAT) 2.5-2.5 MCG/ACT AERS Inhale 2 puffs into the lungs daily. 4 g 5  ? amLODipine (NORVASC) 10 MG tablet Take 1 tablet (10 mg total) by mouth daily. 30 tablet 2  ? atorvastatin (LIPITOR) 80 MG tablet Take 1 tablet (80 mg total) by mouth daily. 30 tablet 2  ? calcium carbonate (OS-CAL - DOSED IN MG OF ELEMENTAL CALCIUM) 1250 (500 Ca) MG tablet Take 1 tablet (500 mg of elemental calcium total) by mouth 2 (two) times daily with  a meal. (Patient taking differently: Take 3 tablets by mouth See admin instructions. Take 3 tablets with each meal & with each snack) 60 tablet 2  ? carvedilol (COREG) 25 MG tablet Take 1 tablet (25 mg total) by mouth 2 (two) times daily with a meal. 60 tablet 2  ? doxazosin (CARDURA) 8 MG tablet Take 1 tablet (8 mg total) by mouth daily. (Patient taking differently: Take 8 mg by mouth at bedtime.) 30 tablet 2  ? hydrALAZINE (APRESOLINE) 100 MG tablet Take 1 tablet (100 mg total) by mouth 3 (three) times daily. Take 1 tablet by mouth three times daily 90 tablet 2  ? sertraline (ZOLOFT) 25 MG tablet Take 1 tablet (25 mg  total) by mouth daily. 30 tablet 0  ? ?No current facility-administered medications for this visit.  ? ? ?REVIEW OF SYSTEMS:  ? ?X denotes positive finding, denotes negative finding ?Cardiac  Comments:  ?Chest pain or chest pressure:    ?Shortness of breath upon exertion:    ?Short of breath when lying flat:    ?Irregular heart rhythm:    ?Constitutional    ?Fever or chills:    ? ? ?PHYSICAL EXAM:  ? ?Vitals:  ? 09/02/21 1346  ?BP: (!) 152/96  ?Pulse: 78  ?Resp: 20  ?Temp: 98.2 ?F (36.8 ?C)  ?SpO2: 100%  ?Weight: 256 lb (116.1 kg)  ?Height: 5\' 11"  (1.803 m)  ? ? ?GENERAL: The patient is a well-nourished male, in no acute distress. The vital signs are documented above. ?CARDIOVASCULAR: There is a regular rate and rhythm. ?PULMONARY: Non-labored respirations ?Palpable thrill within fistula however it is difficult to feel in the upper arm.  I evaluated this with SonoSite.  The vein looks like appropriate diameter however there were multiple competing branches ? ?STUDIES:  ? ?OUTFLOW VEINPSV (cm/s)Diameter (cm)Depth (cm)           Describe        ?      ?+------------+----------+-------------+-----------+------------------------  ?----+  ?                                                                        ?      ?+------------+----------+-------------+-----------+------------------------  ?----+  ?AC Fossa       133        0.64        0.23     competing branch  ?0.33cm 75   ?                                                          cm/s          ?      ?+------------+----------+-------------+-----------+------------------------  ?----+  ?Prox Forearm   126        0.51        0.47    competing branch 0.20  ?cm 57   ?  cm/s          ?      ?+------------+----------+-------------+-----------+------------------------  ?----+  ?Mid Forearm    165        0.47        0.48     competing branch and  ?0.24,   ?                                                         121 cm/s        ?      ?+------------+----------+-------------+-----------+------------------------  ?----+  ?Dist Forearm   585     0.37 / 0.29 0.63 / 0.65      slight narrowing    ?      ?+------------+----------+-------------+-----------+------------------------  ?----+  ? ? ?MEDICAL ISSUES:  ? ?None maturing fistula: I discussed with the patient that I think the neck step is branch ligation and possibly elevation.  This is scheduled for May 12 on a Friday ? ?Annamarie [place], IV, MD, FACS ?[facility] ?Tel [phone] ?Pager [phone] ? ? ?  ?

## 2021-09-04 ENCOUNTER — Other Ambulatory Visit: Payer: Self-pay

## 2021-09-04 ENCOUNTER — Encounter: Payer: Self-pay | Admitting: Internal Medicine

## 2021-09-04 ENCOUNTER — Ambulatory Visit (INDEPENDENT_AMBULATORY_CARE_PROVIDER_SITE_OTHER): Payer: No Typology Code available for payment source | Admitting: Internal Medicine

## 2021-09-04 ENCOUNTER — Ambulatory Visit (INDEPENDENT_AMBULATORY_CARE_PROVIDER_SITE_OTHER): Payer: Medicare Other | Admitting: Internal Medicine

## 2021-09-04 VITALS — BP 160/100 | HR 88 | Temp 98.1°F | Ht 70.0 in | Wt 258.6 lb

## 2021-09-04 DIAGNOSIS — Z9989 Dependence on other enabling machines and devices: Secondary | ICD-10-CM

## 2021-09-04 DIAGNOSIS — G4733 Obstructive sleep apnea (adult) (pediatric): Secondary | ICD-10-CM

## 2021-09-04 DIAGNOSIS — F17200 Nicotine dependence, unspecified, uncomplicated: Secondary | ICD-10-CM

## 2021-09-04 DIAGNOSIS — R0602 Shortness of breath: Secondary | ICD-10-CM | POA: Diagnosis not present

## 2021-09-04 DIAGNOSIS — J449 Chronic obstructive pulmonary disease, unspecified: Secondary | ICD-10-CM

## 2021-09-04 LAB — PULMONARY FUNCTION TEST
DL/VA % pred: 100 %
DL/VA: 4.44 ml/min/mmHg/L
DLCO cor % pred: 80 %
DLCO cor: 23.76 ml/min/mmHg
DLCO unc % pred: 69 %
DLCO unc: 20.47 ml/min/mmHg
FEF 25-75 Post: 3.46 L/sec
FEF 25-75 Pre: 2.28 L/sec
FEF2575-%Change-Post: 51 %
FEF2575-%Pred-Post: 103 %
FEF2575-%Pred-Pre: 68 %
FEV1-%Change-Post: 12 %
FEV1-%Pred-Post: 78 %
FEV1-%Pred-Pre: 69 %
FEV1-Post: 2.63 L
FEV1-Pre: 2.34 L
FEV1FVC-%Change-Post: -2 %
FEV1FVC-%Pred-Pre: 100 %
FEV6-%Change-Post: 15 %
FEV6-%Pred-Post: 82 %
FEV6-%Pred-Pre: 71 %
FEV6-Post: 3.36 L
FEV6-Pre: 2.91 L
FEV6FVC-%Pred-Post: 102 %
FEV6FVC-%Pred-Pre: 102 %
FVC-%Change-Post: 15 %
FVC-%Pred-Post: 80 %
FVC-%Pred-Pre: 69 %
FVC-Post: 3.36 L
FVC-Pre: 2.91 L
Post FEV1/FVC ratio: 78 %
Post FEV6/FVC ratio: 100 %
Pre FEV1/FVC ratio: 80 %
Pre FEV6/FVC Ratio: 100 %

## 2021-09-04 MED ORDER — BUDESONIDE-FORMOTEROL FUMARATE 160-4.5 MCG/ACT IN AERO
2.0000 | INHALATION_SPRAY | Freq: Every day | RESPIRATORY_TRACT | 5 refills | Status: DC
Start: 2021-09-04 — End: 2022-05-14
  Filled 2021-09-04: qty 10.2, 30d supply, fill #0
  Filled 2021-12-06: qty 10.2, 60d supply, fill #0
  Filled 2022-05-14: qty 10.2, 30d supply, fill #0

## 2021-09-04 NOTE — Progress Notes (Signed)
PFT done today. 

## 2021-09-04 NOTE — Patient Instructions (Addendum)
Please schedule follow up scheduled with myself in 3 months.  If my schedule is not open yet, we will contact you with a reminder closer to that time. Please call [phone] if you haven't heard from [place] a month before.  ? ?Before your next visit I would like you to have: ?CPAP titration study - they will get you set up with a CPAP ? ?We will refer you to a DME company to help manage your CPAP.  ? ?Symbicort and albuterol are at the pharmacy.  ?

## 2021-09-04 NOTE — Progress Notes (Signed)
? ?      ?KAGAN MUTCHLER    [id number]    Jun 15, 1970 ? ?Primary Care Physician:Stephens, Amy J, NP ?Date of Appointment: 09/04/2021 ?Established Patient Visit ? ?Chief complaint:   ?Chief Complaint  ?Patient presents with  ? Follow-up  ?  F/u after PFT.  ? ? ? ?HPI: ?Matthew Odom is a 51 y.o. man with OSA on CPAP, ESRD, Heart Failure and COPD.  ? ?Interval Updates: ?Here for follow up after PFTS. He was started on stiolto at last visit. He was also instructed to resume his prn albuterol inhaler. The patient reportedly fell asleep during the PFTs and thus they were unable to obtain lung volumes.  ? ?Had episode of nonresponsiveness in march 2023 and quickly returned back to baseline. He went to the ED and was ruled out for PE and MI.  ? ?HE was given a CPAP machine by [facility] and he has brought the machine with him today and SD card. The download from the card shows the machine is registered to a different patient. There eis less than 47% adherence. With significant AHI of 25 with sunbstantial leak during use.  ? ? ?He says he wears oxygen when he falls asleep in dialysis and feels much better.  ? ?He feels symbicort helps him the most. He says when he got to the pharmacy to pick up inhalers last time they never gave any to him.  ? ?I have reviewed the patient's family social and past medical history and updated as appropriate.  ? ?Past Medical History:  ?Diagnosis Date  ? Anxiety   ? Cocaine use   ? 07/15/21- "last time was more than 10 years ago."  ? Complication of anesthesia   ? wakes up during surgery  ? COPD (chronic obstructive pulmonary disease) ([place])   ? Coronary artery disease   ? mild non obstructed  ? Diabetes mellitus without complication ([place])   ? Dyspnea   ? Enlarged heart   ? ESRD (end stage renal disease) ([place])   ? ESRD (end stage renal disease) ([place])   ? TTHSAT  ? History of degenerative disc disease   ? Hyperlipidemia   ? Hypertension   ? NSTEMI (non-ST elevated myocardial infarction) ([place])   ?  Pneumonia   ? Sciatic nerve pain   ? Stroke [facility])   ? no residual, x 3 last one was in 2020  ? ? ?Past Surgical History:  ?Procedure Laterality Date  ? AV FISTULA PLACEMENT Left 07/17/2021  ? Procedure: CREATION  OF LEFT ARM RADIOCEPHALIC ARTERIOVENOUS (AV) FISTULA;  Surgeon: Serafina Mitchell, MD;  Location: [facility];  Service: Vascular;  Laterality: Left;  ? CARDIAC CATHETERIZATION    ? IR FLUORO GUIDE CV LINE RIGHT  05/20/2021  ? IR US GUIDE VASC ACCESS RIGHT  05/20/2021  ? NO PAST SURGERIES    ? ? ?Family History  ?Problem Relation Age of Onset  ? Heart attack Mother   ? Hypertension Mother   ? Diabetes Mother   ? Heart disease Sister   ? Hypertension Sister   ? Hypertension Father   ? Emphysema Father   ? ? ?Social History  ? ?Occupational History  ? Not on file  ?Tobacco Use  ? Smoking status: Every Day  ?  Packs/day: 0.50  ?  Years: 40.00  ?  Pack years: 20.00  ?  Types: Cigarettes  ? Smokeless tobacco: Never  ? Tobacco comments:  ?  Started smoking again in  April.  1/2 ppd.  09/04/2021 hfb  ?Vaping Use  ? Vaping Use: Never used  ?Substance and Sexual Activity  ? Alcohol use: Not Currently  ? Drug use: Not Currently  ?  Types: Cocaine  ? Sexual activity: Yes  ? ? ? ?Physical Exam: ?Blood pressure (!) 160/100, pulse 88, temperature 98.1 ?F (36.7 ?C), temperature source Oral, height 5\' 10"  (1.778 m), weight 258 lb 9.6 oz (117.3 kg), SpO2 98 %. ? ?Gen:      No acute distress. Falling asleep. ?ENT:  mallampati IV ?Lungs:    No increased respiratory effort, symmetric chest wall excursion, clear to auscultation bilaterally, no wheezes or crackles ?CV:         Regular rate and rhythm; no murmurs, rubs, or gallops.  No pedal edema ? ? ?Data Reviewed: ?Imaging: ?I have personally reviewed the CTPE study April 2023 no acute PE = stable 3-4 mm pumonary nodules unchanged from 2020.  ? ?PFTs: ? ? ?  Latest Ref Rng & Units 09/04/2021  ?  9:39 AM  ?PFT Results  ?FVC-Pre L 2.91  P  ?FVC-Predicted Pre % 69  P  ?FVC-Post L 3.36  P   ?FVC-Predicted Post % 80  P  ?Pre FEV1/FVC % % 80  P  ?Post FEV1/FCV % % 78  P  ?FEV1-Pre L 2.34  P  ?FEV1-Predicted Pre % 69  P  ?FEV1-Post L 2.63  P  ?DLCO uncorrected ml/min/mmHg 20.47  P  ?DLCO UNC% % 69  P  ?DLCO corrected ml/min/mmHg 23.76  P  ?DLCO COR %Predicted % 80  P  ?DLVA Predicted % 100  P  ?  ?P Preliminary result  ? ?I have personally reviewed the patient's PFTs and show no airflow limitation. He does have a significant BD response. His diffusion capacity was mildly reduced.  ? ?Labs: ? ?Immunization status: ?Immunization History  ?Administered Date(s) Administered  ? PFIZER(Purple Top)SARS-COV-2 Vaccination 12/24/2019, 01/14/2020  ? ? ?External Records Personally Reviewed: ED visit ? ?Assessment:  ?Shortness of breath - multifactorial from mild COPD, ESRD, CHF,  ?OSA on CPAP - not well treated ?Tobacco use disorder ? ?Plan/Recommendations: ? ?Will order cpap titration study ?Will help him establish with a DME company ?Discussed quitting smoking - he says he is working on it.  ?Will get back on symbicort with prn albuterol since those helped him.  ? ? ?Return to Care: ?Return in about 3 months (around 12/05/2021). ? ? ?Lenice Llamas, MD ?Pulmonary and Critical Care Medicine ?[place] ?Office:[phone] ? ? ? ? ? ?

## 2021-09-05 DIAGNOSIS — N2581 Secondary hyperparathyroidism of renal origin: Secondary | ICD-10-CM | POA: Diagnosis not present

## 2021-09-05 DIAGNOSIS — E876 Hypokalemia: Secondary | ICD-10-CM | POA: Diagnosis not present

## 2021-09-05 DIAGNOSIS — N186 End stage renal disease: Secondary | ICD-10-CM | POA: Diagnosis not present

## 2021-09-05 DIAGNOSIS — Z992 Dependence on renal dialysis: Secondary | ICD-10-CM | POA: Diagnosis not present

## 2021-09-10 ENCOUNTER — Other Ambulatory Visit: Payer: Self-pay

## 2021-09-10 DIAGNOSIS — E876 Hypokalemia: Secondary | ICD-10-CM | POA: Diagnosis not present

## 2021-09-10 DIAGNOSIS — N186 End stage renal disease: Secondary | ICD-10-CM | POA: Diagnosis not present

## 2021-09-10 DIAGNOSIS — N2581 Secondary hyperparathyroidism of renal origin: Secondary | ICD-10-CM | POA: Diagnosis not present

## 2021-09-10 DIAGNOSIS — Z992 Dependence on renal dialysis: Secondary | ICD-10-CM | POA: Diagnosis not present

## 2021-09-11 ENCOUNTER — Other Ambulatory Visit: Payer: Self-pay

## 2021-09-12 ENCOUNTER — Encounter (HOSPITAL_COMMUNITY): Payer: Self-pay | Admitting: Surgery

## 2021-09-12 ENCOUNTER — Other Ambulatory Visit: Payer: Self-pay

## 2021-09-12 DIAGNOSIS — E876 Hypokalemia: Secondary | ICD-10-CM | POA: Diagnosis not present

## 2021-09-12 DIAGNOSIS — Z992 Dependence on renal dialysis: Secondary | ICD-10-CM | POA: Diagnosis not present

## 2021-09-12 DIAGNOSIS — N2581 Secondary hyperparathyroidism of renal origin: Secondary | ICD-10-CM | POA: Diagnosis not present

## 2021-09-12 DIAGNOSIS — N186 End stage renal disease: Secondary | ICD-10-CM | POA: Diagnosis not present

## 2021-09-12 NOTE — Progress Notes (Signed)
Anesthesia Chart Review: SAME DAY WORK-UP ? Case: [id number] Date/Time: 09/13/21 0715  ? Procedure: LIGATION OF COMPETING BRANCHES OF ARTERIOVENOUS FISTULA AND ELEVATION (Left)  ? Anesthesia type: Choice  ? Pre-op diagnosis: END STAGE RENAL DISEASE  ? [facility] ROOM 11 / [facility]  ? Surgeons: Serafina Mitchell, MD  ? ?  ? ? ?DISCUSSION: Patient is a 51 year old male scheduled for the above procedure.  He is s/p left radiocephalic AVF creation on 1/91/47. He had follow-up with Dr. Trula Slade on 09/02/21 and AVF was not maturing, so ligation of competing branch(es) recommended. He is currently undergoing HD [facility]) via right sided TDC. ? ?History includes smoking, COPD, OSA (severe OSA with AHI 74.2/hr 12/18/19 sleep study), ESRD, hypertensive heart disease, NICM (diagnosed 03/2015; severe LVH, EF 45-50% by echo 06/2019), recurrent chest pain (evaluated on numerous occasions), CAD (elevated troponin in setting of hypertensive urgency, + cocaine UDS 02/2016->02/2016 LHC showed mild, nonobstructive CAD with 30% RCA, otherwise normal coronaries; elevated troponins, demand ischemia in setting of ESRD, hypertensive urgency, CHF 01/2018), CVA x 3 (last reportedly 2020), former cocaine use (reported > 10 years ago; last + UDS seen 06/11/19). Reported "wakes up during surgery."   ? ?- [place] ED visit 08/02/21 following syncope at home while playing cards. "Lasted a few minutes and then he woke up. Quick return to baseline per report." No chest pain or SOB. LBBB type pattern on EKG and EMS called CODE STEMI. Pattern not significant changed when compared to prior tracings since at least 12/26/19, and cardiology cancelled CODE STEMI. No PE on CTA, although incidental finding of scattered 3-4 mm pulmonary nodules and 4.2 cm ascending aortic aneurysm noted with recommendation to follow-up with PCP. Troponin HS 66->66 (previously also elevated 49->53 07/16/21 and mildly elevated, flat on 06/29/18 and 01/03/18). Ethyl alcohol elevated at  68 (H), magnesium 1.6 (L), K 3.4 (L), glucose 193 (H), HGB 10.5 (L).  BP 169/112. Per ED Provider notes, "Labs reveal no significant blood count or electrolyte disturbance.  Elevated creatinine anticipated given patient's ESRD.  Troponin is minimally elevated and flat upon repeat, with no chest pain favoring more related to the ESRD than heart strain or damage.Marland KitchenMarland KitchenPatient continues to feel well with no symptoms, he wants to go home.  Admission offered/suggested for telemetry.  Declined by patient, will follow closely, strict return precautions." ? ?- Creation of right radiocephalic AVF on 01/01/55 under regional and MAC anesthesia. ? ?- ED visit at [facility] 07/16/21 for chest pain. Workup benign. EKG without acute changes. Troponin mildly elevated in setting of ESRD but flat on recheck 53>>49. Remainder of workup unremarkable. He was discharged for outpatient followup. ?  ?Followed by pulmonologist Dr. Shearon Stalls. Last visit 09/04/21 for follow-up dyspnea and PFTs.  Dyspnea felt multifactorial from mild COPD, ESRD, CHF.  His OSA was not well treated and a CPAP titration study was ordered.  Smoking cessation also discussed.  Recommended that he get back on Symbicort and as needed albuterol.  23-monthfollow-up planned. ? ?Followed by endocrinologist Dr. SKelton Pillar last visit 06/19/21. A1c on 06/14/21 was 10.5%. Victoza 1.8 mg daily and Basaglar 20 units daily continued.  NovoLog 4 units 3 times daily with meals started. 08/20/21 A1c at [facility] was down to 7.0%.  ? ?Discussed case with anesthesiologist EAdele Barthel MD.  Anesthesia team to evaluate on the day of surgery. ? ? ?VS:  ?BP Readings from Last 3 Encounters:  ?09/04/21 (!) 160/100  ?09/02/21 (!) 152/96  ?08/03/21 (!) 180/121  ? ?  Pulse Readings from Last 3 Encounters:  ?09/04/21 88  ?09/02/21 78  ?08/03/21 79  ?  ? ?PROVIDERS: ?Camillia Herter, NP is PCP  ?- Harvie Junior, MD is pulmonologist. Last visit 09/04/21 for follow-up dyspnea and PFTs.  Dyspnea felt  multifactorial from mild COPD, ESRD, CHF.  His OSA was not well treated and a CPAP titration study was ordered.  Smoking cessation also discussed.  Recommended that he get back on Symbicort and as needed albuterol.  45-monthfollow-up planned. ?- Shamleffer, IMammie Lorenzo MD is endocrinologist. ?- He does not see a cardiologist regularly, although multiple evaluations for chest pain, non-ischemic CM. Last visits 01/12/20 with RSkeet Latch MD for HTN and 12/26/19 with OEleonore Chiquito MD. [facility] ? ? ?LABS: He will need labs on arrival as indicated. [facility] ?Lab Results  ?Component Value Date  ? WBC 9.1 08/02/2021  ? HGB 10.5 (L) 08/02/2021  ? HCT 32.7 (L) 08/02/2021  ? PLT 239 08/02/2021  ? GLUCOSE 193 (H) 08/02/2021  ? ALT 13 05/09/2021  ? AST 14 (L) 05/09/2021  ? NA 137 08/02/2021  ? K 3.4 (L) 08/02/2021  ? CL 104 08/02/2021  ? CREATININE 4.36 (H) 08/02/2021  ? BUN 33 (H) 08/02/2021  ? CO2 22 08/02/2021  ? TSH 1.850 01/29/2021  ? ?A1c 7.0% 08/20/21 at [facility]. ? ? ?PFTs 09/04/21: FVC 2.91 (69%), post 3.36 (80%). FEV1 2.34 (69%), post 2.63 (78%). DLCO unc 20.47 (69%), cor 23.76 (80%).  ? ? ?IMAGES: ?CTA Chest 08/03/21: ?IMPRESSION: ?1. No evidence of pulmonary embolism or other acute process. ?2. Scattered 3-4 mm pulmonary nodules in the right upper and middle ?lobes, unchanged from 2020. No follow-up needed if patient is ?low-risk (and has no known or suspected primary neoplasm). ?Non-contrast chest CT can be considered in 12 months if patient is ?high-risk. This recommendation follows the consensus statement: ?Guidelines for Management of Incidental Pulmonary Nodules Detected ?on CT Images: From the Fleischner Society 2017; Radiology 2017; ?284:228-243. ?3. Ascending aortic aneurysm measuring 4.2 cm. Recommend annual ?imaging followup by CTA or MRA. This recommendation follows 2010 ?ACCF/AHA/AATS/ACR/ASA/SCA/SCAI/SIR/STS/SVM Guidelines for  the ?Diagnosis and Management of Patients with Thoracic Aortic Disease. ?Circulation. 2010; 121:: B583-E940 Aortic aneurysm NOS (ICD10-I71.9) ?4. Cardiomegaly with trace pericardial effusion. ?- Per ED provider BGerlene Fee MD to patient, "We found a thoracic aortic aneurysm on your CT scan.  You will likely need repeat testing every year to make sure it does not get bigger.  Please discuss this with your primary care doctor." ? ?CXR 08/02/21: ?FINDINGS: ?Right central venous catheter with tip over the cavoatrial junction ?region. No pneumothorax. Cardiac enlargement. Lungs are clear. No ?pleural effusions. Mediastinal contours appear intact. ?IMPRESSION: ?Cardiac enlargement.  No evidence of active pulmonary disease ? ? ?EKG:EKG 08/02/21: ?Sinus rhythm ?Probable left ventricular hypertrophy ?Nonspecific T abnormalities, lateral leads ?ST elevation, consider anterior injury ?Confirmed by BGerlene Fee([phone] on 08/02/2021 11:31:41 PM ?- QRS 130 ms, previously 120 ms on 07/16/21 ? ? ?CV: ?Limited [place]: ?Left Ventricle: Mildly dilated left ventricle. There is severe  ?concentric hypertrophy (IVSd and LVPWd both 235m. Systolic function is  ?mildly abnormal. EF: 45-50%. Global hypokinesis.  ?  Right Ventricle: Grossly normal RV size and systolic function.  ?  Left Atrium: Left atrium cavity is moderately dilated.  ?  Aorta: Borderline dilated aortic root (3974m  ?  Pericardium: There is a trivial pericardial effusion noted.  ? ?Complete  Echo 09/29/18 ([place]): ?Interpretation Summary  ?A complete portable two-dimensional transthoracic echocardiogram with color  ?flow Doppler and Spectral Doppler was performed. The left ventricle is  ?moderately dilated.  ?There is severe concentric left ventricular hypertrophy.  ?The aortic valve is trileaflet.  ?Mild aortic sclerosis is present with good valvular opening.  ?Grade I mild diastolic dysfunction; abnormal relaxation pattern.   ?The left ventricular ejection fraction is normal (50-55%).  ?The left ventricular wall motion is normal.  ?The left atrium is moderately dilated.  ?The right atrium is mildly dilated.  ?Estimation of right ventricular sys

## 2021-09-12 NOTE — Anesthesia Preprocedure Evaluation (Addendum)
Anesthesia Evaluation  ?Patient identified by MRN, date of birth, ID band ?Patient awake ? ? ? ?Reviewed: ?Allergy & Precautions, H&P , NPO status , Patient's Chart, lab work & pertinent test results, reviewed documented beta blocker date and time  ? ?Airway ?Mallampati: III ? ?TM Distance: >3 FB ?Neck ROM: Full ? ? ? Dental ?no notable dental hx. ?(+) Teeth Intact, Dental Advisory Given ?  ?Pulmonary ?sleep apnea and Continuous Positive Airway Pressure Ventilation , COPD,  COPD inhaler, Current Smoker and Patient abstained from smoking.,  ?  ?Pulmonary exam normal ?breath sounds clear to auscultation ? ? ? ? ? ? Cardiovascular ?Exercise Tolerance: Good ?hypertension, Pt. on medications and Pt. on home beta blockers ?+ Past MI and +CHF  ? ?Rhythm:Regular Rate:Normal ? ? ?  ?Neuro/Psych ?Anxiety CVA, No Residual Symptoms   ? GI/Hepatic ?negative GI ROS, Neg liver ROS,   ?Endo/Other  ?diabetes, Insulin Dependent ? Renal/GU ?ESRF and DialysisRenal disease  ?negative genitourinary ?  ?Musculoskeletal ? ? Abdominal ?  ?Peds ? Hematology ?negative hematology ROS ?(+)   ?Anesthesia Other Findings ? ? Reproductive/Obstetrics ?negative OB ROS ? ?  ? ? ? ? ? ? ? ? ? ? ? ? ? ?  ?  ? ? ? ? ? ? ?Anesthesia Physical ?Anesthesia Plan ? ?ASA: 3 ? ?Anesthesia Plan: MAC  ? ?Post-op Pain Management: Regional block*, Tylenol PO (pre-op)* and Minimal or no pain anticipated  ? ?Induction: Intravenous ? ?PONV Risk Score and Plan: 0 and Propofol infusion, Midazolam and Ondansetron ? ?Airway Management Planned: Natural Airway and Simple Face Mask ? ?Additional Equipment:  ? ?Intra-op Plan:  ? ?Post-operative Plan:  ? ?Informed Consent: I have reviewed the patients History and Physical, chart, labs and discussed the procedure including the risks, benefits and alternatives for the proposed anesthesia with the patient or authorized representative who has indicated his/her understanding and acceptance.   ? ? ? ?Dental advisory given ? ?Plan Discussed with: CRNA ? ?Anesthesia Plan Comments: (PAT note written 09/12/2021 by Myra Gianotti, PA-C. ?)  ? ? ? ? ?Anesthesia Quick Evaluation ? ?

## 2021-09-12 NOTE — Pre-Procedure Instructions (Addendum)
SDW CALL ? ?Patient was given pre-op instructions over the phone. The opportunity was given for the patient to ask questions. No further questions asked. Patient verbalized understanding of instructions given. ? ? ?PCP - Camillia Herter, NP ?Cardiologist - pt cannot think of who his cardiologist is- has not been to recently ? ?PPM/ICD - denies ?Chest x-ray - 08/02/21 ?EKG - 08/02/21 ?Stress Test - denies ?ECHO - 06/29/18, 06/12/19 in care everywhere ?Cardiac Cath - 02/15/16 in care everywhere ? ?Sleep Study - denies sleep apnea ? ?Fasting Blood Sugar - normally around 140 per patient. Hgb A1c 7 per patient at dialysis- Summary in care everywhere from [place] shows Hgb A1c of 7 on 08/20/21 ? ? ?Blood Thinner Instructions: N/A ? ? ?ERAS Protcol -NPO order ? ? ?COVID TEST- N/A ? ? ?Anesthesia review: yes- heart history- pt denies any cardiac symptoms or recent cardiac workup. Pt states he does not know who his cardiologist is, and has not seen them in "a while" patient did say that he has seen them within the last year. Patient had dialysis today.  ? ?Patient denies shortness of breath, fever, cough and chest pain over the phone call ? ? ? ?Surgical Instructions ? ? ? Your procedure is scheduled on Friday, May 12 ? Report to [facility] Main Entrance "A" at 5:30 A.M., then check in with the Admitting office. ? Call this number if you have problems the morning of surgery: ? [phone] ? ? ? Remember: ? Do not eat or drink after midnight the night before your surgery ? ? ? ? Take these medicines the morning of surgery with A SIP OF WATER:  ? ?amLODipine (NORVASC) ?carvedilol (COREG)  ?atorvastatin (LIPITOR)  ?hydrALAZINE (APRESOLINE) ?sertraline (ZOLOFT) ?budesonide-formoterol (SYMBICORT)  ?acetaminophen (TYLENOL) 500 MG tablet if needed ?albuterol (PROVENTIL) nebulizer or inhaler ? ? ?THE NIGHT BEFORE SURGERY, take 5 units (half dose) of your Insulin Glargine (Basaglar)    ? ?THE MORNING OF SURGERY, take 5  units (half dose) of your Insulin Glargine (Basaglar) ? ?The day of surgery, do not take other diabetes injectables, including Byetta (exenatide), Bydureon (exenatide ER), Victoza (liraglutide), or Trulicity (dulaglutide). ? ?The night before surgery, do not take your Insulin Lispro ? ?The morning of surgery, If your CBG is greater than 220 mg/dL, you may take ? of your sliding scale (correction) dose of insulin. (Insulin Lispro) ? ?Check your blood sugar the morning of your surgery when you wake up and every 2 hours until you get to the Short Stay unit. ? ?If your blood sugar is less than 70 mg/dL, you will need to treat for low blood sugar: ?Do not take insulin. ?Treat a low blood sugar (less than 70 mg/dL) with ? cup of clear juice (cranberry or apple), 4 glucose tablets, OR glucose gel. ?Recheck blood sugar in 15 minutes after treatment (to make sure it is greater than 70 mg/dL). If your blood sugar is not greater than 70 mg/dL on recheck, call [phone] for further instructions. ?Report your blood sugar to the short stay nurse when you get to Short Stay. ? ? ?As of today, STOP taking any Aleve, Naproxen, Ibuprofen, Motrin, Advil, Goody's, BC's, all herbal medications, fish oil, and all vitamins. ? ?[facility] is not responsible for any belongings or valuables. ? ?Do NOT Smoke (Tobacco/Vaping)  24 hours prior to your procedure ?  ?Contacts, glasses, hearing aids, dentures or partials may not be worn into surgery, please bring cases for these belongings ?  ?  Patients discharged the day of surgery will not be allowed to drive home, and someone needs to stay with them for 24 hours. ? ? ?SURGICAL WAITING ROOM VISITATION ?No visitors are allowed in pre-op area with patient.  ?Patients having surgery or a procedure in a hospital may have two support people in the waiting room. ?Children under the age of 14 must have an adult with them who is not the patient. ?They may stay in the waiting area during the procedure  and may switch out with other visitors. If the patient needs to stay at the hospital during part of their recovery, the visitor guidelines for inpatient rooms apply. ? ?Please refer to the [place] website for the visitor guidelines for Inpatients (after your surgery is over and you are in a regular room).  ? ? ? ?Special instructions:   ? ?Oral Hygiene is also important to reduce your risk of infection.  Remember - BRUSH YOUR TEETH THE MORNING OF SURGERY WITH YOUR REGULAR TOOTHPASTE ? ? ?Day of Surgery: ? ?Take a shower the day of or night before with antibacterial soap. ?Wear Clean/Comfortable clothing the morning of surgery ?Do not apply any deodorants/lotions.   ?Do not wear jewelry or makeup ?Do not wear lotions, powders, perfumes/colognes, or deodorant. ?Do not shave 48 hours prior to surgery.  Men may shave face and neck. ?Do not bring valuables to the hospital. ?Do not wear nail polish, gel polish, artificial nails, or any other type of covering on natural nails (fingers and toes) ?If you have artificial nails or gel coating that need to be removed by a nail salon, please have this removed prior to surgery. Artificial nails or gel coating may interfere with anesthesia's ability to adequately monitor your vital signs. ?Remember to brush your teeth WITH YOUR REGULAR TOOTHPASTE. ? ? ? ? ? ?

## 2021-09-13 ENCOUNTER — Ambulatory Visit (HOSPITAL_COMMUNITY)
Admission: RE | Admit: 2021-09-13 | Discharge: 2021-09-13 | Disposition: A | Discharge status: Home / Self Care | Payer: Medicare Other | Attending: Surgery | Admitting: Surgery

## 2021-09-13 ENCOUNTER — Encounter (HOSPITAL_COMMUNITY): Payer: Self-pay | Admitting: Surgery

## 2021-09-13 ENCOUNTER — Other Ambulatory Visit: Payer: Self-pay

## 2021-09-13 ENCOUNTER — Ambulatory Visit (HOSPITAL_COMMUNITY): Payer: Medicare Other | Admitting: Emergency Medicine

## 2021-09-13 ENCOUNTER — Ambulatory Visit (HOSPITAL_BASED_OUTPATIENT_CLINIC_OR_DEPARTMENT_OTHER): Payer: Medicare Other | Admitting: Emergency Medicine

## 2021-09-13 ENCOUNTER — Encounter (HOSPITAL_COMMUNITY)
Admission: RE | Disposition: A | Discharge status: Home / Self Care | Payer: Self-pay | Source: Home / Self Care | Attending: Surgery

## 2021-09-13 DIAGNOSIS — Z794 Long term (current) use of insulin: Secondary | ICD-10-CM | POA: Diagnosis not present

## 2021-09-13 DIAGNOSIS — I132 Hypertensive heart and chronic kidney disease with heart failure and with stage 5 chronic kidney disease, or end stage renal disease: Secondary | ICD-10-CM

## 2021-09-13 DIAGNOSIS — J449 Chronic obstructive pulmonary disease, unspecified: Secondary | ICD-10-CM | POA: Insufficient documentation

## 2021-09-13 DIAGNOSIS — G4730 Sleep apnea, unspecified: Secondary | ICD-10-CM | POA: Diagnosis not present

## 2021-09-13 DIAGNOSIS — Z8673 Personal history of transient ischemic attack (TIA), and cerebral infarction without residual deficits: Secondary | ICD-10-CM | POA: Diagnosis not present

## 2021-09-13 DIAGNOSIS — E1122 Type 2 diabetes mellitus with diabetic chronic kidney disease: Secondary | ICD-10-CM | POA: Diagnosis not present

## 2021-09-13 DIAGNOSIS — I509 Heart failure, unspecified: Secondary | ICD-10-CM | POA: Diagnosis not present

## 2021-09-13 DIAGNOSIS — I252 Old myocardial infarction: Secondary | ICD-10-CM | POA: Diagnosis not present

## 2021-09-13 DIAGNOSIS — N186 End stage renal disease: Secondary | ICD-10-CM | POA: Diagnosis not present

## 2021-09-13 DIAGNOSIS — Z992 Dependence on renal dialysis: Secondary | ICD-10-CM | POA: Diagnosis not present

## 2021-09-13 DIAGNOSIS — F17210 Nicotine dependence, cigarettes, uncomplicated: Secondary | ICD-10-CM | POA: Diagnosis not present

## 2021-09-13 HISTORY — DX: Sleep apnea, unspecified: G47.30

## 2021-09-13 HISTORY — DX: Aneurysm of the ascending aorta, without rupture: I71.21

## 2021-09-13 HISTORY — PX: LIGATION OF COMPETING BRANCHES OF ARTERIOVENOUS FISTULA: SHX5949

## 2021-09-13 LAB — POCT I-STAT, CHEM 8
BUN: 41 mg/dL — ABNORMAL HIGH (ref 6–20)
Calcium, Ion: 1.04 mmol/L — ABNORMAL LOW (ref 1.15–1.40)
Chloride: 100 mmol/L (ref 98–111)
Creatinine, Ser: 6.4 mg/dL — ABNORMAL HIGH (ref 0.61–1.24)
Glucose, Bld: 188 mg/dL — ABNORMAL HIGH (ref 70–99)
HCT: 39 % (ref 39.0–52.0)
Hemoglobin: 13.3 g/dL (ref 13.0–17.0)
Potassium: 3.8 mmol/L (ref 3.5–5.1)
Sodium: 137 mmol/L (ref 135–145)
TCO2: 26 mmol/L (ref 22–32)

## 2021-09-13 LAB — GLUCOSE, CAPILLARY
Glucose-Capillary: 199 mg/dL — ABNORMAL HIGH (ref 70–99)
Glucose-Capillary: 159 mg/dL — ABNORMAL HIGH (ref 70–99)
Glucose-Capillary: 143 mg/dL — ABNORMAL HIGH (ref 70–99)

## 2021-09-13 SURGERY — LIGATION OF COMPETING BRANCHES OF ARTERIOVENOUS FISTULA
Anesthesia: Monitor Anesthesia Care | Site: Arm Lower | Laterality: Left

## 2021-09-13 MED ORDER — HEPARIN 6000 UNIT IRRIGATION SOLUTION
Status: DC | PRN
  Administered 2021-09-13: 1

## 2021-09-13 MED ORDER — SODIUM CHLORIDE 0.9 % IV SOLN: INTRAVENOUS | Status: DC

## 2021-09-13 MED ORDER — MIDAZOLAM HCL 2 MG/2ML IJ SOLN
INTRAMUSCULAR | Status: DC | PRN
  Administered 2021-09-13 (×2): 1 mg via INTRAVENOUS

## 2021-09-13 MED ORDER — CHLORHEXIDINE GLUCONATE 4 % EX LIQD: 60.0000 mL | Freq: Once | CUTANEOUS | Status: DC

## 2021-09-13 MED ORDER — ONDANSETRON HCL 4 MG/2ML IJ SOLN
INTRAMUSCULAR | Status: DC | PRN
  Administered 2021-09-13: 4 mg via INTRAVENOUS

## 2021-09-13 MED ORDER — ORAL CARE MOUTH RINSE: 15.0000 mL | Freq: Once | OROMUCOSAL | Status: AC

## 2021-09-13 MED ORDER — PROPOFOL 1000 MG/100ML IV EMUL
INTRAVENOUS | Status: AC
  Filled 2021-09-13: qty 100

## 2021-09-13 MED ORDER — FENTANYL CITRATE (PF) 250 MCG/5ML IJ SOLN
INTRAMUSCULAR | Status: AC
  Filled 2021-09-13: qty 5

## 2021-09-13 MED ORDER — ONDANSETRON HCL 4 MG/2ML IJ SOLN
INTRAMUSCULAR | Status: AC
  Filled 2021-09-13: qty 2

## 2021-09-13 MED ORDER — HEPARIN SODIUM (PORCINE) 1000 UNIT/ML IJ SOLN
1.9000 mL | Freq: Once | INTRAMUSCULAR | Status: AC
  Administered 2021-09-13: 1900 [IU] via INTRAVENOUS

## 2021-09-13 MED ORDER — ACETAMINOPHEN 500 MG PO TABS
ORAL_TABLET | ORAL | Status: AC
  Administered 2021-09-13: 1000 mg via ORAL
  Filled 2021-09-13: qty 2

## 2021-09-13 MED ORDER — CHLORHEXIDINE GLUCONATE 0.12 % MT SOLN: 15.0000 mL | Freq: Once | OROMUCOSAL | Status: AC

## 2021-09-13 MED ORDER — FENTANYL CITRATE (PF) 100 MCG/2ML IJ SOLN: 25.0000 ug | INTRAMUSCULAR | Status: DC | PRN

## 2021-09-13 MED ORDER — MIDAZOLAM HCL 2 MG/2ML IJ SOLN
INTRAMUSCULAR | Status: AC
  Filled 2021-09-13: qty 2

## 2021-09-13 MED ORDER — 0.9 % SODIUM CHLORIDE (POUR BTL) OPTIME
TOPICAL | Status: DC | PRN
  Administered 2021-09-13: 1000 mL

## 2021-09-13 MED ORDER — CHLORHEXIDINE GLUCONATE 0.12 % MT SOLN
OROMUCOSAL | Status: AC
  Administered 2021-09-13: 15 mL via OROMUCOSAL
  Filled 2021-09-13: qty 15

## 2021-09-13 MED ORDER — CEFAZOLIN SODIUM-DEXTROSE 2-4 GM/100ML-% IV SOLN
2.0000 g | INTRAVENOUS | Status: AC
  Administered 2021-09-13: 2 g via INTRAVENOUS

## 2021-09-13 MED ORDER — ACETAMINOPHEN 500 MG PO TABS: 1000.0000 mg | ORAL_TABLET | Freq: Once | ORAL | Status: AC

## 2021-09-13 MED ORDER — PROPOFOL 500 MG/50ML IV EMUL
INTRAVENOUS | Status: DC | PRN
  Administered 2021-09-13: 50 mg via INTRAVENOUS
  Administered 2021-09-13: 25 ug/kg/min via INTRAVENOUS

## 2021-09-13 MED ORDER — PHENYLEPHRINE HCL-NACL 20-0.9 MG/250ML-% IV SOLN
INTRAVENOUS | Status: DC | PRN
Start: 2021-09-13 — End: 2021-09-13
  Administered 2021-09-13: 30 ug/min via INTRAVENOUS

## 2021-09-13 MED ORDER — INSULIN ASPART 100 UNIT/ML IJ SOLN
INTRAMUSCULAR | Status: AC
  Administered 2021-09-13: 2 [IU] via SUBCUTANEOUS
  Filled 2021-09-13: qty 1

## 2021-09-13 MED ORDER — PHENYLEPHRINE HCL-NACL 20-0.9 MG/250ML-% IV SOLN
INTRAVENOUS | Status: AC
  Filled 2021-09-13: qty 500

## 2021-09-13 MED ORDER — CEFAZOLIN SODIUM-DEXTROSE 2-4 GM/100ML-% IV SOLN
INTRAVENOUS | Status: AC
  Filled 2021-09-13: qty 100

## 2021-09-13 MED ORDER — DEXAMETHASONE SODIUM PHOSPHATE 10 MG/ML IJ SOLN
INTRAMUSCULAR | Status: AC
  Filled 2021-09-13: qty 1

## 2021-09-13 MED ORDER — LIDOCAINE-EPINEPHRINE (PF) 1 %-1:200000 IJ SOLN
INTRAMUSCULAR | Status: AC
  Filled 2021-09-13: qty 30

## 2021-09-13 MED ORDER — LIDOCAINE 2% (20 MG/ML) 5 ML SYRINGE
INTRAMUSCULAR | Status: AC
  Filled 2021-09-13: qty 5

## 2021-09-13 MED ORDER — LACTATED RINGERS IV SOLN
INTRAVENOUS | Status: DC | PRN
Start: 2021-09-13 — End: 2021-09-13

## 2021-09-13 MED ORDER — INSULIN ASPART 100 UNIT/ML IJ SOLN: 0.0000 [IU] | INTRAMUSCULAR | Status: DC | PRN

## 2021-09-13 MED ORDER — HEPARIN 6000 UNIT IRRIGATION SOLUTION
Status: AC
  Filled 2021-09-13: qty 500

## 2021-09-13 MED ORDER — STERILE WATER FOR IRRIGATION IR SOLN
Status: DC | PRN
  Administered 2021-09-13: 1000 mL

## 2021-09-13 MED ORDER — MEPIVACAINE HCL (PF) 2 % IJ SOLN
INTRAMUSCULAR | Status: DC | PRN
  Administered 2021-09-13: 25 mL

## 2021-09-13 MED ORDER — OXYCODONE-ACETAMINOPHEN 5-325 MG PO TABS: 1.0000 | ORAL_TABLET | Freq: Four times a day (QID) | ORAL | 0 refills | Status: DC | PRN

## 2021-09-13 SURGICAL SUPPLY — 38 items
ADH SKN CLS APL DERMABOND .7 (GAUZE/BANDAGES/DRESSINGS) ×1
ARMBAND PINK RESTRICT EXTREMIT (MISCELLANEOUS) ×3 IMPLANT
BAG COUNTER SPONGE SURGICOUNT (BAG) ×3 IMPLANT
BAG SPNG CNTER NS LX DISP (BAG) ×1
CANISTER SUCT 3000ML PPV (MISCELLANEOUS) ×3 IMPLANT
CLIP VESOCCLUDE MED 6/CT (CLIP) ×3 IMPLANT
CLIP VESOCCLUDE SM WIDE 6/CT (CLIP) ×3 IMPLANT
COVER PROBE W GEL 5X96 (DRAPES) ×2 IMPLANT
DERMABOND ADVANCED (GAUZE/BANDAGES/DRESSINGS) ×1
DERMABOND ADVANCED .7 DNX12 (GAUZE/BANDAGES/DRESSINGS) ×2 IMPLANT
ELECT REM PT RETURN 9FT ADLT (ELECTROSURGICAL) ×2
ELECTRODE REM PT RTRN 9FT ADLT (ELECTROSURGICAL) ×2 IMPLANT
GLOVE BIOGEL PI IND STRL 6.5 (GLOVE) IMPLANT
GLOVE BIOGEL PI INDICATOR 6.5 (GLOVE) ×1
GLOVE SS BIOGEL STRL SZ 6.5 (GLOVE) IMPLANT
GLOVE SUPERSENSE BIOGEL SZ 6.5 (GLOVE) ×1
GLOVE SURG SS PI 7.5 STRL IVOR (GLOVE) ×9 IMPLANT
GOWN STRL REUS W/ TWL LRG LVL3 (GOWN DISPOSABLE) ×4 IMPLANT
GOWN STRL REUS W/ TWL XL LVL3 (GOWN DISPOSABLE) ×2 IMPLANT
GOWN STRL REUS W/TWL LRG LVL3 (GOWN DISPOSABLE) ×6
GOWN STRL REUS W/TWL XL LVL3 (GOWN DISPOSABLE) ×2
KIT BASIN OR (CUSTOM PROCEDURE TRAY) ×3 IMPLANT
KIT TURNOVER KIT B (KITS) ×3 IMPLANT
NS IRRIG 1000ML POUR BTL (IV SOLUTION) ×3 IMPLANT
PACK CV ACCESS (CUSTOM PROCEDURE TRAY) ×3 IMPLANT
PAD ARMBOARD 7.5X6 YLW CONV (MISCELLANEOUS) ×6 IMPLANT
SPONGE T-LAP 18X18 ~~LOC~~+RFID (SPONGE) ×1 IMPLANT
SUT ETHILON 3 0 PS 1 (SUTURE) ×1 IMPLANT
SUT PROLENE 6 0 BV (SUTURE) ×1 IMPLANT
SUT SILK 0 TIES 10X30 (SUTURE) ×3 IMPLANT
SUT SILK 3 0 (SUTURE) ×2
SUT SILK 3-0 18XBRD TIE 12 (SUTURE) IMPLANT
SUT VIC AB 3-0 SH 27 (SUTURE) ×6
SUT VIC AB 3-0 SH 27X BRD (SUTURE) ×2 IMPLANT
SUT VICRYL 4-0 PS2 18IN ABS (SUTURE) ×2 IMPLANT
TOWEL GREEN STERILE (TOWEL DISPOSABLE) ×3 IMPLANT
UNDERPAD 30X36 HEAVY ABSORB (UNDERPADS AND DIAPERS) ×3 IMPLANT
WATER STERILE IRR 1000ML POUR (IV SOLUTION) ×3 IMPLANT

## 2021-09-13 NOTE — Progress Notes (Signed)
Orthopedic Tech Progress Note ?Patient Details:  ?DELORIS MITTAG ?October 31, 1970 ?[id number] ? ?Ortho Devices ?Type of Ortho Device: Sling immobilizer ?Ortho Device/Splint Location: left ?Ortho Device/Splint Interventions: Application ?  ?Post Interventions ?Patient Tolerated: Well ?Instructions Provided: Care of device, Adjustment of device ? ?[place] Pink ?09/13/2021, 10:22 AM ? ?

## 2021-09-13 NOTE — Transfer of Care (Signed)
Immediate Anesthesia Transfer of Care Note ? ?Patient: Matthew Odom ? ?Procedure(s) Performed: LIGATION AND ELEVATION OF COMPETING BRANCHES OF LEFT ARM ARTERIOVENOUS FISTULA (Left: Arm Lower) ? ?Patient Location: PACU ? ?Anesthesia Type:MAC and Regional ? ?Level of Consciousness: drowsy ? ?Airway & Oxygen Therapy: Patient Spontanous Breathing and Patient connected to nasal cannula oxygen ? ?Post-op Assessment: Report given to RN and Post -op Vital signs reviewed and stable ? ?Post vital signs: Reviewed and stable ? ?Last Vitals:  ?Vitals Value Taken Time  ?BP 118/64 09/13/21 0845  ?Temp    ?Pulse 65 09/13/21 0845  ?Resp 12 09/13/21 0845  ?SpO2 100 % 09/13/21 0845  ?Vitals shown include unvalidated device data. ? ?Last Pain:  ?Vitals:  ? 09/13/21 0600  ?TempSrc:   ?PainSc: 0-No pain  ?   ? ?  ? ?Complications: No notable events documented. ?

## 2021-09-13 NOTE — Anesthesia Postprocedure Evaluation (Signed)
Anesthesia Post Note ? ?Patient: Matthew Odom ? ?Procedure(s) Performed: LIGATION AND ELEVATION OF COMPETING BRANCHES OF LEFT ARM ARTERIOVENOUS FISTULA (Left: Arm Lower) ? ?  ? ?Patient location during evaluation: PACU ?Anesthesia Type: MAC and Regional ?Level of consciousness: awake and alert ?Pain management: pain level controlled ?Vital Signs Assessment: post-procedure vital signs reviewed and stable ?Respiratory status: spontaneous breathing, nonlabored ventilation and respiratory function stable ?Cardiovascular status: stable and blood pressure returned to baseline ?Postop Assessment: no apparent nausea or vomiting ?Anesthetic complications: no ? ? ?No notable events documented. ? ?Last Vitals:  ?Vitals:  ? 09/13/21 0915 09/13/21 0930  ?BP: 139/74 118/78  ?Pulse: 62 68  ?Resp: 13 14  ?Temp:  36.6 ?C  ?SpO2: 100% 98%  ?  ?Last Pain:  ?Vitals:  ? 09/13/21 0930  ?TempSrc:   ?PainSc: 0-No pain  ? ? ?  ?  ?  ?  ?  ?  ? ?Willies Laviolette,W. EDMOND ? ? ? ? ?

## 2021-09-13 NOTE — Op Note (Signed)
? ? ?  Patient name: Matthew Odom MRN: [id number] DOB: [birth year] Sex: male ? ?09/13/2021 ?Pre-operative Diagnosis: non-maturing left radio-cephalic fistula ?Post-operative diagnosis:  Same ?Surgeon:  Annamarie Major ?Assistants:  Laurence Slate ?Procedure:   Revision of left radiocephalic fistula via branch ligation and elevation ?Anesthesia:  regional ?Blood Loss:  minimal ?Specimens:  none ? ?Findings:  multiple branches were ligated.  Vein was 5 mm ? ?Indications: This is a 51 year old gentleman with end-stage renal disease who previously had a left radiocephalic fistula which has not matured.  Vein mapping suggested an adequate sized fistula but multiple branches.  He comes in today for elevation and ligation of branches. ? ?Procedure:  The patient was identified in the holding area and taken to [facility]  The patient was then placed supine on the table. regional anesthesia was administered.  The patient was prepped and draped in the usual sterile fashion.  A time out was called and antibiotics were administered.  A PA was necessary to explain the procedure and assist with technical details. ? ?Ultrasound was used to evaluate the fistula from the wrist up to the elbow.  There were multiple branches.  I then made 2 longitudinal incision over top of the fistula.  The fistula is fully mobilized from the wrist up to the elbow.  There were multiple large diameter branches which were identified and ligated between silk ties.  The vein measures approximately 5 mm.  After all of the visible branches were ligated, I closed the subcutaneous tissue posterior to the fistula with interrupted 3-0 Vicryl.  4-0 Vicryl was used to close the skin followed by Dermabond.  The PA assisted with exposure of the vein and help with closure. ? ? ?Disposition: To PACU stable ? ? ?V. Annamarie Major, M.D., FACS ?[facility] ?Office: [phone] ?Pager:  [phone]  ?

## 2021-09-13 NOTE — Interval H&P Note (Signed)
History and Physical Interval Note: ? ?09/13/2021 ?7:27 AM ? ?[name]  has presented today for surgery, with the diagnosis of END STAGE RENAL DISEASE.  The various methods of treatment have been discussed with the patient and family. After consideration of risks, benefits and other options for treatment, the patient has consented to  Procedure(s): ?LIGATION OF COMPETING BRANCHES OF ARTERIOVENOUS FISTULA AND ELEVATION (Left) as a surgical intervention.  The patient's history has been reviewed, patient examined, no change in status, stable for surgery.  I have reviewed the patient's chart and labs.  Questions were answered to the patient's satisfaction.   ? ? ?Matthew Odom ? ? ?

## 2021-09-13 NOTE — Anesthesia Procedure Notes (Signed)
Anesthesia Regional Block: Supraclavicular block  ? ?Pre-Anesthetic Checklist: , timeout performed,  Correct Patient, Correct Site, Correct Laterality,  Correct Procedure, Correct Position, site marked,  Risks and benefits discussed,  Pre-op evaluation,  At surgeon's request and post-op pain management ? ?Laterality: Left ? ?Prep: Maximum Sterile Barrier Precautions used, Betadine     ?  ?Needles:  ?Injection technique: Single-shot ? ?Needle Type: Other   ? ? ?Needle Length: 9cm  ?Needle Gauge: 21  ? ? ? ?Additional Needles: ? ? ?Procedures:,,,, ultrasound used (permanent image in chart),,    ?Narrative:  ?Start time: 09/13/2021 7:03 AM ?End time: 09/13/2021 7:13 AM ?Injection made incrementally with aspirations every 5 mL. ? ?Performed by: Personally  ?Anesthesiologist: Roderic Palau, MD ? ? ? ? ?

## 2021-09-13 NOTE — Discharge Instructions (Signed)
° °  [facility] ° °Discharge Instructions ° °AV Fistula or Graft Surgery for Dialysis Access ° °Please refer to the following instructions for your post-procedure care. Your surgeon or physician assistant will discuss any changes with you. ° °Activity ° °You may drive the day following your surgery, if you are comfortable and no longer taking prescription pain medication. Resume full activity as the soreness in your incision resolves. ° °Bathing/Showering ° °You may shower after you go home. Keep your incision dry for 48 hours. Do not soak in a bathtub, hot tub, or swim until the incision heals completely. You may not shower if you have a hemodialysis catheter. ° °Incision Care ° °Clean your incision with mild soap and water after 48 hours. Pat the area dry with a clean towel. You do not need a bandage unless otherwise instructed. Do not apply any ointments or creams to your incision. You may have skin glue on your incision. Do not peel it off. It will come off on its own in about one week. Your arm may swell a bit after surgery. To reduce swelling use pillows to elevate your arm so it is above your heart. Your doctor will tell you if you need to lightly wrap your arm with an ACE bandage. ° °Diet ° °Resume your normal diet. There are not special food restrictions following this procedure. In order to heal from your surgery, it is CRITICAL to get adequate nutrition. Your body requires vitamins, minerals, and protein. Vegetables are the best source of vitamins and minerals. Vegetables also provide the perfect balance of protein. Processed food has little nutritional value, so try to avoid this. ° °Medications ° °Resume taking all of your medications. If your incision is causing pain, you may take over-the counter pain relievers such as acetaminophen (Tylenol). If you were prescribed a stronger pain medication, please be aware these medications can cause nausea and constipation. Prevent  nausea by taking the medication with a snack or meal. Avoid constipation by drinking plenty of fluids and eating foods with high amount of fiber, such as fruits, vegetables, and grains. Do not take Tylenol if you are taking prescription pain medications. ° ° ° ° °Follow up °Your surgeon may want to see you in the office following your access surgery. If so, this will be arranged at the time of your surgery. ° °Please call us immediately for any of the following conditions: ° °Increased pain, redness, drainage (pus) from your incision site °Fever of 101 degrees or higher °Severe or worsening pain at your incision site °Hand pain or numbness. ° °Reduce your risk of vascular disease: ° °Stop smoking. If you would like help, call [facility] at 1-800-QUIT-NOW (1-800-784-8669) or [place] at 336-586-4000 ° °Manage your cholesterol °Maintain a desired weight °Control your diabetes °Keep your blood pressure down ° °Dialysis ° °It will take several weeks to several months for your new dialysis access to be ready for use. Your surgeon will determine when it is OK to use it. Your nephrologist will continue to direct your dialysis. You can continue to use your Permcath until your new access is ready for use. ° °If you have any questions, please call the office at [phone]. ° °

## 2021-09-13 NOTE — Addendum Note (Signed)
Addendum  created 09/13/21 1530 by Griffin Dakin, CRNA  ? Intraprocedure Event edited  ?  ?

## 2021-09-14 ENCOUNTER — Encounter (HOSPITAL_COMMUNITY): Payer: Self-pay | Admitting: Surgery

## 2021-09-14 DIAGNOSIS — N186 End stage renal disease: Secondary | ICD-10-CM | POA: Diagnosis not present

## 2021-09-14 DIAGNOSIS — E876 Hypokalemia: Secondary | ICD-10-CM | POA: Diagnosis not present

## 2021-09-14 DIAGNOSIS — N2581 Secondary hyperparathyroidism of renal origin: Secondary | ICD-10-CM | POA: Diagnosis not present

## 2021-09-14 DIAGNOSIS — Z992 Dependence on renal dialysis: Secondary | ICD-10-CM | POA: Diagnosis not present

## 2021-09-16 ENCOUNTER — Ambulatory Visit: Payer: No Typology Code available for payment source | Admitting: Internal Medicine

## 2021-09-16 NOTE — Progress Notes (Deleted)
Name: Matthew Odom  MRN/ DOB: [id number], 13-Jul-1970   Age/ Sex: 51 y.o., male    PCP: Matthew Herter, NP   Reason for Endocrinology Evaluation: Type 2 Diabetes Mellitus     Date of Initial Endocrinology Visit: 06/19/2021    PATIENT IDENTIFIER: Matthew Odom is a 51 y.o. male with a past medical history of T2Dm, HTN, CHF, COPD . The patient presented for initial endocrinology clinic visit on 06/19/2021 for consultative assistance with his diabetes management.    HPI: Matthew Odom is accompanied by his spouse today   Diagnosed with DM > 20 yrs  Prior Medications tried/Intolerance: metformin,             Hemoglobin A1c has ranged from 7.0% in 2021, peaking at 13.5% in 05/2021.  On his initial visit to our clinic his A1c was 10.5% we continued Victoza, Basaglar and started him on prandial insulin with correction scale  SUBJECTIVE:   During the last visit (06/19/2021): A1c 10.5%, we continued Victoza and Basaglar and started prandial insulin  Today (09/16/21): Matthew Odom is here for a follow up on diabetes management. He checks his blood sugars *** times daily, preprandial to breakfast and ***. The patient has *** had hypoglycemic episodes since the last clinic visit, which typically occur *** x / - most often occuring ***. The patient is *** symptomatic with these episodes, with symptoms of {symptoms; hypoglycemia:[id number]}.   Since his last visit here he presented to the ED on 07/16/2021 for chest pain He is status post left arm AV fistula 07/17/2021 Presented to the ED again on 08/02/2021 for syncope HOME DIABETES REGIMEN: Basaglar 20 units daily  Victoza 1.8 mg daily  NovoLog 4 units 3 times daily before every meal Correction factor: NovoLog(BG -130/50)   Statin: yes ACE-I/ARB: No    METER DOWNLOAD SUMMARY: unable to download  30 day average 163 mg/dL    84 - 716   DIABETIC COMPLICATIONS: Microvascular complications:  ESRD on HD ( Tuesday, Thursday and Saturday )  Denies:   Last eye exam: Completed a while ago   Macrovascular complications:  CHF  Denies: CAD, PVD, CVA   PAST HISTORY: Past Medical History:  Past Medical History:  Diagnosis Date   Anxiety    Cocaine use    07/15/21- "last time was more than 10 years ago."   Complication of anesthesia    wakes up during surgery   COPD (chronic obstructive pulmonary disease) ([place])    Coronary artery disease    mild non obstructed   Diabetes mellitus without complication (HCC)    Dyspnea    Enlarged heart    ESRD (end stage renal disease) ([place])    ESRD (end stage renal disease) ([place])    TTHSAT   History of degenerative disc disease    Hyperlipidemia    Hypertension    NSTEMI (non-ST elevated myocardial infarction) ([place])    Pneumonia    Sciatic nerve pain    Sleep apnea    Stroke ([place])    no residual, x 3 last one was in 2020   Thoracic ascending aortic aneurysm ([place])    4.2 cm 08/03/21 CTA chest   Past Surgical History:  Past Surgical History:  Procedure Laterality Date   AV FISTULA PLACEMENT Left 07/17/2021   Procedure: CREATION  OF LEFT ARM RADIOCEPHALIC ARTERIOVENOUS (AV) FISTULA;  Surgeon: Serafina Mitchell, MD;  Location: [facility] OR;  Service: Vascular;  Laterality: Left;   CARDIAC CATHETERIZATION  IR FLUORO GUIDE CV LINE RIGHT  05/20/2021   IR US GUIDE VASC ACCESS RIGHT  05/20/2021   LIGATION OF COMPETING BRANCHES OF ARTERIOVENOUS FISTULA Left 09/13/2021   Procedure: LIGATION AND ELEVATION OF COMPETING BRANCHES OF LEFT ARM ARTERIOVENOUS FISTULA;  Surgeon: Serafina Mitchell, MD;  Location: [facility] OR;  Service: Vascular;  Laterality: Left;   NO PAST SURGERIES      Social History:  reports that he has been smoking cigarettes. He has a 20.00 pack-year smoking history. He has never used smokeless tobacco. He reports that he does not currently use alcohol. He reports that he does not currently use drugs after having used the following drugs: Cocaine. Family History:  Family History  Problem Relation  Age of Onset   Heart attack Mother    Hypertension Mother    Diabetes Mother    Heart disease Sister    Hypertension Sister    Hypertension Father    Emphysema Father      HOME MEDICATIONS: Allergies as of 09/16/2021       Reactions   Aspirin Hives   Nitroglycerin Other (See Comments)   Nitroglycerin paste or IV will make blood pressure go up and cause headaches. The nitroglycerin tablets do fine.        Medication List        Accurate as of Sep 16, 2021  7:05 AM. If you have any questions, ask your nurse or doctor.          acetaminophen 500 MG tablet Commonly known as: TYLENOL Take 500-1,000 mg by mouth every 6 (six) hours as needed (pain.).   albuterol (2.5 MG/3ML) 0.083% nebulizer solution Commonly known as: PROVENTIL Take 3 mLs (2.5 mg total) by nebulization every 6 (six) hours as needed for wheezing or shortness of breath.   albuterol 108 (90 Base) MCG/ACT inhaler Commonly known as: VENTOLIN HFA Inhale 2 puffs into the lungs every 6 (six) hours as needed.   amLODipine 10 MG tablet Commonly known as: NORVASC Take 1 tablet (10 mg total) by mouth daily.   atorvastatin 80 MG tablet Commonly known as: LIPITOR Take 1 tablet (80 mg total) by mouth daily.   Basaglar KwikPen 100 UNIT/ML Inject 20 Units into the skin daily. What changed:  how much to take when to take this additional instructions   budesonide-formoterol 160-4.5 MCG/ACT inhaler Commonly known as: SYMBICORT Inhale 2 puffs into the lungs daily.   Calcium 500 MG tablet Take 1 tablet by mouth 3 times daily   calcium acetate 667 MG capsule Commonly known as: PHOSLO Take 1 tablet by mouth three times a day with meals   calcium acetate 667 MG capsule Commonly known as: PHOSLO Take 2 capsules (1,334 mg total) by mouth 3 (three) times daily with meals   calcium carbonate 1250 (500 Ca) MG tablet Commonly known as: OS-CAL - dosed in mg of elemental calcium Take 1 tablet (500 mg of elemental  calcium total) by mouth 2 (two) times daily with a meal.   calcium carbonate 750 MG chewable tablet Commonly known as: TUMS EX Chew 2 tablets by mouth 3 (three) times daily with meals.   carvedilol 25 MG tablet Commonly known as: COREG Take 1 tablet (25 mg total) by mouth 2 (two) times daily with a meal.   doxazosin 8 MG tablet Commonly known as: CARDURA Take 1 tablet (8 mg total) by mouth daily. What changed: when to take this   HumaLOG KwikPen 100 UNIT/ML KwikPen Generic drug: insulin lispro  Use as directed into the skin max daily 30 units. What changed:  how much to take when to take this additional instructions   hydrALAZINE 100 MG tablet Commonly known as: APRESOLINE Take 1 tablet (100 mg total) by mouth 3 (three) times daily. Take 1 tablet by mouth three times daily What changed: additional instructions   Insulin Pen Needle 31G X 6 MM Misc Use as directed in the morning, at noon, in the evening, and at bedtime.   Misc. Devices Misc Please provide patient with insurance approved CPAP with the following settings:  autopap 15-20.  Large size Fisher[employer]Paykel Full Face Mask Forma mask and heated humidification.   nitroGLYCERIN 0.4 MG SL tablet Commonly known as: NITROSTAT Place 1 tablet (0.4 mg total) under the tongue every 5 (five) minutes as needed for up to 30 doses for chest pain.   oxyCODONE-acetaminophen 5-325 MG tablet Commonly known as: PERCOCET/ROXICET Take 1 tablet by mouth every 6 (six) hours as needed.   sertraline 25 MG tablet Commonly known as: ZOLOFT Take 1 tablet (25 mg total) by mouth daily.   tamsulosin 0.4 MG Caps capsule Commonly known as: FLOMAX Take 0.4 mg by mouth at bedtime.   Victoza 18 MG/3ML Sopn Generic drug: liraglutide Inject 1.8 mg into the skin daily.         ALLERGIES: Allergies  Allergen Reactions   Aspirin Hives   Nitroglycerin Other (See Comments)    Nitroglycerin paste or IV will make blood pressure go up and cause  headaches. The nitroglycerin tablets do fine.     REVIEW OF SYSTEMS: A comprehensive ROS was conducted with the patient and is negative except as per HPI     OBJECTIVE:   VITAL SIGNS: There were no vitals taken for this visit.   PHYSICAL EXAM:  General: Pt appears drowsy  Neck: General: Supple without adenopathy or carotid bruits. Thyroid: Thyroid size normal.  No goiter or nodules appreciated.   Lungs: Clear with good BS bilat with no rales, rhonchi, or wheezes  Heart: RRR   Extremities:  Lower extremities - No pretibial edema on the right, left lower extremity boot in place.  Neuro: Patient is drowsy and has been dozing off during the visit     DATA REVIEWED:  Lab Results  Component Value Date   HGBA1C 10.5 (A) 06/14/2021   HGBA1C 13.5 (H) 05/07/2021   HGBA1C 10.0 (A) 01/29/2021   Lab Results  Component Value Date   LDLCALC 126 (H) 01/29/2021   CREATININE 6.40 (H) 09/13/2021   Lab Results  Component Value Date   MICRALBCREAT 5,247 (H) 10/07/2019    Lab Results  Component Value Date   CHOL 220 (H) 01/29/2021   HDL 35 (L) 01/29/2021   LDLCALC 126 (H) 01/29/2021   TRIG 331 (H) 01/29/2021   CHOLHDL 6.3 (H) 01/29/2021        Latest Reference Range & Units Most Recent  ALDOSTERONE 0.0 - 30.0 ng/dL 9.5 01/03/18 09:10  PRA LC/MS/MS 0.167 - 5.380 ng/mL/hr 1.930 01/03/18 09:10  ALDO / PRA Ratio 0.0 - 30.0  4.9 01/03/18 09:10   ASSESSMENT / PLAN / RECOMMENDATIONS:   1) Type 2 Diabetes Mellitus, Poorly controlled, With ESRD on HD  complications - Most recent A1c of 10.5 %. Goal A1c < 7.0 %.     -We did discuss briefly microvascular complications of poorly controlled diabetes to include blindness, neuropathy, increased risk of amputations, unfortunately he already has end-stage renal disease but I did encourage him to continue  to work on optimizing his glucose control. -During his visit today he was very drowsy, he was actually dozing off during his visit, most of the  history was obtained from the wife.  It is unclear to me the cause of his drowsiness of note he did sleep from 10 PM until 8 AM this morning, he denies any alcohol use, of note the patient is on oxycodone.  I am not sure if this has anything to do with it -Did express my concern to the wife that with his drowsiness I am afraid that he is missing insulin doses which she does agree that he had missed insulin doses  -Perceives hypoglycemia at 100 mg/dL  , we discussed this is because of chronic hyperglycemia -I am going to adjust his insulin as below, he will also be provided with a correction factor  MEDICATIONS: Continue Victoza 1.8 mg daily Continue Basaglar 20 units daily Start NovoLog 4 units 3 times a day with meals Correction factor: NovoLog (BG -130/50)  EDUCATION / INSTRUCTIONS: BG monitoring instructions: Patient is instructed to check his blood sugars 3 times a day, before meals. Call [facility] if: BG persistently < 70 I reviewed the Rule of 15 for the treatment of hypoglycemia in detail with the patient. Literature supplied.   2) Diabetic complications:  Eye: Unknown to have known diabetic retinopathy.  Neuro/ Feet: Unknown to have known diabetic peripheral neuropathy. Renal: Patient does  have known baseline CKD. He is not on an ACEI/ARB at present.patient on hemodialysis     Follow-up in 3 months   Signed electronically by: Mack Guise, MD  [facility]  [facility] [address] [place], [place] [address] Phone: [phone] FAX: [phone]   CC: Matthew Herter, NP [place] [address] Phone: [phone]  Fax: [phone]    Return to Endocrinology clinic as below: Future Appointments  Date Time Provider [place]  09/16/2021 10:30 AM Maddax Palinkas, Melanie Crazier, MD [facility] None  10/07/2021 11:00 AM Serafina Mitchell, MD [facility]  10/11/2021  8:00 PM  Rigoberto Noel, MD [facility]  12/05/2021  3:00 PM Spero Geralds, MD [facility] None

## 2021-09-17 DIAGNOSIS — Z992 Dependence on renal dialysis: Secondary | ICD-10-CM | POA: Diagnosis not present

## 2021-09-17 DIAGNOSIS — E876 Hypokalemia: Secondary | ICD-10-CM | POA: Diagnosis not present

## 2021-09-17 DIAGNOSIS — N2581 Secondary hyperparathyroidism of renal origin: Secondary | ICD-10-CM | POA: Diagnosis not present

## 2021-09-17 DIAGNOSIS — N186 End stage renal disease: Secondary | ICD-10-CM | POA: Diagnosis not present

## 2021-09-18 ENCOUNTER — Other Ambulatory Visit: Payer: Self-pay

## 2021-09-18 DIAGNOSIS — S86012D Strain of left Achilles tendon, subsequent encounter: Secondary | ICD-10-CM | POA: Diagnosis not present

## 2021-09-19 NOTE — Telephone Encounter (Signed)
Pt saw Dr. Shearon Stalls for a visit 5/3 and inhalers were taken care of. Nothing further needed.

## 2021-09-21 DIAGNOSIS — E876 Hypokalemia: Secondary | ICD-10-CM | POA: Diagnosis not present

## 2021-09-21 DIAGNOSIS — N2581 Secondary hyperparathyroidism of renal origin: Secondary | ICD-10-CM | POA: Diagnosis not present

## 2021-09-21 DIAGNOSIS — N186 End stage renal disease: Secondary | ICD-10-CM | POA: Diagnosis not present

## 2021-09-21 DIAGNOSIS — Z992 Dependence on renal dialysis: Secondary | ICD-10-CM | POA: Diagnosis not present

## 2021-09-24 ENCOUNTER — Other Ambulatory Visit: Payer: Self-pay

## 2021-09-26 DIAGNOSIS — E876 Hypokalemia: Secondary | ICD-10-CM | POA: Diagnosis not present

## 2021-09-26 DIAGNOSIS — N186 End stage renal disease: Secondary | ICD-10-CM | POA: Diagnosis not present

## 2021-09-26 DIAGNOSIS — N2581 Secondary hyperparathyroidism of renal origin: Secondary | ICD-10-CM | POA: Diagnosis not present

## 2021-09-26 DIAGNOSIS — Z992 Dependence on renal dialysis: Secondary | ICD-10-CM | POA: Diagnosis not present

## 2021-09-28 DIAGNOSIS — Z992 Dependence on renal dialysis: Secondary | ICD-10-CM | POA: Diagnosis not present

## 2021-09-28 DIAGNOSIS — N2581 Secondary hyperparathyroidism of renal origin: Secondary | ICD-10-CM | POA: Diagnosis not present

## 2021-09-28 DIAGNOSIS — N186 End stage renal disease: Secondary | ICD-10-CM | POA: Diagnosis not present

## 2021-09-28 DIAGNOSIS — E876 Hypokalemia: Secondary | ICD-10-CM | POA: Diagnosis not present

## 2021-10-03 DIAGNOSIS — Z992 Dependence on renal dialysis: Secondary | ICD-10-CM | POA: Diagnosis not present

## 2021-10-03 DIAGNOSIS — N2581 Secondary hyperparathyroidism of renal origin: Secondary | ICD-10-CM | POA: Diagnosis not present

## 2021-10-03 DIAGNOSIS — E876 Hypokalemia: Secondary | ICD-10-CM | POA: Diagnosis not present

## 2021-10-03 DIAGNOSIS — D631 Anemia in chronic kidney disease: Secondary | ICD-10-CM | POA: Diagnosis not present

## 2021-10-03 DIAGNOSIS — E1129 Type 2 diabetes mellitus with other diabetic kidney complication: Secondary | ICD-10-CM | POA: Diagnosis not present

## 2021-10-03 DIAGNOSIS — N186 End stage renal disease: Secondary | ICD-10-CM | POA: Diagnosis not present

## 2021-10-07 ENCOUNTER — Encounter: Payer: No Typology Code available for payment source | Admitting: Surgery

## 2021-10-08 DIAGNOSIS — N186 End stage renal disease: Secondary | ICD-10-CM | POA: Diagnosis not present

## 2021-10-08 DIAGNOSIS — E876 Hypokalemia: Secondary | ICD-10-CM | POA: Diagnosis not present

## 2021-10-08 DIAGNOSIS — D631 Anemia in chronic kidney disease: Secondary | ICD-10-CM | POA: Diagnosis not present

## 2021-10-08 DIAGNOSIS — Z992 Dependence on renal dialysis: Secondary | ICD-10-CM | POA: Diagnosis not present

## 2021-10-08 DIAGNOSIS — N2581 Secondary hyperparathyroidism of renal origin: Secondary | ICD-10-CM | POA: Diagnosis not present

## 2021-10-10 DIAGNOSIS — D631 Anemia in chronic kidney disease: Secondary | ICD-10-CM | POA: Diagnosis not present

## 2021-10-10 DIAGNOSIS — N2581 Secondary hyperparathyroidism of renal origin: Secondary | ICD-10-CM | POA: Diagnosis not present

## 2021-10-10 DIAGNOSIS — E876 Hypokalemia: Secondary | ICD-10-CM | POA: Diagnosis not present

## 2021-10-10 DIAGNOSIS — Z992 Dependence on renal dialysis: Secondary | ICD-10-CM | POA: Diagnosis not present

## 2021-10-10 DIAGNOSIS — N186 End stage renal disease: Secondary | ICD-10-CM | POA: Diagnosis not present

## 2021-10-11 ENCOUNTER — Ambulatory Visit (HOSPITAL_BASED_OUTPATIENT_CLINIC_OR_DEPARTMENT_OTHER): Payer: Medicare Other | Attending: Internal Medicine | Admitting: Pulmonary Disease

## 2021-10-11 DIAGNOSIS — Z9989 Dependence on other enabling machines and devices: Secondary | ICD-10-CM | POA: Insufficient documentation

## 2021-10-11 DIAGNOSIS — G4733 Obstructive sleep apnea (adult) (pediatric): Secondary | ICD-10-CM | POA: Diagnosis not present

## 2021-10-12 DIAGNOSIS — D631 Anemia in chronic kidney disease: Secondary | ICD-10-CM | POA: Diagnosis not present

## 2021-10-12 DIAGNOSIS — E876 Hypokalemia: Secondary | ICD-10-CM | POA: Diagnosis not present

## 2021-10-12 DIAGNOSIS — Z992 Dependence on renal dialysis: Secondary | ICD-10-CM | POA: Diagnosis not present

## 2021-10-12 DIAGNOSIS — N2581 Secondary hyperparathyroidism of renal origin: Secondary | ICD-10-CM | POA: Diagnosis not present

## 2021-10-12 DIAGNOSIS — N186 End stage renal disease: Secondary | ICD-10-CM | POA: Diagnosis not present

## 2021-10-15 DIAGNOSIS — N186 End stage renal disease: Secondary | ICD-10-CM | POA: Diagnosis not present

## 2021-10-15 DIAGNOSIS — N2581 Secondary hyperparathyroidism of renal origin: Secondary | ICD-10-CM | POA: Diagnosis not present

## 2021-10-15 DIAGNOSIS — D631 Anemia in chronic kidney disease: Secondary | ICD-10-CM | POA: Diagnosis not present

## 2021-10-15 DIAGNOSIS — E876 Hypokalemia: Secondary | ICD-10-CM | POA: Diagnosis not present

## 2021-10-15 DIAGNOSIS — Z992 Dependence on renal dialysis: Secondary | ICD-10-CM | POA: Diagnosis not present

## 2021-10-17 DIAGNOSIS — Z9989 Dependence on other enabling machines and devices: Secondary | ICD-10-CM

## 2021-10-17 DIAGNOSIS — Z992 Dependence on renal dialysis: Secondary | ICD-10-CM | POA: Diagnosis not present

## 2021-10-17 DIAGNOSIS — N186 End stage renal disease: Secondary | ICD-10-CM | POA: Diagnosis not present

## 2021-10-17 DIAGNOSIS — N2581 Secondary hyperparathyroidism of renal origin: Secondary | ICD-10-CM | POA: Diagnosis not present

## 2021-10-17 DIAGNOSIS — G4733 Obstructive sleep apnea (adult) (pediatric): Secondary | ICD-10-CM | POA: Diagnosis not present

## 2021-10-17 DIAGNOSIS — E876 Hypokalemia: Secondary | ICD-10-CM | POA: Diagnosis not present

## 2021-10-17 DIAGNOSIS — D631 Anemia in chronic kidney disease: Secondary | ICD-10-CM | POA: Diagnosis not present

## 2021-10-17 NOTE — Procedures (Signed)
Patient Name: Matthew Odom, Matthew Odom Date: 10/11/2021 Gender: Male D.O.B: [birth year] Age (years): 10 Referring Provider: Lenice Llamas MD Height (inches): 71 Interpreting Physician: Kara Mead MD, ABSM Weight (lbs): 255 RPSGT: Jorge Ny BMI: 36 MRN: [id number] Neck Size: 18.00 <br> <br> CLINICAL INFORMATION The patient is referred for a BiPAP titration to treat sleep apnea.    Date of NPSG: 12/2019 AHI 74/h corrected by CPAP 19 cm  SLEEP STUDY TECHNIQUE As per the AASM Manual for the Scoring of Sleep and Associated Events v2.3 (April 2016) with a hypopnea requiring 4% desaturations.  The channels recorded and monitored were frontal, central and occipital EEG, electrooculogram (EOG), submentalis EMG (chin), nasal and oral airflow, thoracic and abdominal wall motion, anterior tibialis EMG, snore microphone, electrocardiogram, and pulse oximetry. Bilevel positive airway pressure (BPAP) was initiated at the beginning of the study and titrated to treat sleep-disordered breathing.  MEDICATIONS Medications self-administered by patient taken the night of the study : AMLODIPINE, CALCIUM, CALCIUM ACETATE, CARVEDILOL, DOXAZOSIN MESYLATE, HYDRALAZINE, SERTRALINE, TAMSULOSIN HYDROCHLORIDE  RESPIRATORY PARAMETERS Optimal IPAP Pressure (cm): 18 AHI at Optimal Pressure (/hr) 0 Optimal EPAP Pressure (cm): 14   Overall Minimal O2 (%): 76.0 Minimal O2 at Optimal Pressure (%): 92.0 SLEEP ARCHITECTURE Start Time: 9:45:29 PM Stop Time: 5:25:07 AM Total Time (min): 459.6 Total Sleep Time (min): 447 Sleep Latency (min): 2.3 Sleep Efficiency (%): 97.3% REM Latency (min): 83.0 WASO (min): 10.4 Stage N1 (%): 1.3% Stage N2 (%): 59.8% Stage N3 (%): 0.0% Stage R (%): 38.8 Supine (%): 12.04 Arousal Index (/hr): 12.2     CARDIAC DATA The 2 lead EKG demonstrated sinus rhythm. The mean heart rate was 65.2 beats per minute. Other EKG findings include: PVCs.   LEG MOVEMENT DATA The total Periodic Limb  Movements of Sleep (PLMS) were 0. The PLMS index was 0.0. A PLMS index of <15 is considered normal in adults.  IMPRESSIONS - An optimal BiPAP pressure was selected for this patient ( 18 /14 cm of water) . He could not tolerate high pressures on CPAP & central apneas emerged hence changed to BiPAP - Central sleep apnea was noted during this titration (CAI = 4.6/h). Hence he was changed to BiPAP ST with back up RR of 12 - Severe oxygen desaturations were observed during this titration (min O2 = 76.0%). - The patient snored with moderate snoring volume. - 2-lead EKG demonstrated: PVCs - Clinically significant periodic limb movements were not noted during this study. Arousals associated with PLMs were rare.   DIAGNOSIS - Obstructive Sleep Apnea (G47.33) - Treatment emergent central apneas   RECOMMENDATIONS - Trial of BiPAP-ST therapy on 18/14 cm H2O with a Large size Fisher&Paykel Full Face Ev[place] Full mask and heated humidification. Back up RR of 12 can be set. - Avoid alcohol, sedatives and other CNS depressants that may worsen sleep apnea and disrupt normal sleep architecture. - Sleep hygiene should be reviewed to assess factors that may improve sleep quality. - Weight management and regular exercise should be initiated or continued. - Return to Sleep Center for re-evaluation after 6 weeks of therapy with review of download for central apneas.    Kara Mead MD Board Certified in [place]

## 2021-10-18 ENCOUNTER — Telehealth: Payer: Self-pay | Admitting: Internal Medicine

## 2021-10-18 DIAGNOSIS — G4733 Obstructive sleep apnea (adult) (pediatric): Secondary | ICD-10-CM

## 2021-10-18 NOTE — Telephone Encounter (Signed)
Please prescribe Trial of BiPAP-ST therapy on 18/14 cm H2O with a Large size Fisher&Paykel Full Face Evora Full mask and heated humidification. Back up RR of 12.

## 2021-10-21 NOTE — Telephone Encounter (Signed)
ATC Phone was answered by child. Child stated pt would call us back.

## 2021-10-23 ENCOUNTER — Ambulatory Visit (INDEPENDENT_AMBULATORY_CARE_PROVIDER_SITE_OTHER): Payer: Medicare Other | Admitting: Surgery

## 2021-10-23 VITALS — BP 151/95 | HR 71 | Temp 98.6°F | Resp 20 | Ht 71.0 in | Wt 258.0 lb

## 2021-10-23 DIAGNOSIS — Z992 Dependence on renal dialysis: Secondary | ICD-10-CM

## 2021-10-23 DIAGNOSIS — N186 End stage renal disease: Secondary | ICD-10-CM

## 2021-10-23 NOTE — Progress Notes (Signed)
Patient name: Matthew Odom MRN: [id number] DOB: 1970/11/27 Sex: male  REASON FOR VISIT:     Post op  HISTORY OF PRESENT ILLNESS:   Matthew Odom is a 51 y.o. male with a left radiocephalic fistula that was created on 07/17/2021.  On 09/13/2021, he underwent branch ligation and elevation.  He is back today for follow-up.  CURRENT MEDICATIONS:    Current Outpatient Medications  Medication Sig Dispense Refill   acetaminophen (TYLENOL) 500 MG tablet Take 500-1,000 mg by mouth every 6 (six) hours as needed (pain.).     albuterol (PROVENTIL) (2.5 MG/3ML) 0.083% nebulizer solution Take 3 mLs (2.5 mg total) by nebulization every 6 (six) hours as needed for wheezing or shortness of breath. 150 mL 2   albuterol (VENTOLIN HFA) 108 (90 Base) MCG/ACT inhaler Inhale 2 puffs into the lungs every 6 (six) hours as needed. 18 g 5   amLODipine (NORVASC) 10 MG tablet Take 1 tablet (10 mg total) by mouth daily. 30 tablet 2   budesonide-formoterol (SYMBICORT) 160-4.5 MCG/ACT inhaler Inhale 2 puffs into the lungs daily. 10.2 g 5   Calcium 500 MG tablet Take 1 tablet by mouth 3 times daily 90 tablet 2   calcium acetate (PHOSLO) 667 MG capsule Take 1 tablet by mouth three times a day with meals 90 capsule 3   calcium acetate (PHOSLO) 667 MG capsule Take 2 capsules (1,334 mg total) by mouth 3 (three) times daily with meals 540 capsule 3   calcium carbonate (TUMS EX) 750 MG chewable tablet Chew 2 tablets by mouth 3 (three) times daily with meals.     doxazosin (CARDURA) 8 MG tablet Take 1 tablet (8 mg total) by mouth daily. (Patient taking differently: Take 8 mg by mouth at bedtime.) 30 tablet 2   Insulin Glargine (BASAGLAR KWIKPEN) 100 UNIT/ML Inject 20 Units into the skin daily. (Patient taking differently: Inject 10 Units into the skin 2 (two) times daily. \) 30 mL 3   insulin lispro (HUMALOG KWIKPEN) 100 UNIT/ML KwikPen Use as directed into the skin max daily 30 units. (Patient  taking differently: 4-8 Units See admin instructions. Sliding scale  Take 4 units daily then apply sliding scale 201-250=4 units 251-300=6 units 301-350=8 units 351-400=10 units  if his blood glucose is over  Use as directed into the skin max daily 30 units.) 30 mL 4   Insulin Pen Needle 31G X 6 MM MISC Use as directed in the morning, at noon, in the evening, and at bedtime. 400 each 3   liraglutide (VICTOZA) 18 MG/3ML SOPN Inject 1.8 mg into the skin daily. 27 mL 3   Misc. Devices MISC Please provide patient with insurance approved CPAP with the following settings:  autopap 15-20.  Large size [employer] Full Face Mask Forma mask and heated humidification. (Patient taking differently: Please provide patient with insurance approved CPAP with the following settings:  autopap 15-20.  Large size [employer] Full Face Mask Forma mask and heated humidification.) 1 each 0   nitroGLYCERIN (NITROSTAT) 0.4 MG SL tablet Place 1 tablet (0.4 mg total) under the tongue every 5 (five) minutes as needed for up to 30 doses for chest pain. 30 tablet 0   oxyCODONE-acetaminophen (PERCOCET/ROXICET) 5-325 MG tablet Take 1 tablet by mouth every 6 (six) hours as needed. 20 tablet 0   tamsulosin (FLOMAX) 0.4 MG CAPS capsule Take 1 capsule (0.4 mg total) by mouth at bedtime. 30 capsule 2   tamsulosin (FLOMAX) 0.4 MG CAPS capsule Take 0.4  mg by mouth at bedtime.     atorvastatin (LIPITOR) 80 MG tablet Take 1 tablet (80 mg total) by mouth daily. 30 tablet 2   calcium carbonate (OS-CAL - DOSED IN MG OF ELEMENTAL CALCIUM) 1250 (500 Ca) MG tablet Take 1 tablet (500 mg of elemental calcium total) by mouth 2 (two) times daily with a meal. (Patient not taking: Reported on 09/10/2021) 60 tablet 2   carvedilol (COREG) 25 MG tablet Take 1 tablet (25 mg total) by mouth 2 (two) times daily with a meal. 60 tablet 2   hydrALAZINE (APRESOLINE) 100 MG tablet Take 1 tablet (100 mg total) by mouth 3 (three) times daily. Take 1 tablet  by mouth three times daily (Patient taking differently: Take 100 mg by mouth 3 (three) times daily.) 90 tablet 2   sertraline (ZOLOFT) 25 MG tablet Take 1 tablet (25 mg total) by mouth daily. 30 tablet 0   No current facility-administered medications for this visit.    REVIEW OF SYSTEMS:   [X]  denotes positive finding, [ ]  denotes negative finding Cardiac  Comments:  Chest pain or chest pressure:    Shortness of breath upon exertion:    Short of breath when lying flat:    Irregular heart rhythm:    Constitutional    Fever or chills:      PHYSICAL EXAM:   Vitals:   10/23/21 0901  BP: (!) 151/95  Pulse: 71  Resp: 20  Temp: 98.6 F (37 C)  TempSrc: Temporal  SpO2: 98%  Weight: 258 lb (117 kg)  Height: 5\' 11"  (1.803 m)    GENERAL: The patient is a well-nourished male, in no acute distress. The vital signs are documented above. CARDIOVASCULAR: There is a regular rate and rhythm. PULMONARY: Non-labored respirations Excellent thrill within fistula.  All incisions are healed  STUDIES:      MEDICAL ISSUES:   Status post left radiocephalic fistula and elevation with branch ligation: He has an excellent thrill within his fistula.  His fistula is now ready for use  Annamarie Major, IV, MD, FACS [facility] [phone] Pager [phone]

## 2021-10-24 DIAGNOSIS — N2581 Secondary hyperparathyroidism of renal origin: Secondary | ICD-10-CM | POA: Diagnosis not present

## 2021-10-24 DIAGNOSIS — Z992 Dependence on renal dialysis: Secondary | ICD-10-CM | POA: Diagnosis not present

## 2021-10-24 DIAGNOSIS — D631 Anemia in chronic kidney disease: Secondary | ICD-10-CM | POA: Diagnosis not present

## 2021-10-24 DIAGNOSIS — E876 Hypokalemia: Secondary | ICD-10-CM | POA: Diagnosis not present

## 2021-10-24 DIAGNOSIS — N186 End stage renal disease: Secondary | ICD-10-CM | POA: Diagnosis not present

## 2021-10-24 NOTE — Addendum Note (Signed)
Addended by: Gavin Potters R on: 10/24/2021 02:53 PM   Modules accepted: Orders

## 2021-10-24 NOTE — Telephone Encounter (Signed)
Spoke with pt and notified him that I placed order for Bi Pap. Pt stated understanding. Nothing further needed at this time.

## 2021-10-26 DIAGNOSIS — E876 Hypokalemia: Secondary | ICD-10-CM | POA: Diagnosis not present

## 2021-10-26 DIAGNOSIS — D631 Anemia in chronic kidney disease: Secondary | ICD-10-CM | POA: Diagnosis not present

## 2021-10-26 DIAGNOSIS — Z992 Dependence on renal dialysis: Secondary | ICD-10-CM | POA: Diagnosis not present

## 2021-10-26 DIAGNOSIS — N186 End stage renal disease: Secondary | ICD-10-CM | POA: Diagnosis not present

## 2021-10-26 DIAGNOSIS — N2581 Secondary hyperparathyroidism of renal origin: Secondary | ICD-10-CM | POA: Diagnosis not present

## 2021-10-29 DIAGNOSIS — N186 End stage renal disease: Secondary | ICD-10-CM | POA: Diagnosis not present

## 2021-10-29 DIAGNOSIS — Z992 Dependence on renal dialysis: Secondary | ICD-10-CM | POA: Diagnosis not present

## 2021-10-29 DIAGNOSIS — E876 Hypokalemia: Secondary | ICD-10-CM | POA: Diagnosis not present

## 2021-10-29 DIAGNOSIS — N2581 Secondary hyperparathyroidism of renal origin: Secondary | ICD-10-CM | POA: Diagnosis not present

## 2021-10-29 DIAGNOSIS — D631 Anemia in chronic kidney disease: Secondary | ICD-10-CM | POA: Diagnosis not present

## 2021-10-31 DIAGNOSIS — D631 Anemia in chronic kidney disease: Secondary | ICD-10-CM | POA: Diagnosis not present

## 2021-10-31 DIAGNOSIS — Z992 Dependence on renal dialysis: Secondary | ICD-10-CM | POA: Diagnosis not present

## 2021-10-31 DIAGNOSIS — N186 End stage renal disease: Secondary | ICD-10-CM | POA: Diagnosis not present

## 2021-10-31 DIAGNOSIS — N2581 Secondary hyperparathyroidism of renal origin: Secondary | ICD-10-CM | POA: Diagnosis not present

## 2021-10-31 DIAGNOSIS — E876 Hypokalemia: Secondary | ICD-10-CM | POA: Diagnosis not present

## 2021-11-02 DIAGNOSIS — E1129 Type 2 diabetes mellitus with other diabetic kidney complication: Secondary | ICD-10-CM | POA: Diagnosis not present

## 2021-11-02 DIAGNOSIS — Z992 Dependence on renal dialysis: Secondary | ICD-10-CM | POA: Diagnosis not present

## 2021-11-02 DIAGNOSIS — N186 End stage renal disease: Secondary | ICD-10-CM | POA: Diagnosis not present

## 2021-11-05 DIAGNOSIS — N2581 Secondary hyperparathyroidism of renal origin: Secondary | ICD-10-CM | POA: Diagnosis not present

## 2021-11-05 DIAGNOSIS — N186 End stage renal disease: Secondary | ICD-10-CM | POA: Diagnosis not present

## 2021-11-05 DIAGNOSIS — E876 Hypokalemia: Secondary | ICD-10-CM | POA: Diagnosis not present

## 2021-11-05 DIAGNOSIS — Z992 Dependence on renal dialysis: Secondary | ICD-10-CM | POA: Diagnosis not present

## 2021-11-07 DIAGNOSIS — Z992 Dependence on renal dialysis: Secondary | ICD-10-CM | POA: Diagnosis not present

## 2021-11-07 DIAGNOSIS — E876 Hypokalemia: Secondary | ICD-10-CM | POA: Diagnosis not present

## 2021-11-07 DIAGNOSIS — N2581 Secondary hyperparathyroidism of renal origin: Secondary | ICD-10-CM | POA: Diagnosis not present

## 2021-11-07 DIAGNOSIS — N186 End stage renal disease: Secondary | ICD-10-CM | POA: Diagnosis not present

## 2021-11-11 ENCOUNTER — Other Ambulatory Visit: Payer: Self-pay

## 2021-11-11 ENCOUNTER — Observation Stay (HOSPITAL_COMMUNITY)
Admission: EM | Admit: 2021-11-11 | Discharge: 2021-11-13 | Disposition: A | Discharge status: Home / Self Care | Payer: Medicare Other | Attending: Internal Medicine | Admitting: Internal Medicine

## 2021-11-11 ENCOUNTER — Emergency Department (HOSPITAL_COMMUNITY): Payer: Medicare Other

## 2021-11-11 ENCOUNTER — Encounter (HOSPITAL_COMMUNITY): Payer: Self-pay

## 2021-11-11 DIAGNOSIS — J449 Chronic obstructive pulmonary disease, unspecified: Secondary | ICD-10-CM | POA: Diagnosis not present

## 2021-11-11 DIAGNOSIS — I5043 Acute on chronic combined systolic (congestive) and diastolic (congestive) heart failure: Secondary | ICD-10-CM | POA: Insufficient documentation

## 2021-11-11 DIAGNOSIS — N186 End stage renal disease: Secondary | ICD-10-CM | POA: Diagnosis not present

## 2021-11-11 DIAGNOSIS — R079 Chest pain, unspecified: Secondary | ICD-10-CM | POA: Diagnosis not present

## 2021-11-11 DIAGNOSIS — R000 Tachycardia, unspecified: Secondary | ICD-10-CM | POA: Diagnosis not present

## 2021-11-11 DIAGNOSIS — I471 Supraventricular tachycardia, unspecified: Secondary | ICD-10-CM | POA: Diagnosis present

## 2021-11-11 DIAGNOSIS — Z8673 Personal history of transient ischemic attack (TIA), and cerebral infarction without residual deficits: Secondary | ICD-10-CM | POA: Insufficient documentation

## 2021-11-11 DIAGNOSIS — R0789 Other chest pain: Secondary | ICD-10-CM | POA: Diagnosis not present

## 2021-11-11 DIAGNOSIS — F17210 Nicotine dependence, cigarettes, uncomplicated: Secondary | ICD-10-CM | POA: Insufficient documentation

## 2021-11-11 DIAGNOSIS — Z79899 Other long term (current) drug therapy: Secondary | ICD-10-CM | POA: Insufficient documentation

## 2021-11-11 DIAGNOSIS — I152 Hypertension secondary to endocrine disorders: Secondary | ICD-10-CM | POA: Diagnosis present

## 2021-11-11 DIAGNOSIS — Z7985 Long-term (current) use of injectable non-insulin antidiabetic drugs: Secondary | ICD-10-CM | POA: Diagnosis not present

## 2021-11-11 DIAGNOSIS — E782 Mixed hyperlipidemia: Secondary | ICD-10-CM

## 2021-11-11 DIAGNOSIS — I10 Essential (primary) hypertension: Secondary | ICD-10-CM | POA: Diagnosis present

## 2021-11-11 DIAGNOSIS — Z720 Tobacco use: Secondary | ICD-10-CM | POA: Diagnosis present

## 2021-11-11 DIAGNOSIS — IMO0001 Reserved for inherently not codable concepts without codable children: Secondary | ICD-10-CM | POA: Diagnosis present

## 2021-11-11 DIAGNOSIS — Z794 Long term (current) use of insulin: Secondary | ICD-10-CM | POA: Diagnosis not present

## 2021-11-11 DIAGNOSIS — Z7951 Long term (current) use of inhaled steroids: Secondary | ICD-10-CM | POA: Diagnosis not present

## 2021-11-11 DIAGNOSIS — G4733 Obstructive sleep apnea (adult) (pediatric): Secondary | ICD-10-CM

## 2021-11-11 DIAGNOSIS — R7989 Other specified abnormal findings of blood chemistry: Secondary | ICD-10-CM | POA: Diagnosis present

## 2021-11-11 DIAGNOSIS — I2510 Atherosclerotic heart disease of native coronary artery without angina pectoris: Secondary | ICD-10-CM | POA: Diagnosis not present

## 2021-11-11 DIAGNOSIS — E1122 Type 2 diabetes mellitus with diabetic chronic kidney disease: Secondary | ICD-10-CM

## 2021-11-11 DIAGNOSIS — R778 Other specified abnormalities of plasma proteins: Secondary | ICD-10-CM | POA: Diagnosis not present

## 2021-11-11 DIAGNOSIS — I132 Hypertensive heart and chronic kidney disease with heart failure and with stage 5 chronic kidney disease, or end stage renal disease: Secondary | ICD-10-CM | POA: Diagnosis not present

## 2021-11-11 DIAGNOSIS — Z992 Dependence on renal dialysis: Secondary | ICD-10-CM | POA: Diagnosis not present

## 2021-11-11 LAB — CBC
HCT: 38.1 % — ABNORMAL LOW (ref 39.0–52.0)
Hemoglobin: 12.5 g/dL — ABNORMAL LOW (ref 13.0–17.0)
MCH: 28.4 pg (ref 26.0–34.0)
MCHC: 32.8 g/dL (ref 30.0–36.0)
MCV: 86.6 fL (ref 80.0–100.0)
Platelets: 235 10*3/uL (ref 150–400)
RBC: 4.4 MIL/uL (ref 4.22–5.81)
RDW: 14 % (ref 11.5–15.5)
WBC: 7.4 10*3/uL (ref 4.0–10.5)
nRBC: 0 % (ref 0.0–0.2)

## 2021-11-11 LAB — BASIC METABOLIC PANEL
Anion gap: 12 (ref 5–15)
BUN: 55 mg/dL — ABNORMAL HIGH (ref 6–20)
CO2: 21 mmol/L — ABNORMAL LOW (ref 22–32)
Calcium: 7.5 mg/dL — ABNORMAL LOW (ref 8.9–10.3)
Chloride: 106 mmol/L (ref 98–111)
Creatinine, Ser: 4.99 mg/dL — ABNORMAL HIGH (ref 0.61–1.24)
GFR, Estimated: 13 mL/min — ABNORMAL LOW (ref >60)
Glucose, Bld: 143 mg/dL — ABNORMAL HIGH (ref 70–99)
Potassium: 4.1 mmol/L (ref 3.5–5.1)
Sodium: 139 mmol/L (ref 135–145)

## 2021-11-11 LAB — MAGNESIUM: Magnesium: 1.8 mg/dL (ref 1.7–2.4)

## 2021-11-11 LAB — TROPONIN I (HIGH SENSITIVITY): Troponin I (High Sensitivity): 54 ng/L — ABNORMAL HIGH (ref <18)

## 2021-11-11 NOTE — ED Provider Notes (Signed)
[facility] EMERGENCY DEPARTMENT Provider Note   CSN: [id number] Arrival date & time: 11/11/21  2059     History {Add pertinent medical, surgical, social history, OB history to HPI:1} Chief Complaint  Patient presents with   Chest Pain    Matthew Odom is a 51 y.o. male.  The history is provided by the patient and medical records.  Chest Pain  51 year old male with history of end-stage renal disease on hemodialysis, hyperlipidemia, LVH, nonischemic cardiomyopathy, DM 2, presenting to the ED with chest pain.  States around 730 this evening he started having some chest discomfort while walking his daughter across the parking lot to see her mom at work.  He states he felt his heart racing and like a pressure in the center of his chest.  He did feel short of breath and lightheaded so he sat down.  He did not lose consciousness.  EMS was called and arrived on scene, found to be in SVT with a rate around 160.  He failed vagal maneuvers and was given 6 mg adenosine with conversion to normal sinus rhythm.  Currently, states he feels back to normal and has no chest pain or shortness of breath.  He did miss dialysis on Saturday due to transportation issues, is scheduled for dialysis at 7:30 AM tomorrow morning.  Home Medications Prior to Admission medications   Medication Sig Start Date End Date Taking? Authorizing Provider  acetaminophen (TYLENOL) 500 MG tablet Take 500-1,000 mg by mouth every 6 (six) hours as needed (pain.).    [provider]  albuterol (PROVENTIL) (2.5 MG/3ML) 0.083% nebulizer solution Take 3 mLs (2.5 mg total) by nebulization every 6 (six) hours as needed for wheezing or shortness of breath. 06/14/21   Spero Geralds, MD  albuterol (VENTOLIN HFA) 108 (90 Base) MCG/ACT inhaler Inhale 2 puffs into the lungs every 6 (six) hours as needed. 06/14/21   Spero Geralds, MD  amLODipine (NORVASC) 10 MG tablet Take 1 tablet (10 mg total) by mouth daily. 05/10/21  10/24/21  [place] Donnamarie Poag, DO  atorvastatin (LIPITOR) 80 MG tablet Take 1 tablet (80 mg total) by mouth daily. 05/10/21 09/13/21  [place] Donnamarie Poag, DO  budesonide-formoterol (SYMBICORT) 160-4.5 MCG/ACT inhaler Inhale 2 puffs into the lungs daily. 09/04/21   Spero Geralds, MD  Calcium 500 MG tablet Take 1 tablet by mouth 3 times daily 05/14/21   Justin Mend, MD  calcium acetate (PHOSLO) 667 MG capsule Take 1 tablet by mouth three times a day with meals 06/13/21     calcium acetate (PHOSLO) 667 MG capsule Take 2 capsules (1,334 mg total) by mouth 3 (three) times daily with meals 06/25/21     calcium carbonate (OS-CAL - DOSED IN MG OF ELEMENTAL CALCIUM) 1250 (500 Ca) MG tablet Take 1 tablet (500 mg of elemental calcium total) by mouth 2 (two) times daily with a meal. Patient not taking: Reported on 09/10/2021 05/10/21 08/08/21  [place] Donnamarie Poag, DO  calcium carbonate (TUMS EX) 750 MG chewable tablet Chew 2 tablets by mouth 3 (three) times daily with meals.    [provider]  carvedilol (COREG) 25 MG tablet Take 1 tablet (25 mg total) by mouth 2 (two) times daily with a meal. 05/10/21 09/13/21  [place] Donnamarie Poag, DO  doxazosin (CARDURA) 8 MG tablet Take 1 tablet (8 mg total) by mouth daily. Patient taking differently: Take 8 mg by mouth at bedtime. 05/11/21 10/24/21  [place] Eric J, DO  hydrALAZINE (  APRESOLINE) 100 MG tablet Take 1 tablet (100 mg total) by mouth 3 (three) times daily. Take 1 tablet by mouth three times daily Patient taking differently: Take 100 mg by mouth 3 (three) times daily. 05/10/21 09/13/21  [place] Donnamarie Poag, DO  Insulin Glargine (BASAGLAR KWIKPEN) 100 UNIT/ML Inject 20 Units into the skin daily. Patient taking differently: Inject 10 Units into the skin 2 (two) times daily. \ 06/19/21   Shamleffer, Melanie Crazier, MD  insulin lispro (HUMALOG KWIKPEN) 100 UNIT/ML KwikPen Use as directed into the skin max daily 30 units. Patient taking differently: 4-8 Units See admin instructions. Sliding  scale  Take 4 units daily then apply sliding scale 201-250=4 units 251-300=6 units 301-350=8 units 351-400=10 units  if his blood glucose is over  Use as directed into the skin max daily 30 units. 07/23/21   Shamleffer, Melanie Crazier, MD  Insulin Pen Needle 31G X 6 MM MISC Use as directed in the morning, at noon, in the evening, and at bedtime. 06/19/21   Shamleffer, Melanie Crazier, MD  liraglutide (VICTOZA) 18 MG/3ML SOPN Inject 1.8 mg into the skin daily. 06/19/21   Shamleffer, Melanie Crazier, MD  Misc. Devices MISC Please provide patient with insurance approved CPAP with the following settings:  autopap 15-20.  Large size [employer] Full Face Mask Forma mask and heated humidification. Patient taking differently: Please provide patient with insurance approved CPAP with the following settings:  autopap 15-20.  Large size [employer] Full Face Mask Forma mask and heated humidification. 12/25/19   [name], NP  nitroGLYCERIN (NITROSTAT) 0.4 MG SL tablet Place 1 tablet (0.4 mg total) under the tongue every 5 (five) minutes as needed for up to 30 doses for chest pain. 07/16/21   Wyvonnia Dusky, MD  oxyCODONE-acetaminophen (PERCOCET/ROXICET) 5-325 MG tablet Take 1 tablet by mouth every 6 (six) hours as needed. 09/13/21   Ulyses Amor, PA-C  sertraline (ZOLOFT) 25 MG tablet Take 1 tablet (25 mg total) by mouth daily. 05/21/21 09/13/21  Camillia Herter, NP  tamsulosin (FLOMAX) 0.4 MG CAPS capsule Take 0.4 mg by mouth at bedtime.    [provider]      Allergies    Aspirin and Nitroglycerin    Review of Systems   Review of Systems  Cardiovascular:  Positive for chest pain.  All other systems reviewed and are negative.   Physical Exam Updated Vital Signs BP (!) 152/94 (BP Location: Right Arm)   Pulse 76   Temp 98.1 F (36.7 C) (Oral)   Resp 13   Ht 5\' 11"  (1.803 m)   Wt 117 kg   SpO2 100%   BMI 35.98 kg/m   Physical Exam Vitals and nursing note reviewed.   Constitutional:      Appearance: He is well-developed.  HENT:     Head: Normocephalic and atraumatic.  Eyes:     Conjunctiva/sclera: Conjunctivae normal.     Pupils: Pupils are equal, round, and reactive to light.  Cardiovascular:     Rate and Rhythm: Normal rate and regular rhythm.     Heart sounds: Normal heart sounds.  Pulmonary:     Effort: Pulmonary effort is normal.     Breath sounds: Normal breath sounds.  Abdominal:     General: Bowel sounds are normal.     Palpations: Abdomen is soft.  Musculoskeletal:        General: Normal range of motion.     Cervical back: Normal range of motion.  Skin:  General: Skin is warm and dry.  Neurological:     Mental Status: He is alert and oriented to person, place, and time.     ED Results / Procedures / Treatments   Labs (all labs ordered are listed, but only abnormal results are displayed) Labs Reviewed  CBC - Abnormal; Notable for the following components:      Result Value   Hemoglobin 12.5 (*)    HCT 38.1 (*)    All other components within normal limits  BASIC METABOLIC PANEL  MAGNESIUM  TROPONIN I (HIGH SENSITIVITY)    EKG None  Radiology No results found.  Procedures Procedures  {Document cardiac monitor, telemetry assessment procedure when appropriate:1}  Medications Ordered in ED Medications - No data to display  ED Course/ Medical Decision Making/ A&P                           Medical Decision Making Amount and/or Complexity of Data Reviewed Labs: ordered. Radiology: ordered.   ***  {Document critical care time when appropriate:1} {Document review of labs and clinical decision tools ie heart score, Chads2Vasc2 etc:1}  {Document your independent review of radiology images, and any outside records:1} {Document your discussion with family members, caretakers, and with consultants:1} {Document social determinants of health affecting pt's care:1} {Document your decision making why or why not  admission, treatments were needed:1} Final Clinical Impression(s) / ED Diagnoses Final diagnoses:  None    Rx / DC Orders ED Discharge Orders     None

## 2021-11-11 NOTE — ED Provider Triage Note (Signed)
Emergency Medicine Provider Triage Evaluation Note  Matthew Odom , a 51 y.o. male  was evaluated in triage.  Pt complains of rapid heart rate.  He reports that he missed dialysis on Saturday.  He states that he walked across the street and felt chest pressure with his heart racing. He tried a nitro and that didn't help.    He went home and tried to drink water.  He took another nitro and then called 9-11.   With EMS he was HR 160 narrow complex.  He failed vagal maneuvers, was given 6mg  adenosine with conversion to NSR.  He reports that then his chest pressure went away. He currently feels normal.    He denies any stimulants.   Physical Exam  BP (!) 152/94 (BP Location: Right Arm)   Pulse (!) 56   Temp 98.1 F (36.7 C) (Oral)   Resp 18   Ht 5\' 11"  (1.803 m)   Wt 117 kg   SpO2 90%   BMI 35.98 kg/m  Gen:   Awake, no distress   Resp:  Normal effort  MSK:   Moves extremities without difficulty  Other:  Normal speech.   Medical Decision Making  Medically screening exam initiated at 9:46 PM.  Appropriate orders placed.  CARLA WHILDEN was informed that the remainder of the evaluation will be completed by another provider, this initial triage assessment does not replace that evaluation, and the importance of remaining in the ED until their evaluation is complete.  His HR is 78 while I am in the room.  While performing MSE he was assigned a room in the main part of the Er.    Lorin Glass, [place] 11/11/21 2153

## 2021-11-11 NOTE — ED Triage Notes (Signed)
PER EMS: pt is from home with c/o chest pain onset tonight at 730pm. He took 2 nitro but it only just gave him a headache. HR 160, narrow complex, unchanged with vagal maneuver. 6mg  Adenosine given by EMS and HR decreased to 79 NSR. He reports he feels much better, the CP is getting better.  Pt is a Tue/Thur/Sat dialysis patient but missed it on Saturday.   BP- 170/94, HR-79, O2-98% RA. 18g RAC

## 2021-11-12 ENCOUNTER — Encounter (HOSPITAL_COMMUNITY): Payer: Self-pay | Admitting: Internal Medicine

## 2021-11-12 DIAGNOSIS — J449 Chronic obstructive pulmonary disease, unspecified: Secondary | ICD-10-CM | POA: Diagnosis present

## 2021-11-12 DIAGNOSIS — I5022 Chronic systolic (congestive) heart failure: Secondary | ICD-10-CM | POA: Diagnosis not present

## 2021-11-12 DIAGNOSIS — N250 Renal osteodystrophy: Secondary | ICD-10-CM | POA: Diagnosis not present

## 2021-11-12 DIAGNOSIS — N186 End stage renal disease: Secondary | ICD-10-CM | POA: Diagnosis not present

## 2021-11-12 DIAGNOSIS — E1129 Type 2 diabetes mellitus with other diabetic kidney complication: Secondary | ICD-10-CM | POA: Diagnosis not present

## 2021-11-12 DIAGNOSIS — IMO0001 Reserved for inherently not codable concepts without codable children: Secondary | ICD-10-CM | POA: Diagnosis present

## 2021-11-12 DIAGNOSIS — I471 Supraventricular tachycardia, unspecified: Secondary | ICD-10-CM | POA: Diagnosis present

## 2021-11-12 DIAGNOSIS — Z992 Dependence on renal dialysis: Secondary | ICD-10-CM | POA: Diagnosis not present

## 2021-11-12 DIAGNOSIS — I120 Hypertensive chronic kidney disease with stage 5 chronic kidney disease or end stage renal disease: Secondary | ICD-10-CM | POA: Diagnosis not present

## 2021-11-12 DIAGNOSIS — R079 Chest pain, unspecified: Secondary | ICD-10-CM | POA: Diagnosis not present

## 2021-11-12 LAB — HIV ANTIBODY (ROUTINE TESTING W REFLEX): HIV Screen 4th Generation wRfx: NONREACTIVE

## 2021-11-12 LAB — HEPATITIS C ANTIBODY: HCV Ab: NONREACTIVE

## 2021-11-12 LAB — BASIC METABOLIC PANEL

## 2021-11-12 LAB — TROPONIN I (HIGH SENSITIVITY)
Troponin I (High Sensitivity): 154 ng/L (ref <18)
Troponin I (High Sensitivity): 108 ng/L (ref <18)
Troponin I (High Sensitivity): 139 ng/L

## 2021-11-12 LAB — RAPID URINE DRUG SCREEN, HOSP PERFORMED
Amphetamines: NOT DETECTED
Barbiturates: NOT DETECTED
Benzodiazepines: NOT DETECTED
Cocaine: NOT DETECTED
Opiates: NOT DETECTED
Tetrahydrocannabinol: NOT DETECTED

## 2021-11-12 LAB — CBG MONITORING, ED: Glucose-Capillary: 178 mg/dL — ABNORMAL HIGH (ref 70–99)

## 2021-11-12 LAB — HEPATITIS B SURFACE ANTIBODY,QUALITATIVE: Hep B S Ab: REACTIVE — AB

## 2021-11-12 LAB — HEPATITIS B SURFACE ANTIGEN: Hepatitis B Surface Ag: NONREACTIVE

## 2021-11-12 LAB — HEPATITIS B CORE ANTIBODY, TOTAL: Hep B Core Total Ab: REACTIVE — AB

## 2021-11-12 LAB — GLUCOSE, CAPILLARY: Glucose-Capillary: 99 mg/dL (ref 70–99)

## 2021-11-12 MED ORDER — MOMETASONE FURO-FORMOTEROL FUM 200-5 MCG/ACT IN AERO
2.0000 | INHALATION_SPRAY | Freq: Two times a day (BID) | RESPIRATORY_TRACT | Status: DC
  Filled 2021-11-12 (×2): qty 8.8

## 2021-11-12 MED ORDER — DOCUSATE SODIUM 283 MG RE ENEM: 1.0000 | ENEMA | RECTAL | Status: DC | PRN

## 2021-11-12 MED ORDER — ACETAMINOPHEN 650 MG RE SUPP: 650.0000 mg | Freq: Four times a day (QID) | RECTAL | Status: DC | PRN

## 2021-11-12 MED ORDER — ALBUTEROL SULFATE (2.5 MG/3ML) 0.083% IN NEBU: 2.5000 mg | INHALATION_SOLUTION | Freq: Four times a day (QID) | RESPIRATORY_TRACT | Status: DC | PRN

## 2021-11-12 MED ORDER — HEPARIN (PORCINE) 25000 UT/250ML-% IV SOLN
1350.0000 [IU]/h | INTRAVENOUS | Status: DC
  Administered 2021-11-12: 1350 [IU]/h via INTRAVENOUS
  Filled 2021-11-12: qty 250

## 2021-11-12 MED ORDER — AMLODIPINE BESYLATE 10 MG PO TABS
10.0000 mg | ORAL_TABLET | Freq: Every day | ORAL | Status: DC
  Administered 2021-11-13: 10 mg via ORAL
  Filled 2021-11-12: qty 1

## 2021-11-12 MED ORDER — HYDROXYZINE HCL 25 MG PO TABS: 25.0000 mg | ORAL_TABLET | Freq: Three times a day (TID) | ORAL | Status: DC | PRN

## 2021-11-12 MED ORDER — INSULIN ASPART 100 UNIT/ML IJ SOLN
0.0000 [IU] | Freq: Three times a day (TID) | INTRAMUSCULAR | Status: DC
  Administered 2021-11-12: 1 [IU] via SUBCUTANEOUS
  Administered 2021-11-13: 3 [IU] via SUBCUTANEOUS

## 2021-11-12 MED ORDER — HYDRALAZINE HCL 20 MG/ML IJ SOLN: 5.0000 mg | INTRAMUSCULAR | Status: DC | PRN

## 2021-11-12 MED ORDER — INSULIN ASPART 100 UNIT/ML IJ SOLN: 0.0000 [IU] | Freq: Every day | INTRAMUSCULAR | Status: DC

## 2021-11-12 MED ORDER — HEPARIN SODIUM (PORCINE) 5000 UNIT/ML IJ SOLN
5000.0000 [IU] | Freq: Three times a day (TID) | INTRAMUSCULAR | Status: DC
  Administered 2021-11-12 – 2021-11-13 (×2): 5000 [IU] via SUBCUTANEOUS
  Filled 2021-11-12 (×2): qty 1

## 2021-11-12 MED ORDER — ONDANSETRON HCL 4 MG PO TABS: 4.0000 mg | ORAL_TABLET | Freq: Four times a day (QID) | ORAL | Status: DC | PRN

## 2021-11-12 MED ORDER — HEPARIN BOLUS VIA INFUSION
4000.0000 [IU] | Freq: Once | INTRAVENOUS | Status: AC
  Administered 2021-11-12: 4000 [IU] via INTRAVENOUS
  Filled 2021-11-12: qty 4000

## 2021-11-12 MED ORDER — HEPARIN SODIUM (PORCINE) 1000 UNIT/ML DIALYSIS
2500.0000 [IU] | INTRAMUSCULAR | Status: DC | PRN
  Filled 2021-11-12: qty 3

## 2021-11-12 MED ORDER — NICOTINE 14 MG/24HR TD PT24: 14.0000 mg | MEDICATED_PATCH | Freq: Every day | TRANSDERMAL | Status: DC | PRN

## 2021-11-12 MED ORDER — CAMPHOR-MENTHOL 0.5-0.5 % EX LOTN: 1.0000 | TOPICAL_LOTION | Freq: Three times a day (TID) | CUTANEOUS | Status: DC | PRN

## 2021-11-12 MED ORDER — CARVEDILOL 25 MG PO TABS
25.0000 mg | ORAL_TABLET | Freq: Two times a day (BID) | ORAL | Status: DC
  Administered 2021-11-13: 25 mg via ORAL
  Filled 2021-11-12: qty 1

## 2021-11-12 MED ORDER — NEPRO/CARBSTEADY PO LIQD: 237.0000 mL | Freq: Three times a day (TID) | ORAL | Status: DC | PRN

## 2021-11-12 MED ORDER — ZOLPIDEM TARTRATE 5 MG PO TABS: 5.0000 mg | ORAL_TABLET | Freq: Every evening | ORAL | Status: DC | PRN

## 2021-11-12 MED ORDER — HEPARIN SODIUM (PORCINE) 1000 UNIT/ML IJ SOLN
INTRAMUSCULAR | Status: AC
  Administered 2021-11-12: 3800 [IU] via ARTERIOVENOUS_FISTULA
  Filled 2021-11-12: qty 4

## 2021-11-12 MED ORDER — SORBITOL 70 % SOLN: 30.0000 mL | Status: DC | PRN

## 2021-11-12 MED ORDER — INSULIN GLARGINE 100 UNIT/ML ~~LOC~~ SOLN
10.0000 [IU] | Freq: Two times a day (BID) | SUBCUTANEOUS | Status: DC
  Administered 2021-11-12: 10 [IU] via SUBCUTANEOUS
  Filled 2021-11-12 (×2): qty 0.1

## 2021-11-12 MED ORDER — DOXAZOSIN MESYLATE 8 MG PO TABS
8.0000 mg | ORAL_TABLET | Freq: Every day | ORAL | Status: DC
  Administered 2021-11-12: 8 mg via ORAL
  Filled 2021-11-12: qty 1

## 2021-11-12 MED ORDER — SODIUM CHLORIDE 0.9% FLUSH
3.0000 mL | Freq: Two times a day (BID) | INTRAVENOUS | Status: DC
  Administered 2021-11-12: 3 mL via INTRAVENOUS

## 2021-11-12 MED ORDER — CHLORHEXIDINE GLUCONATE CLOTH 2 % EX PADS
6.0000 | MEDICATED_PAD | Freq: Every day | CUTANEOUS | Status: DC
  Administered 2021-11-12: 6 via TOPICAL

## 2021-11-12 MED ORDER — CALCIUM ACETATE (PHOS BINDER) 667 MG PO CAPS
1334.0000 mg | ORAL_CAPSULE | Freq: Three times a day (TID) | ORAL | Status: DC
  Administered 2021-11-13: 1334 mg via ORAL
  Filled 2021-11-12: qty 2

## 2021-11-12 MED ORDER — CALCIUM CARBONATE ANTACID 1250 MG/5ML PO SUSP: 500.0000 mg | Freq: Four times a day (QID) | ORAL | Status: DC | PRN

## 2021-11-12 MED ORDER — HYDRALAZINE HCL 100 MG PO TABS: 100.0000 mg | ORAL_TABLET | ORAL | Status: DC

## 2021-11-12 MED ORDER — HYDRALAZINE HCL 50 MG PO TABS
100.0000 mg | ORAL_TABLET | ORAL | Status: DC
  Administered 2021-11-13: 100 mg via ORAL
  Filled 2021-11-12: qty 2

## 2021-11-12 MED ORDER — ACETAMINOPHEN 325 MG PO TABS: 650.0000 mg | ORAL_TABLET | Freq: Four times a day (QID) | ORAL | Status: DC | PRN

## 2021-11-12 MED ORDER — ALBUTEROL SULFATE (2.5 MG/3ML) 0.083% IN NEBU: 2.5000 mg | INHALATION_SOLUTION | RESPIRATORY_TRACT | Status: DC | PRN

## 2021-11-12 MED ORDER — HYDRALAZINE HCL 50 MG PO TABS
100.0000 mg | ORAL_TABLET | ORAL | Status: DC
  Administered 2021-11-12: 100 mg via ORAL
  Filled 2021-11-12: qty 2

## 2021-11-12 MED ORDER — SERTRALINE HCL 25 MG PO TABS
25.0000 mg | ORAL_TABLET | Freq: Every day | ORAL | Status: DC
  Administered 2021-11-12 – 2021-11-13 (×2): 25 mg via ORAL
  Filled 2021-11-12 (×2): qty 1

## 2021-11-12 MED ORDER — ONDANSETRON HCL 4 MG/2ML IJ SOLN: 4.0000 mg | Freq: Four times a day (QID) | INTRAMUSCULAR | Status: DC | PRN

## 2021-11-12 NOTE — Consult Note (Addendum)
Cardiology Consultation:   Patient ID: Matthew Odom MRN: [id number]; DOB: 1970/09/20  Admit date: 11/11/2021 Date of Consult: 11/12/2021  PCP:  Matthew Herter, NP   [facility] Providers Cardiologist:  Matthew Field, MD      Patient Profile:   Matthew Odom is a 51 y.o. male with a hx of COPD, polysubstance abuse, DM, CAD, ESRD on HD TTS, HTN, HLD, OSA who is being seen 11/12/2021 for the evaluation of chest pain, SVT at the request of Dr. Lorin Odom.  History of Present Illness:   Matthew Odom is a 51 year old male with above medical history who has been followed by Dr. Audie Odom. Per chart review, patient was previously followed by [facility]. Patient had an echocardiogram in 02/2016 that showed EF mildly reduced 45-50%, grade II diastolic dysfunction. Due to reduced EF, underwent cardiac catheterization in 02/2016 that showed mild nonobstructive CAD, mildly decreased LV function and elevated LVEDP.   Patient was seen by [facility] in 01/2018 while he was admitted to the hospital for evaluation of SOB, HTN emergency. Underwent echocardiogram on 01/03/2018 that showed severe concentric hypertrophy, suggestive of hypertensive heart disease. Could not exclude infiltrative cardiomyopathy. Also showed EF 40-45%, grade II diastolic dysfunction.   Patient was seen in the outpatient clinic by Dr. Audie Odom for evaluation of HTN. At that time, patient had CKD but was not following with nephrology. Appears to have started HD in 06/2021 due to HTN and diabetic kidney disease  Patient presented to the ED on 7/10 complaining of chest pain. When EMS picked up the patient, it was noted that he was in SVT with HR 160s. Patient was given 6 mg adenosine with improvement in HR to 79 BPM. Labs in the DED showed Na 139, K 4.1, creatinine 4.99, SBC 7.4, hemoglobin 12.5, platelets 235. HsTn 54>>108>>154.  EKG showed sinus rhythm, HR 81 BPM, nonspecific IVCD. CXR showed stable cardiomegaly, no evidence of  acute chest disease.   On interview, patient reports that yesterday he was walking across a parking lot when he started to feel like his heart was beating uncontrollably.  Felt like his heart was racing.  Denies shortness of breath.  Did feel some chest pressure.  Patient was given adenosine by EMS and he felt his heart rate slow.  After given adenosine, patient no longer had any chest pain.  Patient denies having any chest pain on exertion recently.  Denies any recent shortness of breath, orthopnea, ankle edema, abdominal distention, weight gain.  Patient is on dialysis every Tuesday, Thursday, Saturday.  Did miss dialysis this Saturday.  Past Medical History:  Diagnosis Date   Anxiety    Cocaine use    07/15/21- "last time was more than 10 years ago."   Complication of anesthesia    wakes up during surgery   COPD (chronic obstructive pulmonary disease) ([place])    Coronary artery disease 2021   mild non obstructed   Diabetes mellitus without complication (HCC)    Enlarged heart    ESRD (end stage renal disease) ([place])    TTHSAT   History of degenerative disc disease    Hyperlipidemia    Hypertension    NSTEMI (non-ST elevated myocardial infarction) (HCC)    Pneumonia    Sciatic nerve pain    Sleep apnea    Stroke ([place])    no residual, x 3 last one was in 2020   Thoracic ascending aortic aneurysm (HCC)    4.2 cm 08/03/21 CTA  chest    Past Surgical History:  Procedure Laterality Date   AV FISTULA PLACEMENT Left 07/17/2021   Procedure: CREATION  OF LEFT ARM RADIOCEPHALIC ARTERIOVENOUS (AV) FISTULA;  Surgeon: Serafina Mitchell, MD;  Location: [facility] OR;  Service: Vascular;  Laterality: Left;   CARDIAC CATHETERIZATION     IR FLUORO GUIDE CV LINE RIGHT  05/20/2021   IR US GUIDE VASC ACCESS RIGHT  05/20/2021   LIGATION OF COMPETING BRANCHES OF ARTERIOVENOUS FISTULA Left 09/13/2021   Procedure: LIGATION AND ELEVATION OF COMPETING BRANCHES OF LEFT ARM ARTERIOVENOUS FISTULA;  Surgeon: Serafina Mitchell, MD;  Location: [facility] OR;  Service: Vascular;  Laterality: Left;     Home Medications:  Prior to Admission medications   Medication Sig Start Date End Date Taking? Authorizing Provider  acetaminophen (TYLENOL) 500 MG tablet Take 500-1,000 mg by mouth every 6 (six) hours as needed (pain.).    [provider]  albuterol (PROVENTIL) (2.5 MG/3ML) 0.083% nebulizer solution Take 3 mLs (2.5 mg total) by nebulization every 6 (six) hours as needed for wheezing or shortness of breath. 06/14/21   Spero Geralds, MD  albuterol (VENTOLIN HFA) 108 (90 Base) MCG/ACT inhaler Inhale 2 puffs into the lungs every 6 (six) hours as needed. 06/14/21   Spero Geralds, MD  amLODipine (NORVASC) 10 MG tablet Take 1 tablet (10 mg total) by mouth daily. 05/10/21 10/24/21  [place] Donnamarie Poag, DO  atorvastatin (LIPITOR) 80 MG tablet Take 1 tablet (80 mg total) by mouth daily. 05/10/21 09/13/21  [place] Donnamarie Poag, DO  budesonide-formoterol (SYMBICORT) 160-4.5 MCG/ACT inhaler Inhale 2 puffs into the lungs daily. 09/04/21   Spero Geralds, MD  Calcium 500 MG tablet Take 1 tablet by mouth 3 times daily 05/14/21   Justin Mend, MD  calcium acetate (PHOSLO) 667 MG capsule Take 1 tablet by mouth three times a day with meals 06/13/21     calcium acetate (PHOSLO) 667 MG capsule Take 2 capsules (1,334 mg total) by mouth 3 (three) times daily with meals 06/25/21     calcium carbonate (OS-CAL - DOSED IN MG OF ELEMENTAL CALCIUM) 1250 (500 Ca) MG tablet Take 1 tablet (500 mg of elemental calcium total) by mouth 2 (two) times daily with a meal. Patient not taking: Reported on 09/10/2021 05/10/21 08/08/21  [place] Donnamarie Poag, DO  calcium carbonate (TUMS EX) 750 MG chewable tablet Chew 2 tablets by mouth 3 (three) times daily with meals.    [provider]  carvedilol (COREG) 25 MG tablet Take 1 tablet (25 mg total) by mouth 2 (two) times daily with a meal. 05/10/21 09/13/21  [place] Donnamarie Poag, DO  doxazosin (CARDURA) 8 MG tablet Take 1 tablet  (8 mg total) by mouth daily. Patient taking differently: Take 8 mg by mouth at bedtime. 05/11/21 10/24/21  [place] Eric J, DO  hydrALAZINE (APRESOLINE) 100 MG tablet Take 1 tablet (100 mg total) by mouth 3 (three) times daily. Take 1 tablet by mouth three times daily Patient taking differently: Take 100 mg by mouth 3 (three) times daily. 05/10/21 09/13/21  [place] Donnamarie Poag, DO  Insulin Glargine (BASAGLAR KWIKPEN) 100 UNIT/ML Inject 20 Units into the skin daily. Patient taking differently: Inject 10 Units into the skin 2 (two) times daily. \ 06/19/21   Shamleffer, Melanie Crazier, MD  insulin lispro (HUMALOG KWIKPEN) 100 UNIT/ML KwikPen Use as directed into the skin max daily 30 units. Patient taking differently: 4-8 Units See admin instructions. Sliding scale  Take 4 units  daily then apply sliding scale 201-250=4 units 251-300=6 units 301-350=8 units 351-400=10 units  if his blood glucose is over  Use as directed into the skin max daily 30 units. 07/23/21   Shamleffer, Melanie Crazier, MD  Insulin Pen Needle 31G X 6 MM MISC Use as directed in the morning, at noon, in the evening, and at bedtime. 06/19/21   Shamleffer, Melanie Crazier, MD  liraglutide (VICTOZA) 18 MG/3ML SOPN Inject 1.8 mg into the skin daily. 06/19/21   Shamleffer, Melanie Crazier, MD  Misc. Devices MISC Please provide patient with insurance approved CPAP with the following settings:  autopap 15-20.  Large size [employer] Full Face Mask Forma mask and heated humidification. Patient taking differently: Please provide patient with insurance approved CPAP with the following settings:  autopap 15-20.  Large size [employer] Full Face Mask Forma mask and heated humidification. 12/25/19   [name], NP  nitroGLYCERIN (NITROSTAT) 0.4 MG SL tablet Place 1 tablet (0.4 mg total) under the tongue every 5 (five) minutes as needed for up to 30 doses for chest pain. 07/16/21   Wyvonnia Dusky, MD  oxyCODONE-acetaminophen  (PERCOCET/ROXICET) 5-325 MG tablet Take 1 tablet by mouth every 6 (six) hours as needed. 09/13/21   Ulyses Amor, PA-C  sertraline (ZOLOFT) 25 MG tablet Take 1 tablet (25 mg total) by mouth daily. 05/21/21 09/13/21  Matthew Herter, NP  tamsulosin (FLOMAX) 0.4 MG CAPS capsule Take 0.4 mg by mouth at bedtime.    [provider]    Inpatient Medications: Scheduled Meds:  insulin aspart  0-5 Units Subcutaneous QHS   insulin aspart  0-6 Units Subcutaneous TID WC   sodium chloride flush  3 mL Intravenous Q12H   Continuous Infusions:  heparin 1,350 Units/hr (11/12/21 0910)   PRN Meds: acetaminophen **OR** acetaminophen, albuterol, calcium carbonate (dosed in mg elemental calcium), camphor-menthol **AND** hydrOXYzine, docusate sodium, feeding supplement (NEPRO CARB STEADY), hydrALAZINE, nicotine, ondansetron **OR** ondansetron (ZOFRAN) IV, sorbitol, zolpidem  Allergies:    Allergies  Allergen Reactions   Aspirin Hives   Nitroglycerin Other (See Comments)    Nitroglycerin paste or IV will make blood pressure go up and cause headaches. The nitroglycerin tablets do fine.    Social History:   Social History   Socioeconomic History   Marital status: Married    Spouse name: Not on file   Number of children: Not on file   Years of education: Not on file   Highest education level: Not on file  Occupational History   Occupation: disabled  Tobacco Use   Smoking status: Every Day    Packs/day: 0.50    Years: 40.00    Total pack years: 20.00    Types: Cigarettes   Smokeless tobacco: Never   Tobacco comments:    Started smoking again in April.  1/2 ppd.    Vaping Use   Vaping Use: Never used  Substance and Sexual Activity   Alcohol use: Yes    Comment: very little   Drug use: Not Currently    Types: Cocaine    Comment: "years ago" per patient   Sexual activity: Yes  Other Topics Concern   Not on file  Social History Narrative   Not on file   Social Determinants of  Health   Financial Resource Strain: Not on file  Food Insecurity: Not on file  Transportation Needs: Not on file  Physical Activity: Not on file  Stress: Not on file  Social Connections: Not on file  Intimate  Partner Violence: Not on file    Family History:    Family History  Problem Relation Age of Onset   Heart attack Mother    Hypertension Mother    Diabetes Mother    Heart disease Sister    Hypertension Sister    Hypertension Father    Emphysema Father      ROS:  Please see the history of present illness.   All other ROS reviewed and negative.     Physical Exam/Data:   Vitals:   11/12/21 0915 11/12/21 0930 11/12/21 1000 11/12/21 1030  BP: (!) 163/78 (!) 142/87 (!) 174/90 127/77  Pulse: 64 65 78 71  Resp: 11 16 15 12   Temp:      TempSrc:      SpO2: 100% 100% 98% 98%  Weight:      Height:       No intake or output data in the 24 hours ending 11/12/21 1041    11/11/2021    9:10 PM 10/23/2021    9:01 AM 10/11/2021    8:03 PM  Last 3 Weights  Weight (lbs) 258 lb 258 lb 255 lb  Weight (kg) 117.028 kg 117.028 kg 115.667 kg     Body mass index is 35.98 kg/m.  General:  Pleasant middle aged gentleman in no acute distress. Sitting upright in the bed wearing Matthew Odom  HEENT: normal Neck: no JVD Vascular: Radial pulses 2+ bilaterally, ULE fistula with thrill  Cardiac:  normal S1, S2; RRR; no murmur  Lungs:  Crackles in bilateral lung bases  Abd: soft, nontender, no hepatomegaly  Ext: no edema Musculoskeletal:  No deformities, BUE and BLE strength normal and equal Skin: warm and dry  Neuro:  CNs 2-12 intact, no focal abnormalities noted Psych:  Normal affect   EKG:  The EKG was personally reviewed and demonstrates: Sinus rhythm, HR 77 BPM, PVC present, nonspecific IVCD  Telemetry:  Telemetry was personally reviewed and demonstrates:  Sinus rhythm   Relevant CV Studies:   Laboratory Data:  High Sensitivity Troponin:   Recent Labs  Lab 11/11/21 2129  11/11/21 2345 11/12/21 0108  TROPONINIHS ORDER CREDITED, SEE [id number]  34* 108* 154*     Chemistry Recent Labs  Lab 11/11/21 2129  NA ORDER CREDITED, SEE [id number]  139  K ORDER CREDITED, SEE [id number]  4.1  CL ORDER CREDITED, SEE [id number]  106  CO2 ORDER CREDITED, SEE [id number]  21*  GLUCOSE ORDER CREDITED, SEE [id number]  143*  BUN ORDER CREDITED, SEE [id number]  55*  CREATININE ORDER CREDITED, SEE [id number]  4.99*  CALCIUM ORDER CREDITED, SEE [id number]  7.5*  MG 1.8  GFRNONAA ORDER CREDITED, SEE [id number]  13*  ANIONGAP ORDER CREDITED, SEE [id number]  12    No results for input(s): "PROT", "ALBUMIN", "AST", "ALT", "ALKPHOS", "BILITOT" in the last 168 hours. Lipids No results for input(s): "CHOL", "TRIG", "HDL", "LABVLDL", "LDLCALC", "CHOLHDL" in the last 168 hours.  Hematology Recent Labs  Lab 11/11/21 2129  WBC 7.4  RBC 4.40  HGB 12.5*  HCT 38.1*  MCV 86.6  MCH 28.4  MCHC 32.8  RDW 14.0  PLT 235   Thyroid No results for input(s): "TSH", "FREET4" in the last 168 hours.  BNPNo results for input(s): "BNP", "PROBNP" in the last 168 hours.  DDimer No results for input(s): "DDIMER" in the last 168 hours.   Radiology/Studies:  DG Chest 2 View  Result Date: 11/11/2021 CLINICAL DATA:  Dialysis patient with chest pain. EXAM: CHEST - 2 VIEW COMPARISON:  Portable chest 08/02/2021. FINDINGS: A right IJ double-lumen catheter is again noted with tip at the superior cavoatrial junction. Positioning is unchanged. There is mild-to-moderate cardiomegaly without evidence of CHF. The mediastinum is normally outlined. There are eventrations along both hemidiaphragms. The lungs are clear. The sulci are sharp. Thoracic cage is intact. IMPRESSION: No evidence of acute chest disease.  Stable chest with cardiomegaly. Electronically Signed   By: Telford Nab M.D.   On: 11/11/2021 22:20     Assessment and Plan:   Chest Pressure  SVT Troponin Elevation  - Patient presented complaining of an episode of chest  pressure and rapid heart rate that occurred yesterday evening. Patient noted to be in SVT by EMS. Given adenosine with slowing of hr and return to NSR. Chest pressure went away when HR slowed. Denies having any chest pain/pressure on exertion recently   - Did have a heart cath in 2017 that showed mild, nonobstructive CAD - hsTn 54>>108>>154. Mild elevation and flattening trend is less consistent with ACS - Suspect troponin elevation is secondary to episode of SVT, volume overload/missed HD, in the setting of ESRD  - Continue to monitor patient for recurrence of SVT on telemetry while admitted. 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 Suspicious for poorly controlled HTN vs amyloidosis.  - Patient does have history of poorly controlled HTN. Could consider further workup for amyloidosis in the outpatient setting  - Patient with some crackles in lungs in the setting of missed HD-- planned to have HD today, will help volume status  - Continue carvedilol, hydralazine - Further GDMT limited by renal function   OSA  - Reports compliance with CPAP   HTN  - BP is elevated on my exam, however he has not yet had his home medications  - Resume home medications and titrate as needed   ESRD on HD TTS  - Does admit to missing HD on Saturday - Nephrology on board to coordinate HD this admission   Risk Assessment/Risk Scores:     TIMI Risk Score for Unstable Angina or Non-ST Elevation MI:   The patient's TIMI risk score is 2, which indicates a 8% risk of all cause mortality, new or recurrent myocardial infarction or need for urgent revascularization in the next 14 days.  [place] Heart Association (NYHA) Functional Class NYHA Class III        For questions or updates, please contact [facility] Please consult [email or web address] for contact info under    Signed, Margie Billet, PA-C  11/12/2021 10:41 AM  Patient examined chart reviewed Discussed care with patient, wife and PA. Exam with obese bronchitic male. Dialysis catheter right subclavian Fistula LUE with thrill Distant heart sounds. Rhonchi/exp wheezing benign abdomen trace edema. SVT Rx with adenosine NSR now. Drug screen pending but patient denies recurrent cocaine use still smoking Mild elevation in troponin undoubtedly demand ischemia from SVT Update TTE Non non ischemic DCM with cath at [facility] today If durg screen negative can consider adding alpha blocking beta blocker for his DCM and SVT Likely d/c in am with 30 day monitor and f/u with Dr Florina Ou  Jenkins Rouge MD [facility]

## 2021-11-12 NOTE — Consult Note (Signed)
Renal Service Consult Note [facility]  Matthew Odom 11/12/2021 Matthew Blazing, MD Requesting Physician: Dr. Lorin Odom  Reason for Consult: ESRD pt w/ chest pain HPI: The patient is a 51 y.o. year-old w/ hx of COPD, mild CAD, DM2, ESRD on HD, HL, HTN, OSA, sp CVA who presented to ED w/ chest pain. W/u showed SVT from rhythm strips. Missed HD Sat due to car trouble. Pt rx'd w/ adenosine for SVT w/ good result. Trops rising in ED so pt admitted. We are asked to see for ESRD.   Pt seen on HD. States he has a maturing L AVF and that they were supposed to start using it this week.  States his [facility] has been problematic at the OP HD unit since January. They even changed it out for a new one and they have the same problems they did with the prior [facility] (alarming all the time, not tolerating high UF rates).   Was able to weight standing pre HD today , is up 3 kg. No SOB or cough or leg edema.   ROS - denies CP, no joint pain, no HA, no blurry vision, no rash, no diarrhea, no nausea/ vomiting, no dysuria, no difficulty voiding   Past Medical History  Past Medical History:  Diagnosis Date   Anxiety    Cocaine use    07/15/21- "last time was more than 10 years ago."   Complication of anesthesia    wakes up during surgery   COPD (chronic obstructive pulmonary disease) ([place])    Coronary artery disease 2021   mild non obstructed   Diabetes mellitus without complication (HCC)    Enlarged heart    ESRD (end stage renal disease) ([place])    TTHSAT   History of degenerative disc disease    Hyperlipidemia    Hypertension    NSTEMI (non-ST elevated myocardial infarction) ([place])    Pneumonia    Sciatic nerve pain    Sleep apnea    Stroke ([place])    no residual, x 3 last one was in 2020   Thoracic ascending aortic aneurysm (HCC)    4.2 cm 08/03/21 CTA chest   Past Surgical History  Past Surgical History:  Procedure Laterality Date   AV FISTULA PLACEMENT Left 07/17/2021   Procedure:  CREATION  OF LEFT ARM RADIOCEPHALIC ARTERIOVENOUS (AV) FISTULA;  Surgeon: Matthew Mitchell, MD;  Location: [facility] OR;  Service: Vascular;  Laterality: Left;   CARDIAC CATHETERIZATION     IR FLUORO GUIDE CV LINE RIGHT  05/20/2021   IR US GUIDE VASC ACCESS RIGHT  05/20/2021   LIGATION OF COMPETING BRANCHES OF ARTERIOVENOUS FISTULA Left 09/13/2021   Procedure: LIGATION AND ELEVATION OF COMPETING BRANCHES OF LEFT ARM ARTERIOVENOUS FISTULA;  Surgeon: Matthew Mitchell, MD;  Location: [facility] OR;  Service: Vascular;  Laterality: Left;   Family History  Family History  Problem Relation Age of Onset   Heart attack Mother    Hypertension Mother    Diabetes Mother    Heart disease Sister    Hypertension Sister    Hypertension Father    Emphysema Father    Social History  reports that he has been smoking cigarettes. He has a 20.00 pack-year smoking history. He has never used smokeless tobacco. He reports current alcohol use. He reports that he does not currently use drugs after having used the following drugs: Cocaine. Allergies  Allergies  Allergen Reactions   Aspirin Hives   Nitroglycerin Other (See Comments)  Nitroglycerin paste or IV will make blood pressure go up and cause headaches. The nitroglycerin tablets do fine.   Home medications Prior to Admission medications   Medication Sig Start Date End Date Taking? Authorizing Provider  acetaminophen (TYLENOL) 500 MG tablet Take 500-1,000 mg by mouth every 6 (six) hours as needed (pain.).   Yes [provider]  albuterol (PROVENTIL) (2.5 MG/3ML) 0.083% nebulizer solution Take 3 mLs (2.5 mg total) by nebulization every 6 (six) hours as needed for wheezing or shortness of breath. 06/14/21  Yes Matthew Geralds, MD  albuterol (VENTOLIN HFA) 108 (90 Base) MCG/ACT inhaler Inhale 2 puffs into the lungs every 6 (six) hours as needed. 06/14/21  Yes Matthew Geralds, MD  amLODipine (NORVASC) 10 MG tablet Take 1 tablet (10 mg total) by mouth daily. 05/10/21  11/12/21 Yes Matthew [place] (Matthew Odom), Matthew J, DO  budesonide-formoterol (SYMBICORT) 160-4.5 MCG/ACT inhaler Inhale 2 puffs into the lungs daily. 09/04/21  Yes Matthew Geralds, MD  calcium acetate (PHOSLO) 667 MG capsule Take 2 capsules (1,334 mg total) by mouth 3 (three) times daily with meals 06/25/21  Yes   Calcium Carbonate Antacid (TUMS PO) Take 2 tablets by mouth 3 (three) times daily with meals.   Yes [provider]  carvedilol (COREG) 25 MG tablet Take 1 tablet (25 mg total) by mouth 2 (two) times daily with a meal. Patient taking differently: Take 25 mg by mouth See admin instructions. Take one tablet (25 mg) by mouth twice daily on Sunday, Monday, Wednesday, Friday (non-dialysis days), take one tablet (25 mg) every evening on Tuesday, Thursday, Saturday (dialysis days) 05/10/21 11/12/21 Yes Matthew [place] (Matthew Odom), Matthew Poag, DO  doxazosin (CARDURA) 8 MG tablet Take 1 tablet (8 mg total) by mouth daily. Patient taking differently: Take 8 mg by mouth at bedtime. 05/11/21 11/12/21 Yes Matthew [place] (Matthew Odom), Matthew J, DO  hydrALAZINE (APRESOLINE) 100 MG tablet Take 1 tablet (100 mg total) by mouth 3 (three) times daily. Take 1 tablet by mouth three times daily Patient taking differently: Take 100 mg by mouth See admin instructions. Take one tablet (100 mg) by mouth three times daily on Sunday, Monday, Wednesday, Friday (non-dialysis days); take one tablet (100 mg) every evening on Tuesday, Thursday, Saturday (dialysis days) 05/10/21 11/12/21 Yes Matthew [place] (Matthew Odom), Matthew Poag, DO  insulin aspart (NOVOLOG FLEXPEN) 100 UNIT/ML FlexPen Inject 4 Units into the skin 3 (three) times daily with meals.   Yes [provider]  Insulin Glargine (BASAGLAR KWIKPEN) 100 UNIT/ML Inject 20 Units into the skin daily. Patient taking differently: Inject 10 Units into the skin 2 (two) times daily. 06/19/21  Yes Matthew Odom, Matthew Crazier, MD  liraglutide (VICTOZA) 18 MG/3ML SOPN Inject 1.8 mg into the skin daily. 06/19/21  Yes Matthew Odom, Matthew Crazier, MD  nitroGLYCERIN  (NITROSTAT) 0.4 MG SL tablet Place 1 tablet (0.4 mg total) under the tongue every 5 (five) minutes as needed for up to 30 doses for chest pain. 07/16/21  Yes Wyvonnia Dusky, MD  sertraline (ZOLOFT) 25 MG tablet Take 1 tablet (25 mg total) by mouth daily. 05/21/21 11/12/21 Yes Minette Brine, Amy J, NP  tamsulosin (FLOMAX) 0.4 MG CAPS capsule Take 0.4 mg by mouth at bedtime.   Yes [provider]  atorvastatin (LIPITOR) 80 MG tablet Take 1 tablet (80 mg total) by mouth daily. Patient not taking: Reported on 11/12/2021 05/10/21 09/13/21  Matthew [place] (Matthew Odom), Matthew Poag, DO  insulin lispro (HUMALOG KWIKPEN) 100 UNIT/ML KwikPen Use as directed into the skin max daily 30 units. Patient not taking: Reported on  11/12/2021 07/23/21   Matthew Odom, Matthew Crazier, MD  Insulin Pen Needle 31G X 6 MM MISC Use as directed in the morning, at noon, in the evening, and at bedtime. 06/19/21   Matthew Odom, Matthew Crazier, MD  Misc. Devices MISC Please provide patient with insurance approved CPAP with the following settings:  autopap 15-20.  Large size [employer] Full Face Mask Forma mask and heated humidification. Patient taking differently: Please provide patient with insurance approved CPAP with the following settings:  autopap 15-20.  Large size [employer] Full Face Mask Forma mask and heated humidification. 12/25/19   [name], NP  oxyCODONE-acetaminophen (PERCOCET/ROXICET) 5-325 MG tablet Take 1 tablet by mouth every 6 (six) hours as needed. Patient not taking: Reported on 11/12/2021 09/13/21   Ulyses Amor, PA-C     Vitals:   11/12/21 0930 11/12/21 1000 11/12/21 1030 11/12/21 1100  BP: (!) 142/87 (!) 174/90 127/77 (!) 180/110  Pulse: 65 78 71 69  Resp: 16 15 12 19   Temp:      TempSrc:      SpO2: 100% 98% 98% 95%  Weight:      Height:       Exam Gen alert, no distress No rash, cyanosis or gangrene Sclera anicteric, throat clear  No jvd or bruits Chest clear bilat to bases, no rales/ wheezing RRR no  MRG Abd soft ntnd no mass or ascites +bs GU normal MS no joint effusions or deformity Ext no LE or UE edema, no wounds or ulcers Neuro is alert, Ox 3 , nf    LFA AVF+bruit/ RIJ TDC      Home meds include - albuterol, amlodipine 10, budesonide-formoterol, calcium acetate 2 ac tid, carvedilol 25 bid hold am of hd, doxazosin 8 hs, hydralazine 100 tid non hd+ qhs on tts, insulin aspart/ glargine, liraglutide, sertraline, tamsulosin, atorvastatin, oxycodone- acetaminophen, prns / vits/ supps     OP HD: TTS [facility]  4.5h  400/1.5   112.5kg   3K/2.5Ca bath TDC   Hep 5000 - last HD 7/6, post wt 112.9 - iron sucrose 50 q wk - calcitriol 2.0 mcg tiw po   Assessment/ Plan: Chest pain - no signs of MI per cardiology SVT - sp adenosine, converted  Acute on chronic combined CHF - CXR okay, missed Sat HD. Suspect vol ^. Max UF w/ HD today.  ESRD - on HD TTS. Missed Sat HD, plan HD today as above.  HD access: [facility] has malfunctioned for months, they switched it out once and he still has the same problems (alarms at higher BFR's). His AVF is ready to use; pt states that "this week" they were going to start using it.   HTN - per cards/ pmd Anemia esrd - Hb 12.5, no esa at OP center, transfuse prn.  MBD ckd - Ca 7s. Cont binder and po vdra w/ hd      Kelly Splinter  MD 11/12/2021, 11:13 AM

## 2021-11-12 NOTE — Progress Notes (Signed)
Received patient in bed, alert and oriented.  On going HD  Treatment.   Time tx completed:1927  HD treatment completed. Patient fairly tolerated tx.  Catheter without signs and symptoms of complications. Heparin locked with 1.39ml each  catheter. Patient transported back to the room, alert and orient and in no acute distress. Report given to bedside RN. Post hep sample sent to lab  Total UF removed:2259ml  Medication given: None   Post HD VS: BP 166/12mmhg, HR 70bpm, RR 12brpm, sats : 92% on 1.5L O2. 0/10 pain.   Post HD weight: 113.5kg

## 2021-11-12 NOTE — Progress Notes (Addendum)
BFR titrated down d/t pt with frequent high pressure alarms, Charge nurse to bedside to assess, charge nurse notified Dr Jonnie Finner, md to bedside to speak with pt. Md informed current BFR at 300. Will proceed with UF as alarm parameters will allow per md instruction

## 2021-11-12 NOTE — ED Notes (Addendum)
This RN called the lab tech who said that their processor has been having difficulties tonight. This Rn inquires regarding the 3rd troponin result as the documentation seems to show that it has still not resulted. This RN notifies provider who reports that she is aware

## 2021-11-12 NOTE — Progress Notes (Signed)
Refused CPAP. Agreed to wear oxygen via [place] at 2 lpm at bedtime. Informed Brianna RT. Will follow up.

## 2021-11-12 NOTE — Progress Notes (Signed)
[place] for heparin Indication: chest pain/ACS  Heparin Dosing Weight: 101 kg  Labs: Recent Labs    11/11/21 2129  HGB 12.5*  HCT 38.1*  PLT 235  CREATININE ORDER CREDITED, SEE [id number]  4.99*    Assessment: 28 yom with hx ESRD on HD presenting with CP, rising troponin. Pharmacy consulted to start heparin for ACS. Patient is not on anticoagulation PTA. Hg 12.5, plt WNL on presentation. No bleed issues reported.  Goal of Therapy:  Heparin level 0.3-0.7 units/ml Monitor platelets by anticoagulation protocol: Yes   Plan:  Heparin 4000 unit bolus Start heparin at 1350 units/h 6h heparin level Monitor daily CBC, s/sx bleeding F/u Cardiology plans   Arturo Morton, PharmD, BCPS Clinical Pharmacist 11/12/2021 8:11 AM

## 2021-11-12 NOTE — ED Notes (Signed)
Refusing blood work

## 2021-11-12 NOTE — Progress Notes (Addendum)
Arrived from ER heparin drip infusing 1350 units per hour RPIV. Pt currently denies chest pain

## 2021-11-12 NOTE — ED Notes (Signed)
Report given to Allen Kell, RN of Dialysis

## 2021-11-12 NOTE — Procedures (Signed)
Frequent high pressure alarms using his TDC.  He states this has been a problem for months, and they switched out his [facility] even once and it didn't work any better. AVF is supposed to be mature as of this week.  We are using [facility] today. With next HD we should encourage AVF use.   I was present at this dialysis session, have reviewed the session itself and made  appropriate changes Kelly Splinter MD [place] pager [phone]   11/12/2021, 4:28 PM

## 2021-11-12 NOTE — Plan of Care (Signed)

## 2021-11-12 NOTE — ED Notes (Signed)
Provider Dr Roslynn Amble notified of critical troponin of 108 through secure chat at this time

## 2021-11-12 NOTE — H&P (Signed)
History and Physical    Patient: Matthew Odom PPJ:[id number] DOB: 1971-03-23 DOA: 11/11/2021 DOS: the patient was seen and examined on 11/12/2021 PCP: Camillia Herter, NP  Patient coming from: Home - lives with wife and 2 children; NOK: Wife, [phone]   Chief Complaint: chest pain  HPI: Matthew Odom is a 51 y.o. male with medical history significant of COPD; polysubstance abuse; DM; CAD; ESRD on TTS HD; HTN; HLD; CVA; and OSA presenting with chest pain. He took his daughter for a walk.  They walked across the parking lot and he didn't feel right.  His heart felt  like it was pounding out of his chest and he felt pain in his left arm.  He took NTG and it didn't help.  The pain is not still there now.  He was given some medicine in the ambulance with improvement (adenosine).  Rhythms strips present, SVT appreciated.  The pain is completely gone now.  He was having car trouble and did not go to HD Saturday; he did have a complete session on Thursday.  No current concern for volume overload.    ER Course:  Carryover, per Dr. Marcello Moores:  Patient 51 y/o with HTN, CHF, DMII,  ESRD on HD p/w  Chest pain today walking across parking lot  Ems called found to be in svt  Patient tx with adenosine with good result . In Ed patient with rising CE, no chest pain,  Admit for further cardiac evaluation  Nephrology and Cardiology consulted by ED awaiting call back     Review of Systems: As mentioned in the history of present illness. All other systems reviewed and are negative. Past Medical History:  Diagnosis Date   Anxiety    Cocaine use    07/15/21- "last time was more than 10 years ago."   Complication of anesthesia    wakes up during surgery   COPD (chronic obstructive pulmonary disease) ([place])    Coronary artery disease 2021   mild non obstructed   Diabetes mellitus without complication (HCC)    Enlarged heart    ESRD (end stage renal disease) (HCC)    TTHSAT   History of degenerative disc  disease    Hyperlipidemia    Hypertension    NSTEMI (non-ST elevated myocardial infarction) (HCC)    Pneumonia    Sciatic nerve pain    Sleep apnea    Stroke ([place])    no residual, x 3 last one was in 2020   Thoracic ascending aortic aneurysm (HCC)    4.2 cm 08/03/21 CTA chest   Past Surgical History:  Procedure Laterality Date   AV FISTULA PLACEMENT Left 07/17/2021   Procedure: CREATION  OF LEFT ARM RADIOCEPHALIC ARTERIOVENOUS (AV) FISTULA;  Surgeon: Serafina Mitchell, MD;  Location: [facility] OR;  Service: Vascular;  Laterality: Left;   CARDIAC CATHETERIZATION     IR FLUORO GUIDE CV LINE RIGHT  05/20/2021   IR US GUIDE VASC ACCESS RIGHT  05/20/2021   LIGATION OF COMPETING BRANCHES OF ARTERIOVENOUS FISTULA Left 09/13/2021   Procedure: LIGATION AND ELEVATION OF COMPETING BRANCHES OF LEFT ARM ARTERIOVENOUS FISTULA;  Surgeon: Serafina Mitchell, MD;  Location: [facility] OR;  Service: Vascular;  Laterality: Left;   Social History:  reports that he has been smoking cigarettes. He has a 20.00 pack-year smoking history. He has never used smokeless tobacco. He reports current alcohol use. He reports that he does not currently use drugs after having used the following drugs: Cocaine.  Allergies  Allergen Reactions   Aspirin Hives   Nitroglycerin Other (See Comments)    Nitroglycerin paste or IV will make blood pressure go up and cause headaches. The nitroglycerin tablets do fine.    Family History  Problem Relation Age of Onset   Heart attack Mother    Hypertension Mother    Diabetes Mother    Heart disease Sister    Hypertension Sister    Hypertension Father    Emphysema Father     Prior to Admission medications   Medication Sig Start Date End Date Taking? Authorizing Provider  acetaminophen (TYLENOL) 500 MG tablet Take 500-1,000 mg by mouth every 6 (six) hours as needed (pain.).    [provider]  albuterol (PROVENTIL) (2.5 MG/3ML) 0.083% nebulizer solution Take 3 mLs (2.5 mg total) by  nebulization every 6 (six) hours as needed for wheezing or shortness of breath. 06/14/21   Spero Geralds, MD  albuterol (VENTOLIN HFA) 108 (90 Base) MCG/ACT inhaler Inhale 2 puffs into the lungs every 6 (six) hours as needed. 06/14/21   Spero Geralds, MD  amLODipine (NORVASC) 10 MG tablet Take 1 tablet (10 mg total) by mouth daily. 05/10/21 10/24/21  [place] Donnamarie Poag, DO  atorvastatin (LIPITOR) 80 MG tablet Take 1 tablet (80 mg total) by mouth daily. 05/10/21 09/13/21  [place] Donnamarie Poag, DO  budesonide-formoterol (SYMBICORT) 160-4.5 MCG/ACT inhaler Inhale 2 puffs into the lungs daily. 09/04/21   Spero Geralds, MD  Calcium 500 MG tablet Take 1 tablet by mouth 3 times daily 05/14/21   Justin Mend, MD  calcium acetate (PHOSLO) 667 MG capsule Take 1 tablet by mouth three times a day with meals 06/13/21     calcium acetate (PHOSLO) 667 MG capsule Take 2 capsules (1,334 mg total) by mouth 3 (three) times daily with meals 06/25/21     calcium carbonate (OS-CAL - DOSED IN MG OF ELEMENTAL CALCIUM) 1250 (500 Ca) MG tablet Take 1 tablet (500 mg of elemental calcium total) by mouth 2 (two) times daily with a meal. Patient not taking: Reported on 09/10/2021 05/10/21 08/08/21  [place] Donnamarie Poag, DO  calcium carbonate (TUMS EX) 750 MG chewable tablet Chew 2 tablets by mouth 3 (three) times daily with meals.    [provider]  carvedilol (COREG) 25 MG tablet Take 1 tablet (25 mg total) by mouth 2 (two) times daily with a meal. 05/10/21 09/13/21  [place] Donnamarie Poag, DO  doxazosin (CARDURA) 8 MG tablet Take 1 tablet (8 mg total) by mouth daily. Patient taking differently: Take 8 mg by mouth at bedtime. 05/11/21 10/24/21  [place] Eric J, DO  hydrALAZINE (APRESOLINE) 100 MG tablet Take 1 tablet (100 mg total) by mouth 3 (three) times daily. Take 1 tablet by mouth three times daily Patient taking differently: Take 100 mg by mouth 3 (three) times daily. 05/10/21 09/13/21  [place] Donnamarie Poag, DO  Insulin Glargine (BASAGLAR KWIKPEN) 100  UNIT/ML Inject 20 Units into the skin daily. Patient taking differently: Inject 10 Units into the skin 2 (two) times daily. \ 06/19/21   Shamleffer, Melanie Crazier, MD  insulin lispro (HUMALOG KWIKPEN) 100 UNIT/ML KwikPen Use as directed into the skin max daily 30 units. Patient taking differently: 4-8 Units See admin instructions. Sliding scale  Take 4 units daily then apply sliding scale 201-250=4 units 251-300=6 units 301-350=8 units 351-400=10 units  if his blood glucose is over  Use as directed into the skin max daily 30 units. 07/23/21  Shamleffer, Melanie Crazier, MD  Insulin Pen Needle 31G X 6 MM MISC Use as directed in the morning, at noon, in the evening, and at bedtime. 06/19/21   Shamleffer, Melanie Crazier, MD  liraglutide (VICTOZA) 18 MG/3ML SOPN Inject 1.8 mg into the skin daily. 06/19/21   Shamleffer, Melanie Crazier, MD  Misc. Devices MISC Please provide patient with insurance approved CPAP with the following settings:  autopap 15-20.  Large size [employer] Full Face Mask Forma mask and heated humidification. Patient taking differently: Please provide patient with insurance approved CPAP with the following settings:  autopap 15-20.  Large size [employer] Full Face Mask Forma mask and heated humidification. 12/25/19   [name], NP  nitroGLYCERIN (NITROSTAT) 0.4 MG SL tablet Place 1 tablet (0.4 mg total) under the tongue every 5 (five) minutes as needed for up to 30 doses for chest pain. 07/16/21   Wyvonnia Dusky, MD  oxyCODONE-acetaminophen (PERCOCET/ROXICET) 5-325 MG tablet Take 1 tablet by mouth every 6 (six) hours as needed. 09/13/21   Ulyses Amor, PA-C  sertraline (ZOLOFT) 25 MG tablet Take 1 tablet (25 mg total) by mouth daily. 05/21/21 09/13/21  Camillia Herter, NP  tamsulosin (FLOMAX) 0.4 MG CAPS capsule Take 0.4 mg by mouth at bedtime.    [provider]    Physical Exam: Vitals:   11/12/21 1425 11/12/21 1500 11/12/21 1525 11/12/21 1600  BP:  (!) 165/91 (!) 162/115 (!) 145/111 (!) 139/92  Pulse: 68 69 67 72  Resp: 16 20 14 16   Temp: 97.6 F (36.4 C)     TempSrc:      SpO2: 100% 100% 95% 97%  Weight:      Height:       General:  Appears calm and comfortable and is in NAD Eyes:  EOMI, normal lids, iris ENT:  grossly normal hearing, lips & tongue, mmm; suboptimal dentition Neck:  no LAD, masses or thyromegaly Cardiovascular:  RRR, no m/r/g. No LE edema.  Respiratory:   CTA bilaterally with no wheezes/rales/rhonchi.  Normal respiratory effort. Abdomen:  soft, NT, ND Skin:  no rash or induration seen on limited exam Musculoskeletal:  grossly normal tone BUE/BLE, good ROM, no bony abnormality Psychiatric:  grossly normal mood and affect, speech fluent and appropriate, AOx3 Neurologic:  CN 2-12 grossly intact, moves all extremities in coordinated fashion   Radiological Exams on Admission: Independently reviewed - see discussion in A/P where applicable  DG Chest 2 View  Result Date: 11/11/2021 CLINICAL DATA:  Dialysis patient with chest pain. EXAM: CHEST - 2 VIEW COMPARISON:  Portable chest 08/02/2021. FINDINGS: A right IJ double-lumen catheter is again noted with tip at the superior cavoatrial junction. Positioning is unchanged. There is mild-to-moderate cardiomegaly without evidence of CHF. The mediastinum is normally outlined. There are eventrations along both hemidiaphragms. The lungs are clear. The sulci are sharp. Thoracic cage is intact. IMPRESSION: No evidence of acute chest disease.  Stable chest with cardiomegaly. Electronically Signed   By: Telford Nab M.D.   On: 11/11/2021 22:20    EKG: Independently reviewed.   2102 - NSR with rate 81; IVCD; nonspecific ST changes with no evidence of acute ischemia 2214 - NSR with rate 77; IVCD; nonspecific ST changes with no evidence of acute ischemia; NSCSLT   Labs on Admission: I have personally reviewed the available labs and imaging studies at the time of the  admission.  Pertinent labs:    Glucose 143 BUN 55/Creatinine 4.99/GFR 13 HS troponin 54, 108, 154  Unremarkable CBC   Assessment and Plan: Principal Problem:   SVT (supraventricular tachycardia) (HCC) Active Problems:   Essential hypertension   Nicotine abuse   ESRD on dialysis (HCC)   Elevated troponin   Type 2 diabetes mellitus with chronic kidney disease on chronic dialysis, with long-term current use of insulin (HCC)   OSA on CPAP   COPD not affecting current episode of care [facility])    SVT -Patient presented with acute onset of CP/SOB -EMS found SVT (strips reviewed) -Symptoms resolved when heart rate normalized after adenosine -He is now comfortable -Troponin was uptrending and heparin was started but has normalized, likely demand ischemia -Will admit to telemetry -Echo pending -Cardiology is consulting, recommends a 30-day monitor at dc  Elevated troponin -Now downtrending, likely associated with demand ischemia -He does have h/o mild nonobstructive CAD on cath in 2017 -Currently with low suspicion for ACS -Heparin had been started but will stop at this time and continue to follow  ESRD on HD -Patient on chronic TTS HD -Nephrology prn order set utilized -He does not appear to be volume overloaded or otherwise in need of acute HD but did miss Saturday's session -Nephrology notified that patient will need HD  -Continue Phoslo  COPD -Continue albuterol and Symbicort (Dulera per formulary)  DM -Recent A1c was 105, indicating poor control -hold liraglutide -Continue glargine -Cover with moderate-scale SSI   HTN -Continue amlodipine, carvedilol, doxazosin (also taking tamsulosin but will stop), hydralazine  HLD -He is not currently taking atorvastatin, needs to resume if willing  OSA -Continue CPAP  Tobacco dependence -Encourage cessation.   -This was discussed with the patient and should be reviewed on an ongoing basis.   -Patch ordered    Advance  Care Planning:   Code Status: Full Code   Consults: Cardiology; Nephrology  DVT Prophylaxis: Heparin (drip -> SQ)  Family Communication: Wife was present throughout evaluation (but on telephone for part of it)  Severity of Illness: The appropriate patient status for this patient is INPATIENT. Inpatient status is judged to be reasonable and necessary in order to provide the required intensity of service to ensure the patient's safety. The patient's presenting symptoms, physical exam findings, and initial radiographic and laboratory data in the context of their chronic comorbidities is felt to place them at high risk for further clinical deterioration. Furthermore, it is not anticipated that the patient will be medically stable for discharge from the hospital within 2 midnights of admission.   * I certify that at the point of admission it is my clinical judgment that the patient will require inpatient hospital care spanning beyond 2 midnights from the point of admission due to high intensity of service, high risk for further deterioration and high frequency of surveillance required.*  Author: Karmen Bongo, MD 11/12/2021 4:17 PM  For on call review [email or web address].

## 2021-11-12 NOTE — ED Notes (Signed)
Pts wife refusing blood work

## 2021-11-13 ENCOUNTER — Inpatient Hospital Stay (HOSPITAL_BASED_OUTPATIENT_CLINIC_OR_DEPARTMENT_OTHER): Payer: Medicare Other

## 2021-11-13 ENCOUNTER — Other Ambulatory Visit: Payer: Self-pay

## 2021-11-13 DIAGNOSIS — N186 End stage renal disease: Secondary | ICD-10-CM

## 2021-11-13 DIAGNOSIS — J449 Chronic obstructive pulmonary disease, unspecified: Secondary | ICD-10-CM

## 2021-11-13 DIAGNOSIS — R079 Chest pain, unspecified: Secondary | ICD-10-CM

## 2021-11-13 DIAGNOSIS — Z992 Dependence on renal dialysis: Secondary | ICD-10-CM | POA: Diagnosis not present

## 2021-11-13 DIAGNOSIS — I471 Supraventricular tachycardia: Secondary | ICD-10-CM | POA: Diagnosis not present

## 2021-11-13 DIAGNOSIS — N250 Renal osteodystrophy: Secondary | ICD-10-CM | POA: Diagnosis not present

## 2021-11-13 DIAGNOSIS — I10 Essential (primary) hypertension: Secondary | ICD-10-CM

## 2021-11-13 DIAGNOSIS — R778 Other specified abnormalities of plasma proteins: Secondary | ICD-10-CM | POA: Diagnosis not present

## 2021-11-13 DIAGNOSIS — E1129 Type 2 diabetes mellitus with other diabetic kidney complication: Secondary | ICD-10-CM | POA: Diagnosis not present

## 2021-11-13 DIAGNOSIS — I120 Hypertensive chronic kidney disease with stage 5 chronic kidney disease or end stage renal disease: Secondary | ICD-10-CM | POA: Diagnosis not present

## 2021-11-13 LAB — CBC
HCT: 34.6 % — ABNORMAL LOW (ref 39.0–52.0)
Hemoglobin: 11.8 g/dL — ABNORMAL LOW (ref 13.0–17.0)
MCH: 29.7 pg (ref 26.0–34.0)
MCHC: 34.1 g/dL (ref 30.0–36.0)
MCV: 87.2 fL (ref 80.0–100.0)
Platelets: 214 10*3/uL (ref 150–400)
RBC: 3.97 MIL/uL — ABNORMAL LOW (ref 4.22–5.81)
RDW: 14 % (ref 11.5–15.5)
WBC: 8.4 10*3/uL (ref 4.0–10.5)
nRBC: 0 % (ref 0.0–0.2)

## 2021-11-13 LAB — BASIC METABOLIC PANEL
Anion gap: 16 — ABNORMAL HIGH (ref 5–15)
BUN: 46 mg/dL — ABNORMAL HIGH (ref 6–20)
CO2: 21 mmol/L — ABNORMAL LOW (ref 22–32)
Calcium: 7.7 mg/dL — ABNORMAL LOW (ref 8.9–10.3)
Chloride: 103 mmol/L (ref 98–111)
Creatinine, Ser: 4.89 mg/dL — ABNORMAL HIGH (ref 0.61–1.24)
GFR, Estimated: 14 mL/min — ABNORMAL LOW (ref >60)
Glucose, Bld: 163 mg/dL — ABNORMAL HIGH (ref 70–99)
Potassium: 3.9 mmol/L (ref 3.5–5.1)
Sodium: 140 mmol/L (ref 135–145)

## 2021-11-13 LAB — HEPATITIS B SURFACE ANTIBODY, QUANTITATIVE: Hep B S AB Quant (Post): 45.1 m[IU]/mL (ref >9.9)

## 2021-11-13 LAB — ECHOCARDIOGRAM COMPLETE
Area-P 1/2: 4.49 cm2
Calc EF: 41.2 %
Height: 71 in
Single Plane A2C EF: 29.9 %
Single Plane A4C EF: 52.9 %
Weight: 3887.15 oz

## 2021-11-13 LAB — GLUCOSE, CAPILLARY: Glucose-Capillary: 278 mg/dL — ABNORMAL HIGH (ref 70–99)

## 2021-11-13 LAB — MAGNESIUM: Magnesium: 1.5 mg/dL — ABNORMAL LOW (ref 1.7–2.4)

## 2021-11-13 MED ORDER — ATORVASTATIN CALCIUM 80 MG PO TABS
80.0000 mg | ORAL_TABLET | Freq: Every day | ORAL | 2 refills | Status: DC
  Filled 2021-11-13: qty 30, 30d supply, fill #0
  Filled 2021-12-03: qty 90, 90d supply, fill #0

## 2021-11-13 MED ORDER — NICOTINE 14 MG/24HR TD PT24
14.0000 mg | MEDICATED_PATCH | Freq: Every day | TRANSDERMAL | 0 refills | Status: DC | PRN
  Filled 2021-11-13: qty 28, 28d supply, fill #0

## 2021-11-13 MED ORDER — INSULIN GLARGINE-YFGN 100 UNIT/ML ~~LOC~~ SOLN
10.0000 [IU] | Freq: Two times a day (BID) | SUBCUTANEOUS | Status: DC
  Administered 2021-11-13: 10 [IU] via SUBCUTANEOUS
  Filled 2021-11-13 (×2): qty 0.1

## 2021-11-13 MED ORDER — MAGNESIUM SULFATE 2 GM/50ML IV SOLN
2.0000 g | Freq: Once | INTRAVENOUS | Status: AC
  Administered 2021-11-13: 2 g via INTRAVENOUS
  Filled 2021-11-13: qty 50

## 2021-11-13 MED ORDER — NEPRO/CARBSTEADY PO LIQD
237.0000 mL | Freq: Three times a day (TID) | ORAL | 0 refills | Status: AC | PRN
Start: 2021-11-13 — End: 2021-12-13
  Filled 2021-11-13: qty 1200, 2d supply, fill #0

## 2021-11-13 NOTE — Care Management CC44 (Signed)
Condition Code 44 Documentation Completed  Patient Details  Name: Matthew Odom MRN: [id number] Date of Birth: 03-Apr-1971   Condition Code 44 given:  Yes Patient signature on Condition Code 44 notice:  Yes Documentation of 2 MD's agreement:  Yes Code 44 added to claim:  Yes    Bethena Roys, RN 11/13/2021, 11:16 AM

## 2021-11-13 NOTE — Plan of Care (Signed)

## 2021-11-13 NOTE — Progress Notes (Signed)
D/C order noted. Contacted [facility] to advise clinic of pt's d/c today and that pt will resume care tomorrow.   Haivyn Oravec Renal Navigator [phone] 

## 2021-11-13 NOTE — Care Management Obs Status (Signed)
[place] NOTIFICATION   Patient Details  Name: RAYQUAN AMRHEIN MRN: [id number] Date of Birth: [birth year]   Medicare Observation Status Notification Given:  Yes    Bethena Roys, RN 11/13/2021, 11:15 AM

## 2021-11-13 NOTE — Progress Notes (Signed)
  Echocardiogram 2D Echocardiogram has been performed.  Matthew Odom M 11/13/2021, 9:49 AM

## 2021-11-13 NOTE — Progress Notes (Signed)
[facility] Progress Note  Subjective: seen in room, no new c/o's.   Vitals:   11/12/21 2135 11/12/21 2200 11/13/21 0335 11/13/21 0550  BP: 121/67   134/76  Pulse: 83   80  Resp: 19   19  Temp: 98.3 F (36.8 C)   98.1 F (36.7 C)  TempSrc: Oral   Oral  SpO2: 98%   97%  Weight:  110.2 kg 110.2 kg   Height:  5\' 11"  (1.803 m)      Exam: Gen alert, no distress No jvd or bruits Chest clear bilat to bases RRR no MRG Abd soft ntnd no mass or ascites +bs Ext no LE edema Neuro is alert, Ox 3 , nf    LFA AVF+bruit/ RIJ TDC    Home meds include - albuterol, amlodipine 10, budesonide-formoterol, calcium acetate 2 ac tid, carvedilol 25 bid hold am of hd, doxazosin 8 hs, hydralazine 100 tid non hd+ qhs on tts, insulin aspart/ glargine, liraglutide, sertraline, tamsulosin, atorvastatin, oxycodone- acetaminophen, prns / vits/ supps    OP HD: TTS [facility]  4.5h  400/1.5   112.5kg   3K/2.5Ca bath TDC   Hep 5000 - last HD 7/6, post wt 112.9 - iron sucrose 50 q wk - calcitriol 2.0 mcg tiw po   Assessment/ Plan: Chest pain - no signs of MI per cardiology SVT - sp adenosine, converted  Acute on chronic combined CHF - CXR okay in ED. Had 2 L UF last night w/ HD and is 2 kg under today.  ESRD - on HD TTS. Missed last OP HD on Sat. Had HD here last night overnight HD access: [facility] has malfunctioned for months, they switched it out once and he still has the same problems (alarms at higher BFR's). His AVF is ready to use; pt states that they were going to start using it this week. Used TDC w/ HD yesterday HTN - bp's wnl today, cont home meds Anemia esrd - Hb 12.5, no esa at OP center, transfuse prn.  MBD ckd - Ca 7s. Cont binder and po vdra w/ hd' Dispo - okay for d/c from renal standpoint    Matthew Odom 11/13/2021, 10:19 AM   Recent Labs  Lab 11/11/21 2129 11/13/21 0400  HGB 12.5* 11.8*  CALCIUM ORDER CREDITED, SEE [id number]  7.5* 7.7*  CREATININE ORDER CREDITED, SEE [id number]   4.99* 4.89*  K ORDER CREDITED, SEE [id number]  4.1 3.9   No results for input(s): "IRON", "TIBC", "FERRITIN" in the last 168 hours. Inpatient medications:  amLODipine  10 mg Oral Daily   calcium acetate  1,334 mg Oral TID WC   carvedilol  25 mg Oral BID WC   Chlorhexidine Gluconate Cloth  6 each Topical Q0600   doxazosin  8 mg Oral QHS   heparin injection (subcutaneous)  5,000 Units Subcutaneous Q8H   hydrALAZINE  100 mg Oral Once per day on Tue Thu Sat   And   hydrALAZINE  100 mg Oral 3 times per day on Sun Mon Wed Fri   insulin aspart  0-5 Units Subcutaneous QHS   insulin aspart  0-6 Units Subcutaneous TID WC   insulin glargine-yfgn  10 Units Subcutaneous BID   mometasone-formoterol  2 puff Inhalation BID   sertraline  25 mg Oral Daily   sodium chloride flush  3 mL Intravenous Q12H    acetaminophen **OR** acetaminophen, albuterol, calcium carbonate (dosed in mg elemental calcium), camphor-menthol **AND** hydrOXYzine, docusate sodium, feeding supplement (NEPRO CARB STEADY), hydrALAZINE,  nicotine, ondansetron **OR** ondansetron (ZOFRAN) IV, sorbitol, zolpidem

## 2021-11-13 NOTE — Progress Notes (Signed)
Subjective:  Denies SSCP, palpitations or Dyspnea   Objective:  Vitals:   11/12/21 2135 11/12/21 2200 11/13/21 0335 11/13/21 0550  BP: 121/67   134/76  Pulse: 83   80  Resp: 19   19  Temp: 98.3 F (36.8 C)   98.1 F (36.7 C)  TempSrc: Oral   Oral  SpO2: 98%   97%  Weight:  110.2 kg 110.2 kg   Height:  5\' 11"  (1.803 m)      Intake/Output from previous day:  Intake/Output Summary (Last 24 hours) at 11/13/2021 0819 Last data filed at 11/13/2021 0600 Gross per 24 hour  Intake 240 ml  Output 2200 ml  Net -1960 ml    Physical Exam: Obese black male  Right subclavian tunneled catheter Mild end exp wheezing LUE fistula with thrill  Distant heart sounds Trace edema  Lab Results: Basic Metabolic Panel: Recent Labs    11/11/21 2129 11/13/21 0400  NA ORDER CREDITED, SEE [id number]  139 140  K ORDER CREDITED, SEE [id number]  4.1 3.9  CL ORDER CREDITED, SEE [id number]  106 103  CO2 ORDER CREDITED, SEE [id number]  21* 21*  GLUCOSE ORDER CREDITED, SEE [id number]  143* 163*  BUN ORDER CREDITED, SEE [id number]  55* 46*  CREATININE ORDER CREDITED, SEE [id number]  4.99* 4.89*  CALCIUM ORDER CREDITED, SEE [id number]  7.5* 7.7*  MG 1.8 1.5*   Liver Function Tests: No results for input(s): "AST", "ALT", "ALKPHOS", "BILITOT", "PROT", "ALBUMIN" in the last 72 hours. No results for input(s): "LIPASE", "AMYLASE" in the last 72 hours. CBC: Recent Labs    11/11/21 2129 11/13/21 0400  WBC 7.4 8.4  HGB 12.5* 11.8*  HCT 38.1* 34.6*  MCV 86.6 87.2  PLT 235 214     Imaging: DG Chest 2 View  Result Date: 11/11/2021 CLINICAL DATA:  Dialysis patient with chest pain. EXAM: CHEST - 2 VIEW COMPARISON:  Portable chest 08/02/2021. FINDINGS: A right IJ double-lumen catheter is again noted with tip at the superior cavoatrial junction. Positioning is unchanged. There is mild-to-moderate cardiomegaly without evidence of CHF. The mediastinum is normally outlined. There are eventrations along both  hemidiaphragms. The lungs are clear. The sulci are sharp. Thoracic cage is intact. IMPRESSION: No evidence of acute chest disease.  Stable chest with cardiomegaly. Electronically Signed   By: Telford Nab M.D.   On: 11/11/2021 22:20    Cardiac Studies:  ECG: SR rate 81 RAD low voltage   Telemetry:  NSR 11/13/2021   Echo: pending   Medications:    amLODipine  10 mg Oral Daily   calcium acetate  1,334 mg Oral TID WC   carvedilol  25 mg Oral BID WC   Chlorhexidine Gluconate Cloth  6 each Topical Q0600   doxazosin  8 mg Oral QHS   heparin injection (subcutaneous)  5,000 Units Subcutaneous Q8H   hydrALAZINE  100 mg Oral Once per day on Tue Thu Sat   And   hydrALAZINE  100 mg Oral 3 times per day on Sun Mon Wed Fri   insulin aspart  0-5 Units Subcutaneous QHS   insulin aspart  0-6 Units Subcutaneous TID WC   insulin glargine-yfgn  10 Units Subcutaneous BID   mometasone-formoterol  2 puff Inhalation BID   sertraline  25 mg Oral Daily   sodium chloride flush  3 mL Intravenous Q12H      magnesium sulfate bolus IVPB 2 g (11/13/21 0805)    Assessment/Plan:   SVT:  resolved related  to obesity and incomplete CPAP use adenosine responsive continue coreg DCM:  normal cors on cath 2017 no chest pain when not in SVT troponin 154 demand ischemia due to arrhythmia Can risk stratify with myovue as outpatient no ischemic changes on ECG F/U Echo pending  ESRD:  transitioning to dialysis via fistula Had dialysis yesterday   Ok to d/c home from our perspective will arrange f/u with Dr Kennon Rounds 11/13/2021, 8:19 AM

## 2021-11-13 NOTE — Progress Notes (Signed)
Explained discharge instructions to patient. Reviewed follow up appointment times and next medication administration times. He verbalized having an understanding of the instructions. IV and telemetry were removed by his RN. All belongings are in the patient's possession.

## 2021-11-14 ENCOUNTER — Telehealth: Payer: Self-pay

## 2021-11-14 ENCOUNTER — Other Ambulatory Visit: Payer: Self-pay | Admitting: *Deleted

## 2021-11-14 DIAGNOSIS — E876 Hypokalemia: Secondary | ICD-10-CM | POA: Diagnosis not present

## 2021-11-14 DIAGNOSIS — Z992 Dependence on renal dialysis: Secondary | ICD-10-CM | POA: Diagnosis not present

## 2021-11-14 DIAGNOSIS — E1122 Type 2 diabetes mellitus with diabetic chronic kidney disease: Secondary | ICD-10-CM | POA: Diagnosis not present

## 2021-11-14 DIAGNOSIS — N186 End stage renal disease: Secondary | ICD-10-CM | POA: Diagnosis not present

## 2021-11-14 DIAGNOSIS — N2581 Secondary hyperparathyroidism of renal origin: Secondary | ICD-10-CM | POA: Diagnosis not present

## 2021-11-14 LAB — HEPATITIS B SURFACE ANTIBODY, QUANTITATIVE: Hep B S AB Quant (Post): 64.1 m[IU]/mL (ref >9.9)

## 2021-11-14 NOTE — Patient Outreach (Signed)
  Care Coordination [facility] Note Transition Care Management Follow-up Telephone Call Date of discharge and from where: 11/13/21 [facility] How have you been since you were released from the hospital? Patient states he is feeling "pretty good". He is currently receiving dialysis. Any questions or concerns? No  Items Reviewed: Did the pt receive and understand the discharge instructions provided? Yes  Medications obtained and verified? Yes  Other? No  Any new allergies since your discharge? No  Dietary orders reviewed? Yes Do you have support at home? Yes   Home Care and Equipment/Supplies: Were home health services ordered? no If so, what is the name of the agency? N/A  Has the agency set up a time to come to the patient's home? not applicable Were any new equipment or medical supplies ordered?  No What is the name of the medical supply agency? N/A Were you able to get the supplies/equipment? not applicable Do you have any questions related to the use of the equipment or supplies? No  Functional Questionnaire: (I = Independent and D = Dependent) ADLs: I  Bathing/Dressing- I  Meal Prep- I  Eating- I  Maintaining continence- I  Transferring/Ambulation- I  Managing Meds- I  Follow up appointments reviewed:  PCP Hospital f/u appt confirmed? No   Specialist Hospital f/u appt confirmed? Yes  Scheduled to see NP [facility] Cardiology on 11/22/21 @ 0950 and Dr. Shearon Stalls Pulmonology 12/05/21 @ 1445. Are transportation arrangements needed? No  If their condition worsens, is the pt aware to call PCP or go to the Emergency Dept.? Yes Was the patient provided with contact information for the PCP's office or ED? Yes Was to pt encouraged to call back with questions or concerns? Yes  SDOH assessments and interventions completed:   No  Care Coordination Interventions Activated:  No Care Coordination Interventions:   N/A  Encounter Outcome:  Pt. Visit Completed  Emelia Loron RN, BSN [place] [phone] [email or web address] [email or web address]

## 2021-11-14 NOTE — Telephone Encounter (Signed)
Transition Care Management Unsuccessful Follow-up Telephone Call  Date of discharge and from where:  11/13/2021, [facility]  Attempts:  1st Attempt  Reason for unsuccessful TCM follow-up call:  Unable to leave message # [phone], voicemail not set up.   After attempting to reach the patient, I see that he has completed a TOC call with Sharee Pimple Wine,RN/THN

## 2021-11-15 NOTE — Discharge Summary (Signed)
Physician Discharge Summary   Patient: Matthew Odom MRN: [id number] DOB: [birth year]  Admit date:     11/11/2021  Discharge date: 11/13/2021  Discharge Physician: Hosie Poisson   PCP: Camillia Herter, NP   Recommendations at discharge:  Please follow up with PCP in one week.  Please follow up with cardiology as needed.   Discharge Diagnoses: Principal Problem:   SVT (supraventricular tachycardia) (HCC) Active Problems:   Essential hypertension   Nicotine abuse   ESRD on dialysis (HCC)   Elevated troponin   Type 2 diabetes mellitus with chronic kidney disease on chronic dialysis, with long-term current use of insulin (HCC)   OSA on CPAP   COPD not affecting current episode of care [facility])    Hospital Course: Patient 51 y/o with HTN, CHF, DMII,  ESRD on HD p/w  Chest pain today walking across parking lot  Ems called found to be in svt  Patient tx with adenosine with good result . In Ed patient with rising CE, no chest pain,  Admit for further cardiac evaluation  Nephrology and Cardiology consulted   Assessment and Plan: SVT -Patient presented with acute onset of CP/SOB -EMS found SVT (strips reviewed) -Symptoms resolved when heart rate normalized after adenosine -He is now comfortable -Troponin was uptrending and heparin was started but has normalized, likely demand ischemia form SVT.  Cardiology consulted, plan for 30 day monitor.  - etiology probably from non compliance to CPAP use.  - echocardiogram reviewed and discussed with the patient.  - recommend outpatient follow up with a myoview stress test.    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 Cardiology recommend outpatient follow up with [facility] for risk stratification.  He currently denies any chest pain.     ESRD on HD -Patient on chronic TTS HD -Nephrology prn order set  utilized -He does not appear to be volume overloaded or otherwise in need of acute HD.     COPD -Continue albuterol and Symbicort (Dulera per formulary)   Insulin dependent type  2 DM poor ly controlled.  CBG (last 3)  Recent Labs    11/12/21 1159 11/12/21 2006 11/13/21 0754  GLUCAP 178* 99 278*   Resume home meds. And encouraged compliance to meds.    HTN -Continue amlodipine, carvedilol, doxazosin (also taking tamsulosin but will stop), hydralazine   HLD Resume statin on discharge.    OSA -Continue CPAP   Tobacco dependence -Encourage cessation.   -This was discussed with the patient and should be reviewed on an ongoing basis.   -Patch ordered          Consultants: cardiology.  Procedures performed: none.   Disposition: Home Diet recommendation:  Discharge Diet Orders (From admission, onward)     Start     Ordered   11/13/21 0000  Diet - low sodium heart healthy        11/13/21 1032           Cardiac diet DISCHARGE MEDICATION: Allergies as of 11/13/2021       Reactions   Aspirin Hives   Nitroglycerin Other (See Comments)   Nitroglycerin paste or IV will make blood pressure go up and cause headaches. The nitroglycerin tablets do fine.        Medication List     STOP taking these medications    HumaLOG KwikPen 100 UNIT/ML KwikPen Generic drug: insulin lispro  TAKE these medications    acetaminophen 500 MG tablet Commonly known as: TYLENOL Take 500-1,000 mg by mouth every 6 (six) hours as needed (pain.).   albuterol (2.5 MG/3ML) 0.083% nebulizer solution Commonly known as: PROVENTIL Take 3 mLs (2.5 mg total) by nebulization every 6 (six) hours as needed for wheezing or shortness of breath. What changed: Another medication with the same name was removed. Continue taking this medication, and follow the directions you see here.   amLODipine 10 MG tablet Commonly known as: NORVASC Take 1 tablet (10 mg total) by mouth daily.    atorvastatin 80 MG tablet Commonly known as: LIPITOR Take 1 tablet (80 mg total) by mouth daily.   Basaglar KwikPen 100 UNIT/ML Inject 20 Units into the skin daily. What changed:  how much to take when to take this   budesonide-formoterol 160-4.5 MCG/ACT inhaler Commonly known as: SYMBICORT Inhale 2 puffs into the lungs daily.   calcium acetate 667 MG capsule Commonly known as: PHOSLO Take 2 capsules (1,334 mg total) by mouth 3 (three) times daily with meals   carvedilol 25 MG tablet Commonly known as: COREG Take 1 tablet (25 mg total) by mouth 2 (two) times daily with a meal. What changed:  when to take this additional instructions   doxazosin 8 MG tablet Commonly known as: CARDURA Take 1 tablet (8 mg total) by mouth daily. What changed: when to take this   feeding supplement (NEPRO CARB STEADY) Liqd Take 237 mLs by mouth 3 (three) times daily as needed (Supplement).   hydrALAZINE 100 MG tablet Commonly known as: APRESOLINE Take 1 tablet (100 mg total) by mouth 3 (three) times daily. Take 1 tablet by mouth three times daily What changed:  when to take this additional instructions   Insulin Pen Needle 31G X 6 MM Misc Use as directed in the morning, at noon, in the evening, and at bedtime.   Misc. Devices Misc Please provide patient with insurance approved CPAP with the following settings:  autopap 15-20.  Large size [employer] Full Face Mask Forma mask and heated humidification.   nicotine 14 mg/24hr patch Commonly known as: NICODERM CQ - dosed in mg/24 hours Place 1 patch (14 mg total) onto the skin daily as needed (tobacco dependence).   nitroGLYCERIN 0.4 MG SL tablet Commonly known as: NITROSTAT Place 1 tablet (0.4 mg total) under the tongue every 5 (five) minutes as needed for up to 30 doses for chest pain.   NovoLOG FlexPen 100 UNIT/ML FlexPen Generic drug: insulin aspart Inject 4 Units into the skin 3 (three) times daily with meals.   sertraline  25 MG tablet Commonly known as: ZOLOFT Take 1 tablet (25 mg total) by mouth daily.   TUMS PO Take 2 tablets by mouth 3 (three) times daily with meals.   Victoza 18 MG/3ML Sopn Generic drug: liraglutide Inject 1.8 mg into the skin daily.        Discharge Exam: Filed Weights   11/12/21 1927 11/12/21 2200 11/13/21 0335  Weight: 113.5 kg 110.2 kg 110.2 kg   General exam: Appears calm and comfortable  Respiratory system: Clear to auscultation. Respiratory effort normal. Cardiovascular system: S1 & S2 heard, RRR. No JVD, murmurs, rubs, gallops or clicks. No pedal edema. Gastrointestinal system: Abdomen is nondistended, soft and nontender. No organomegaly or masses felt. Normal bowel sounds heard. Central nervous system: Alert and oriented. No focal neurological deficits. Extremities: Symmetric 5 x 5 power. Skin: No rashes, lesions or ulcers Psychiatry: Judgement and insight appear  normal. Mood & affect appropriate.    Condition at discharge: fair  The results of significant diagnostics from this hospitalization (including imaging, microbiology, ancillary and laboratory) are listed below for reference.   Imaging Studies: ECHOCARDIOGRAM COMPLETE  Result Date: 11/13/2021    ECHOCARDIOGRAM REPORT   Patient Name:   Matthew Odom Date of Exam: 11/13/2021 Medical Rec #:  [id number]     Height:       71.0 in Accession #:    [id number]    Weight:       242.9 lb Date of Birth:  13-Oct-1970      BSA:          2.290 m Patient Age:    23 years      BP:           134/76 mmHg Patient Gender: M             HR:           86 bpm. Exam Location:  Inpatient Procedure: 2D Echo, Cardiac Doppler and Color Doppler Indications:    Chest Pain R07.9  History:        Patient has prior history of Echocardiogram examinations.                 Previous Myocardial Infarction and CAD, Stroke, Arrythmias:SVT;                 Risk Factors:Hypertension, Diabetes, Dyslipidemia and Sleep                 Apnea. End stage renal  disease.  Sonographer:    Darlina Sicilian RDCS Referring Phys: [id number] [place]  1. Left ventricular ejection fraction, by estimation, is 45 to 50%. The left ventricle has mildly decreased function. The left ventricle has no regional wall motion abnormalities. The left ventricular internal cavity size was mildly dilated. There is severe concentric left ventricular hypertrophy. Left ventricular diastolic parameters are consistent with Grade I diastolic dysfunction (impaired relaxation). Elevated left ventricular end-diastolic pressure.  2. Right ventricular systolic function is normal. The right ventricular size is normal. Tricuspid regurgitation signal is inadequate for assessing PA pressure.  3. The mitral valve is normal in structure. No evidence of mitral valve regurgitation. No evidence of mitral stenosis.  4. The aortic valve is tricuspid. Aortic valve regurgitation is not visualized. Aortic valve sclerosis/calcification is present, without any evidence of aortic stenosis.  5. Aortic dilatation noted. There is mild dilatation of the aortic root, measuring 39 mm. There is mild dilatation of the ascending aorta, measuring 41 mm.  6. The inferior vena cava is normal in size with greater than 50% respiratory variability, suggesting right atrial pressure of 3 mmHg. FINDINGS  Left Ventricle: Left ventricular ejection fraction, by estimation, is 45 to 50%. The left ventricle has mildly decreased function. The left ventricle has no regional wall motion abnormalities. The left ventricular internal cavity size was mildly dilated. There is severe concentric left ventricular hypertrophy. Left ventricular diastolic parameters are consistent with Grade I diastolic dysfunction (impaired relaxation). Elevated left ventricular end-diastolic pressure. Right Ventricle: The right ventricular size is normal. No increase in right ventricular wall thickness. Right ventricular systolic function is normal.  Tricuspid regurgitation signal is inadequate for assessing PA pressure. Left Atrium: Left atrial size was normal in size. Right Atrium: Right atrial size was normal in size. Pericardium: There is no evidence of pericardial effusion. Mitral Valve: The mitral valve is normal in structure. No evidence of  mitral valve regurgitation. No evidence of mitral valve stenosis. Tricuspid Valve: The tricuspid valve is normal in structure. Tricuspid valve regurgitation is not demonstrated. No evidence of tricuspid stenosis. Aortic Valve: The aortic valve is tricuspid. Aortic valve regurgitation is not visualized. Aortic valve sclerosis/calcification is present, without any evidence of aortic stenosis. Pulmonic Valve: The pulmonic valve was normal in structure. Pulmonic valve regurgitation is mild. No evidence of pulmonic stenosis. Aorta: Aortic dilatation noted. There is mild dilatation of the aortic root, measuring 39 mm. There is mild dilatation of the ascending aorta, measuring 41 mm. Venous: The inferior vena cava is normal in size with greater than 50% respiratory variability, suggesting right atrial pressure of 3 mmHg. IAS/Shunts: No atrial level shunt detected by color flow Doppler.  LEFT VENTRICLE PLAX 2D LVOT diam:     2.20 cm      Diastology LV SV:         88           LV e' medial:    3.81 cm/s LV SV Index:   38           LV E/e' medial:  17.2 LVOT Area:     3.80 cm     LV e' lateral:   4.79 cm/s                             LV E/e' lateral: 13.7  LV Volumes (MOD) LV vol d, MOD A2C: 122.0 ml LV vol d, MOD A4C: 185.0 ml LV vol s, MOD A2C: 85.5 ml LV vol s, MOD A4C: 87.1 ml LV SV MOD A2C:     36.5 ml LV SV MOD A4C:     185.0 ml LV SV MOD BP:      61.9 ml RIGHT VENTRICLE RV S prime:     16.30 cm/s TAPSE (M-mode): 2.9 cm LEFT ATRIUM             Index        RIGHT ATRIUM           Index LA diam:        5.00 cm 2.18 cm/m   RA Area:     21.70 cm LA Vol (A2C):   68.8 ml 30.04 ml/m  RA Volume:   58.00 ml  25.32 ml/m LA Vol  (A4C):   62.7 ml 27.38 ml/m LA Biplane Vol: 70.1 ml 30.61 ml/m  AORTIC VALVE LVOT Vmax:   151.00 cm/s LVOT Vmean:  87.800 cm/s LVOT VTI:    0.231 m  AORTA Ao Root diam: 3.90 cm Ao Asc diam:  4.10 cm MITRAL VALVE MV Area (PHT): 4.49 cm    SHUNTS MV Decel Time: 169 msec    Systemic VTI:  0.23 m MV E velocity: 65.60 cm/s  Systemic Diam: 2.20 cm MV A velocity: 76.30 cm/s MV E/A ratio:  0.86 Fransico Him MD Electronically signed by Fransico Him MD Signature Date/Time: 11/13/2021/10:02:15 AM    Final    DG Chest 2 View  Result Date: 11/11/2021 CLINICAL DATA:  Dialysis patient with chest pain. EXAM: CHEST - 2 VIEW COMPARISON:  Portable chest 08/02/2021. FINDINGS: A right IJ double-lumen catheter is again noted with tip at the superior cavoatrial junction. Positioning is unchanged. There is mild-to-moderate cardiomegaly without evidence of CHF. The mediastinum is normally outlined. There are eventrations along both hemidiaphragms. The lungs are clear. The sulci are sharp. Thoracic cage is intact. IMPRESSION: No evidence of  acute chest disease.  Stable chest with cardiomegaly. Electronically Signed   By: Telford Nab M.D.   On: 11/11/2021 22:20   SLEEP STUDY DOCUMENTS  Result Date: 10/22/2021 Ordered by an unspecified provider.   Microbiology: Results for orders placed or performed during the hospital encounter of 05/07/21  Resp Panel by RT-PCR (Flu A&B, Covid) Nasopharyngeal Swab     Status: None   Collection Time: 05/07/21  4:52 PM   Specimen: Nasopharyngeal Swab; Nasopharyngeal(NP) swabs in vial transport medium  Result Value Ref Range Status   SARS Coronavirus 2 by RT PCR NEGATIVE NEGATIVE Final    Comment: (NOTE) SARS-CoV-2 target nucleic acids are NOT DETECTED.  The SARS-CoV-2 RNA is generally detectable in upper respiratory specimens during the acute phase of infection. The lowest concentration of SARS-CoV-2 viral copies this assay can detect is 138 copies/mL. A negative result does not  preclude SARS-Cov-2 infection and should not be used as the sole basis for treatment or other patient management decisions. A negative result may occur with  improper specimen collection/handling, submission of specimen other than nasopharyngeal swab, presence of viral mutation(s) within the areas targeted by this assay, and inadequate number of viral copies(<138 copies/mL). A negative result must be combined with clinical observations, patient history, and epidemiological information. The expected result is Negative.  Fact Sheet for Patients:  EntrepreneurPulse.com.au  Fact Sheet for Healthcare Providers:  IncredibleEmployment.be  This test is no t yet approved or cleared by the [place] FDA and  has been authorized for detection and/or diagnosis of SARS-CoV-2 by FDA under an Emergency Use Authorization (EUA). This EUA will remain  in effect (meaning this test can be used) for the duration of the COVID-19 declaration under Section 564(b)(1) of the Act, 21 U.S.C.section 360bbb-3(b)(1), unless the authorization is terminated  or revoked sooner.       Influenza A by PCR NEGATIVE NEGATIVE Final   Influenza B by PCR NEGATIVE NEGATIVE Final    Comment: (NOTE) The Xpert Xpress SARS-CoV-2/FLU/RSV plus assay is intended as an aid in the diagnosis of influenza from Nasopharyngeal swab specimens and should not be used as a sole basis for treatment. Nasal washings and aspirates are unacceptable for Xpert Xpress SARS-CoV-2/FLU/RSV testing.  Fact Sheet for Patients: EntrepreneurPulse.com.au  Fact Sheet for Healthcare Providers: IncredibleEmployment.be  This test is not yet approved or cleared by the [place] FDA and has been authorized for detection and/or diagnosis of SARS-CoV-2 by FDA under an Emergency Use Authorization (EUA). This EUA will remain in effect (meaning this test can be used) for the  duration of the COVID-19 declaration under Section 564(b)(1) of the Act, 21 U.S.C. section 360bbb-3(b)(1), unless the authorization is terminated or revoked.  Performed at [facility], [address] [place], [place] [address]   MRSA Next Gen by PCR, Nasal     Status: None   Collection Time: 05/07/21  7:37 PM   Specimen: Nasal Mucosa; Nasal Swab  Result Value Ref Range Status   MRSA by PCR Next Gen NOT DETECTED NOT DETECTED Final    Comment: (NOTE) The GeneXpert MRSA Assay (FDA approved for NASAL specimens only), is one component of a comprehensive MRSA colonization surveillance program. It is not intended to diagnose MRSA infection nor to guide or monitor treatment for MRSA infections. Test performance is not FDA approved in patients less than 70 years old. Performed at [facility], [place] [address], [place], [place] [address]     Labs: CBC: Recent Labs  Lab 11/11/21 2129 11/13/21 0400  WBC 7.4 8.4  HGB 12.5* 11.8*  HCT 38.1* 34.6*  MCV 86.6 87.2  PLT 235 233   Basic Metabolic Panel: Recent Labs  Lab 11/11/21 2129 11/13/21 0400  NA ORDER CREDITED, SEE [id number]  139 140  K ORDER CREDITED, SEE [id number]  4.1 3.9  CL ORDER CREDITED, SEE [id number]  106 103  CO2 ORDER CREDITED, SEE [id number]  21* 21*  GLUCOSE ORDER CREDITED, SEE [id number]  143* 163*  BUN ORDER CREDITED, SEE [id number]  55* 46*  CREATININE ORDER CREDITED, SEE [id number]  4.99* 4.89*  CALCIUM ORDER CREDITED, SEE [id number]  7.5* 7.7*  MG 1.8 1.5*   Liver Function Tests: No results for input(s): "AST", "ALT", "ALKPHOS", "BILITOT", "PROT", "ALBUMIN" in the last 168 hours. CBG: Recent Labs  Lab 11/12/21 1159 11/12/21 2006 11/13/21 0754  GLUCAP 178* 99 278*    Discharge time spent: 42 minutes.   Signed: Hosie Poisson, MD [facility] 11/15/2021

## 2021-11-16 DIAGNOSIS — N2581 Secondary hyperparathyroidism of renal origin: Secondary | ICD-10-CM | POA: Diagnosis not present

## 2021-11-16 DIAGNOSIS — N186 End stage renal disease: Secondary | ICD-10-CM | POA: Diagnosis not present

## 2021-11-16 DIAGNOSIS — E876 Hypokalemia: Secondary | ICD-10-CM | POA: Diagnosis not present

## 2021-11-16 DIAGNOSIS — Z992 Dependence on renal dialysis: Secondary | ICD-10-CM | POA: Diagnosis not present

## 2021-11-19 ENCOUNTER — Other Ambulatory Visit: Payer: Self-pay

## 2021-11-19 DIAGNOSIS — Z992 Dependence on renal dialysis: Secondary | ICD-10-CM | POA: Diagnosis not present

## 2021-11-19 DIAGNOSIS — N186 End stage renal disease: Secondary | ICD-10-CM | POA: Diagnosis not present

## 2021-11-19 DIAGNOSIS — N2581 Secondary hyperparathyroidism of renal origin: Secondary | ICD-10-CM | POA: Diagnosis not present

## 2021-11-19 DIAGNOSIS — E876 Hypokalemia: Secondary | ICD-10-CM | POA: Diagnosis not present

## 2021-11-21 DIAGNOSIS — Z992 Dependence on renal dialysis: Secondary | ICD-10-CM | POA: Diagnosis not present

## 2021-11-21 DIAGNOSIS — N2581 Secondary hyperparathyroidism of renal origin: Secondary | ICD-10-CM | POA: Diagnosis not present

## 2021-11-21 DIAGNOSIS — E876 Hypokalemia: Secondary | ICD-10-CM | POA: Diagnosis not present

## 2021-11-21 DIAGNOSIS — N186 End stage renal disease: Secondary | ICD-10-CM | POA: Diagnosis not present

## 2021-11-21 NOTE — Progress Notes (Signed)
Cardiology Clinic Note   Patient Name: Matthew Odom Date of Encounter: 11/22/2021  Primary Care Provider:  Camillia Herter, NP Primary Cardiologist:  Evalina Field, MD  Patient Profile     51 y/o with HTN, CHF, DM Type II,  ESRD on HD,OSA on CPAP, and COPD. Recent admission on 11/11/2021 for SVT and chest pain.  Was seen by Dr. Alfonse Spruce on consultation.  SVT was corrected with adenosine.  Felt as if SVT was related to obesity and noncompliance with CPAP.  Plan to have outpatient stress Myoview.   Past Medical History    Past Medical History:  Diagnosis Date   Anxiety    Cocaine use    07/15/21- "last time was more than 10 years ago."   Complication of anesthesia    wakes up during surgery   COPD (chronic obstructive pulmonary disease) ([place])    Coronary artery disease 2021   mild non obstructed   Diabetes mellitus without complication (HCC)    Enlarged heart    ESRD (end stage renal disease) (HCC)    TTHSAT   History of degenerative disc disease    Hyperlipidemia    Hypertension    NSTEMI (non-ST elevated myocardial infarction) (HCC)    Pneumonia    Sciatic nerve pain    Sleep apnea    Stroke ([place])    no residual, x 3 last one was in 2020   Thoracic ascending aortic aneurysm (HCC)    4.2 cm 08/03/21 CTA chest   Past Surgical History:  Procedure Laterality Date   AV FISTULA PLACEMENT Left 07/17/2021   Procedure: CREATION  OF LEFT ARM RADIOCEPHALIC ARTERIOVENOUS (AV) FISTULA;  Surgeon: Serafina Mitchell, MD;  Location[facility];  Service: Vascular;  Laterality: Left;   CARDIAC CATHETERIZATION     IR FLUORO GUIDE CV LINE RIGHT  05/20/2021   IR US GUIDE VASC ACCESS RIGHT  05/20/2021   LIGATION OF COMPETING BRANCHES OF ARTERIOVENOUS FISTULA Left 09/13/2021   Procedure: LIGATION AND ELEVATION OF COMPETING BRANCHES OF LEFT ARM ARTERIOVENOUS FISTULA;  Surgeon: Serafina Mitchell, MD;  Location: [facility];  Service: Vascular;  Laterality: Left;    Allergies  Allergies  Allergen  Reactions   Aspirin Hives   Nitroglycerin Other (See Comments)    Nitroglycerin paste or IV will make blood pressure go up and cause headaches. The nitroglycerin tablets do fine.    History of Present Illness    Mr. Matthew Odom comes today status post hospitalization where he was admitted with chest pain and SVT with known history of end-stage renal disease on dialysis, OSA with inconsistent use of CPAP, hypertension, ongoing tobacco abuse, and type 2 diabetes.  Echocardiogram revealed an EF of 45 to 50% with severe left ventricular hypertrophy and grade 1 diastolic dysfunction.  He was converted to normal sinus rhythm on a [place] and carvedilol.  He was discharged on 11/13/2021.  Mr. Matthew Odom comes today without any recurrent complaints of rapid heart rhythm.  He does admit to drinking caffeine but not excessively.  His wife is on the phone with him throughout this visit.  She assists in answering questions.  He continues with dialysis on Tuesdays Thursdays and Saturdays.  He is medically compliant.  He denies chest discomfort currently.  He unfortunately continues to smoke.  Home Medications    Current Outpatient Medications  Medication Sig Dispense Refill   acetaminophen (TYLENOL) 500 MG tablet Take 500-1,000 mg by mouth every 6 (six) hours as needed (pain.).  albuterol (PROVENTIL) (2.5 MG/3ML) 0.083% nebulizer solution Take 3 mLs (2.5 mg total) by nebulization every 6 (six) hours as needed for wheezing or shortness of breath. 150 mL 2   atorvastatin (LIPITOR) 80 MG tablet Take 1 tablet (80 mg total) by mouth daily. 30 tablet 2   budesonide-formoterol (SYMBICORT) 160-4.5 MCG/ACT inhaler Inhale 2 puffs into the lungs daily. 10.2 g 5   calcium acetate (PHOSLO) 667 MG capsule Take 2 capsules (1,334 mg total) by mouth 3 (three) times daily with meals 540 capsule 3   Calcium Carbonate Antacid (TUMS PO) Take 2 tablets by mouth 3 (three) times daily with meals.     Insulin Glargine (BASAGLAR KWIKPEN)  100 UNIT/ML Inject 20 Units into the skin daily. (Patient taking differently: Inject 10 Units into the skin 2 (two) times daily.) 30 mL 3   Insulin Pen Needle 31G X 6 MM MISC Use as directed in the morning, at noon, in the evening, and at bedtime. 400 each 3   liraglutide (VICTOZA) 18 MG/3ML SOPN Inject 1.8 mg into the skin daily. 27 mL 3   Misc. Devices MISC Please provide patient with insurance approved CPAP with the following settings:  autopap 15-20.  Large size [employer] Full Face Mask Forma mask and heated humidification. (Patient taking differently: Please provide patient with insurance approved CPAP with the following settings:  autopap 15-20.  Large size [employer] Full Face Mask Forma mask and heated humidification.) 1 each 0   nitroGLYCERIN (NITROSTAT) 0.4 MG SL tablet Place 1 tablet (0.4 mg total) under the tongue every 5 (five) minutes as needed for up to 30 doses for chest pain. 30 tablet 0   Nutritional Supplements (FEEDING SUPPLEMENT, NEPRO CARB STEADY,) LIQD Take 237 mLs by mouth 3 (three) times daily as needed (Supplement). 1200 mL 0   amLODipine (NORVASC) 10 MG tablet Take 1 tablet (10 mg total) by mouth daily. 30 tablet 2   carvedilol (COREG) 25 MG tablet Take 1 tablet (25 mg total) by mouth 2 (two) times daily with a meal. 60 tablet 2   doxazosin (CARDURA) 8 MG tablet Take 1 tablet (8 mg total) by mouth daily. 30 tablet 2   hydrALAZINE (APRESOLINE) 100 MG tablet Take 1 tablet (100 mg total) by mouth 3 (three) times daily. Take 1 tablet by mouth three times daily 90 tablet 2   nicotine (NICODERM CQ - DOSED IN MG/24 HOURS) 14 mg/24hr patch Place 1 patch (14 mg total) onto the skin daily as needed (tobacco dependence). 28 patch 0   sertraline (ZOLOFT) 25 MG tablet Take 1 tablet (25 mg total) by mouth daily. 30 tablet 0   No current facility-administered medications for this visit.     Family History    Family History  Problem Relation Age of Onset   Heart attack Mother     Hypertension Mother    Diabetes Mother    Heart disease Sister    Hypertension Sister    Hypertension Father    Emphysema Father    He indicated that his mother is deceased. He indicated that his father is deceased. He indicated that the status of his sister is unknown.  Social History    Social History   Socioeconomic History   Marital status: Married    Spouse name: Not on file   Number of children: Not on file   Years of education: Not on file   Highest education level: Not on file  Occupational History   Occupation: disabled  Tobacco Use  Smoking status: Every Day    Packs/day: 0.50    Years: 40.00    Total pack years: 20.00    Types: Cigarettes   Smokeless tobacco: Never   Tobacco comments:    Started smoking again in April.  1/2 ppd.    Vaping Use   Vaping Use: Never used  Substance and Sexual Activity   Alcohol use: Yes    Comment: very little   Drug use: Not Currently    Types: Cocaine    Comment: "years ago" per patient   Sexual activity: Yes  Other Topics Concern   Not on file  Social History Narrative   Not on file   Social Determinants of Health   Financial Resource Strain: Not on file  Food Insecurity: Not on file  Transportation Needs: Not on file  Physical Activity: Not on file  Stress: Not on file  Social Connections: Not on file  Intimate Partner Violence: Not on file     Review of Systems    General:  No chills, fever, night sweats or weight changes.  Cardiovascular:  No chest pain, dyspnea on exertion, edema, orthopnea, palpitations, paroxysmal nocturnal dyspnea. Dermatological: No rash, lesions/masses Respiratory: No cough, dyspnea Urologic: No hematuria, dysuria Abdominal:   No nausea, vomiting, diarrhea, bright red blood per rectum, melena, or hematemesis Neurologic:  No visual changes, wkns, changes in mental status. All other systems reviewed and are otherwise negative except as noted above.     Physical Exam    VS:  BP  (!) 160/92   Pulse 92   Ht 5\' 11"  (1.803 m)   Wt 259 lb 3.2 oz (117.6 kg)   SpO2 97%   BMI 36.15 kg/m  , BMI Body mass index is 36.15 kg/m.     GEN: Well nourished, well developed, in no acute distress. HEENT: normal. Neck: Supple, no JVD, carotid bruits, or masses. Cardiac: RRR, no murmurs, rubs, or gallops. No clubbing, cyanosis, edema.  Radials/DP/PT 2+ and equal bilaterally.  Respiratory:  Respirations regular and unlabored, clear to auscultation bilaterally. GI: Soft, nontender, nondistended, BS + x 4. MS: no deformity or atrophy. Skin: warm and dry, no rash.  Dialysis catheter right upper subclavian.  Dressing intact Neuro:  Strength and sensation are intact. Psych: Normal affect.  Accessory Clinical Findings    ECG personally reviewed by me today-sinus rhythm with PVCs, LVH is noted, T wave inversion V5 and V6 and V1.  Heart rate of 92 bpm- No acute changes  Lab Results  Component Value Date   WBC 8.4 11/13/2021   HGB 11.8 (L) 11/13/2021   HCT 34.6 (L) 11/13/2021   MCV 87.2 11/13/2021   PLT 214 11/13/2021   Lab Results  Component Value Date   CREATININE 4.89 (H) 11/13/2021   BUN 46 (H) 11/13/2021   NA 140 11/13/2021   K 3.9 11/13/2021   CL 103 11/13/2021   CO2 21 (L) 11/13/2021   Lab Results  Component Value Date   ALT 13 05/09/2021   AST 14 (L) 05/09/2021   ALKPHOS 95 05/09/2021   BILITOT 0.5 05/09/2021   Lab Results  Component Value Date   CHOL 220 (H) 01/29/2021   HDL 35 (L) 01/29/2021   LDLCALC 126 (H) 01/29/2021   TRIG 331 (H) 01/29/2021   CHOLHDL 6.3 (H) 01/29/2021    Lab Results  Component Value Date   HGBA1C 10.5 (A) 06/14/2021    Review of Prior Studies:  Echocardiogram 11/13/2021  1. Left ventricular ejection fraction,  by estimation, is 45 to 50%. The  left ventricle has mildly decreased function. The left ventricle has no  regional wall motion abnormalities. The left ventricular internal cavity  size was mildly dilated. There is   severe concentric left ventricular hypertrophy. Left ventricular diastolic  parameters are consistent with Grade I diastolic dysfunction (impaired  relaxation). Elevated left ventricular end-diastolic pressure.   2. Right ventricular systolic function is normal. The right ventricular  size is normal. Tricuspid regurgitation signal is inadequate for assessing  PA pressure.   3. The mitral valve is normal in structure. No evidence of mitral valve  regurgitation. No evidence of mitral stenosis.   4. The aortic valve is tricuspid. Aortic valve regurgitation is not  visualized. Aortic valve sclerosis/calcification is present, without any  evidence of aortic stenosis.   5. Aortic dilatation noted. There is mild dilatation of the aortic root,  measuring 39 mm. There is mild dilatation of the ascending aorta,  measuring 41 mm.   6. The inferior vena cava is normal in size with greater than 50%  respiratory variability, suggesting right atrial pressure of 3 mmHg.    Assessment & Plan   1.  PSVT: Resolved with use of adenosine during ED visit.  No recurrences.  He remains on carvedilol 25 mg twice daily.  2.  HFrEF: Most recent echocardiogram revealing an EF of 45 to 50% with severe left ventricular hypertrophy and grade 1 diastolic dysfunction.  Echocardiogram showed no evidence of amyloidosis.  Volume management per dialysis.  No evidence of volume overload today.  Plan exercise stress Myoview for diagnostic prognostic purposes, with cardiovascular risk factors to include hypertension, diabetes, hypercholesterolemia, and family history..  3.  Hypertension: Elevated here in the office.  He states that this level is very good for him as he normally runs over 833 systolic.    However he states that during dialysis his blood pressure decreases significantly to around 825-053  systolic.  When he leaves dialysis he states that his blood pressure is much lower in the 976B to 341P systolic.  We will  continue to monitor this.  He will remain on amlodipine 10 mg daily and carvedilol 25 mg twice daily, as well as 3 times daily hydralazine 100 mg.  4.  End-stage renal disease: Followed by nephrology with dialysis on Tuesdays Thursdays and Saturdays.  External dialysis port right upper chest.  Awaiting fistula placement.  5.  Ongoing tobacco abuse: Smoking cessation has been recommended.  I did go over his CT scan of his chest that did show some pulmonary nodules.  He will have follow-up CT scan of the chest in 1 year.  5.  Hypercholesterolemia: Remains on atorvastatin high-dose 80 mg daily.  Labs are followed by PCP but can be managed through cardiology if these are not completed on follow-up office visits.  6.OSA on CPAP: He reports that he is now compliant.  Current medicines are reviewed at length with the patient today.  I have spent 30 min's  dedicated to the care of this patient on the date of this encounter to include pre-visit review of records, assessment, management and diagnostic testing,with shared decision making. Signed, Phill Myron. West Pugh, ANP, AACC   11/22/2021 12:18 PM    [facility] [place] [address] Office [phone] Fax [phone]  Notice: This dictation was prepared with Dragon dictation along with smaller phrase technology. Any transcriptional errors that result from this process are unintentional and may not be corrected  upon review.

## 2021-11-22 ENCOUNTER — Encounter: Payer: Self-pay | Admitting: Adult Health

## 2021-11-22 ENCOUNTER — Ambulatory Visit (INDEPENDENT_AMBULATORY_CARE_PROVIDER_SITE_OTHER): Payer: Medicare Other | Admitting: Adult Health

## 2021-11-22 ENCOUNTER — Other Ambulatory Visit: Payer: Self-pay

## 2021-11-22 VITALS — BP 160/92 | HR 92 | Ht 71.0 in | Wt 259.2 lb

## 2021-11-22 DIAGNOSIS — Z992 Dependence on renal dialysis: Secondary | ICD-10-CM | POA: Diagnosis not present

## 2021-11-22 DIAGNOSIS — I2510 Atherosclerotic heart disease of native coronary artery without angina pectoris: Secondary | ICD-10-CM

## 2021-11-22 DIAGNOSIS — E1122 Type 2 diabetes mellitus with diabetic chronic kidney disease: Secondary | ICD-10-CM

## 2021-11-22 DIAGNOSIS — I471 Supraventricular tachycardia, unspecified: Secondary | ICD-10-CM

## 2021-11-22 DIAGNOSIS — N186 End stage renal disease: Secondary | ICD-10-CM | POA: Diagnosis not present

## 2021-11-22 DIAGNOSIS — Z794 Long term (current) use of insulin: Secondary | ICD-10-CM

## 2021-11-22 DIAGNOSIS — I5022 Chronic systolic (congestive) heart failure: Secondary | ICD-10-CM | POA: Diagnosis not present

## 2021-11-22 DIAGNOSIS — I10 Essential (primary) hypertension: Secondary | ICD-10-CM

## 2021-11-22 MED ORDER — CARVEDILOL 25 MG PO TABS
25.0000 mg | ORAL_TABLET | Freq: Two times a day (BID) | ORAL | 2 refills | Status: DC
  Filled 2021-11-22 – 2021-12-03 (×2): qty 180, 90d supply, fill #0

## 2021-11-22 MED ORDER — NICOTINE 14 MG/24HR TD PT24
14.0000 mg | MEDICATED_PATCH | Freq: Every day | TRANSDERMAL | 0 refills | Status: DC | PRN
  Filled 2021-11-22 – 2022-09-11 (×2): qty 28, 28d supply, fill #0

## 2021-11-22 MED ORDER — HYDRALAZINE HCL 100 MG PO TABS
100.0000 mg | ORAL_TABLET | Freq: Three times a day (TID) | ORAL | 2 refills | Status: DC
  Filled 2021-11-22 – 2021-12-03 (×2): qty 270, 90d supply, fill #0

## 2021-11-22 MED ORDER — DOXAZOSIN MESYLATE 8 MG PO TABS
8.0000 mg | ORAL_TABLET | Freq: Every day | ORAL | 2 refills | Status: DC
Start: 2021-11-22 — End: 2022-05-12
  Filled 2021-11-22 – 2021-12-03 (×2): qty 90, 90d supply, fill #0

## 2021-11-22 MED ORDER — AMLODIPINE BESYLATE 10 MG PO TABS
10.0000 mg | ORAL_TABLET | Freq: Every day | ORAL | 2 refills | Status: DC
  Filled 2021-11-22 – 2021-12-03 (×2): qty 90, 90d supply, fill #0

## 2021-11-22 NOTE — Patient Instructions (Addendum)
Medication Instructions:  No Changes *If you need a refill on your cardiac medications before your next appointment, please call your pharmacy*   Lab Work: No Labs If you have labs (blood work) drawn today and your tests are completely normal, you will receive your results only by: [place] (if you have MyChart) OR A paper copy in the mail If you have any lab test that is abnormal or we need to change your treatment, we will call you to review the results.   Testing/Procedures:[address], [address]. Your physician has requested that you have a lexiscan myoview. For further information please visit [email or web address]. Please follow instruction sheet, as given.    Follow-Up: At [facility], you and your health needs are our priority.  As part of our continuing mission to provide you with exceptional heart care, we have created designated Provider Care Teams.  These Care Teams include your primary Cardiologist (physician) and Advanced Practice Providers (APPs -  Physician Assistants and Nurse Practitioners) who all work together to provide you with the care you need, when you need it.  We recommend signing up for the patient portal called "MyChart".  Sign up information is provided on this After Visit Summary.  MyChart is used to connect with patients for Virtual Visits (Telemedicine).  Patients are able to view lab/test results, encounter notes, upcoming appointments, etc.  Non-urgent messages can be sent to your provider as well.   To learn more about what you can do with MyChart, go to [email or web address].    Your next appointment:   1 month(s)  The format for your next appointment:   In Person  Provider:   Jory Sims, DNP, ANP   or, Evalina Field, MD  .     Important Information About Sugar

## 2021-11-23 DIAGNOSIS — Z992 Dependence on renal dialysis: Secondary | ICD-10-CM | POA: Diagnosis not present

## 2021-11-23 DIAGNOSIS — N186 End stage renal disease: Secondary | ICD-10-CM | POA: Diagnosis not present

## 2021-11-23 DIAGNOSIS — E876 Hypokalemia: Secondary | ICD-10-CM | POA: Diagnosis not present

## 2021-11-23 DIAGNOSIS — N2581 Secondary hyperparathyroidism of renal origin: Secondary | ICD-10-CM | POA: Diagnosis not present

## 2021-11-29 ENCOUNTER — Other Ambulatory Visit: Payer: Self-pay

## 2021-11-30 DIAGNOSIS — N186 End stage renal disease: Secondary | ICD-10-CM | POA: Diagnosis not present

## 2021-11-30 DIAGNOSIS — E876 Hypokalemia: Secondary | ICD-10-CM | POA: Diagnosis not present

## 2021-11-30 DIAGNOSIS — Z992 Dependence on renal dialysis: Secondary | ICD-10-CM | POA: Diagnosis not present

## 2021-11-30 DIAGNOSIS — N2581 Secondary hyperparathyroidism of renal origin: Secondary | ICD-10-CM | POA: Diagnosis not present

## 2021-12-02 ENCOUNTER — Other Ambulatory Visit: Payer: Self-pay

## 2021-12-03 ENCOUNTER — Other Ambulatory Visit: Payer: Self-pay

## 2021-12-03 DIAGNOSIS — E1129 Type 2 diabetes mellitus with other diabetic kidney complication: Secondary | ICD-10-CM | POA: Diagnosis not present

## 2021-12-03 DIAGNOSIS — E876 Hypokalemia: Secondary | ICD-10-CM | POA: Diagnosis not present

## 2021-12-03 DIAGNOSIS — N2581 Secondary hyperparathyroidism of renal origin: Secondary | ICD-10-CM | POA: Diagnosis not present

## 2021-12-03 DIAGNOSIS — N186 End stage renal disease: Secondary | ICD-10-CM | POA: Diagnosis not present

## 2021-12-03 DIAGNOSIS — D689 Coagulation defect, unspecified: Secondary | ICD-10-CM | POA: Diagnosis not present

## 2021-12-03 DIAGNOSIS — Z992 Dependence on renal dialysis: Secondary | ICD-10-CM | POA: Diagnosis not present

## 2021-12-05 ENCOUNTER — Ambulatory Visit (HOSPITAL_COMMUNITY): Payer: Medicare Other

## 2021-12-05 ENCOUNTER — Encounter: Payer: Self-pay | Admitting: Internal Medicine

## 2021-12-05 ENCOUNTER — Other Ambulatory Visit: Payer: Self-pay

## 2021-12-05 ENCOUNTER — Ambulatory Visit (INDEPENDENT_AMBULATORY_CARE_PROVIDER_SITE_OTHER): Payer: Medicare Other | Admitting: Internal Medicine

## 2021-12-05 VITALS — BP 150/90 | HR 102 | Ht 71.0 in | Wt 255.0 lb

## 2021-12-05 DIAGNOSIS — E876 Hypokalemia: Secondary | ICD-10-CM | POA: Diagnosis not present

## 2021-12-05 DIAGNOSIS — G4733 Obstructive sleep apnea (adult) (pediatric): Secondary | ICD-10-CM | POA: Diagnosis not present

## 2021-12-05 DIAGNOSIS — N186 End stage renal disease: Secondary | ICD-10-CM | POA: Diagnosis not present

## 2021-12-05 DIAGNOSIS — D689 Coagulation defect, unspecified: Secondary | ICD-10-CM | POA: Diagnosis not present

## 2021-12-05 DIAGNOSIS — Z992 Dependence on renal dialysis: Secondary | ICD-10-CM | POA: Diagnosis not present

## 2021-12-05 DIAGNOSIS — N2581 Secondary hyperparathyroidism of renal origin: Secondary | ICD-10-CM | POA: Diagnosis not present

## 2021-12-05 DIAGNOSIS — R0602 Shortness of breath: Secondary | ICD-10-CM | POA: Diagnosis not present

## 2021-12-05 NOTE — Progress Notes (Signed)
Matthew Odom    [id number]    [birth year]  Primary Care Physician:Stephens, Flonnie Hailstone, NP Date of Appointment: 12/05/2021 Established Patient Visit  Chief complaint:   Chief Complaint  Patient presents with   Follow-up    Follow-up:Does not have CPAP machine or Pulmonary medicines.     HPI: Matthew Odom is a 51 y.o. man with OSA on CPAP, ESRD, Heart Failure and COPD.   Interval Updates: Here for follow up after resuming BIPAP for OSA and getting back on symbicort for COPD.   Reviewed sleep study results - Bipap ordered. He should receive it next week.  He hasn't picked up his symbicort and albuterol from the pharmacy - thought it would get mailed to him.  Having worsening dyspnea. And daytime sleepiness - was falling asleep in the waiting room.     I have reviewed the patient's family social and past medical history and updated as appropriate.   Past Medical History:  Diagnosis Date   Anxiety    Cocaine use    07/15/21- "last time was more than 10 years ago."   Complication of anesthesia    wakes up during surgery   COPD (chronic obstructive pulmonary disease) ([place])    Coronary artery disease 2021   mild non obstructed   Diabetes mellitus without complication (HCC)    Enlarged heart    ESRD (end stage renal disease) (HCC)    TTHSAT   History of degenerative disc disease    Hyperlipidemia    Hypertension    NSTEMI (non-ST elevated myocardial infarction) (HCC)    Pneumonia    Sciatic nerve pain    Sleep apnea    Stroke ([place])    no residual, x 3 last one was in 2020   Thoracic ascending aortic aneurysm (HCC)    4.2 cm 08/03/21 CTA chest    Past Surgical History:  Procedure Laterality Date   AV FISTULA PLACEMENT Left 07/17/2021   Procedure: CREATION  OF LEFT ARM RADIOCEPHALIC ARTERIOVENOUS (AV) FISTULA;  Surgeon: Serafina Mitchell, MD;  Location[facility];  Service: Vascular;  Laterality: Left;   CARDIAC CATHETERIZATION     IR FLUORO GUIDE CV LINE RIGHT   05/20/2021   IR US GUIDE VASC ACCESS RIGHT  05/20/2021   LIGATION OF COMPETING BRANCHES OF ARTERIOVENOUS FISTULA Left 09/13/2021   Procedure: LIGATION AND ELEVATION OF COMPETING BRANCHES OF LEFT ARM ARTERIOVENOUS FISTULA;  Surgeon: Serafina Mitchell, MD;  Location[facility];  Service: Vascular;  Laterality: Left;    Family History  Problem Relation Age of Onset   Heart attack Mother    Hypertension Mother    Diabetes Mother    Heart disease Sister    Hypertension Sister    Hypertension Father    Emphysema Father     Social History   Occupational History   Occupation: disabled  Tobacco Use   Smoking status: Every Day    Packs/day: 0.50    Years: 40.00    Total pack years: 20.00    Types: Cigarettes   Smokeless tobacco: Never   Tobacco comments:    Started smoking again in April.  1/2 ppd.    Vaping Use   Vaping Use: Never used  Substance and Sexual Activity   Alcohol use: Yes    Comment: very little   Drug use: Not Currently    Types: Cocaine    Comment: "years ago" per patient   Sexual activity: Yes  Physical Exam: Blood pressure (!) 150/90, pulse (!) 102, height 5\' 11"  (1.803 m), weight 255 lb (115.7 kg), SpO2 96 %.  Gen:      No acute distress. Falling asleep. ENT:  mallampati IV Lungs:    No increased respiratory effort, symmetric chest wall excursion, clear to auscultation bilaterally, no wheezes or crackles CV:         Regular rate and rhythm; no murmurs, rubs, or gallops.  No pedal edema   Data Reviewed: Imaging: I have personally reviewed the CTPE study April 2023 no acute PE = stable 3-4 mm pumonary nodules unchanged from 2020.     PFTs:     Latest Ref Rng & Units 09/04/2021    9:39 AM  PFT Results  FVC-Pre L 2.91   FVC-Predicted Pre % 69   FVC-Post L 3.36   FVC-Predicted Post % 80   Pre FEV1/FVC % % 80   Post FEV1/FCV % % 78   FEV1-Pre L 2.34   FEV1-Predicted Pre % 69   FEV1-Post L 2.63   DLCO uncorrected ml/min/mmHg 20.47   DLCO UNC% %  69   DLCO corrected ml/min/mmHg 23.76   DLCO COR %Predicted % 80   DLVA Predicted % 100    I have personally reviewed the patient's PFTs and show no airflow limitation. He does have a significant BD response. His diffusion capacity was mildly reduced.   Labs:  Immunization status: Immunization History  Administered Date(s) Administered   PFIZER(Purple Top)SARS-COV-2 Vaccination 12/24/2019, 01/14/2020   Pneumococcal Polysaccharide-23 02/13/2017    External Records Personally Reviewed: ED visit  Assessment:  Shortness of breath - multifactorial from mild COPD, ESRD, CHF,  Severe OSA - bipap pending Tobacco use disorder COPD  Plan/Recommendations:  Sent symbicort and albuterol to pharmacy - he will call and have them mail order.  No evidence of exacerbation at this time.  Start using bipap therapy - follow up 31-90 days after use.    Return to Care: Return in about 2 months (around 02/04/2022).   Lenice Llamas, MD Pulmonary and [place]

## 2021-12-05 NOTE — Patient Instructions (Signed)
Please schedule follow up scheduled with myself in 2 months.  If my schedule is not open yet, we will contact you with a reminder closer to that time. Please call [phone] if you haven't heard from [place] a month before.   Make sure you see me between 31-90 days of using your Bipap machine.   I have sent symbicort and albuterol to your pharmacy - please call them and they will mail it to you.

## 2021-12-06 ENCOUNTER — Telehealth (HOSPITAL_COMMUNITY): Payer: Self-pay | Admitting: Radiology

## 2021-12-06 ENCOUNTER — Other Ambulatory Visit: Payer: Self-pay

## 2021-12-06 NOTE — Telephone Encounter (Signed)
Patient given detailed instructions per Myocardial Perfusion Study Information Sheet for the test on 8/9 at 10:00. Patient notified to arrive 15 minutes early and that it is imperative to arrive on time for appointment to keep from having the test rescheduled.  If you need to cancel or reschedule your appointment, please call the office within 24 hours of your appointment. . Patient verbalized understanding.EHK

## 2021-12-10 DIAGNOSIS — D689 Coagulation defect, unspecified: Secondary | ICD-10-CM | POA: Diagnosis not present

## 2021-12-10 DIAGNOSIS — Z992 Dependence on renal dialysis: Secondary | ICD-10-CM | POA: Diagnosis not present

## 2021-12-10 DIAGNOSIS — N2581 Secondary hyperparathyroidism of renal origin: Secondary | ICD-10-CM | POA: Diagnosis not present

## 2021-12-10 DIAGNOSIS — N186 End stage renal disease: Secondary | ICD-10-CM | POA: Diagnosis not present

## 2021-12-10 DIAGNOSIS — E876 Hypokalemia: Secondary | ICD-10-CM | POA: Diagnosis not present

## 2021-12-11 ENCOUNTER — Ambulatory Visit (HOSPITAL_COMMUNITY): Payer: Medicare Other | Attending: Adult Health

## 2021-12-11 ENCOUNTER — Ambulatory Visit (HOSPITAL_COMMUNITY): Payer: Medicare Other

## 2021-12-11 DIAGNOSIS — I10 Essential (primary) hypertension: Secondary | ICD-10-CM | POA: Insufficient documentation

## 2021-12-11 DIAGNOSIS — I2510 Atherosclerotic heart disease of native coronary artery without angina pectoris: Secondary | ICD-10-CM | POA: Insufficient documentation

## 2021-12-11 LAB — MYOCARDIAL PERFUSION IMAGING
Estimated workload: 4.6
Exercise duration (min): 1 min
LV dias vol: 293 mL (ref 62–150)
LV sys vol: 201 mL
MPHR: 170 {beats}/min
Nuc Stress EF: 32 %
Peak HR: 96 {beats}/min
Percent HR: 56 %
RPE: 20
Rest HR: 68 {beats}/min
Rest Nuclear Isotope Dose: 10.8 mCi
SDS: 5
SRS: 0
SSS: 5
ST Depression (mm): 0 mm
Stress Nuclear Isotope Dose: 32 mCi
TID: 1.04

## 2021-12-11 MED ORDER — TECHNETIUM TC 99M TETROFOSMIN IV KIT
30.7000 | PACK | Freq: Once | INTRAVENOUS | Status: AC | PRN
  Administered 2021-12-11: 32 via INTRAVENOUS

## 2021-12-11 MED ORDER — TECHNETIUM TC 99M TETROFOSMIN IV KIT
10.3000 | PACK | Freq: Once | INTRAVENOUS | Status: AC | PRN
  Administered 2021-12-11: 10.8 via INTRAVENOUS

## 2021-12-11 MED ORDER — REGADENOSON 0.4 MG/5ML IV SOLN
0.4000 mg | Freq: Once | INTRAVENOUS | Status: AC
  Administered 2021-12-11: 0.4 mg via INTRAVENOUS

## 2021-12-12 ENCOUNTER — Ambulatory Visit (HOSPITAL_COMMUNITY): Payer: Medicare Other

## 2021-12-12 ENCOUNTER — Other Ambulatory Visit: Payer: Self-pay

## 2021-12-12 DIAGNOSIS — N186 End stage renal disease: Secondary | ICD-10-CM | POA: Diagnosis not present

## 2021-12-12 DIAGNOSIS — D689 Coagulation defect, unspecified: Secondary | ICD-10-CM | POA: Diagnosis not present

## 2021-12-12 DIAGNOSIS — N2581 Secondary hyperparathyroidism of renal origin: Secondary | ICD-10-CM | POA: Diagnosis not present

## 2021-12-12 DIAGNOSIS — Z992 Dependence on renal dialysis: Secondary | ICD-10-CM | POA: Diagnosis not present

## 2021-12-12 DIAGNOSIS — E876 Hypokalemia: Secondary | ICD-10-CM | POA: Diagnosis not present

## 2021-12-13 ENCOUNTER — Telehealth: Payer: Self-pay

## 2021-12-13 NOTE — Telephone Encounter (Addendum)
-  Called patient regarding results. Spoke with patients spouse Romelle Starcher. Advised spouse of results. Spouse had understanding of results. Advised patient will need to schedule appointment with primary cardiologist. Spouse stated will call back to schedule appointment.---- Message from Lendon Colonel, NP sent at 12/13/2021  7:19 AM EDT ----- Very abnormal stress test due to low EF and enlarged ventricle.  Will need to be seen by cardiologist for new regimen of treatment. Please make appointment with primary cardiologist.

## 2021-12-17 DIAGNOSIS — N186 End stage renal disease: Secondary | ICD-10-CM | POA: Diagnosis not present

## 2021-12-17 DIAGNOSIS — D689 Coagulation defect, unspecified: Secondary | ICD-10-CM | POA: Diagnosis not present

## 2021-12-17 DIAGNOSIS — N2581 Secondary hyperparathyroidism of renal origin: Secondary | ICD-10-CM | POA: Diagnosis not present

## 2021-12-17 DIAGNOSIS — E876 Hypokalemia: Secondary | ICD-10-CM | POA: Diagnosis not present

## 2021-12-17 DIAGNOSIS — Z992 Dependence on renal dialysis: Secondary | ICD-10-CM | POA: Diagnosis not present

## 2021-12-18 ENCOUNTER — Telehealth: Payer: Self-pay

## 2021-12-18 NOTE — Telephone Encounter (Addendum)
Called patient regarding results. Patient has scheduled appointment with Dr. Audie Box on 8/18 10 AM.----- Message from Lendon Colonel, NP sent at 12/13/2021  7:19 AM EDT ----- Very abnormal stress test due to low EF and enlarged ventricle.  Will need to be seen by cardiologist for new regimen of treatment. Please make appointment with primary cardiologist.

## 2021-12-19 DIAGNOSIS — Z992 Dependence on renal dialysis: Secondary | ICD-10-CM | POA: Diagnosis not present

## 2021-12-19 DIAGNOSIS — N2581 Secondary hyperparathyroidism of renal origin: Secondary | ICD-10-CM | POA: Diagnosis not present

## 2021-12-19 DIAGNOSIS — D689 Coagulation defect, unspecified: Secondary | ICD-10-CM | POA: Diagnosis not present

## 2021-12-19 DIAGNOSIS — E876 Hypokalemia: Secondary | ICD-10-CM | POA: Diagnosis not present

## 2021-12-19 DIAGNOSIS — N186 End stage renal disease: Secondary | ICD-10-CM | POA: Diagnosis not present

## 2021-12-19 NOTE — Progress Notes (Deleted)
Cardiology Office Note:   Date:  12/19/2021  NAME:  Matthew Odom    MRN: [id number] DOB:  03-Nov-1970   PCP:  Camillia Herter, NP  Cardiologist:  Evalina Field, MD  Electrophysiologist:  None   Referring MD: Camillia Herter, NP   No chief complaint on file. ***  History of Present Illness:   Matthew Odom is a 51 y.o. male with a hx of systolic HF, ESRD on HD, DM, HTN, OSA, SVT who presents for follow-up.   Problem List 1. HTN 2. Systolic HF -EF 08-65% -LHC 02/2016 [facility] -30% RCA, normal LAD/LCX 3. ESRD on HD 4. DM -A1c 8.3 -T chol 244, LDL 163, HDL 52, TG 157 5. Cocaine use  6. OSA 7. SVT -Dx 11/2011 -> adenosine sensitive   Past Medical History: Past Medical History:  Diagnosis Date   Anxiety    Cocaine use    07/15/21- "last time was more than 10 years ago."   Complication of anesthesia    wakes up during surgery   COPD (chronic obstructive pulmonary disease) ([place])    Coronary artery disease 2021   mild non obstructed   Diabetes mellitus without complication (HCC)    Enlarged heart    ESRD (end stage renal disease) ([place])    TTHSAT   History of degenerative disc disease    Hyperlipidemia    Hypertension    NSTEMI (non-ST elevated myocardial infarction) ([place])    Pneumonia    Sciatic nerve pain    Sleep apnea    Stroke ([place])    no residual, x 3 last one was in 2020   Thoracic ascending aortic aneurysm (HCC)    4.2 cm 08/03/21 CTA chest    Past Surgical History: Past Surgical History:  Procedure Laterality Date   AV FISTULA PLACEMENT Left 07/17/2021   Procedure: CREATION  OF LEFT ARM RADIOCEPHALIC ARTERIOVENOUS (AV) FISTULA;  Surgeon: Serafina Mitchell, MD;  Location: [facility];  Service: Vascular;  Laterality: Left;   CARDIAC CATHETERIZATION     IR FLUORO GUIDE CV LINE RIGHT  05/20/2021   IR US GUIDE VASC ACCESS RIGHT  05/20/2021   LIGATION OF COMPETING BRANCHES OF ARTERIOVENOUS FISTULA Left 09/13/2021   Procedure: LIGATION AND ELEVATION OF COMPETING  BRANCHES OF LEFT ARM ARTERIOVENOUS FISTULA;  Surgeon: Serafina Mitchell, MD;  Location: [facility];  Service: Vascular;  Laterality: Left;    Current Medications: No outpatient medications have been marked as taking for the 12/20/21 encounter (Appointment) with Geralynn Rile, MD.     Allergies:    Aspirin and Nitroglycerin   Social History: Social History   Socioeconomic History   Marital status: Married    Spouse name: Not on file   Number of children: Not on file   Years of education: Not on file   Highest education level: Not on file  Occupational History   Occupation: disabled  Tobacco Use   Smoking status: Every Day    Packs/day: 0.50    Years: 40.00    Total pack years: 20.00    Types: Cigarettes   Smokeless tobacco: Never   Tobacco comments:    Started smoking again in April.  1/2 ppd.    Vaping Use   Vaping Use: Never used  Substance and Sexual Activity   Alcohol use: Yes    Comment: very little   Drug use: Not Currently    Types: Cocaine    Comment: "years ago" per patient   Sexual  activity: Yes  Other Topics Concern   Not on file  Social History Narrative   Not on file   Social Determinants of Health   Financial Resource Strain: Not on file  Food Insecurity: Not on file  Transportation Needs: Not on file  Physical Activity: Not on file  Stress: Not on file  Social Connections: Not on file     Family History: The patient's ***family history includes Diabetes in his mother; Emphysema in his father; Heart attack in his mother; Heart disease in his sister; Hypertension in his father, mother, and sister.  ROS:   All other ROS reviewed and negative. Pertinent positives noted in the HPI.     EKGs/Labs/Other Studies Reviewed:   The following studies were personally reviewed by me today:  EKG:  EKG is *** ordered today.  The ekg ordered today demonstrates ***, and was personally reviewed by me.   NM Stress 12/11/2021 Fixed perfusion defect at apex and  apical inferior wall, suggesting prior infarct vs artifact. Moderate LV systolic dysfunction (EF 85%) and severe LV dilatation High risk study due to systolic dysfunction.  No ischemia  TTE 11/13/2021  1. Left ventricular ejection fraction, by estimation, is 45 to 50%. The  left ventricle has mildly decreased function. The left ventricle has no  regional wall motion abnormalities. The left ventricular internal cavity  size was mildly dilated. There is  severe concentric left ventricular hypertrophy. Left ventricular diastolic  parameters are consistent with Grade I diastolic dysfunction (impaired  relaxation). Elevated left ventricular end-diastolic pressure.   2. Right ventricular systolic function is normal. The right ventricular  size is normal. Tricuspid regurgitation signal is inadequate for assessing  PA pressure.   3. The mitral valve is normal in structure. No evidence of mitral valve  regurgitation. No evidence of mitral stenosis.   4. The aortic valve is tricuspid. Aortic valve regurgitation is not  visualized. Aortic valve sclerosis/calcification is present, without any  evidence of aortic stenosis.   5. Aortic dilatation noted. There is mild dilatation of the aortic root,  measuring 39 mm. There is mild dilatation of the ascending aorta,  measuring 41 mm.   6. The inferior vena cava is normal in size with greater than 50%  respiratory variability, suggesting right atrial pressure of 3 mmHg.   Recent Labs: 01/29/2021: TSH 1.850 05/09/2021: ALT 13 11/13/2021: BUN 46; Creatinine, Ser 4.89; Hemoglobin 11.8; Magnesium 1.5; Platelets 214; Potassium 3.9; Sodium 140   Recent Lipid Panel    Component Value Date/Time   CHOL 220 (H) 01/29/2021 1132   TRIG 331 (H) 01/29/2021 1132   HDL 35 (L) 01/29/2021 1132   CHOLHDL 6.3 (H) 01/29/2021 1132   CHOLHDL 6.1 01/03/2018 0335   VLDL 34 01/03/2018 0335   LDLCALC 126 (H) 01/29/2021 1132    Physical Exam:   VS:  There were no vitals  taken for this visit.   Wt Readings from Last 3 Encounters:  12/11/21 255 lb (115.7 kg)  12/05/21 255 lb (115.7 kg)  11/22/21 259 lb 3.2 oz (117.6 kg)    General: Well nourished, well developed, in no acute distress Head: Atraumatic, normal size  Eyes: PEERLA, EOMI  Neck: Supple, no JVD Endocrine: No thryomegaly Cardiac: Normal S1, S2; RRR; no murmurs, rubs, or gallops Lungs: Clear to auscultation bilaterally, no wheezing, rhonchi or rales  Abd: Soft, nontender, no hepatomegaly  Ext: No edema, pulses 2+ Musculoskeletal: No deformities, BUE and BLE strength normal and equal Skin: Warm and dry, no  rashes   Neuro: Alert and oriented to person, place, time, and situation, CNII-XII grossly intact, no focal deficits  Psych: Normal mood and affect   ASSESSMENT:   Matthew Odom is a 51 y.o. male who presents for the following: No diagnosis found.  PLAN:   There are no diagnoses linked to this encounter.  {Are you ordering a CV Procedure (e.g. stress test, cath, DCCV, TEE, etc)?   Press F2        :[id number]}  Disposition: No follow-ups on file.  Medication Adjustments/Labs and Tests Ordered: Current medicines are reviewed at length with the patient today.  Concerns regarding medicines are outlined above.  No orders of the defined types were placed in this encounter.  No orders of the defined types were placed in this encounter.   There are no Patient Instructions on file for this visit.   Time Spent with Patient: I have spent a total of *** minutes with patient reviewing hospital notes, telemetry, EKGs, labs and examining the patient as well as establishing an assessment and plan that was discussed with the patient.  > 50% of time was spent in direct patient care.  Signed, Addison Naegeli. Audie Box, MD, [facility]  [address] [place], [place] [address] [phone]  12/19/2021 5:20 PM

## 2021-12-20 ENCOUNTER — Ambulatory Visit: Payer: Medicare Other | Admitting: Cardiovascular Disease

## 2021-12-21 DIAGNOSIS — E876 Hypokalemia: Secondary | ICD-10-CM | POA: Diagnosis not present

## 2021-12-21 DIAGNOSIS — N2581 Secondary hyperparathyroidism of renal origin: Secondary | ICD-10-CM | POA: Diagnosis not present

## 2021-12-21 DIAGNOSIS — Z992 Dependence on renal dialysis: Secondary | ICD-10-CM | POA: Diagnosis not present

## 2021-12-21 DIAGNOSIS — N186 End stage renal disease: Secondary | ICD-10-CM | POA: Diagnosis not present

## 2021-12-21 DIAGNOSIS — D689 Coagulation defect, unspecified: Secondary | ICD-10-CM | POA: Diagnosis not present

## 2021-12-24 DIAGNOSIS — N2581 Secondary hyperparathyroidism of renal origin: Secondary | ICD-10-CM | POA: Diagnosis not present

## 2021-12-24 DIAGNOSIS — E876 Hypokalemia: Secondary | ICD-10-CM | POA: Diagnosis not present

## 2021-12-24 DIAGNOSIS — N186 End stage renal disease: Secondary | ICD-10-CM | POA: Diagnosis not present

## 2021-12-24 DIAGNOSIS — Z992 Dependence on renal dialysis: Secondary | ICD-10-CM | POA: Diagnosis not present

## 2021-12-24 DIAGNOSIS — D689 Coagulation defect, unspecified: Secondary | ICD-10-CM | POA: Diagnosis not present

## 2021-12-26 DIAGNOSIS — Z992 Dependence on renal dialysis: Secondary | ICD-10-CM | POA: Diagnosis not present

## 2021-12-26 DIAGNOSIS — E876 Hypokalemia: Secondary | ICD-10-CM | POA: Diagnosis not present

## 2021-12-26 DIAGNOSIS — D689 Coagulation defect, unspecified: Secondary | ICD-10-CM | POA: Diagnosis not present

## 2021-12-26 DIAGNOSIS — N2581 Secondary hyperparathyroidism of renal origin: Secondary | ICD-10-CM | POA: Diagnosis not present

## 2021-12-26 DIAGNOSIS — N186 End stage renal disease: Secondary | ICD-10-CM | POA: Diagnosis not present

## 2021-12-28 DIAGNOSIS — E876 Hypokalemia: Secondary | ICD-10-CM | POA: Diagnosis not present

## 2021-12-28 DIAGNOSIS — D689 Coagulation defect, unspecified: Secondary | ICD-10-CM | POA: Diagnosis not present

## 2021-12-28 DIAGNOSIS — N2581 Secondary hyperparathyroidism of renal origin: Secondary | ICD-10-CM | POA: Diagnosis not present

## 2021-12-28 DIAGNOSIS — N186 End stage renal disease: Secondary | ICD-10-CM | POA: Diagnosis not present

## 2021-12-28 DIAGNOSIS — Z992 Dependence on renal dialysis: Secondary | ICD-10-CM | POA: Diagnosis not present

## 2021-12-31 DIAGNOSIS — N186 End stage renal disease: Secondary | ICD-10-CM | POA: Diagnosis not present

## 2021-12-31 DIAGNOSIS — N2581 Secondary hyperparathyroidism of renal origin: Secondary | ICD-10-CM | POA: Diagnosis not present

## 2021-12-31 DIAGNOSIS — D689 Coagulation defect, unspecified: Secondary | ICD-10-CM | POA: Diagnosis not present

## 2021-12-31 DIAGNOSIS — Z992 Dependence on renal dialysis: Secondary | ICD-10-CM | POA: Diagnosis not present

## 2021-12-31 DIAGNOSIS — E876 Hypokalemia: Secondary | ICD-10-CM | POA: Diagnosis not present

## 2022-01-02 NOTE — Progress Notes (Deleted)
Cardiology Clinic Note   Patient Name: Matthew Odom Date of Encounter: 01/02/2022  Primary Care Provider:  Camillia Herter, NP Primary Cardiologist:  Evalina Field, MD  Patient Profile    49 yea old male with history of hypertension, CHF, type 2 diabetes, ongoing tobacco abuse, thoracic ascending aortic aneurysm (4.2 cm on 08/03/2021 per CTA of the chest), end-stage renal disease on hemodialysis, PSVT (hospitalization on 11/21/2021), OSA on CPAP and COPD.    Echocardiogram revealed an EF of 45 to 50% with severe left ventricular hypertrophy and grade 1 diastolic dysfunction.  He was treated with a adenosin and carvedilol and return to normal sinus rhythm during that hospitalization for SVT.  Last seen in the office on 11/22/2021 and was stable, however, due to reduced EF the patient was planned for stress Myoview for diagnostic prognostic purposes.  Nuclear medicine stress test dated 12/11/2020 revealed that the study was high risk and findings were consistent with prior myocardial infarction.  He was found to have abnormal wall motion in the area of a small defect with mild reduction in uptake present in the apical inferior and apex location that was fixed LVEF is 69% with end-diastolic cavity severely enlarged and end-systolic cavity size severely enlarged.  Past Medical History    Past Medical History:  Diagnosis Date   Anxiety    Cocaine use    07/15/21- "last time was more than 10 years ago."   Complication of anesthesia    wakes up during surgery   COPD (chronic obstructive pulmonary disease) ([place])    Coronary artery disease 2021   mild non obstructed   Diabetes mellitus without complication (HCC)    Enlarged heart    ESRD (end stage renal disease) (HCC)    TTHSAT   History of degenerative disc disease    Hyperlipidemia    Hypertension    NSTEMI (non-ST elevated myocardial infarction) (HCC)    Pneumonia    Sciatic nerve pain    Sleep apnea    Stroke ([place])    no residual, x  3 last one was in 2020   Thoracic ascending aortic aneurysm (HCC)    4.2 cm 08/03/21 CTA chest   Past Surgical History:  Procedure Laterality Date   AV FISTULA PLACEMENT Left 07/17/2021   Procedure: CREATION  OF LEFT ARM RADIOCEPHALIC ARTERIOVENOUS (AV) FISTULA;  Surgeon: Serafina Mitchell, MD;  Location: [facility];  Service: Vascular;  Laterality: Left;   CARDIAC CATHETERIZATION     IR FLUORO GUIDE CV LINE RIGHT  05/20/2021   IR US GUIDE VASC ACCESS RIGHT  05/20/2021   LIGATION OF COMPETING BRANCHES OF ARTERIOVENOUS FISTULA Left 09/13/2021   Procedure: LIGATION AND ELEVATION OF COMPETING BRANCHES OF LEFT ARM ARTERIOVENOUS FISTULA;  Surgeon: Serafina Mitchell, MD;  Location: [facility];  Service: Vascular;  Laterality: Left;    Allergies  Allergies  Allergen Reactions   Aspirin Hives   Nitroglycerin Other (See Comments)    Nitroglycerin paste or IV will make blood pressure go up and cause headaches. The nitroglycerin tablets do fine.    History of Present Illness    Mr. Matthew Odom comes today for ongoing assessment and management of hypertension, CHF, type 2 diabetes, ongoing tobacco abuse, thoracic ascending aortic aneurysm (4.2 cm on 08/03/2021 per CTA of the chest). Was found to have abnormal NM stress test with severely reduced EF of 32%. No ischemia.   Home Medications    Current Outpatient Medications  Medication Sig Dispense Refill  acetaminophen (TYLENOL) 500 MG tablet Take 500-1,000 mg by mouth every 6 (six) hours as needed (pain.).     albuterol (PROVENTIL) (2.5 MG/3ML) 0.083% nebulizer solution Take 3 mLs (2.5 mg total) by nebulization every 6 (six) hours as needed for wheezing or shortness of breath. 150 mL 2   amLODipine (NORVASC) 10 MG tablet Take 1 tablet (10 mg total) by mouth daily. 30 tablet 2   atorvastatin (LIPITOR) 80 MG tablet Take 1 tablet (80 mg total) by mouth daily. 30 tablet 2   budesonide-formoterol (SYMBICORT) 160-4.5 MCG/ACT inhaler Inhale 2 puffs into the lungs daily.  10.2 g 5   calcium acetate (PHOSLO) 667 MG capsule Take 2 capsules (1,334 mg total) by mouth 3 (three) times daily with meals 540 capsule 3   Calcium Carbonate Antacid (TUMS PO) Take 2 tablets by mouth 3 (three) times daily with meals.     carvedilol (COREG) 25 MG tablet Take 1 tablet (25 mg total) by mouth 2 (two) times daily with a meal. 60 tablet 2   doxazosin (CARDURA) 8 MG tablet Take 1 tablet (8 mg total) by mouth daily. 30 tablet 2   hydrALAZINE (APRESOLINE) 100 MG tablet Take 1 tablet (100 mg total) by mouth 3 (three) times daily. Take 1 tablet by mouth three times daily 90 tablet 2   Insulin Glargine (BASAGLAR KWIKPEN) 100 UNIT/ML Inject 20 Units into the skin daily. (Patient taking differently: Inject 10 Units into the skin 2 (two) times daily.) 30 mL 3   Insulin Pen Needle 31G X 6 MM MISC Use as directed in the morning, at noon, in the evening, and at bedtime. 400 each 3   liraglutide (VICTOZA) 18 MG/3ML SOPN Inject 1.8 mg into the skin daily. 27 mL 3   Misc. Devices MISC Please provide patient with insurance approved CPAP with the following settings:  autopap 15-20.  Large size [employer] Full Face Mask Forma mask and heated humidification. (Patient taking differently: Please provide patient with insurance approved CPAP with the following settings:  autopap 15-20.  Large size [employer] Full Face Mask Forma mask and heated humidification.) 1 each 0   nicotine (NICODERM CQ - DOSED IN MG/24 HOURS) 14 mg/24hr patch Place 1 patch (14 mg total) onto the skin daily as needed (tobacco dependence). 28 patch 0   nitroGLYCERIN (NITROSTAT) 0.4 MG SL tablet Place 1 tablet (0.4 mg total) under the tongue every 5 (five) minutes as needed for up to 30 doses for chest pain. 30 tablet 0   sertraline (ZOLOFT) 25 MG tablet Take 1 tablet (25 mg total) by mouth daily. 30 tablet 0   No current facility-administered medications for this visit.     Family History    Family History  Problem Relation  Age of Onset   Heart attack Mother    Hypertension Mother    Diabetes Mother    Heart disease Sister    Hypertension Sister    Hypertension Father    Emphysema Father    He indicated that his mother is deceased. He indicated that his father is deceased. He indicated that the status of his sister is unknown.  Social History    Social History   Socioeconomic History   Marital status: Married    Spouse name: Not on file   Number of children: Not on file   Years of education: Not on file   Highest education level: Not on file  Occupational History   Occupation: disabled  Tobacco Use   Smoking status:  Every Day    Packs/day: 0.50    Years: 40.00    Total pack years: 20.00    Types: Cigarettes   Smokeless tobacco: Never   Tobacco comments:    Started smoking again in April.  1/2 ppd.    Vaping Use   Vaping Use: Never used  Substance and Sexual Activity   Alcohol use: Yes    Comment: very little   Drug use: Not Currently    Types: Cocaine    Comment: "years ago" per patient   Sexual activity: Yes  Other Topics Concern   Not on file  Social History Narrative   Not on file   Social Determinants of Health   Financial Resource Strain: Not on file  Food Insecurity: Not on file  Transportation Needs: Not on file  Physical Activity: Not on file  Stress: Not on file  Social Connections: Not on file  Intimate Partner Violence: Not on file     Review of Systems    General:  No chills, fever, night sweats or weight changes.  Cardiovascular:  No chest pain, dyspnea on exertion, edema, orthopnea, palpitations, paroxysmal nocturnal dyspnea. Dermatological: No rash, lesions/masses Respiratory: No cough, dyspnea Urologic: No hematuria, dysuria Abdominal:   No nausea, vomiting, diarrhea, bright red blood per rectum, melena, or hematemesis Neurologic:  No visual changes, wkns, changes in mental status. All other systems reviewed and are otherwise negative except as noted  above.     Physical Exam    VS:  There were no vitals taken for this visit. , BMI There is no height or weight on file to calculate BMI.     GEN: Well nourished, well developed, in no acute distress. HEENT: normal. Neck: Supple, no JVD, carotid bruits, or masses. Cardiac: RRR, no murmurs, rubs, or gallops. No clubbing, cyanosis, edema.  Radials/DP/PT 2+ and equal bilaterally.  Respiratory:  Respirations regular and unlabored, clear to auscultation bilaterally. GI: Soft, nontender, nondistended, BS + x 4. MS: no deformity or atrophy. Skin: warm and dry, no rash. Neuro:  Strength and sensation are intact. Psych: Normal affect.  Accessory Clinical Findings    ECG personally reviewed by me today- *** - No acute changes  Lab Results  Component Value Date   WBC 8.4 11/13/2021   HGB 11.8 (L) 11/13/2021   HCT 34.6 (L) 11/13/2021   MCV 87.2 11/13/2021   PLT 214 11/13/2021   Lab Results  Component Value Date   CREATININE 4.89 (H) 11/13/2021   BUN 46 (H) 11/13/2021   NA 140 11/13/2021   K 3.9 11/13/2021   CL 103 11/13/2021   CO2 21 (L) 11/13/2021   Lab Results  Component Value Date   ALT 13 05/09/2021   AST 14 (L) 05/09/2021   ALKPHOS 95 05/09/2021   BILITOT 0.5 05/09/2021   Lab Results  Component Value Date   CHOL 220 (H) 01/29/2021   HDL 35 (L) 01/29/2021   LDLCALC 126 (H) 01/29/2021   TRIG 331 (H) 01/29/2021   CHOLHDL 6.3 (H) 01/29/2021    Lab Results  Component Value Date   HGBA1C 10.5 (A) 06/14/2021    Review of Prior Studies: NM Study 89/2023 Findings are consistent with prior myocardial infarction. The study is high risk.   No ST deviation was noted.   LV perfusion is abnormal. There is no evidence of ischemia. There is evidence of infarction. Defect 1: There is a small defect with mild reduction in uptake present in the apical inferior and apex  location(s) that is fixed. There is abnormal wall motion in the defect area. Consistent with infarction.   Left  ventricular function is abnormal. Global function is moderately reduced. Nuclear stress EF: 32 %. The left ventricular ejection fraction is moderately decreased (30-44%). End diastolic cavity size is severely enlarged. End systolic cavity size is severely enlarged.   Prior study not available for comparison.   Fixed perfusion defect at apex and apical inferior wall, suggesting prior infarct vs artifact. Moderate LV systolic dysfunction (EF 40%) and severe LV dilatation High risk study due to systolic dysfunction.  No ischemia  Echocardiogram 11/13/2021 Left ventricular ejection fraction, by estimation, is 45 to 50%. The  left ventricle has mildly decreased function. The left ventricle has no  regional wall motion abnormalities. The left ventricular internal cavity  size was mildly dilated. There is  severe concentric left ventricular hypertrophy. Left ventricular diastolic  parameters are consistent with Grade I diastolic dysfunction (impaired  relaxation). Elevated left ventricular end-diastolic pressure.   2. Right ventricular systolic function is normal. The right ventricular  size is normal. Tricuspid regurgitation signal is inadequate for assessing  PA pressure.   3. The mitral valve is normal in structure. No evidence of mitral valve  regurgitation. No evidence of mitral stenosis.   4. The aortic valve is tricuspid. Aortic valve regurgitation is not  visualized. Aortic valve sclerosis/calcification is present, without any  evidence of aortic stenosis.   5. Aortic dilatation noted. There is mild dilatation of the aortic root,  measuring 39 mm. There is mild dilatation of the ascending aorta,  measuring 41 mm.   6. The inferior vena cava is normal in size with greater than 50%  respiratory variability, suggesting right atrial pressure of 3 mmHg.   CT Angio of the Chest 08/03/2021 IMPRESSION: 1. No evidence of pulmonary embolism or other acute process. 2. Scattered 3-4 mm pulmonary  nodules in the right upper and middle lobes, unchanged from 2020. No follow-up needed if patient is low-risk (and has no known or suspected primary neoplasm). Non-contrast chest CT can be considered in 12 months if patient is high-risk. This recommendation follows the consensus statement: Guidelines for Management of Incidental Pulmonary Nodules Detected on CT Images: From the Fleischner Society 2017; Radiology 2017; 284:228-243. 3. Ascending aortic aneurysm measuring 4.2 cm. Recommend annual imaging followup by CTA or MRA. This recommendation follows 2010 ACCF/AHA/AATS/ACR/ASA/SCA/SCAI/SIR/STS/SVM Guidelines for the Diagnosis and Management of Patients with Thoracic Aortic Disease. Circulation. 2010; 121: X735-H299. Aortic aneurysm NOS (ICD10-I71.9) 4. Cardiomegaly with trace pericardial effusion.  Assessment & Plan   1.  ***    Current medicines are reviewed at length with the patient today.  I have spent *** min's  dedicated to the care of this patient on the date of this encounter to include pre-visit review of records, assessment, management and diagnostic testing,with shared decision making. Signed, Phill Myron. West Pugh, ANP, [facility]   01/02/2022 2:23 PM      Office [phone] Fax [phone]  Notice: This dictation was prepared with Dragon dictation along with smaller phrase technology. Any transcriptional errors that result from this process are unintentional and may not be corrected upon review.

## 2022-01-03 ENCOUNTER — Ambulatory Visit: Payer: Medicare Other | Attending: Adult Health | Admitting: Adult Health

## 2022-01-03 DIAGNOSIS — E1129 Type 2 diabetes mellitus with other diabetic kidney complication: Secondary | ICD-10-CM | POA: Diagnosis not present

## 2022-01-03 DIAGNOSIS — N186 End stage renal disease: Secondary | ICD-10-CM | POA: Diagnosis not present

## 2022-01-03 DIAGNOSIS — Z992 Dependence on renal dialysis: Secondary | ICD-10-CM | POA: Diagnosis not present

## 2022-01-04 DIAGNOSIS — N186 End stage renal disease: Secondary | ICD-10-CM | POA: Diagnosis not present

## 2022-01-04 DIAGNOSIS — N2581 Secondary hyperparathyroidism of renal origin: Secondary | ICD-10-CM | POA: Diagnosis not present

## 2022-01-04 DIAGNOSIS — Z992 Dependence on renal dialysis: Secondary | ICD-10-CM | POA: Diagnosis not present

## 2022-01-04 DIAGNOSIS — E876 Hypokalemia: Secondary | ICD-10-CM | POA: Diagnosis not present

## 2022-01-09 DIAGNOSIS — Z992 Dependence on renal dialysis: Secondary | ICD-10-CM | POA: Diagnosis not present

## 2022-01-09 DIAGNOSIS — N2581 Secondary hyperparathyroidism of renal origin: Secondary | ICD-10-CM | POA: Diagnosis not present

## 2022-01-09 DIAGNOSIS — N186 End stage renal disease: Secondary | ICD-10-CM | POA: Diagnosis not present

## 2022-01-09 DIAGNOSIS — E876 Hypokalemia: Secondary | ICD-10-CM | POA: Diagnosis not present

## 2022-01-14 DIAGNOSIS — N2581 Secondary hyperparathyroidism of renal origin: Secondary | ICD-10-CM | POA: Diagnosis not present

## 2022-01-14 DIAGNOSIS — N186 End stage renal disease: Secondary | ICD-10-CM | POA: Diagnosis not present

## 2022-01-14 DIAGNOSIS — E876 Hypokalemia: Secondary | ICD-10-CM | POA: Diagnosis not present

## 2022-01-14 DIAGNOSIS — Z992 Dependence on renal dialysis: Secondary | ICD-10-CM | POA: Diagnosis not present

## 2022-01-18 DIAGNOSIS — E876 Hypokalemia: Secondary | ICD-10-CM | POA: Diagnosis not present

## 2022-01-18 DIAGNOSIS — N186 End stage renal disease: Secondary | ICD-10-CM | POA: Diagnosis not present

## 2022-01-18 DIAGNOSIS — N2581 Secondary hyperparathyroidism of renal origin: Secondary | ICD-10-CM | POA: Diagnosis not present

## 2022-01-18 DIAGNOSIS — Z992 Dependence on renal dialysis: Secondary | ICD-10-CM | POA: Diagnosis not present

## 2022-01-25 ENCOUNTER — Emergency Department (HOSPITAL_COMMUNITY): Payer: Medicare Other

## 2022-01-25 ENCOUNTER — Encounter (HOSPITAL_COMMUNITY): Payer: Self-pay | Admitting: Emergency Medicine

## 2022-01-25 ENCOUNTER — Emergency Department (HOSPITAL_COMMUNITY)
Admission: EM | Admit: 2022-01-25 | Discharge: 2022-01-25 | Disposition: A | Discharge status: Home / Self Care | Payer: Medicare Other | Attending: Emergency Medicine | Admitting: Emergency Medicine

## 2022-01-25 ENCOUNTER — Other Ambulatory Visit: Payer: Self-pay

## 2022-01-25 DIAGNOSIS — R0789 Other chest pain: Secondary | ICD-10-CM | POA: Diagnosis not present

## 2022-01-25 DIAGNOSIS — Z992 Dependence on renal dialysis: Secondary | ICD-10-CM | POA: Diagnosis not present

## 2022-01-25 DIAGNOSIS — N2581 Secondary hyperparathyroidism of renal origin: Secondary | ICD-10-CM | POA: Diagnosis not present

## 2022-01-25 DIAGNOSIS — R079 Chest pain, unspecified: Secondary | ICD-10-CM | POA: Diagnosis not present

## 2022-01-25 DIAGNOSIS — E1122 Type 2 diabetes mellitus with diabetic chronic kidney disease: Secondary | ICD-10-CM | POA: Diagnosis not present

## 2022-01-25 DIAGNOSIS — R0602 Shortness of breath: Secondary | ICD-10-CM | POA: Diagnosis not present

## 2022-01-25 DIAGNOSIS — R778 Other specified abnormalities of plasma proteins: Secondary | ICD-10-CM | POA: Diagnosis not present

## 2022-01-25 DIAGNOSIS — E1165 Type 2 diabetes mellitus with hyperglycemia: Secondary | ICD-10-CM | POA: Insufficient documentation

## 2022-01-25 DIAGNOSIS — D649 Anemia, unspecified: Secondary | ICD-10-CM | POA: Diagnosis not present

## 2022-01-25 DIAGNOSIS — E876 Hypokalemia: Secondary | ICD-10-CM | POA: Diagnosis not present

## 2022-01-25 DIAGNOSIS — N186 End stage renal disease: Secondary | ICD-10-CM | POA: Insufficient documentation

## 2022-01-25 DIAGNOSIS — J449 Chronic obstructive pulmonary disease, unspecified: Secondary | ICD-10-CM | POA: Diagnosis not present

## 2022-01-25 DIAGNOSIS — Z79899 Other long term (current) drug therapy: Secondary | ICD-10-CM | POA: Diagnosis not present

## 2022-01-25 DIAGNOSIS — Z794 Long term (current) use of insulin: Secondary | ICD-10-CM | POA: Diagnosis not present

## 2022-01-25 DIAGNOSIS — I120 Hypertensive chronic kidney disease with stage 5 chronic kidney disease or end stage renal disease: Secondary | ICD-10-CM | POA: Diagnosis not present

## 2022-01-25 LAB — CBC
HCT: 36.7 % — ABNORMAL LOW (ref 39.0–52.0)
Hemoglobin: 11.8 g/dL — ABNORMAL LOW (ref 13.0–17.0)
MCH: 28.1 pg (ref 26.0–34.0)
MCHC: 32.2 g/dL (ref 30.0–36.0)
MCV: 87.4 fL (ref 80.0–100.0)
Platelets: 223 10*3/uL (ref 150–400)
RBC: 4.2 MIL/uL — ABNORMAL LOW (ref 4.22–5.81)
RDW: 14.4 % (ref 11.5–15.5)
WBC: 7.3 10*3/uL (ref 4.0–10.5)
nRBC: 0 % (ref 0.0–0.2)

## 2022-01-25 LAB — CBG MONITORING, ED: Glucose-Capillary: 202 mg/dL — ABNORMAL HIGH (ref 70–99)

## 2022-01-25 LAB — BASIC METABOLIC PANEL
Anion gap: 13 (ref 5–15)
BUN: 29 mg/dL — ABNORMAL HIGH (ref 6–20)
CO2: 24 mmol/L (ref 22–32)
Calcium: 8.2 mg/dL — ABNORMAL LOW (ref 8.9–10.3)
Chloride: 99 mmol/L (ref 98–111)
Creatinine, Ser: 3.68 mg/dL — ABNORMAL HIGH (ref 0.61–1.24)
GFR, Estimated: 19 mL/min — ABNORMAL LOW (ref >60)
Glucose, Bld: 195 mg/dL — ABNORMAL HIGH (ref 70–99)
Potassium: 3.8 mmol/L (ref 3.5–5.1)
Sodium: 136 mmol/L (ref 135–145)

## 2022-01-25 LAB — TROPONIN I (HIGH SENSITIVITY)
Troponin I (High Sensitivity): 50 ng/L — ABNORMAL HIGH (ref <18)
Troponin I (High Sensitivity): 45 ng/L — ABNORMAL HIGH (ref <18)

## 2022-01-25 LAB — MAGNESIUM: Magnesium: 1.7 mg/dL (ref 1.7–2.4)

## 2022-01-25 MED ORDER — ACETAMINOPHEN 500 MG PO TABS
1000.0000 mg | ORAL_TABLET | Freq: Four times a day (QID) | ORAL | Status: DC | PRN
  Administered 2022-01-25: 1000 mg via ORAL
  Filled 2022-01-25: qty 2

## 2022-01-25 NOTE — ED Provider Notes (Signed)
[facility] EMERGENCY DEPARTMENT Provider Note   CSN: [id number] Arrival date & time: 01/25/22  1107     History  Chief Complaint  Patient presents with   Chest Pain    RUEBEN KASSIM is a 51 y.o. male with ESRD on HD (TuThSat), COPD, HTN, HLD, COPD, T2DM, OSA on CPAP presents with chest pain.   Patient states he got about three quarters of the way through his dialysis treatment when he had acute onset severe "elephant sitting on the chest" sensation.  Received nitroglycerin at the dialysis center which helped relieve his symptoms but still has chest pain rated 4 out of 10. A/w SOB. Patient was also given ASA 324 mg prior to arrival. Patient missed Tuesday and Thursday of this week because he had to work. On evaluation, patient is sleepy but easily arousable to voice, when asked why he is sleepy he states "because of all the excitement." Denies any chest pain or SOB at this time. Denies lightheadedness, N/V, pain radiation, diaphoresis, leg swelling.    HPI     Home Medications Prior to Admission medications   Medication Sig Start Date End Date Taking? Authorizing Provider  acetaminophen (TYLENOL) 500 MG tablet Take 500-1,000 mg by mouth every 6 (six) hours as needed (pain.).    [provider]  albuterol (PROVENTIL) (2.5 MG/3ML) 0.083% nebulizer solution Take 3 mLs (2.5 mg total) by nebulization every 6 (six) hours as needed for wheezing or shortness of breath. 06/14/21   Spero Geralds, MD  amLODipine (NORVASC) 10 MG tablet Take 1 tablet (10 mg total) by mouth daily. 11/22/21 03/03/22  Lendon Colonel, NP  atorvastatin (LIPITOR) 80 MG tablet Take 1 tablet (80 mg total) by mouth daily. 11/13/21 03/03/22  Hosie Poisson, MD  budesonide-formoterol (SYMBICORT) 160-4.5 MCG/ACT inhaler Inhale 2 puffs into the lungs daily. 09/04/21   Spero Geralds, MD  calcium acetate (PHOSLO) 667 MG capsule Take 2 capsules (1,334 mg total) by mouth 3 (three) times daily with  meals 06/25/21     Calcium Carbonate Antacid (TUMS PO) Take 2 tablets by mouth 3 (three) times daily with meals.    [provider]  carvedilol (COREG) 25 MG tablet Take 1 tablet (25 mg total) by mouth 2 (two) times daily with a meal. 11/22/21 03/03/22  Lendon Colonel, NP  doxazosin (CARDURA) 8 MG tablet Take 1 tablet (8 mg total) by mouth daily. 11/22/21 03/03/22  Lendon Colonel, NP  hydrALAZINE (APRESOLINE) 100 MG tablet Take 1 tablet (100 mg total) by mouth 3 (three) times daily. Take 1 tablet by mouth three times daily 11/22/21 03/03/22  Lendon Colonel, NP  Insulin Glargine [facility]) 100 UNIT/ML Inject 20 Units into the skin daily. Patient taking differently: Inject 10 Units into the skin 2 (two) times daily. 06/19/21   Shamleffer, Melanie Crazier, MD  Insulin Pen Needle 31G X 6 MM MISC Use as directed in the morning, at noon, in the evening, and at bedtime. 06/19/21   Shamleffer, Melanie Crazier, MD  liraglutide (VICTOZA) 18 MG/3ML SOPN Inject 1.8 mg into the skin daily. 06/19/21   Shamleffer, Melanie Crazier, MD  Misc. Devices MISC Please provide patient with insurance approved CPAP with the following settings:  autopap 15-20.  Large size Fisher[employer]kel Full Face Mask Forma mask and heated humidification. Patient taking differently: Please provide patient with insurance approved CPAP with the following settings:  autopap 15-20.  Large size [employer] Full Face Mask Forma mask and heated humidification.  12/25/19   [name], NP  nicotine (NICODERM CQ - DOSED IN MG/24 HOURS) 14 mg/24hr patch Place 1 patch (14 mg total) onto the skin daily as needed (tobacco dependence). 11/22/21   Lendon Colonel, NP  nitroGLYCERIN (NITROSTAT) 0.4 MG SL tablet Place 1 tablet (0.4 mg total) under the tongue every 5 (five) minutes as needed for up to 30 doses for chest pain. 07/16/21   Wyvonnia Dusky, MD  sertraline (ZOLOFT) 25 MG tablet Take 1 tablet (25 mg total) by mouth  daily. 05/21/21 11/12/21  Camillia Herter, NP      Allergies    Aspirin and Nitroglycerin    Review of Systems   Review of Systems Review of systems negative for f/c or recent illnesses.  A 10 point review of systems was performed and is negative unless otherwise reported in HPI.  Physical Exam Updated Vital Signs BP (!) 151/75   Pulse 88   Temp 97.6 F (36.4 C) (Oral)   Resp 12   SpO2 98%  Physical Exam General: Normal appearing obese male, lying in bed.  HEENT: PERRLA, Sclera anicteric, MMM, trachea midline. Cardiology: RRR, no murmurs/rubs/gallops. BL radial and DP pulses equal bilaterally.  Resp: Normal respiratory rate and effort. CTAB, no wheezes, rhonchi, crackles.  Abd: Soft, non-tender, non-distended. No rebound tenderness or guarding.  GU: Deferred. MSK: No peripheral edema or signs of trauma. Extremities without deformity or TTP. No cyanosis or clubbing. Skin: warm, dry. No rashes or lesions. Neuro: A&Ox4, CNs II-XII grossly intact. MAEs. Sensation grossly intact.  Psych: Normal mood and affect.   ED Results / Procedures / Treatments   Labs (all labs ordered are listed, but only abnormal results are displayed) Labs Reviewed  BASIC METABOLIC PANEL - Abnormal; Notable for the following components:      Result Value   Glucose, Bld 195 (*)    BUN 29 (*)    Creatinine, Ser 3.68 (*)    Calcium 8.2 (*)    GFR, Estimated 19 (*)    All other components within normal limits  CBC - Abnormal; Notable for the following components:   RBC 4.20 (*)    Hemoglobin 11.8 (*)    HCT 36.7 (*)    All other components within normal limits  CBG MONITORING, ED - Abnormal; Notable for the following components:   Glucose-Capillary 202 (*)    All other components within normal limits  TROPONIN I (HIGH SENSITIVITY) - Abnormal; Notable for the following components:   Troponin I (High Sensitivity) 50 (*)    All other components within normal limits  TROPONIN I (HIGH SENSITIVITY) -  Abnormal; Notable for the following components:   Troponin I (High Sensitivity) 45 (*)    All other components within normal limits  MAGNESIUM    EKG EKG Interpretation  Date/Time:  Saturday January 25 2022 11:16:06 EDT Ventricular Rate:  89 PR Interval:  162 QRS Duration: 123 QT Interval:  394 QTC Calculation: 480 R Axis:   88 Text Interpretation: Sinus rhythm Probable left ventricular hypertrophy Abnormal T, consider ischemia, diffuse leads ST elevation, consider anterior injury Confirmed by Cindee Lame [phone]) on 01/25/2022 2:26:54 PM  Radiology DG Chest 2 View  Result Date: 01/25/2022 CLINICAL DATA:  Chest pain, shortness of breath EXAM: CHEST - 2 VIEW COMPARISON:  11/11/2021 FINDINGS: Transverse diameter of heart is increased. There are no signs of pulmonary edema or focal pulmonary consolidation. There is no pleural effusion or pneumothorax. Tip of central venous catheter is  seen in superior vena cava close to the right atrium. IMPRESSION: Cardiomegaly. There are no signs of pulmonary edema or focal pulmonary consolidation. Electronically Signed   By: Elmer Picker M.D.   On: 01/25/2022 12:30    Procedures Procedures    Medications Ordered in ED Medications  acetaminophen (TYLENOL) tablet 1,000 mg (1,000 mg Oral Given 01/25/22 1209)    ED Course/ Medical Decision Making/ A&P                          Medical Decision Making Amount and/or Complexity of Data Reviewed Labs: ordered. Decision-making details documented in ED Course. Radiology: ordered. Decision-making details documented in ED Course.  Risk OTC drugs.    On arrival patient is HDS, well-appearing, mainly complaining of hunger. CP, SOB have resolved.   DDX for chest pain includes but is not limited to ACS/arrhythmia,  aortic dissection, PNA, PTX, PE, GERD/PUD/gastritis, or musculoskeletal pain.  Chest pain felt like an elephant sitting on his chest and resolved with nitroglycerin which raise  concern for ACS, patient already received ASA prior to arrival, will obtain EKG and troponins.  Lower concern for aortic dissection given presenting sx, equal pulses in all extremities, and that the pain has already improved. Patient's symptoms have improved and he has no further shortness of breath, Wells criteria is negative. No abdominal pain and no c/f biliary disease. Consider GERD, given known history.   Per chart review, echo on 11/13/2021 demonstrated severe concentric LVH with grade 1 diastolic dysfunction. EKG demonstrates mild elevations in V1 through V3 that do not meet STEMI criteria given patient's known LVH.  Also with some T wave inversions in lateral leads that are consistent with prior EKGs.  Troponin is mildly elevated to 50 and repeat is 45, c/w prior values previously determined to be negative, and expect a mild elevation in high sensitivity troponins d/t ESRD.   Patient's labs demonstrate mild hyperglycemia, flat troponin, creatinine 3.68 consistent with ESRD, no leukocytosis, hemoglobin 11.8, sodium potassium within normal limits.  Chest x-ray with cardiomegaly but no other focal findings. I have personally reviewed and interpreted all labs and imaging.   With observation in the ED patient is noted to drop his sats into the mid 80s while sleeping, patient has a known diagnosis of OSA.  Clinical Course as of 01/25/22 1544  Sat Jan 25, 2022  1212 Glucose-Capillary(!): 202 [HN]  1305 DG Chest 2 View Cardiomegaly. There are no signs of pulmonary edema or focal pulmonary consolidation. [HN]  1536 Patient reevaluated and has had no recurrence of CP since his 4.5 hour stay in the ED.  Feels well and would like to be discharged home.  Patient's Heart score is 5. Patient is given extensive discharge instructions and return precautions, including to follow-up with his cardiologist within 1 week.  Patient instructed to attend all dialysis appointments as previously scheduled.  Patient reports  understanding, all questions answered to patient satisfaction.  Dispo: DC [HN]    Clinical Course User Index [HN] Audley Hose, MD          Final Clinical Impression(s) / ED Diagnoses Final diagnoses:  Chest pain, unspecified type    Rx / DC Orders ED Discharge Orders     None        This note was created using dictation software, which may contain spelling or grammatical errors.    Audley Hose, MD 01/25/22 [phone]

## 2022-01-25 NOTE — Discharge Instructions (Signed)
Thank you for coming to [facility] Emergency Department. You were seen for chest pain during dialysis. We did an exam, labs, and imaging, and these showed no acute findings. Please attend all scheduled dialysis appointments. Please follow up with your cardiologist within 1 week.   Do not hesitate to return to the ED or call 911 if you experience: -Worsening symptoms -Chest pain, shortness of breath -Lightheadedness, passing out -Fevers/chills -Anything else that concerns you

## 2022-01-25 NOTE — ED Triage Notes (Signed)
Patient BIB [facility] from dialysis center after developing left sided chest pain and SHOB 3 hours into session. Patient describes it as someone sitting on his chest, like a chest "cramp" and then simultaneously developed SHOB. The staff at dialysis administered SL nitro and patient had improvement in pain but it is still there. Patient also given 324 ASA PTA. Patient states he receives dialysis tx Tue/Thurs/Sat but missed Tuesday and Thursday of this week. VSS per EMS.

## 2022-01-28 DIAGNOSIS — E876 Hypokalemia: Secondary | ICD-10-CM | POA: Diagnosis not present

## 2022-01-28 DIAGNOSIS — N2581 Secondary hyperparathyroidism of renal origin: Secondary | ICD-10-CM | POA: Diagnosis not present

## 2022-01-28 DIAGNOSIS — N186 End stage renal disease: Secondary | ICD-10-CM | POA: Diagnosis not present

## 2022-01-28 DIAGNOSIS — Z992 Dependence on renal dialysis: Secondary | ICD-10-CM | POA: Diagnosis not present

## 2022-01-30 DIAGNOSIS — N2581 Secondary hyperparathyroidism of renal origin: Secondary | ICD-10-CM | POA: Diagnosis not present

## 2022-01-30 DIAGNOSIS — N186 End stage renal disease: Secondary | ICD-10-CM | POA: Diagnosis not present

## 2022-01-30 DIAGNOSIS — Z992 Dependence on renal dialysis: Secondary | ICD-10-CM | POA: Diagnosis not present

## 2022-01-30 DIAGNOSIS — E876 Hypokalemia: Secondary | ICD-10-CM | POA: Diagnosis not present

## 2022-02-01 DIAGNOSIS — E876 Hypokalemia: Secondary | ICD-10-CM | POA: Diagnosis not present

## 2022-02-01 DIAGNOSIS — Z992 Dependence on renal dialysis: Secondary | ICD-10-CM | POA: Diagnosis not present

## 2022-02-01 DIAGNOSIS — N186 End stage renal disease: Secondary | ICD-10-CM | POA: Diagnosis not present

## 2022-02-01 DIAGNOSIS — N2581 Secondary hyperparathyroidism of renal origin: Secondary | ICD-10-CM | POA: Diagnosis not present

## 2022-02-02 DIAGNOSIS — Z992 Dependence on renal dialysis: Secondary | ICD-10-CM | POA: Diagnosis not present

## 2022-02-02 DIAGNOSIS — E1129 Type 2 diabetes mellitus with other diabetic kidney complication: Secondary | ICD-10-CM | POA: Diagnosis not present

## 2022-02-02 DIAGNOSIS — N186 End stage renal disease: Secondary | ICD-10-CM | POA: Diagnosis not present

## 2022-02-04 DIAGNOSIS — E876 Hypokalemia: Secondary | ICD-10-CM | POA: Diagnosis not present

## 2022-02-04 DIAGNOSIS — Z992 Dependence on renal dialysis: Secondary | ICD-10-CM | POA: Diagnosis not present

## 2022-02-04 DIAGNOSIS — N2581 Secondary hyperparathyroidism of renal origin: Secondary | ICD-10-CM | POA: Diagnosis not present

## 2022-02-04 DIAGNOSIS — E1122 Type 2 diabetes mellitus with diabetic chronic kidney disease: Secondary | ICD-10-CM | POA: Diagnosis not present

## 2022-02-04 DIAGNOSIS — N186 End stage renal disease: Secondary | ICD-10-CM | POA: Diagnosis not present

## 2022-02-08 DIAGNOSIS — E1122 Type 2 diabetes mellitus with diabetic chronic kidney disease: Secondary | ICD-10-CM | POA: Diagnosis not present

## 2022-02-08 DIAGNOSIS — N2581 Secondary hyperparathyroidism of renal origin: Secondary | ICD-10-CM | POA: Diagnosis not present

## 2022-02-08 DIAGNOSIS — E876 Hypokalemia: Secondary | ICD-10-CM | POA: Diagnosis not present

## 2022-02-08 DIAGNOSIS — N186 End stage renal disease: Secondary | ICD-10-CM | POA: Diagnosis not present

## 2022-02-08 DIAGNOSIS — Z992 Dependence on renal dialysis: Secondary | ICD-10-CM | POA: Diagnosis not present

## 2022-02-10 ENCOUNTER — Ambulatory Visit: Payer: Medicare Other | Admitting: Internal Medicine

## 2022-02-15 DIAGNOSIS — Z992 Dependence on renal dialysis: Secondary | ICD-10-CM | POA: Diagnosis not present

## 2022-02-15 DIAGNOSIS — E876 Hypokalemia: Secondary | ICD-10-CM | POA: Diagnosis not present

## 2022-02-15 DIAGNOSIS — E1122 Type 2 diabetes mellitus with diabetic chronic kidney disease: Secondary | ICD-10-CM | POA: Diagnosis not present

## 2022-02-15 DIAGNOSIS — N2581 Secondary hyperparathyroidism of renal origin: Secondary | ICD-10-CM | POA: Diagnosis not present

## 2022-02-15 DIAGNOSIS — N186 End stage renal disease: Secondary | ICD-10-CM | POA: Diagnosis not present

## 2022-02-20 DIAGNOSIS — E1122 Type 2 diabetes mellitus with diabetic chronic kidney disease: Secondary | ICD-10-CM | POA: Diagnosis not present

## 2022-02-20 DIAGNOSIS — Z992 Dependence on renal dialysis: Secondary | ICD-10-CM | POA: Diagnosis not present

## 2022-02-20 DIAGNOSIS — E876 Hypokalemia: Secondary | ICD-10-CM | POA: Diagnosis not present

## 2022-02-20 DIAGNOSIS — N2581 Secondary hyperparathyroidism of renal origin: Secondary | ICD-10-CM | POA: Diagnosis not present

## 2022-02-20 DIAGNOSIS — N186 End stage renal disease: Secondary | ICD-10-CM | POA: Diagnosis not present

## 2022-02-22 DIAGNOSIS — E1122 Type 2 diabetes mellitus with diabetic chronic kidney disease: Secondary | ICD-10-CM | POA: Diagnosis not present

## 2022-02-22 DIAGNOSIS — N2581 Secondary hyperparathyroidism of renal origin: Secondary | ICD-10-CM | POA: Diagnosis not present

## 2022-02-22 DIAGNOSIS — E876 Hypokalemia: Secondary | ICD-10-CM | POA: Diagnosis not present

## 2022-02-22 DIAGNOSIS — Z992 Dependence on renal dialysis: Secondary | ICD-10-CM | POA: Diagnosis not present

## 2022-02-22 DIAGNOSIS — N186 End stage renal disease: Secondary | ICD-10-CM | POA: Diagnosis not present

## 2022-02-25 DIAGNOSIS — N186 End stage renal disease: Secondary | ICD-10-CM | POA: Diagnosis not present

## 2022-02-25 DIAGNOSIS — Z992 Dependence on renal dialysis: Secondary | ICD-10-CM | POA: Diagnosis not present

## 2022-02-25 DIAGNOSIS — E876 Hypokalemia: Secondary | ICD-10-CM | POA: Diagnosis not present

## 2022-02-25 DIAGNOSIS — N2581 Secondary hyperparathyroidism of renal origin: Secondary | ICD-10-CM | POA: Diagnosis not present

## 2022-02-25 DIAGNOSIS — E1122 Type 2 diabetes mellitus with diabetic chronic kidney disease: Secondary | ICD-10-CM | POA: Diagnosis not present

## 2022-02-27 DIAGNOSIS — N186 End stage renal disease: Secondary | ICD-10-CM | POA: Diagnosis not present

## 2022-02-27 DIAGNOSIS — N2581 Secondary hyperparathyroidism of renal origin: Secondary | ICD-10-CM | POA: Diagnosis not present

## 2022-02-27 DIAGNOSIS — E876 Hypokalemia: Secondary | ICD-10-CM | POA: Diagnosis not present

## 2022-02-27 DIAGNOSIS — Z992 Dependence on renal dialysis: Secondary | ICD-10-CM | POA: Diagnosis not present

## 2022-02-27 DIAGNOSIS — E1122 Type 2 diabetes mellitus with diabetic chronic kidney disease: Secondary | ICD-10-CM | POA: Diagnosis not present

## 2022-02-28 DIAGNOSIS — Z992 Dependence on renal dialysis: Secondary | ICD-10-CM | POA: Diagnosis not present

## 2022-02-28 DIAGNOSIS — N186 End stage renal disease: Secondary | ICD-10-CM | POA: Diagnosis not present

## 2022-03-04 DIAGNOSIS — E876 Hypokalemia: Secondary | ICD-10-CM | POA: Diagnosis not present

## 2022-03-04 DIAGNOSIS — Z992 Dependence on renal dialysis: Secondary | ICD-10-CM | POA: Diagnosis not present

## 2022-03-04 DIAGNOSIS — N186 End stage renal disease: Secondary | ICD-10-CM | POA: Diagnosis not present

## 2022-03-04 DIAGNOSIS — N2581 Secondary hyperparathyroidism of renal origin: Secondary | ICD-10-CM | POA: Diagnosis not present

## 2022-03-04 DIAGNOSIS — E1122 Type 2 diabetes mellitus with diabetic chronic kidney disease: Secondary | ICD-10-CM | POA: Diagnosis not present

## 2022-03-05 DIAGNOSIS — Z992 Dependence on renal dialysis: Secondary | ICD-10-CM | POA: Diagnosis not present

## 2022-03-05 DIAGNOSIS — I871 Compression of vein: Secondary | ICD-10-CM | POA: Diagnosis not present

## 2022-03-05 DIAGNOSIS — N186 End stage renal disease: Secondary | ICD-10-CM | POA: Diagnosis not present

## 2022-03-05 DIAGNOSIS — T82858A Stenosis of vascular prosthetic devices, implants and grafts, initial encounter: Secondary | ICD-10-CM | POA: Diagnosis not present

## 2022-03-06 DIAGNOSIS — Z992 Dependence on renal dialysis: Secondary | ICD-10-CM | POA: Diagnosis not present

## 2022-03-06 DIAGNOSIS — N2581 Secondary hyperparathyroidism of renal origin: Secondary | ICD-10-CM | POA: Diagnosis not present

## 2022-03-06 DIAGNOSIS — N186 End stage renal disease: Secondary | ICD-10-CM | POA: Diagnosis not present

## 2022-03-11 DIAGNOSIS — N2581 Secondary hyperparathyroidism of renal origin: Secondary | ICD-10-CM | POA: Diagnosis not present

## 2022-03-11 DIAGNOSIS — N186 End stage renal disease: Secondary | ICD-10-CM | POA: Diagnosis not present

## 2022-03-11 DIAGNOSIS — Z992 Dependence on renal dialysis: Secondary | ICD-10-CM | POA: Diagnosis not present

## 2022-03-15 DIAGNOSIS — N186 End stage renal disease: Secondary | ICD-10-CM | POA: Diagnosis not present

## 2022-03-15 DIAGNOSIS — Z992 Dependence on renal dialysis: Secondary | ICD-10-CM | POA: Diagnosis not present

## 2022-03-15 DIAGNOSIS — N2581 Secondary hyperparathyroidism of renal origin: Secondary | ICD-10-CM | POA: Diagnosis not present

## 2022-03-18 ENCOUNTER — Other Ambulatory Visit: Payer: Self-pay

## 2022-03-18 MED ORDER — SEVELAMER CARBONATE 800 MG PO TABS
1600.0000 mg | ORAL_TABLET | Freq: Three times a day (TID) | ORAL | 11 refills | Status: DC
  Filled 2022-03-18 – 2022-09-11 (×2): qty 540, 90d supply, fill #0

## 2022-03-19 ENCOUNTER — Other Ambulatory Visit: Payer: Self-pay

## 2022-03-20 DIAGNOSIS — N2581 Secondary hyperparathyroidism of renal origin: Secondary | ICD-10-CM | POA: Diagnosis not present

## 2022-03-20 DIAGNOSIS — Z992 Dependence on renal dialysis: Secondary | ICD-10-CM | POA: Diagnosis not present

## 2022-03-20 DIAGNOSIS — N186 End stage renal disease: Secondary | ICD-10-CM | POA: Diagnosis not present

## 2022-03-22 DIAGNOSIS — N186 End stage renal disease: Secondary | ICD-10-CM | POA: Diagnosis not present

## 2022-03-22 DIAGNOSIS — Z992 Dependence on renal dialysis: Secondary | ICD-10-CM | POA: Diagnosis not present

## 2022-03-22 DIAGNOSIS — N2581 Secondary hyperparathyroidism of renal origin: Secondary | ICD-10-CM | POA: Diagnosis not present

## 2022-03-26 ENCOUNTER — Other Ambulatory Visit: Payer: Self-pay

## 2022-04-01 DIAGNOSIS — Z992 Dependence on renal dialysis: Secondary | ICD-10-CM | POA: Diagnosis not present

## 2022-04-01 DIAGNOSIS — N2581 Secondary hyperparathyroidism of renal origin: Secondary | ICD-10-CM | POA: Diagnosis not present

## 2022-04-01 DIAGNOSIS — N186 End stage renal disease: Secondary | ICD-10-CM | POA: Diagnosis not present

## 2022-04-08 DIAGNOSIS — N2581 Secondary hyperparathyroidism of renal origin: Secondary | ICD-10-CM | POA: Diagnosis not present

## 2022-04-08 DIAGNOSIS — Z992 Dependence on renal dialysis: Secondary | ICD-10-CM | POA: Diagnosis not present

## 2022-04-08 DIAGNOSIS — N186 End stage renal disease: Secondary | ICD-10-CM | POA: Diagnosis not present

## 2022-04-12 DIAGNOSIS — Z992 Dependence on renal dialysis: Secondary | ICD-10-CM | POA: Diagnosis not present

## 2022-04-12 DIAGNOSIS — N186 End stage renal disease: Secondary | ICD-10-CM | POA: Diagnosis not present

## 2022-04-12 DIAGNOSIS — N2581 Secondary hyperparathyroidism of renal origin: Secondary | ICD-10-CM | POA: Diagnosis not present

## 2022-04-19 DIAGNOSIS — N2581 Secondary hyperparathyroidism of renal origin: Secondary | ICD-10-CM | POA: Diagnosis not present

## 2022-04-19 DIAGNOSIS — Z992 Dependence on renal dialysis: Secondary | ICD-10-CM | POA: Diagnosis not present

## 2022-04-19 DIAGNOSIS — N186 End stage renal disease: Secondary | ICD-10-CM | POA: Diagnosis not present

## 2022-04-22 DIAGNOSIS — N2581 Secondary hyperparathyroidism of renal origin: Secondary | ICD-10-CM | POA: Diagnosis not present

## 2022-04-22 DIAGNOSIS — N186 End stage renal disease: Secondary | ICD-10-CM | POA: Diagnosis not present

## 2022-04-22 DIAGNOSIS — Z992 Dependence on renal dialysis: Secondary | ICD-10-CM | POA: Diagnosis not present

## 2022-04-24 ENCOUNTER — Other Ambulatory Visit: Payer: Self-pay

## 2022-04-26 DIAGNOSIS — Z992 Dependence on renal dialysis: Secondary | ICD-10-CM | POA: Diagnosis not present

## 2022-04-26 DIAGNOSIS — N2581 Secondary hyperparathyroidism of renal origin: Secondary | ICD-10-CM | POA: Diagnosis not present

## 2022-04-26 DIAGNOSIS — N186 End stage renal disease: Secondary | ICD-10-CM | POA: Diagnosis not present

## 2022-05-01 DIAGNOSIS — N186 End stage renal disease: Secondary | ICD-10-CM | POA: Diagnosis not present

## 2022-05-01 DIAGNOSIS — N2581 Secondary hyperparathyroidism of renal origin: Secondary | ICD-10-CM | POA: Diagnosis not present

## 2022-05-01 DIAGNOSIS — Z992 Dependence on renal dialysis: Secondary | ICD-10-CM | POA: Diagnosis not present

## 2022-05-03 DIAGNOSIS — N2581 Secondary hyperparathyroidism of renal origin: Secondary | ICD-10-CM | POA: Diagnosis not present

## 2022-05-03 DIAGNOSIS — Z992 Dependence on renal dialysis: Secondary | ICD-10-CM | POA: Diagnosis not present

## 2022-05-03 DIAGNOSIS — N186 End stage renal disease: Secondary | ICD-10-CM | POA: Diagnosis not present

## 2022-05-12 ENCOUNTER — Other Ambulatory Visit: Payer: Self-pay

## 2022-05-12 ENCOUNTER — Other Ambulatory Visit: Payer: Self-pay | Admitting: Family

## 2022-05-12 ENCOUNTER — Telehealth: Payer: Self-pay | Admitting: Internal Medicine

## 2022-05-12 ENCOUNTER — Telehealth: Payer: Self-pay | Admitting: Physician Assistant

## 2022-05-12 ENCOUNTER — Other Ambulatory Visit: Payer: Self-pay | Admitting: Adult Health

## 2022-05-12 DIAGNOSIS — F411 Generalized anxiety disorder: Secondary | ICD-10-CM

## 2022-05-12 DIAGNOSIS — J449 Chronic obstructive pulmonary disease, unspecified: Secondary | ICD-10-CM

## 2022-05-12 DIAGNOSIS — I10 Essential (primary) hypertension: Secondary | ICD-10-CM

## 2022-05-12 MED ORDER — HYDRALAZINE HCL 100 MG PO TABS
100.0000 mg | ORAL_TABLET | Freq: Three times a day (TID) | ORAL | 1 refills | Status: DC
  Filled 2022-05-12 – 2022-09-11 (×2): qty 270, 90d supply, fill #0

## 2022-05-12 MED ORDER — ALBUTEROL SULFATE (2.5 MG/3ML) 0.083% IN NEBU: 2.5000 mg | INHALATION_SOLUTION | Freq: Four times a day (QID) | RESPIRATORY_TRACT | 3 refills | Status: DC | PRN

## 2022-05-12 MED ORDER — CARVEDILOL 25 MG PO TABS
25.0000 mg | ORAL_TABLET | Freq: Two times a day (BID) | ORAL | 1 refills | Status: DC
  Filled 2022-05-12 – 2022-09-11 (×2): qty 180, 90d supply, fill #0

## 2022-05-12 MED ORDER — DOXAZOSIN MESYLATE 8 MG PO TABS
8.0000 mg | ORAL_TABLET | Freq: Every day | ORAL | 1 refills | Status: DC
  Filled 2022-05-12 – 2022-09-11 (×2): qty 90, 90d supply, fill #0

## 2022-05-12 MED ORDER — AMLODIPINE BESYLATE 10 MG PO TABS
10.0000 mg | ORAL_TABLET | Freq: Every day | ORAL | 1 refills | Status: DC
  Filled 2022-05-12 – 2022-09-11 (×2): qty 90, 90d supply, fill #0

## 2022-05-12 NOTE — Telephone Encounter (Signed)
A different Pharmacy called saying they can not fill the script sent to them so the PT needs a new RX for  Albuterol w/a diagnosis code sent to:  [facility] on [address]

## 2022-05-12 NOTE — Telephone Encounter (Signed)
Pt sees Dr. Lenice Llamas at [facility], not Valaria Good, PA-C. I have refilled pt's albuterol neb sol sending it to [facility] for pt under our provider Dr. Lenice Llamas. Nothing further needed.

## 2022-05-13 NOTE — Telephone Encounter (Signed)
Requested medication (s) are due for refill today:yes  Requested medication (s) are on the active medication list:yes  Last refill:  05/21/21  Future visit scheduled: no  Notes to clinic:  Unable to refill per protocol, courtesy refill already given, routing for provider approval.      Requested Prescriptions  Pending Prescriptions Disp Refills   sertraline (ZOLOFT) 25 MG tablet 30 tablet 0    Sig: Take 1 tablet (25 mg total) by mouth daily.     Psychiatry:  Antidepressants - SSRI - sertraline Failed - 05/12/2022  2:50 PM      Failed - AST in normal range and within 360 days    AST  Date Value Ref Range Status  05/09/2021 14 (L) 15 - 41 U/L Final         Failed - ALT in normal range and within 360 days    ALT  Date Value Ref Range Status  05/09/2021 13 0 - 44 U/L Final         Failed - Valid encounter within last 6 months    Recent Outpatient Visits           11 months ago Encounter for completion of form with patient   Primary Care at [facility], Flonnie Hailstone, NP   11 months ago Hospital discharge follow-up   Primary Care at [facility], [place], NP   1 year ago Annual physical exam   Primary Care at [facility], [place], NP   1 year ago Encounter to establish care   Primary Care at [facility], [place], NP   2 years ago Essential hypertension   [place], [place] - Completed PHQ-2 or PHQ-9 in the last 360 days

## 2022-05-14 ENCOUNTER — Other Ambulatory Visit: Payer: Self-pay | Admitting: Internal Medicine

## 2022-05-14 ENCOUNTER — Other Ambulatory Visit: Payer: Self-pay | Admitting: Family

## 2022-05-14 ENCOUNTER — Other Ambulatory Visit: Payer: Self-pay

## 2022-05-14 MED ORDER — BUDESONIDE-FORMOTEROL FUMARATE 160-4.5 MCG/ACT IN AERO
2.0000 | INHALATION_SPRAY | Freq: Every day | RESPIRATORY_TRACT | 5 refills | Status: DC
  Filled 2022-05-14: qty 10.2, 30d supply, fill #0

## 2022-05-14 NOTE — Telephone Encounter (Signed)
Requested medication (s) are due for refill today: yes  Requested medication (s) are on the active medication list: yes  Last refill:  05/10/21 #30 2 RF  Future visit scheduled: no  Notes to clinic:  asking for refill   Requested Prescriptions  Pending Prescriptions Disp Refills   tamsulosin (FLOMAX) 0.4 MG CAPS capsule 30 capsule 2    Sig: Take 1 capsule (0.4 mg total) by mouth at bedtime.     Urology: Alpha-Adrenergic Blocker Failed - 05/14/2022  2:45 PM      Failed - PSA in normal range and within 360 days    No results found for: "LABPSA", "PSA", "PSA1", "ULTRAPSA"       Passed - Last BP in normal range    BP Readings from Last 1 Encounters:  01/25/22 124/82         Passed - Valid encounter within last 12 months    Recent Outpatient Visits           11 months ago Encounter for completion of form with patient   Primary Care at [facility], Flonnie Hailstone, NP   11 months ago Hospital discharge follow-up   Primary Care at [facility], [place], NP   1 year ago Annual physical exam   Primary Care at [facility], [place], NP   1 year ago Encounter to establish care   Primary Care at [facility], [place], NP   2 years ago Essential hypertension   [place], [place]

## 2022-05-15 ENCOUNTER — Other Ambulatory Visit: Payer: Self-pay

## 2022-05-15 DIAGNOSIS — E1122 Type 2 diabetes mellitus with diabetic chronic kidney disease: Secondary | ICD-10-CM | POA: Diagnosis not present

## 2022-05-16 ENCOUNTER — Telehealth: Payer: Self-pay

## 2022-05-16 ENCOUNTER — Other Ambulatory Visit (HOSPITAL_COMMUNITY): Payer: Self-pay

## 2022-05-16 ENCOUNTER — Other Ambulatory Visit: Payer: Self-pay

## 2022-05-16 NOTE — Telephone Encounter (Signed)
PA request received via CMM for Symbicort 160-4.5MCG/ACT aerosol  PA has been submitted to [employer] and is awaiting determination.  Key: [id number]

## 2022-05-16 NOTE — Telephone Encounter (Signed)
PA has been APPROVED from 05/16/2022-05/05/2023.

## 2022-05-19 ENCOUNTER — Other Ambulatory Visit: Payer: Self-pay

## 2022-05-21 ENCOUNTER — Other Ambulatory Visit: Payer: Self-pay

## 2022-05-22 ENCOUNTER — Other Ambulatory Visit: Payer: Self-pay

## 2022-05-30 ENCOUNTER — Other Ambulatory Visit: Payer: Self-pay

## 2022-06-05 DIAGNOSIS — N186 End stage renal disease: Secondary | ICD-10-CM | POA: Diagnosis not present

## 2022-06-05 DIAGNOSIS — L299 Pruritus, unspecified: Secondary | ICD-10-CM | POA: Diagnosis not present

## 2022-06-05 DIAGNOSIS — D631 Anemia in chronic kidney disease: Secondary | ICD-10-CM | POA: Diagnosis not present

## 2022-06-05 DIAGNOSIS — Z992 Dependence on renal dialysis: Secondary | ICD-10-CM | POA: Diagnosis not present

## 2022-06-05 DIAGNOSIS — D689 Coagulation defect, unspecified: Secondary | ICD-10-CM | POA: Diagnosis not present

## 2022-06-05 DIAGNOSIS — N2581 Secondary hyperparathyroidism of renal origin: Secondary | ICD-10-CM | POA: Diagnosis not present

## 2022-06-05 DIAGNOSIS — E1129 Type 2 diabetes mellitus with other diabetic kidney complication: Secondary | ICD-10-CM | POA: Diagnosis not present

## 2022-06-07 DIAGNOSIS — D689 Coagulation defect, unspecified: Secondary | ICD-10-CM | POA: Diagnosis not present

## 2022-06-07 DIAGNOSIS — N186 End stage renal disease: Secondary | ICD-10-CM | POA: Diagnosis not present

## 2022-06-07 DIAGNOSIS — N2581 Secondary hyperparathyroidism of renal origin: Secondary | ICD-10-CM | POA: Diagnosis not present

## 2022-06-07 DIAGNOSIS — Z992 Dependence on renal dialysis: Secondary | ICD-10-CM | POA: Diagnosis not present

## 2022-06-07 DIAGNOSIS — L299 Pruritus, unspecified: Secondary | ICD-10-CM | POA: Diagnosis not present

## 2022-06-07 DIAGNOSIS — D631 Anemia in chronic kidney disease: Secondary | ICD-10-CM | POA: Diagnosis not present

## 2022-06-10 DIAGNOSIS — L299 Pruritus, unspecified: Secondary | ICD-10-CM | POA: Diagnosis not present

## 2022-06-10 DIAGNOSIS — D689 Coagulation defect, unspecified: Secondary | ICD-10-CM | POA: Diagnosis not present

## 2022-06-10 DIAGNOSIS — N186 End stage renal disease: Secondary | ICD-10-CM | POA: Diagnosis not present

## 2022-06-10 DIAGNOSIS — D631 Anemia in chronic kidney disease: Secondary | ICD-10-CM | POA: Diagnosis not present

## 2022-06-10 DIAGNOSIS — N2581 Secondary hyperparathyroidism of renal origin: Secondary | ICD-10-CM | POA: Diagnosis not present

## 2022-06-10 DIAGNOSIS — Z992 Dependence on renal dialysis: Secondary | ICD-10-CM | POA: Diagnosis not present

## 2022-06-12 DIAGNOSIS — Z992 Dependence on renal dialysis: Secondary | ICD-10-CM | POA: Diagnosis not present

## 2022-06-12 DIAGNOSIS — D689 Coagulation defect, unspecified: Secondary | ICD-10-CM | POA: Diagnosis not present

## 2022-06-12 DIAGNOSIS — N2581 Secondary hyperparathyroidism of renal origin: Secondary | ICD-10-CM | POA: Diagnosis not present

## 2022-06-12 DIAGNOSIS — N186 End stage renal disease: Secondary | ICD-10-CM | POA: Diagnosis not present

## 2022-06-12 DIAGNOSIS — L299 Pruritus, unspecified: Secondary | ICD-10-CM | POA: Diagnosis not present

## 2022-06-12 DIAGNOSIS — D631 Anemia in chronic kidney disease: Secondary | ICD-10-CM | POA: Diagnosis not present

## 2022-06-14 DIAGNOSIS — D689 Coagulation defect, unspecified: Secondary | ICD-10-CM | POA: Diagnosis not present

## 2022-06-14 DIAGNOSIS — Z992 Dependence on renal dialysis: Secondary | ICD-10-CM | POA: Diagnosis not present

## 2022-06-14 DIAGNOSIS — D631 Anemia in chronic kidney disease: Secondary | ICD-10-CM | POA: Diagnosis not present

## 2022-06-14 DIAGNOSIS — N186 End stage renal disease: Secondary | ICD-10-CM | POA: Diagnosis not present

## 2022-06-14 DIAGNOSIS — L299 Pruritus, unspecified: Secondary | ICD-10-CM | POA: Diagnosis not present

## 2022-06-14 DIAGNOSIS — N2581 Secondary hyperparathyroidism of renal origin: Secondary | ICD-10-CM | POA: Diagnosis not present

## 2022-06-17 DIAGNOSIS — L299 Pruritus, unspecified: Secondary | ICD-10-CM | POA: Diagnosis not present

## 2022-06-17 DIAGNOSIS — D631 Anemia in chronic kidney disease: Secondary | ICD-10-CM | POA: Diagnosis not present

## 2022-06-17 DIAGNOSIS — Z992 Dependence on renal dialysis: Secondary | ICD-10-CM | POA: Diagnosis not present

## 2022-06-17 DIAGNOSIS — N186 End stage renal disease: Secondary | ICD-10-CM | POA: Diagnosis not present

## 2022-06-17 DIAGNOSIS — N2581 Secondary hyperparathyroidism of renal origin: Secondary | ICD-10-CM | POA: Diagnosis not present

## 2022-06-17 DIAGNOSIS — D689 Coagulation defect, unspecified: Secondary | ICD-10-CM | POA: Diagnosis not present

## 2022-06-19 DIAGNOSIS — N2581 Secondary hyperparathyroidism of renal origin: Secondary | ICD-10-CM | POA: Diagnosis not present

## 2022-06-19 DIAGNOSIS — D631 Anemia in chronic kidney disease: Secondary | ICD-10-CM | POA: Diagnosis not present

## 2022-06-19 DIAGNOSIS — L299 Pruritus, unspecified: Secondary | ICD-10-CM | POA: Diagnosis not present

## 2022-06-19 DIAGNOSIS — Z992 Dependence on renal dialysis: Secondary | ICD-10-CM | POA: Diagnosis not present

## 2022-06-19 DIAGNOSIS — D689 Coagulation defect, unspecified: Secondary | ICD-10-CM | POA: Diagnosis not present

## 2022-06-19 DIAGNOSIS — N186 End stage renal disease: Secondary | ICD-10-CM | POA: Diagnosis not present

## 2022-06-21 DIAGNOSIS — L299 Pruritus, unspecified: Secondary | ICD-10-CM | POA: Diagnosis not present

## 2022-06-21 DIAGNOSIS — N186 End stage renal disease: Secondary | ICD-10-CM | POA: Diagnosis not present

## 2022-06-21 DIAGNOSIS — Z992 Dependence on renal dialysis: Secondary | ICD-10-CM | POA: Diagnosis not present

## 2022-06-21 DIAGNOSIS — D631 Anemia in chronic kidney disease: Secondary | ICD-10-CM | POA: Diagnosis not present

## 2022-06-21 DIAGNOSIS — N2581 Secondary hyperparathyroidism of renal origin: Secondary | ICD-10-CM | POA: Diagnosis not present

## 2022-06-21 DIAGNOSIS — D689 Coagulation defect, unspecified: Secondary | ICD-10-CM | POA: Diagnosis not present

## 2022-06-24 DIAGNOSIS — D631 Anemia in chronic kidney disease: Secondary | ICD-10-CM | POA: Diagnosis not present

## 2022-06-24 DIAGNOSIS — N2581 Secondary hyperparathyroidism of renal origin: Secondary | ICD-10-CM | POA: Diagnosis not present

## 2022-06-24 DIAGNOSIS — L299 Pruritus, unspecified: Secondary | ICD-10-CM | POA: Diagnosis not present

## 2022-06-24 DIAGNOSIS — D689 Coagulation defect, unspecified: Secondary | ICD-10-CM | POA: Diagnosis not present

## 2022-06-24 DIAGNOSIS — Z992 Dependence on renal dialysis: Secondary | ICD-10-CM | POA: Diagnosis not present

## 2022-06-24 DIAGNOSIS — N186 End stage renal disease: Secondary | ICD-10-CM | POA: Diagnosis not present

## 2022-06-26 DIAGNOSIS — Z992 Dependence on renal dialysis: Secondary | ICD-10-CM | POA: Diagnosis not present

## 2022-06-26 DIAGNOSIS — D631 Anemia in chronic kidney disease: Secondary | ICD-10-CM | POA: Diagnosis not present

## 2022-06-26 DIAGNOSIS — N2581 Secondary hyperparathyroidism of renal origin: Secondary | ICD-10-CM | POA: Diagnosis not present

## 2022-06-26 DIAGNOSIS — L299 Pruritus, unspecified: Secondary | ICD-10-CM | POA: Diagnosis not present

## 2022-06-26 DIAGNOSIS — D689 Coagulation defect, unspecified: Secondary | ICD-10-CM | POA: Diagnosis not present

## 2022-06-26 DIAGNOSIS — N186 End stage renal disease: Secondary | ICD-10-CM | POA: Diagnosis not present

## 2022-06-28 DIAGNOSIS — L299 Pruritus, unspecified: Secondary | ICD-10-CM | POA: Diagnosis not present

## 2022-06-28 DIAGNOSIS — Z992 Dependence on renal dialysis: Secondary | ICD-10-CM | POA: Diagnosis not present

## 2022-06-28 DIAGNOSIS — D689 Coagulation defect, unspecified: Secondary | ICD-10-CM | POA: Diagnosis not present

## 2022-06-28 DIAGNOSIS — N2581 Secondary hyperparathyroidism of renal origin: Secondary | ICD-10-CM | POA: Diagnosis not present

## 2022-06-28 DIAGNOSIS — N186 End stage renal disease: Secondary | ICD-10-CM | POA: Diagnosis not present

## 2022-06-28 DIAGNOSIS — D631 Anemia in chronic kidney disease: Secondary | ICD-10-CM | POA: Diagnosis not present

## 2022-07-01 DIAGNOSIS — Z992 Dependence on renal dialysis: Secondary | ICD-10-CM | POA: Diagnosis not present

## 2022-07-01 DIAGNOSIS — N2581 Secondary hyperparathyroidism of renal origin: Secondary | ICD-10-CM | POA: Diagnosis not present

## 2022-07-01 DIAGNOSIS — D631 Anemia in chronic kidney disease: Secondary | ICD-10-CM | POA: Diagnosis not present

## 2022-07-01 DIAGNOSIS — L299 Pruritus, unspecified: Secondary | ICD-10-CM | POA: Diagnosis not present

## 2022-07-01 DIAGNOSIS — N186 End stage renal disease: Secondary | ICD-10-CM | POA: Diagnosis not present

## 2022-07-01 DIAGNOSIS — D689 Coagulation defect, unspecified: Secondary | ICD-10-CM | POA: Diagnosis not present

## 2022-07-03 DIAGNOSIS — N2581 Secondary hyperparathyroidism of renal origin: Secondary | ICD-10-CM | POA: Diagnosis not present

## 2022-07-03 DIAGNOSIS — D689 Coagulation defect, unspecified: Secondary | ICD-10-CM | POA: Diagnosis not present

## 2022-07-03 DIAGNOSIS — D631 Anemia in chronic kidney disease: Secondary | ICD-10-CM | POA: Diagnosis not present

## 2022-07-03 DIAGNOSIS — Z992 Dependence on renal dialysis: Secondary | ICD-10-CM | POA: Diagnosis not present

## 2022-07-03 DIAGNOSIS — N186 End stage renal disease: Secondary | ICD-10-CM | POA: Diagnosis not present

## 2022-07-03 DIAGNOSIS — L299 Pruritus, unspecified: Secondary | ICD-10-CM | POA: Diagnosis not present

## 2022-07-08 DIAGNOSIS — D689 Coagulation defect, unspecified: Secondary | ICD-10-CM | POA: Diagnosis not present

## 2022-07-08 DIAGNOSIS — N186 End stage renal disease: Secondary | ICD-10-CM | POA: Diagnosis not present

## 2022-07-08 DIAGNOSIS — Z992 Dependence on renal dialysis: Secondary | ICD-10-CM | POA: Diagnosis not present

## 2022-07-08 DIAGNOSIS — N2581 Secondary hyperparathyroidism of renal origin: Secondary | ICD-10-CM | POA: Diagnosis not present

## 2022-07-08 DIAGNOSIS — L299 Pruritus, unspecified: Secondary | ICD-10-CM | POA: Diagnosis not present

## 2022-07-08 DIAGNOSIS — D631 Anemia in chronic kidney disease: Secondary | ICD-10-CM | POA: Diagnosis not present

## 2022-07-10 DIAGNOSIS — L299 Pruritus, unspecified: Secondary | ICD-10-CM | POA: Diagnosis not present

## 2022-07-10 DIAGNOSIS — Z992 Dependence on renal dialysis: Secondary | ICD-10-CM | POA: Diagnosis not present

## 2022-07-10 DIAGNOSIS — D689 Coagulation defect, unspecified: Secondary | ICD-10-CM | POA: Diagnosis not present

## 2022-07-10 DIAGNOSIS — N2581 Secondary hyperparathyroidism of renal origin: Secondary | ICD-10-CM | POA: Diagnosis not present

## 2022-07-10 DIAGNOSIS — D631 Anemia in chronic kidney disease: Secondary | ICD-10-CM | POA: Diagnosis not present

## 2022-07-10 DIAGNOSIS — N186 End stage renal disease: Secondary | ICD-10-CM | POA: Diagnosis not present

## 2022-07-11 ENCOUNTER — Emergency Department (HOSPITAL_BASED_OUTPATIENT_CLINIC_OR_DEPARTMENT_OTHER)
Admission: EM | Admit: 2022-07-11 | Discharge: 2022-07-11 | Disposition: A | Discharge status: Home / Self Care | Payer: 59 | Attending: Emergency Medicine | Admitting: Emergency Medicine

## 2022-07-11 ENCOUNTER — Encounter (HOSPITAL_BASED_OUTPATIENT_CLINIC_OR_DEPARTMENT_OTHER): Payer: Self-pay

## 2022-07-11 ENCOUNTER — Other Ambulatory Visit: Payer: Self-pay

## 2022-07-11 DIAGNOSIS — Z794 Long term (current) use of insulin: Secondary | ICD-10-CM | POA: Insufficient documentation

## 2022-07-11 DIAGNOSIS — S86111A Strain of other muscle(s) and tendon(s) of posterior muscle group at lower leg level, right leg, initial encounter: Secondary | ICD-10-CM | POA: Diagnosis not present

## 2022-07-11 DIAGNOSIS — M79604 Pain in right leg: Secondary | ICD-10-CM | POA: Diagnosis present

## 2022-07-11 DIAGNOSIS — X58XXXA Exposure to other specified factors, initial encounter: Secondary | ICD-10-CM | POA: Insufficient documentation

## 2022-07-11 MED ORDER — OXYCODONE HCL 5 MG PO TABS: 5.0000 mg | ORAL_TABLET | ORAL | 0 refills | Status: DC | PRN

## 2022-07-11 MED ORDER — OXYCODONE HCL 5 MG PO TABS
10.0000 mg | ORAL_TABLET | Freq: Once | ORAL | Status: AC
  Administered 2022-07-11: 10 mg via ORAL
  Filled 2022-07-11: qty 2

## 2022-07-11 NOTE — Discharge Instructions (Addendum)

## 2022-07-11 NOTE — ED Triage Notes (Signed)
Patient here POV from Home.  Endorses pushing his truck 2 Hours ago when he felt a Tear in his Posterior Lower Right Leg. No Knee/Foot/Ankle Pain.   NAD Noted during Triage. A&Ox4. GCS 15. BIB Wheelchair.

## 2022-07-11 NOTE — ED Provider Notes (Signed)
[facility] Provider Note   CSN: [id number] Arrival date & time: 07/11/22  1728     History  Chief Complaint  Patient presents with   Leg Pain    Matthew Odom is a 52 y.o. male with overall noncontributory past medical history who presents with concern for right calf pain acutely after attempting to push his car after it ran out of gas earlier today.  Patient reports that he has had a previous Achilles tear, and that it feels similar but more in his calf.  He reports that he took it as milligrams Tylenol before arrival with no significant improvement.   Leg Pain      Home Medications Prior to Admission medications   Medication Sig Start Date End Date Taking? Authorizing Provider  oxyCODONE (ROXICODONE) 5 MG immediate release tablet Take 1 tablet (5 mg total) by mouth every 4 (four) hours as needed for severe pain. 07/11/22  Yes Mercedez Boule H, PA-C  acetaminophen (TYLENOL) 500 MG tablet Take 500-1,000 mg by mouth every 6 (six) hours as needed (pain.).    [provider]  albuterol (PROVENTIL) (2.5 MG/3ML) 0.083% nebulizer solution Take 3 mLs (2.5 mg total) by nebulization every 6 (six) hours as needed for wheezing or shortness of breath. 05/12/22   Spero Geralds, MD  amLODipine (NORVASC) 10 MG tablet Take 1 tablet (10 mg total) by mouth daily. 05/12/22   O'NealCassie Freer, MD  atorvastatin (LIPITOR) 80 MG tablet Take 1 tablet (80 mg total) by mouth daily. 11/13/21 03/03/22  Hosie Poisson, MD  budesonide-formoterol (SYMBICORT) 160-4.5 MCG/ACT inhaler Inhale 2 puffs into the lungs daily. 05/14/22   Spero Geralds, MD  calcium acetate (PHOSLO) 667 MG capsule Take 2 capsules (1,334 mg total) by mouth 3 (three) times daily with meals 06/25/21     Calcium Carbonate Antacid (TUMS PO) Take 2 tablets by mouth 3 (three) times daily with meals.    [provider]  carvedilol (COREG) 25 MG tablet Take 1 tablet (25 mg total) by  mouth 2 (two) times daily with a meal. 05/12/22   O'Neal, Cassie Freer, MD  doxazosin (CARDURA) 8 MG tablet Take 1 tablet (8 mg total) by mouth daily. 05/12/22   O'NealCassie Freer, MD  hydrALAZINE (APRESOLINE) 100 MG tablet Take 1 tablet (100 mg total) by mouth 3 (three) times daily. 05/12/22   O'NealCassie Freer, MD  Insulin Glargine [place]) 100 UNIT/ML Inject 20 Units into the skin daily. Patient taking differently: Inject 10 Units into the skin 2 (two) times daily. 06/19/21   Shamleffer, Melanie Crazier, MD  Insulin Pen Needle 31G X 6 MM MISC Use as directed in the morning, at noon, in the evening, and at bedtime. 06/19/21   Shamleffer, Melanie Crazier, MD  liraglutide (VICTOZA) 18 MG/3ML SOPN Inject 1.8 mg into the skin daily. 06/19/21   Shamleffer, Melanie Crazier, MD  Misc. Devices MISC Please provide patient with insurance approved CPAP with the following settings:  autopap 15-20.  Large size [employer] Full Face Mask Forma mask and heated humidification. Patient taking differently: Please provide patient with insurance approved CPAP with the following settings:  autopap 15-20.  Large size [employer] Full Face Mask Forma mask and heated humidification. 12/25/19   [name], NP  nicotine (NICODERM CQ - DOSED IN MG/24 HOURS) 14 mg/24hr patch Place 1 patch (14 mg total) onto the skin daily as needed (tobacco dependence). 11/22/21   Lendon Colonel, NP  nitroGLYCERIN (NITROSTAT) 0.4 MG SL tablet Place 1 tablet (0.4 mg total) under the tongue every 5 (five) minutes as needed for up to 30 doses for chest pain. 07/16/21   Wyvonnia Dusky, MD  sertraline (ZOLOFT) 25 MG tablet Take 1 tablet (25 mg total) by mouth daily. 05/21/21 11/12/21  Camillia Herter, NP  sevelamer carbonate (RENVELA) 800 MG tablet Take 2 tablet by mouth three times a day with meals For high phosphorus. 03/18/22         Allergies    Aspirin and Nitroglycerin    Review of Systems   Review of Systems   Musculoskeletal:  Positive for myalgias.  All other systems reviewed and are negative.   Physical Exam Updated Vital Signs BP (!) 171/107 (BP Location: Right Arm)   Pulse 95   Temp 98.6 F (37 C)   Resp 16   Ht '5\' 11"'$  (1.803 m)   Wt 106.6 kg   SpO2 95%   BMI 32.78 kg/m  Physical Exam Vitals and nursing note reviewed.  Constitutional:      General: He is not in acute distress.    Appearance: Normal appearance.  HENT:     Head: Normocephalic and atraumatic.  Eyes:     General:        Right eye: No discharge.        Left eye: No discharge.  Cardiovascular:     Rate and Rhythm: Normal rate and regular rhythm.     Pulses: Normal pulses.  Pulmonary:     Effort: Pulmonary effort is normal. No respiratory distress.  Musculoskeletal:        General: No deformity.     Comments: Patient with tenderness to palpation of the right gastroc without obvious step-off.  He has some significant pain with dorsiflexion, plantarflexion.  His Achilles tendon is intact, normal Thompson.  He is unable to bear weight but can plantarflex, dorsiflex through normal range of motion with some pain.  Skin:    General: Skin is warm and dry.     Capillary Refill: Capillary refill takes less than 2 seconds.  Neurological:     Mental Status: He is alert and oriented to person, place, and time.  Psychiatric:        Mood and Affect: Mood normal.        Behavior: Behavior normal.     ED Results / Procedures / Treatments   Labs (all labs ordered are listed, but only abnormal results are displayed) Labs Reviewed - No data to display  EKG None  Radiology No results found.  Procedures Procedures    Medications Ordered in ED Medications  oxyCODONE (Oxy IR/ROXICODONE) immediate release tablet 10 mg (10 mg Oral Given 07/11/22 1830)    ED Course/ Medical Decision Making/ A&P                             Medical Decision Making  This patient is a 52 y.o. male who presents to the ED for concern  of right sided calf pain from traumatic pushing injury.   Differential diagnoses prior to evaluation: Calf strain, calf tear, Achilles strain, Achilles tear, other musculoskeletal injury, low clinical suspicion for acute fracture, dislocation of the tibia or fibula, given known traumatic injury recently I have low clinical suspicion of heart failure exacerbation, cellulitis  Past Medical History / Social History / Additional history: Chart reviewed. Pertinent results include: Previous Achilles tear on the contralateral  side, no other contributory medical history  Physical Exam: Physical exam performed. The pertinent findings include: Patient with significant tenderness of the right calf with decreased strength of dorsiflexion, plantarflexion secondary to pain but without any evidence of deformity of the Achilles, or deformity of the calf muscle itself.  Intact DP, PT pulses and distal capillary refill.  Medications / Treatment: Administered Percocet in the emergency department, will place in cam boot in dorsiflexion for possible partial calf tear   Disposition: After consideration of the diagnostic results and the patients response to treatment, I feel that patient symptoms consistent with calf strain versus calf tear, we will place him in a cam walker boot, administered crutches, discussed pain control with ibuprofen, Tylenol, muscle relaxant, and close orthopedic follow-up for further evaluation and possible MRI.   emergency department workup does not suggest an emergent condition requiring admission or immediate intervention beyond what has been performed at this time. The plan is: as above. The patient is safe for discharge and has been instructed to return immediately for worsening symptoms, change in symptoms or any other concerns.  Final Clinical Impression(s) / ED Diagnoses Final diagnoses:  Rupture of right gastrocnemius tendon, initial encounter    Rx / DC Orders ED Discharge  Orders          Ordered    oxyCODONE (ROXICODONE) 5 MG immediate release tablet  Every 4 hours PRN        07/11/22 1824              Anselmo Pickler, PA-C 07/11/22 1849    Fredia Sorrow, MD 07/11/22 2329

## 2022-07-12 DIAGNOSIS — N2581 Secondary hyperparathyroidism of renal origin: Secondary | ICD-10-CM | POA: Diagnosis not present

## 2022-07-12 DIAGNOSIS — Z992 Dependence on renal dialysis: Secondary | ICD-10-CM | POA: Diagnosis not present

## 2022-07-12 DIAGNOSIS — D689 Coagulation defect, unspecified: Secondary | ICD-10-CM | POA: Diagnosis not present

## 2022-07-12 DIAGNOSIS — D631 Anemia in chronic kidney disease: Secondary | ICD-10-CM | POA: Diagnosis not present

## 2022-07-12 DIAGNOSIS — L299 Pruritus, unspecified: Secondary | ICD-10-CM | POA: Diagnosis not present

## 2022-07-12 DIAGNOSIS — N186 End stage renal disease: Secondary | ICD-10-CM | POA: Diagnosis not present

## 2022-07-15 DIAGNOSIS — D689 Coagulation defect, unspecified: Secondary | ICD-10-CM | POA: Diagnosis not present

## 2022-07-15 DIAGNOSIS — N2581 Secondary hyperparathyroidism of renal origin: Secondary | ICD-10-CM | POA: Diagnosis not present

## 2022-07-15 DIAGNOSIS — L299 Pruritus, unspecified: Secondary | ICD-10-CM | POA: Diagnosis not present

## 2022-07-15 DIAGNOSIS — N186 End stage renal disease: Secondary | ICD-10-CM | POA: Diagnosis not present

## 2022-07-15 DIAGNOSIS — Z992 Dependence on renal dialysis: Secondary | ICD-10-CM | POA: Diagnosis not present

## 2022-07-15 DIAGNOSIS — D631 Anemia in chronic kidney disease: Secondary | ICD-10-CM | POA: Diagnosis not present

## 2022-07-16 ENCOUNTER — Telehealth: Payer: Self-pay

## 2022-07-16 NOTE — Telephone Encounter (Signed)
        Patient  visited [facility] on 3/8    Telephone encounter attempt :  1st  A HIPAA compliant voice message was left requesting a return call.  Instructed patient to call back .    [facility] [phone] [address], [place], [place] [address] Phone: [phone] Email: [email or web address]

## 2022-07-17 DIAGNOSIS — D689 Coagulation defect, unspecified: Secondary | ICD-10-CM | POA: Diagnosis not present

## 2022-07-17 DIAGNOSIS — L299 Pruritus, unspecified: Secondary | ICD-10-CM | POA: Diagnosis not present

## 2022-07-17 DIAGNOSIS — N2581 Secondary hyperparathyroidism of renal origin: Secondary | ICD-10-CM | POA: Diagnosis not present

## 2022-07-17 DIAGNOSIS — D631 Anemia in chronic kidney disease: Secondary | ICD-10-CM | POA: Diagnosis not present

## 2022-07-17 DIAGNOSIS — Z992 Dependence on renal dialysis: Secondary | ICD-10-CM | POA: Diagnosis not present

## 2022-07-17 DIAGNOSIS — N186 End stage renal disease: Secondary | ICD-10-CM | POA: Diagnosis not present

## 2022-07-19 DIAGNOSIS — D631 Anemia in chronic kidney disease: Secondary | ICD-10-CM | POA: Diagnosis not present

## 2022-07-19 DIAGNOSIS — N186 End stage renal disease: Secondary | ICD-10-CM | POA: Diagnosis not present

## 2022-07-19 DIAGNOSIS — L299 Pruritus, unspecified: Secondary | ICD-10-CM | POA: Diagnosis not present

## 2022-07-19 DIAGNOSIS — D689 Coagulation defect, unspecified: Secondary | ICD-10-CM | POA: Diagnosis not present

## 2022-07-19 DIAGNOSIS — Z992 Dependence on renal dialysis: Secondary | ICD-10-CM | POA: Diagnosis not present

## 2022-07-19 DIAGNOSIS — N2581 Secondary hyperparathyroidism of renal origin: Secondary | ICD-10-CM | POA: Diagnosis not present

## 2022-07-21 ENCOUNTER — Telehealth: Payer: Self-pay

## 2022-07-21 NOTE — Telephone Encounter (Signed)
     Patient  visit on 3/8  at [facility]    Have you been able to follow up with your primary care physician?No   The patient was or was not able to obtain any needed medicine or equipment. Yes   Are there diet recommendations that you are having difficulty following? Na   Patient expresses understanding of discharge instructions and education provided has no other needs at this time.  Yes      [facility] [phone] [address], [place], [place] [address] Phone: [phone] Email: [email or web address]

## 2022-07-22 DIAGNOSIS — N186 End stage renal disease: Secondary | ICD-10-CM | POA: Diagnosis not present

## 2022-07-22 DIAGNOSIS — D631 Anemia in chronic kidney disease: Secondary | ICD-10-CM | POA: Diagnosis not present

## 2022-07-22 DIAGNOSIS — N2581 Secondary hyperparathyroidism of renal origin: Secondary | ICD-10-CM | POA: Diagnosis not present

## 2022-07-22 DIAGNOSIS — Z992 Dependence on renal dialysis: Secondary | ICD-10-CM | POA: Diagnosis not present

## 2022-07-22 DIAGNOSIS — D689 Coagulation defect, unspecified: Secondary | ICD-10-CM | POA: Diagnosis not present

## 2022-07-22 DIAGNOSIS — L299 Pruritus, unspecified: Secondary | ICD-10-CM | POA: Diagnosis not present

## 2022-07-24 DIAGNOSIS — N2581 Secondary hyperparathyroidism of renal origin: Secondary | ICD-10-CM | POA: Diagnosis not present

## 2022-07-24 DIAGNOSIS — L299 Pruritus, unspecified: Secondary | ICD-10-CM | POA: Diagnosis not present

## 2022-07-24 DIAGNOSIS — D631 Anemia in chronic kidney disease: Secondary | ICD-10-CM | POA: Diagnosis not present

## 2022-07-24 DIAGNOSIS — Z992 Dependence on renal dialysis: Secondary | ICD-10-CM | POA: Diagnosis not present

## 2022-07-24 DIAGNOSIS — N186 End stage renal disease: Secondary | ICD-10-CM | POA: Diagnosis not present

## 2022-07-24 DIAGNOSIS — D689 Coagulation defect, unspecified: Secondary | ICD-10-CM | POA: Diagnosis not present

## 2022-07-26 DIAGNOSIS — D631 Anemia in chronic kidney disease: Secondary | ICD-10-CM | POA: Diagnosis not present

## 2022-07-26 DIAGNOSIS — N186 End stage renal disease: Secondary | ICD-10-CM | POA: Diagnosis not present

## 2022-07-26 DIAGNOSIS — N2581 Secondary hyperparathyroidism of renal origin: Secondary | ICD-10-CM | POA: Diagnosis not present

## 2022-07-26 DIAGNOSIS — Z992 Dependence on renal dialysis: Secondary | ICD-10-CM | POA: Diagnosis not present

## 2022-07-26 DIAGNOSIS — D689 Coagulation defect, unspecified: Secondary | ICD-10-CM | POA: Diagnosis not present

## 2022-07-26 DIAGNOSIS — L299 Pruritus, unspecified: Secondary | ICD-10-CM | POA: Diagnosis not present

## 2022-07-29 DIAGNOSIS — D631 Anemia in chronic kidney disease: Secondary | ICD-10-CM | POA: Diagnosis not present

## 2022-07-29 DIAGNOSIS — N2581 Secondary hyperparathyroidism of renal origin: Secondary | ICD-10-CM | POA: Diagnosis not present

## 2022-07-29 DIAGNOSIS — Z992 Dependence on renal dialysis: Secondary | ICD-10-CM | POA: Diagnosis not present

## 2022-07-29 DIAGNOSIS — L299 Pruritus, unspecified: Secondary | ICD-10-CM | POA: Diagnosis not present

## 2022-07-29 DIAGNOSIS — D689 Coagulation defect, unspecified: Secondary | ICD-10-CM | POA: Diagnosis not present

## 2022-07-29 DIAGNOSIS — N186 End stage renal disease: Secondary | ICD-10-CM | POA: Diagnosis not present

## 2022-07-31 DIAGNOSIS — D631 Anemia in chronic kidney disease: Secondary | ICD-10-CM | POA: Diagnosis not present

## 2022-07-31 DIAGNOSIS — N2581 Secondary hyperparathyroidism of renal origin: Secondary | ICD-10-CM | POA: Diagnosis not present

## 2022-07-31 DIAGNOSIS — D689 Coagulation defect, unspecified: Secondary | ICD-10-CM | POA: Diagnosis not present

## 2022-07-31 DIAGNOSIS — N186 End stage renal disease: Secondary | ICD-10-CM | POA: Diagnosis not present

## 2022-07-31 DIAGNOSIS — L299 Pruritus, unspecified: Secondary | ICD-10-CM | POA: Diagnosis not present

## 2022-07-31 DIAGNOSIS — Z992 Dependence on renal dialysis: Secondary | ICD-10-CM | POA: Diagnosis not present

## 2022-08-02 DIAGNOSIS — N186 End stage renal disease: Secondary | ICD-10-CM | POA: Diagnosis not present

## 2022-08-02 DIAGNOSIS — N2581 Secondary hyperparathyroidism of renal origin: Secondary | ICD-10-CM | POA: Diagnosis not present

## 2022-08-02 DIAGNOSIS — Z992 Dependence on renal dialysis: Secondary | ICD-10-CM | POA: Diagnosis not present

## 2022-08-02 DIAGNOSIS — D631 Anemia in chronic kidney disease: Secondary | ICD-10-CM | POA: Diagnosis not present

## 2022-08-02 DIAGNOSIS — D689 Coagulation defect, unspecified: Secondary | ICD-10-CM | POA: Diagnosis not present

## 2022-08-02 DIAGNOSIS — L299 Pruritus, unspecified: Secondary | ICD-10-CM | POA: Diagnosis not present

## 2022-08-03 DIAGNOSIS — E1129 Type 2 diabetes mellitus with other diabetic kidney complication: Secondary | ICD-10-CM | POA: Diagnosis not present

## 2022-08-03 DIAGNOSIS — Z992 Dependence on renal dialysis: Secondary | ICD-10-CM | POA: Diagnosis not present

## 2022-08-03 DIAGNOSIS — N186 End stage renal disease: Secondary | ICD-10-CM | POA: Diagnosis not present

## 2022-08-05 DIAGNOSIS — N186 End stage renal disease: Secondary | ICD-10-CM | POA: Diagnosis not present

## 2022-08-05 DIAGNOSIS — L299 Pruritus, unspecified: Secondary | ICD-10-CM | POA: Diagnosis not present

## 2022-08-05 DIAGNOSIS — E1122 Type 2 diabetes mellitus with diabetic chronic kidney disease: Secondary | ICD-10-CM | POA: Diagnosis not present

## 2022-08-05 DIAGNOSIS — D689 Coagulation defect, unspecified: Secondary | ICD-10-CM | POA: Diagnosis not present

## 2022-08-05 DIAGNOSIS — D631 Anemia in chronic kidney disease: Secondary | ICD-10-CM | POA: Diagnosis not present

## 2022-08-05 DIAGNOSIS — Z992 Dependence on renal dialysis: Secondary | ICD-10-CM | POA: Diagnosis not present

## 2022-08-05 DIAGNOSIS — N2581 Secondary hyperparathyroidism of renal origin: Secondary | ICD-10-CM | POA: Diagnosis not present

## 2022-08-07 DIAGNOSIS — Z992 Dependence on renal dialysis: Secondary | ICD-10-CM | POA: Diagnosis not present

## 2022-08-07 DIAGNOSIS — D631 Anemia in chronic kidney disease: Secondary | ICD-10-CM | POA: Diagnosis not present

## 2022-08-07 DIAGNOSIS — N2581 Secondary hyperparathyroidism of renal origin: Secondary | ICD-10-CM | POA: Diagnosis not present

## 2022-08-07 DIAGNOSIS — D689 Coagulation defect, unspecified: Secondary | ICD-10-CM | POA: Diagnosis not present

## 2022-08-07 DIAGNOSIS — L299 Pruritus, unspecified: Secondary | ICD-10-CM | POA: Diagnosis not present

## 2022-08-07 DIAGNOSIS — N186 End stage renal disease: Secondary | ICD-10-CM | POA: Diagnosis not present

## 2022-08-07 DIAGNOSIS — E1122 Type 2 diabetes mellitus with diabetic chronic kidney disease: Secondary | ICD-10-CM | POA: Diagnosis not present

## 2022-08-09 DIAGNOSIS — Z992 Dependence on renal dialysis: Secondary | ICD-10-CM | POA: Diagnosis not present

## 2022-08-09 DIAGNOSIS — D689 Coagulation defect, unspecified: Secondary | ICD-10-CM | POA: Diagnosis not present

## 2022-08-09 DIAGNOSIS — E1122 Type 2 diabetes mellitus with diabetic chronic kidney disease: Secondary | ICD-10-CM | POA: Diagnosis not present

## 2022-08-09 DIAGNOSIS — D631 Anemia in chronic kidney disease: Secondary | ICD-10-CM | POA: Diagnosis not present

## 2022-08-09 DIAGNOSIS — L299 Pruritus, unspecified: Secondary | ICD-10-CM | POA: Diagnosis not present

## 2022-08-09 DIAGNOSIS — N2581 Secondary hyperparathyroidism of renal origin: Secondary | ICD-10-CM | POA: Diagnosis not present

## 2022-08-09 DIAGNOSIS — N186 End stage renal disease: Secondary | ICD-10-CM | POA: Diagnosis not present

## 2022-08-12 DIAGNOSIS — N186 End stage renal disease: Secondary | ICD-10-CM | POA: Diagnosis not present

## 2022-08-12 DIAGNOSIS — Z992 Dependence on renal dialysis: Secondary | ICD-10-CM | POA: Diagnosis not present

## 2022-08-12 DIAGNOSIS — D689 Coagulation defect, unspecified: Secondary | ICD-10-CM | POA: Diagnosis not present

## 2022-08-12 DIAGNOSIS — L299 Pruritus, unspecified: Secondary | ICD-10-CM | POA: Diagnosis not present

## 2022-08-12 DIAGNOSIS — E1122 Type 2 diabetes mellitus with diabetic chronic kidney disease: Secondary | ICD-10-CM | POA: Diagnosis not present

## 2022-08-12 DIAGNOSIS — N2581 Secondary hyperparathyroidism of renal origin: Secondary | ICD-10-CM | POA: Diagnosis not present

## 2022-08-12 DIAGNOSIS — D631 Anemia in chronic kidney disease: Secondary | ICD-10-CM | POA: Diagnosis not present

## 2022-08-14 ENCOUNTER — Other Ambulatory Visit: Payer: Self-pay

## 2022-08-14 DIAGNOSIS — E1122 Type 2 diabetes mellitus with diabetic chronic kidney disease: Secondary | ICD-10-CM | POA: Diagnosis not present

## 2022-08-14 DIAGNOSIS — N2581 Secondary hyperparathyroidism of renal origin: Secondary | ICD-10-CM | POA: Diagnosis not present

## 2022-08-14 DIAGNOSIS — Z992 Dependence on renal dialysis: Secondary | ICD-10-CM | POA: Diagnosis not present

## 2022-08-14 DIAGNOSIS — L299 Pruritus, unspecified: Secondary | ICD-10-CM | POA: Diagnosis not present

## 2022-08-14 DIAGNOSIS — D689 Coagulation defect, unspecified: Secondary | ICD-10-CM | POA: Diagnosis not present

## 2022-08-14 DIAGNOSIS — D631 Anemia in chronic kidney disease: Secondary | ICD-10-CM | POA: Diagnosis not present

## 2022-08-14 DIAGNOSIS — N186 End stage renal disease: Secondary | ICD-10-CM | POA: Diagnosis not present

## 2022-08-14 MED ORDER — DOXAZOSIN MESYLATE 4 MG PO TABS
12.0000 mg | ORAL_TABLET | Freq: Every day | ORAL | 3 refills | Status: DC
  Filled 2022-08-14: qty 90, 30d supply, fill #0

## 2022-08-16 DIAGNOSIS — Z992 Dependence on renal dialysis: Secondary | ICD-10-CM | POA: Diagnosis not present

## 2022-08-16 DIAGNOSIS — E1122 Type 2 diabetes mellitus with diabetic chronic kidney disease: Secondary | ICD-10-CM | POA: Diagnosis not present

## 2022-08-16 DIAGNOSIS — N186 End stage renal disease: Secondary | ICD-10-CM | POA: Diagnosis not present

## 2022-08-16 DIAGNOSIS — D631 Anemia in chronic kidney disease: Secondary | ICD-10-CM | POA: Diagnosis not present

## 2022-08-16 DIAGNOSIS — N2581 Secondary hyperparathyroidism of renal origin: Secondary | ICD-10-CM | POA: Diagnosis not present

## 2022-08-16 DIAGNOSIS — L299 Pruritus, unspecified: Secondary | ICD-10-CM | POA: Diagnosis not present

## 2022-08-16 DIAGNOSIS — D689 Coagulation defect, unspecified: Secondary | ICD-10-CM | POA: Diagnosis not present

## 2022-08-19 DIAGNOSIS — N2581 Secondary hyperparathyroidism of renal origin: Secondary | ICD-10-CM | POA: Diagnosis not present

## 2022-08-19 DIAGNOSIS — N186 End stage renal disease: Secondary | ICD-10-CM | POA: Diagnosis not present

## 2022-08-19 DIAGNOSIS — D689 Coagulation defect, unspecified: Secondary | ICD-10-CM | POA: Diagnosis not present

## 2022-08-19 DIAGNOSIS — Z992 Dependence on renal dialysis: Secondary | ICD-10-CM | POA: Diagnosis not present

## 2022-08-19 DIAGNOSIS — L299 Pruritus, unspecified: Secondary | ICD-10-CM | POA: Diagnosis not present

## 2022-08-19 DIAGNOSIS — D631 Anemia in chronic kidney disease: Secondary | ICD-10-CM | POA: Diagnosis not present

## 2022-08-19 DIAGNOSIS — E1122 Type 2 diabetes mellitus with diabetic chronic kidney disease: Secondary | ICD-10-CM | POA: Diagnosis not present

## 2022-08-20 ENCOUNTER — Emergency Department (HOSPITAL_BASED_OUTPATIENT_CLINIC_OR_DEPARTMENT_OTHER): Payer: 59

## 2022-08-20 ENCOUNTER — Emergency Department (HOSPITAL_BASED_OUTPATIENT_CLINIC_OR_DEPARTMENT_OTHER)
Admission: EM | Admit: 2022-08-20 | Discharge: 2022-08-21 | Disposition: A | Discharge status: Home / Self Care | Payer: 59 | Attending: Emergency Medicine | Admitting: Emergency Medicine

## 2022-08-20 ENCOUNTER — Other Ambulatory Visit: Payer: Self-pay

## 2022-08-20 DIAGNOSIS — S99922A Unspecified injury of left foot, initial encounter: Secondary | ICD-10-CM

## 2022-08-20 DIAGNOSIS — S8982XA Other specified injuries of left lower leg, initial encounter: Secondary | ICD-10-CM | POA: Diagnosis not present

## 2022-08-20 DIAGNOSIS — M79672 Pain in left foot: Secondary | ICD-10-CM | POA: Diagnosis not present

## 2022-08-20 DIAGNOSIS — Z794 Long term (current) use of insulin: Secondary | ICD-10-CM | POA: Diagnosis not present

## 2022-08-20 DIAGNOSIS — R600 Localized edema: Secondary | ICD-10-CM | POA: Diagnosis not present

## 2022-08-20 MED ORDER — OXYCODONE-ACETAMINOPHEN 5-325 MG PO TABS
1.0000 | ORAL_TABLET | Freq: Once | ORAL | Status: AC
  Administered 2022-08-20: 1 via ORAL
  Filled 2022-08-20: qty 1

## 2022-08-20 MED ORDER — KETOROLAC TROMETHAMINE 10 MG PO TABS: 10.0000 mg | ORAL_TABLET | Freq: Four times a day (QID) | ORAL | 0 refills | Status: DC | PRN

## 2022-08-20 NOTE — Telephone Encounter (Signed)
c 

## 2022-08-20 NOTE — ED Provider Notes (Signed)
[facility] EMERGENCY DEPARTMENT AT [facility] Provider Note   CSN: [id number] Arrival date & time: 08/20/22  2229     History  Chief Complaint  Patient presents with   Foot Injury    Matthew Odom is a 52 y.o. male, pertinent past medical history, who presents to the ED secondary to left foot pain after having a 2 ton trailer dropped on his foot.  He states he was helping someone out of town, when he was unloading a [employer], the trailer dropped on his foot.  He states that he was afraid that it was going to swell up, so he kept shoe on, and drove all the way back to [place].  This all occurred around 5 PM, and he states that his foot is swollen since then.    Home Medications Prior to Admission medications   Medication Sig Start Date End Date Taking? Authorizing Provider  ketorolac (TORADOL) 10 MG tablet Take 1 tablet (10 mg total) by mouth every 6 (six) hours as needed. 08/20/22  Yes Shanena Pellegrino L, PA  acetaminophen (TYLENOL) 500 MG tablet Take 500-1,000 mg by mouth every 6 (six) hours as needed (pain.).    [provider]  albuterol (PROVENTIL) (2.5 MG/3ML) 0.083% nebulizer solution Take 3 mLs (2.5 mg total) by nebulization every 6 (six) hours as needed for wheezing or shortness of breath. 05/12/22   Charlott Holler, MD  amLODipine (NORVASC) 10 MG tablet Take 1 tablet (10 mg total) by mouth daily. 05/12/22   O'NealRonnald Ramp, MD  atorvastatin (LIPITOR) 80 MG tablet Take 1 tablet (80 mg total) by mouth daily. 11/13/21 03/03/22  Kathlen Mody, MD  budesonide-formoterol (SYMBICORT) 160-4.5 MCG/ACT inhaler Inhale 2 puffs into the lungs daily. 05/14/22   Charlott Holler, MD  calcium acetate (PHOSLO) 667 MG capsule Take 2 capsules (1,334 mg total) by mouth 3 (three) times daily with meals 06/25/21     Calcium Carbonate Antacid (TUMS PO) Take 2 tablets by mouth 3 (three) times daily with meals.    [provider]  carvedilol (COREG) 25 MG tablet Take 1  tablet (25 mg total) by mouth 2 (two) times daily with a meal. 05/12/22   O'Neal, Ronnald Ramp, MD  doxazosin (CARDURA) 4 MG tablet Take 3 tablets (12 mg total) by mouth at bedtime. 08/14/22     doxazosin (CARDURA) 8 MG tablet Take 1 tablet (8 mg total) by mouth daily. 05/12/22   O'NealRonnald Ramp, MD  hydrALAZINE (APRESOLINE) 100 MG tablet Take 1 tablet (100 mg total) by mouth 3 (three) times daily. 05/12/22   O'NealRonnald Ramp, MD  Insulin Glargine [facility]) 100 UNIT/ML Inject 20 Units into the skin daily. Patient taking differently: Inject 10 Units into the skin 2 (two) times daily. 06/19/21   Shamleffer, Konrad Dolores, MD  Insulin Pen Needle 31G X 6 MM MISC Use as directed in the morning, at noon, in the evening, and at bedtime. 06/19/21   Shamleffer, Konrad Dolores, MD  liraglutide (VICTOZA) 18 MG/3ML SOPN Inject 1.8 mg into the skin daily. 06/19/21   Shamleffer, Konrad Dolores, MD  Misc. Devices MISC Please provide patient with insurance approved CPAP with the following settings:  autopap 15-20.  Large size [employer] Full Face Mask Forma mask and heated humidification. Patient taking differently: Please provide patient with insurance approved CPAP with the following settings:  autopap 15-20.  Large size [employer] Full Face Mask Forma mask and heated humidification. 12/25/19   Claiborne Rigg,  NP  nicotine (NICODERM CQ - DOSED IN MG/24 HOURS) 14 mg/24hr patch Place 1 patch (14 mg total) onto the skin daily as needed (tobacco dependence). 11/22/21   Jodelle Gross, NP  nitroGLYCERIN (NITROSTAT) 0.4 MG SL tablet Place 1 tablet (0.4 mg total) under the tongue every 5 (five) minutes as needed for up to 30 doses for chest pain. 07/16/21   Terald Sleeper, MD  oxyCODONE (ROXICODONE) 5 MG immediate release tablet Take 1 tablet (5 mg total) by mouth every 4 (four) hours as needed for severe pain. 07/11/22   Prosperi, Christian H, PA-C  sertraline (ZOLOFT) 25 MG tablet Take 1 tablet  (25 mg total) by mouth daily. 05/21/21 11/12/21  Rema Fendt, NP  sevelamer carbonate (RENVELA) 800 MG tablet Take 2 tablet by mouth three times a day with meals For high phosphorus. 03/18/22         Allergies    Aspirin and Nitroglycerin    Review of Systems   Review of Systems  Musculoskeletal:  Positive for joint swelling.  Skin:  Negative for wound.    Physical Exam Updated Vital Signs BP (!) 191/99 (BP Location: Right Arm)   Pulse 81   Temp 98.1 F (36.7 C) (Oral)   Resp 18   Ht  (1.803 m)   Wt 115.7 kg   SpO2 98%   BMI 35.57 kg/m  Physical Exam Vitals and nursing note reviewed.  Constitutional:      General: He is not in acute distress.    Appearance: He is well-developed.  HENT:     Head: Normocephalic and atraumatic.  Eyes:     General:        Right eye: No discharge.        Left eye: No discharge.     Conjunctiva/sclera: Conjunctivae normal.  Pulmonary:     Effort: No respiratory distress.  Musculoskeletal:     Comments: Left foot: TTP of distal metatarsals 1-3. TTP of DIP, PIP, and MCP of phalanx 1-3. Edema noted to 1st MTP. Difficulty bear weight. Able to plantar flex and dorsiflex ankle. Inversion/eversion intact. Capillary refill <2sec. Dorsalis pedis pulse present. Sensation intact. Warm to touch.    Neurological:     Mental Status: He is alert.     Comments: Clear speech.   Psychiatric:        Behavior: Behavior normal.        Thought Content: Thought content normal.     ED Results / Procedures / Treatments   Labs (all labs ordered are listed, but only abnormal results are displayed) Labs Reviewed - No data to display  EKG None  Radiology DG Foot Complete Left  Result Date: 08/20/2022 CLINICAL DATA:  Foot injury. Trial or fell on foot. Patient reports remote foot fractures. EXAM: LEFT FOOT - COMPLETE 3+ VIEW COMPARISON:  Foot radiograph 06/10/2021 FINDINGS: No acute fracture. No dislocation. Suspect remote healed fifth metatarsal  fracture that is similar to prior exam. No erosive change. Ifeoma Vallin ossific density adjacent to the anterior aspect of the distal tibia has a chronic appearance. Slight dorsal soft tissue edema. IMPRESSION: 1. No acute fracture or dislocation. 2. Mild dorsal soft tissue edema. 3. Suspect remote healed fifth metatarsal fracture. Electronically Signed   By: Narda Rutherford M.D.   On: 08/20/2022 23:04    Procedures Procedures    Medications Ordered in ED Medications  oxyCODONE-acetaminophen (PERCOCET/ROXICET) 5-325 MG per tablet 1 tablet (1 tablet Oral Given 08/20/22 2301)  ED Course/ Medical Decision Making/ A&P                             Medical Decision Making Patient is a 52 year old male, who dropped a trailer on his foot, around 5 PM today.  We will obtain x-ray, as he is tender to his foot mainly his 1 through 3 metatarsals, and phalanx.  We will give him Percocet for pain control.  Amount and/or Complexity of Data Reviewed Radiology: ordered.    Details: X-ray shows soft tissue findings, but no acute fractures Discussion of management or test interpretation with external provider(s): Discussed with patient, there are no fractures on his x-ray, likely just soft tissue contusion, Hu/crush injury.  Provided with information and start strict return precautions such as severe pain out of proportion, woody feeling in his foot, and loss of pulse.  He voiced understanding, I provided him with a hard toe shoe, to help with support, and discussed need for return, and follow-up with PCP for repeat x-ray to ensure no fracture.  He voiced understanding and was in agreement with plan Toradol sent to the pharmacy for pain control.  He is able to ambulate thus declined crutches.  Risk Prescription drug management.    Final Clinical Impression(s) / ED Diagnoses Final diagnoses:  Foot injury, left, initial encounter    Rx / DC Orders ED Discharge Orders          Ordered    ketorolac (TORADOL)  10 MG tablet  Every 6 hours PRN        08/20/22 2311              Akari Crysler, Harley Alto, PA 08/20/22 2314    Jacalyn Lefevre, MD 08/20/22 2319

## 2022-08-20 NOTE — Discharge Instructions (Signed)
Please follow-up with your primary care doctor, make sure you drink lots of fluids, and rest.  Make sure that you are elevating your foot and icing it, and you can take the Toradol for pain control along with Tylenol.  I have supplied you with a hard shoe, and to help provide support.  If you persistently have pain you may need to have it re-x-rayed.

## 2022-08-20 NOTE — ED Triage Notes (Signed)
Reports pain to L foot s/p dropping a 2T trailer on foot around 1700. Ambulatory on arrival.

## 2022-08-21 ENCOUNTER — Other Ambulatory Visit: Payer: Self-pay

## 2022-08-23 DIAGNOSIS — N186 End stage renal disease: Secondary | ICD-10-CM | POA: Diagnosis not present

## 2022-08-23 DIAGNOSIS — N2581 Secondary hyperparathyroidism of renal origin: Secondary | ICD-10-CM | POA: Diagnosis not present

## 2022-08-23 DIAGNOSIS — E1122 Type 2 diabetes mellitus with diabetic chronic kidney disease: Secondary | ICD-10-CM | POA: Diagnosis not present

## 2022-08-23 DIAGNOSIS — L299 Pruritus, unspecified: Secondary | ICD-10-CM | POA: Diagnosis not present

## 2022-08-23 DIAGNOSIS — D631 Anemia in chronic kidney disease: Secondary | ICD-10-CM | POA: Diagnosis not present

## 2022-08-23 DIAGNOSIS — D689 Coagulation defect, unspecified: Secondary | ICD-10-CM | POA: Diagnosis not present

## 2022-08-23 DIAGNOSIS — Z992 Dependence on renal dialysis: Secondary | ICD-10-CM | POA: Diagnosis not present

## 2022-08-26 DIAGNOSIS — Z992 Dependence on renal dialysis: Secondary | ICD-10-CM | POA: Diagnosis not present

## 2022-08-26 DIAGNOSIS — E1122 Type 2 diabetes mellitus with diabetic chronic kidney disease: Secondary | ICD-10-CM | POA: Diagnosis not present

## 2022-08-26 DIAGNOSIS — N2581 Secondary hyperparathyroidism of renal origin: Secondary | ICD-10-CM | POA: Diagnosis not present

## 2022-08-26 DIAGNOSIS — D631 Anemia in chronic kidney disease: Secondary | ICD-10-CM | POA: Diagnosis not present

## 2022-08-26 DIAGNOSIS — N186 End stage renal disease: Secondary | ICD-10-CM | POA: Diagnosis not present

## 2022-08-26 DIAGNOSIS — D689 Coagulation defect, unspecified: Secondary | ICD-10-CM | POA: Diagnosis not present

## 2022-08-26 DIAGNOSIS — L299 Pruritus, unspecified: Secondary | ICD-10-CM | POA: Diagnosis not present

## 2022-08-28 DIAGNOSIS — N2581 Secondary hyperparathyroidism of renal origin: Secondary | ICD-10-CM | POA: Diagnosis not present

## 2022-08-28 DIAGNOSIS — N186 End stage renal disease: Secondary | ICD-10-CM | POA: Diagnosis not present

## 2022-08-28 DIAGNOSIS — E1122 Type 2 diabetes mellitus with diabetic chronic kidney disease: Secondary | ICD-10-CM | POA: Diagnosis not present

## 2022-08-28 DIAGNOSIS — D689 Coagulation defect, unspecified: Secondary | ICD-10-CM | POA: Diagnosis not present

## 2022-08-28 DIAGNOSIS — L299 Pruritus, unspecified: Secondary | ICD-10-CM | POA: Diagnosis not present

## 2022-08-28 DIAGNOSIS — Z992 Dependence on renal dialysis: Secondary | ICD-10-CM | POA: Diagnosis not present

## 2022-08-28 DIAGNOSIS — D631 Anemia in chronic kidney disease: Secondary | ICD-10-CM | POA: Diagnosis not present

## 2022-08-30 DIAGNOSIS — D689 Coagulation defect, unspecified: Secondary | ICD-10-CM | POA: Diagnosis not present

## 2022-08-30 DIAGNOSIS — N2581 Secondary hyperparathyroidism of renal origin: Secondary | ICD-10-CM | POA: Diagnosis not present

## 2022-08-30 DIAGNOSIS — N186 End stage renal disease: Secondary | ICD-10-CM | POA: Diagnosis not present

## 2022-08-30 DIAGNOSIS — E1122 Type 2 diabetes mellitus with diabetic chronic kidney disease: Secondary | ICD-10-CM | POA: Diagnosis not present

## 2022-08-30 DIAGNOSIS — L299 Pruritus, unspecified: Secondary | ICD-10-CM | POA: Diagnosis not present

## 2022-08-30 DIAGNOSIS — D631 Anemia in chronic kidney disease: Secondary | ICD-10-CM | POA: Diagnosis not present

## 2022-08-30 DIAGNOSIS — Z992 Dependence on renal dialysis: Secondary | ICD-10-CM | POA: Diagnosis not present

## 2022-09-02 DIAGNOSIS — D631 Anemia in chronic kidney disease: Secondary | ICD-10-CM | POA: Diagnosis not present

## 2022-09-02 DIAGNOSIS — N2581 Secondary hyperparathyroidism of renal origin: Secondary | ICD-10-CM | POA: Diagnosis not present

## 2022-09-02 DIAGNOSIS — E1122 Type 2 diabetes mellitus with diabetic chronic kidney disease: Secondary | ICD-10-CM | POA: Diagnosis not present

## 2022-09-02 DIAGNOSIS — L299 Pruritus, unspecified: Secondary | ICD-10-CM | POA: Diagnosis not present

## 2022-09-02 DIAGNOSIS — N186 End stage renal disease: Secondary | ICD-10-CM | POA: Diagnosis not present

## 2022-09-02 DIAGNOSIS — Z992 Dependence on renal dialysis: Secondary | ICD-10-CM | POA: Diagnosis not present

## 2022-09-02 DIAGNOSIS — D689 Coagulation defect, unspecified: Secondary | ICD-10-CM | POA: Diagnosis not present

## 2022-09-02 DIAGNOSIS — E1129 Type 2 diabetes mellitus with other diabetic kidney complication: Secondary | ICD-10-CM | POA: Diagnosis not present

## 2022-09-03 DIAGNOSIS — N186 End stage renal disease: Secondary | ICD-10-CM | POA: Diagnosis not present

## 2022-09-04 DIAGNOSIS — N186 End stage renal disease: Secondary | ICD-10-CM | POA: Diagnosis not present

## 2022-09-04 DIAGNOSIS — Z992 Dependence on renal dialysis: Secondary | ICD-10-CM | POA: Diagnosis not present

## 2022-09-04 DIAGNOSIS — N2581 Secondary hyperparathyroidism of renal origin: Secondary | ICD-10-CM | POA: Diagnosis not present

## 2022-09-06 DIAGNOSIS — Z992 Dependence on renal dialysis: Secondary | ICD-10-CM | POA: Diagnosis not present

## 2022-09-06 DIAGNOSIS — N2581 Secondary hyperparathyroidism of renal origin: Secondary | ICD-10-CM | POA: Diagnosis not present

## 2022-09-06 DIAGNOSIS — N186 End stage renal disease: Secondary | ICD-10-CM | POA: Diagnosis not present

## 2022-09-10 ENCOUNTER — Other Ambulatory Visit: Payer: Self-pay | Admitting: Family

## 2022-09-10 ENCOUNTER — Other Ambulatory Visit: Payer: Self-pay

## 2022-09-10 NOTE — Telephone Encounter (Signed)
Requested medication (s) are due for refill today: yes  Requested medication (s) are on the active medication list: yes  Last refill:  06/25/21  Future visit scheduled: no  Notes to clinic:  Unable to refill per protocol, last refill by another provider and needing updated labs as well.      Requested Prescriptions  Pending Prescriptions Disp Refills   calcium acetate (PHOSLO) 667 MG capsule 540 capsule 3    Sig: Take 2 capsules (1,334 mg total) by mouth 3 (three) times daily with meals     Endocrinology:  Minerals - Calcium Supplementation - calcium acetate Failed - 09/10/2022  4:35 PM      Failed - Phosphate in normal range and within 360 days    No results found for: "PHOS"       Failed - PTH in normal range and within 360 days    No results found for: "IOPTH", "PTHINTACTFNA", "PTH"       Failed - Ca in normal range and within 360 days    Calcium  Date Value Ref Range Status  01/25/2022 8.2 (L) 8.9 - 10.3 mg/dL Final   Calcium, Ion  Date Value Ref Range Status  09/13/2021 1.04 (L) 1.15 - 1.40 mmol/L Final         Failed - Valid encounter within last 12 months    Recent Outpatient Visits           1 year ago Encounter for completion of form with patient   [facility], Salomon Fick, NP   1 year ago Hospital discharge follow-up   [facility], [place], NP   1 year ago Annual physical exam   [facility], Amy J, NP   1 year ago Encounter to establish care   [facility], [place], NP   2 years ago Essential hypertension   [facility] [place], Cornelius Moras, RPH-CPP

## 2022-09-11 ENCOUNTER — Other Ambulatory Visit: Payer: Self-pay

## 2022-09-11 DIAGNOSIS — Z992 Dependence on renal dialysis: Secondary | ICD-10-CM | POA: Diagnosis not present

## 2022-09-11 DIAGNOSIS — N2581 Secondary hyperparathyroidism of renal origin: Secondary | ICD-10-CM | POA: Diagnosis not present

## 2022-09-11 DIAGNOSIS — N186 End stage renal disease: Secondary | ICD-10-CM | POA: Diagnosis not present

## 2022-09-11 MED ORDER — CARVEDILOL 25 MG PO TABS
25.0000 mg | ORAL_TABLET | Freq: Two times a day (BID) | ORAL | 0 refills | Status: DC
  Filled 2022-09-11: qty 180, 90d supply, fill #0

## 2022-09-11 MED ORDER — HYDRALAZINE HCL 100 MG PO TABS
100.0000 mg | ORAL_TABLET | Freq: Three times a day (TID) | ORAL | 0 refills | Status: DC
  Filled 2022-09-11: qty 270, 90d supply, fill #0

## 2022-09-11 MED ORDER — AMLODIPINE BESYLATE 10 MG PO TABS
10.0000 mg | ORAL_TABLET | Freq: Every day | ORAL | 0 refills | Status: DC
  Filled 2022-09-11: qty 90, 90d supply, fill #0

## 2022-09-11 MED ORDER — NICOTINE 14 MG/24HR TD PT24
14.0000 mg | MEDICATED_PATCH | Freq: Every day | TRANSDERMAL | 0 refills | Status: DC | PRN
  Filled 2022-09-11: qty 28, 28d supply, fill #0

## 2022-09-11 MED ORDER — BUDESONIDE-FORMOTEROL FUMARATE 160-4.5 MCG/ACT IN AERO
2.0000 | INHALATION_SPRAY | Freq: Two times a day (BID) | RESPIRATORY_TRACT | 12 refills | Status: DC
  Filled 2022-09-11: qty 10.2, 30d supply, fill #0

## 2022-09-11 MED ORDER — SEVELAMER CARBONATE 800 MG PO TABS
1600.0000 mg | ORAL_TABLET | Freq: Three times a day (TID) | ORAL | 11 refills | Status: DC
  Filled 2022-09-11: qty 540, 90d supply, fill #0

## 2022-09-11 MED ORDER — BUDESONIDE-FORMOTEROL FUMARATE 160-4.5 MCG/ACT IN AERO
2.0000 | INHALATION_SPRAY | Freq: Two times a day (BID) | RESPIRATORY_TRACT | 11 refills | Status: DC
  Filled 2022-09-11: qty 10.2, 30d supply, fill #0

## 2022-09-11 MED ORDER — DOXAZOSIN MESYLATE 8 MG PO TABS
8.0000 mg | ORAL_TABLET | Freq: Every day | ORAL | 0 refills | Status: DC
  Filled 2022-09-11: qty 90, 90d supply, fill #0

## 2022-09-12 ENCOUNTER — Other Ambulatory Visit: Payer: Self-pay

## 2022-09-13 DIAGNOSIS — Z992 Dependence on renal dialysis: Secondary | ICD-10-CM | POA: Diagnosis not present

## 2022-09-13 DIAGNOSIS — N186 End stage renal disease: Secondary | ICD-10-CM | POA: Diagnosis not present

## 2022-09-13 DIAGNOSIS — N2581 Secondary hyperparathyroidism of renal origin: Secondary | ICD-10-CM | POA: Diagnosis not present

## 2022-09-17 DIAGNOSIS — Z992 Dependence on renal dialysis: Secondary | ICD-10-CM | POA: Diagnosis not present

## 2022-09-17 DIAGNOSIS — N186 End stage renal disease: Secondary | ICD-10-CM | POA: Diagnosis not present

## 2022-09-20 DIAGNOSIS — N186 End stage renal disease: Secondary | ICD-10-CM | POA: Diagnosis not present

## 2022-09-20 DIAGNOSIS — N2581 Secondary hyperparathyroidism of renal origin: Secondary | ICD-10-CM | POA: Diagnosis not present

## 2022-09-20 DIAGNOSIS — Z992 Dependence on renal dialysis: Secondary | ICD-10-CM | POA: Diagnosis not present

## 2022-09-24 DIAGNOSIS — Z992 Dependence on renal dialysis: Secondary | ICD-10-CM | POA: Diagnosis not present

## 2022-09-24 DIAGNOSIS — N186 End stage renal disease: Secondary | ICD-10-CM | POA: Diagnosis not present

## 2022-09-27 DIAGNOSIS — N186 End stage renal disease: Secondary | ICD-10-CM | POA: Diagnosis not present

## 2022-09-27 DIAGNOSIS — N2581 Secondary hyperparathyroidism of renal origin: Secondary | ICD-10-CM | POA: Diagnosis not present

## 2022-09-27 DIAGNOSIS — Z992 Dependence on renal dialysis: Secondary | ICD-10-CM | POA: Diagnosis not present

## 2022-10-01 DIAGNOSIS — N186 End stage renal disease: Secondary | ICD-10-CM | POA: Diagnosis not present

## 2022-10-01 DIAGNOSIS — Z992 Dependence on renal dialysis: Secondary | ICD-10-CM | POA: Diagnosis not present

## 2022-10-01 DIAGNOSIS — N2581 Secondary hyperparathyroidism of renal origin: Secondary | ICD-10-CM | POA: Diagnosis not present

## 2022-10-03 DIAGNOSIS — N2581 Secondary hyperparathyroidism of renal origin: Secondary | ICD-10-CM | POA: Diagnosis not present

## 2022-10-03 DIAGNOSIS — N186 End stage renal disease: Secondary | ICD-10-CM | POA: Diagnosis not present

## 2022-10-03 DIAGNOSIS — Z992 Dependence on renal dialysis: Secondary | ICD-10-CM | POA: Diagnosis not present

## 2022-10-04 DIAGNOSIS — N2581 Secondary hyperparathyroidism of renal origin: Secondary | ICD-10-CM | POA: Diagnosis not present

## 2022-10-04 DIAGNOSIS — Z992 Dependence on renal dialysis: Secondary | ICD-10-CM | POA: Diagnosis not present

## 2022-10-04 DIAGNOSIS — N186 End stage renal disease: Secondary | ICD-10-CM | POA: Diagnosis not present

## 2022-10-07 DIAGNOSIS — Z992 Dependence on renal dialysis: Secondary | ICD-10-CM | POA: Diagnosis not present

## 2022-10-07 DIAGNOSIS — N2581 Secondary hyperparathyroidism of renal origin: Secondary | ICD-10-CM | POA: Diagnosis not present

## 2022-10-07 DIAGNOSIS — N186 End stage renal disease: Secondary | ICD-10-CM | POA: Diagnosis not present

## 2022-10-09 DIAGNOSIS — N2581 Secondary hyperparathyroidism of renal origin: Secondary | ICD-10-CM | POA: Diagnosis not present

## 2022-10-09 DIAGNOSIS — N186 End stage renal disease: Secondary | ICD-10-CM | POA: Diagnosis not present

## 2022-10-09 DIAGNOSIS — Z992 Dependence on renal dialysis: Secondary | ICD-10-CM | POA: Diagnosis not present

## 2022-10-11 DIAGNOSIS — N2581 Secondary hyperparathyroidism of renal origin: Secondary | ICD-10-CM | POA: Diagnosis not present

## 2022-10-11 DIAGNOSIS — Z992 Dependence on renal dialysis: Secondary | ICD-10-CM | POA: Diagnosis not present

## 2022-10-11 DIAGNOSIS — N186 End stage renal disease: Secondary | ICD-10-CM | POA: Diagnosis not present

## 2022-10-14 DIAGNOSIS — N186 End stage renal disease: Secondary | ICD-10-CM | POA: Diagnosis not present

## 2022-10-14 DIAGNOSIS — N2581 Secondary hyperparathyroidism of renal origin: Secondary | ICD-10-CM | POA: Diagnosis not present

## 2022-10-14 DIAGNOSIS — Z992 Dependence on renal dialysis: Secondary | ICD-10-CM | POA: Diagnosis not present

## 2022-10-18 DIAGNOSIS — N186 End stage renal disease: Secondary | ICD-10-CM | POA: Diagnosis not present

## 2022-10-18 DIAGNOSIS — Z992 Dependence on renal dialysis: Secondary | ICD-10-CM | POA: Diagnosis not present

## 2022-10-18 DIAGNOSIS — N2581 Secondary hyperparathyroidism of renal origin: Secondary | ICD-10-CM | POA: Diagnosis not present

## 2022-10-21 DIAGNOSIS — Z992 Dependence on renal dialysis: Secondary | ICD-10-CM | POA: Diagnosis not present

## 2022-10-21 DIAGNOSIS — N186 End stage renal disease: Secondary | ICD-10-CM | POA: Diagnosis not present

## 2022-10-21 DIAGNOSIS — N2581 Secondary hyperparathyroidism of renal origin: Secondary | ICD-10-CM | POA: Diagnosis not present

## 2022-10-25 DIAGNOSIS — Z992 Dependence on renal dialysis: Secondary | ICD-10-CM | POA: Diagnosis not present

## 2022-10-25 DIAGNOSIS — N2581 Secondary hyperparathyroidism of renal origin: Secondary | ICD-10-CM | POA: Diagnosis not present

## 2022-10-25 DIAGNOSIS — N186 End stage renal disease: Secondary | ICD-10-CM | POA: Diagnosis not present

## 2022-10-28 DIAGNOSIS — N186 End stage renal disease: Secondary | ICD-10-CM | POA: Diagnosis not present

## 2022-10-28 DIAGNOSIS — N2581 Secondary hyperparathyroidism of renal origin: Secondary | ICD-10-CM | POA: Diagnosis not present

## 2022-10-28 DIAGNOSIS — Z992 Dependence on renal dialysis: Secondary | ICD-10-CM | POA: Diagnosis not present

## 2022-10-30 DIAGNOSIS — N186 End stage renal disease: Secondary | ICD-10-CM | POA: Diagnosis not present

## 2022-10-30 DIAGNOSIS — Z992 Dependence on renal dialysis: Secondary | ICD-10-CM | POA: Diagnosis not present

## 2022-10-30 DIAGNOSIS — N2581 Secondary hyperparathyroidism of renal origin: Secondary | ICD-10-CM | POA: Diagnosis not present

## 2022-11-01 DIAGNOSIS — N2581 Secondary hyperparathyroidism of renal origin: Secondary | ICD-10-CM | POA: Diagnosis not present

## 2022-11-01 DIAGNOSIS — Z992 Dependence on renal dialysis: Secondary | ICD-10-CM | POA: Diagnosis not present

## 2022-11-01 DIAGNOSIS — N186 End stage renal disease: Secondary | ICD-10-CM | POA: Diagnosis not present

## 2022-11-02 DIAGNOSIS — Z992 Dependence on renal dialysis: Secondary | ICD-10-CM | POA: Diagnosis not present

## 2022-11-02 DIAGNOSIS — E1129 Type 2 diabetes mellitus with other diabetic kidney complication: Secondary | ICD-10-CM | POA: Diagnosis not present

## 2022-11-02 DIAGNOSIS — N186 End stage renal disease: Secondary | ICD-10-CM | POA: Diagnosis not present

## 2022-11-04 DIAGNOSIS — N186 End stage renal disease: Secondary | ICD-10-CM | POA: Diagnosis not present

## 2022-11-04 DIAGNOSIS — N2581 Secondary hyperparathyroidism of renal origin: Secondary | ICD-10-CM | POA: Diagnosis not present

## 2022-11-04 DIAGNOSIS — Z992 Dependence on renal dialysis: Secondary | ICD-10-CM | POA: Diagnosis not present

## 2022-11-06 DIAGNOSIS — Z992 Dependence on renal dialysis: Secondary | ICD-10-CM | POA: Diagnosis not present

## 2022-11-06 DIAGNOSIS — N2581 Secondary hyperparathyroidism of renal origin: Secondary | ICD-10-CM | POA: Diagnosis not present

## 2022-11-06 DIAGNOSIS — N186 End stage renal disease: Secondary | ICD-10-CM | POA: Diagnosis not present

## 2022-11-08 DIAGNOSIS — N186 End stage renal disease: Secondary | ICD-10-CM | POA: Diagnosis not present

## 2022-11-08 DIAGNOSIS — N2581 Secondary hyperparathyroidism of renal origin: Secondary | ICD-10-CM | POA: Diagnosis not present

## 2022-11-08 DIAGNOSIS — Z992 Dependence on renal dialysis: Secondary | ICD-10-CM | POA: Diagnosis not present

## 2022-11-11 DIAGNOSIS — N186 End stage renal disease: Secondary | ICD-10-CM | POA: Diagnosis not present

## 2022-11-11 DIAGNOSIS — Z992 Dependence on renal dialysis: Secondary | ICD-10-CM | POA: Diagnosis not present

## 2022-11-11 DIAGNOSIS — N2581 Secondary hyperparathyroidism of renal origin: Secondary | ICD-10-CM | POA: Diagnosis not present

## 2022-11-13 ENCOUNTER — Encounter: Payer: Self-pay | Admitting: Pharmacist

## 2022-11-13 ENCOUNTER — Other Ambulatory Visit: Payer: Self-pay

## 2022-11-13 DIAGNOSIS — Z992 Dependence on renal dialysis: Secondary | ICD-10-CM | POA: Diagnosis not present

## 2022-11-13 DIAGNOSIS — N186 End stage renal disease: Secondary | ICD-10-CM | POA: Diagnosis not present

## 2022-11-13 DIAGNOSIS — N2581 Secondary hyperparathyroidism of renal origin: Secondary | ICD-10-CM | POA: Diagnosis not present

## 2022-11-15 DIAGNOSIS — Z992 Dependence on renal dialysis: Secondary | ICD-10-CM | POA: Diagnosis not present

## 2022-11-15 DIAGNOSIS — N186 End stage renal disease: Secondary | ICD-10-CM | POA: Diagnosis not present

## 2022-11-15 DIAGNOSIS — N2581 Secondary hyperparathyroidism of renal origin: Secondary | ICD-10-CM | POA: Diagnosis not present

## 2022-11-18 DIAGNOSIS — Z992 Dependence on renal dialysis: Secondary | ICD-10-CM | POA: Diagnosis not present

## 2022-11-18 DIAGNOSIS — N2581 Secondary hyperparathyroidism of renal origin: Secondary | ICD-10-CM | POA: Diagnosis not present

## 2022-11-18 DIAGNOSIS — N186 End stage renal disease: Secondary | ICD-10-CM | POA: Diagnosis not present

## 2022-11-20 DIAGNOSIS — Z992 Dependence on renal dialysis: Secondary | ICD-10-CM | POA: Diagnosis not present

## 2022-11-20 DIAGNOSIS — N186 End stage renal disease: Secondary | ICD-10-CM | POA: Diagnosis not present

## 2022-11-20 DIAGNOSIS — N2581 Secondary hyperparathyroidism of renal origin: Secondary | ICD-10-CM | POA: Diagnosis not present

## 2022-11-22 DIAGNOSIS — N186 End stage renal disease: Secondary | ICD-10-CM | POA: Diagnosis not present

## 2022-11-22 DIAGNOSIS — Z992 Dependence on renal dialysis: Secondary | ICD-10-CM | POA: Diagnosis not present

## 2022-11-22 DIAGNOSIS — N2581 Secondary hyperparathyroidism of renal origin: Secondary | ICD-10-CM | POA: Diagnosis not present

## 2022-11-24 ENCOUNTER — Other Ambulatory Visit: Payer: Self-pay | Admitting: Cardiovascular Disease

## 2022-11-24 ENCOUNTER — Telehealth: Payer: Self-pay | Admitting: Cardiovascular Disease

## 2022-11-24 ENCOUNTER — Other Ambulatory Visit: Payer: Self-pay

## 2022-11-24 ENCOUNTER — Other Ambulatory Visit: Payer: Self-pay | Admitting: Family

## 2022-11-24 MED ORDER — CARVEDILOL 25 MG PO TABS
25.0000 mg | ORAL_TABLET | Freq: Two times a day (BID) | ORAL | 0 refills | Status: DC
  Filled 2022-11-24 – 2022-11-25 (×2): qty 180, 90d supply, fill #0

## 2022-11-24 MED ORDER — DOXAZOSIN MESYLATE 8 MG PO TABS
8.0000 mg | ORAL_TABLET | Freq: Every day | ORAL | 0 refills | Status: DC
  Filled 2022-11-24: qty 30, 30d supply, fill #0

## 2022-11-24 MED ORDER — SEVELAMER CARBONATE 800 MG PO TABS
1600.0000 mg | ORAL_TABLET | Freq: Three times a day (TID) | ORAL | 11 refills | Status: DC
  Filled 2022-11-24: qty 540, 90d supply, fill #0

## 2022-11-24 MED ORDER — AMLODIPINE BESYLATE 10 MG PO TABS
10.0000 mg | ORAL_TABLET | Freq: Every day | ORAL | 0 refills | Status: DC
  Filled 2022-11-24 – 2022-11-25 (×2): qty 90, 90d supply, fill #0

## 2022-11-24 MED ORDER — HYDRALAZINE HCL 100 MG PO TABS
100.0000 mg | ORAL_TABLET | Freq: Three times a day (TID) | ORAL | 0 refills | Status: DC
  Filled 2022-11-24 – 2022-11-25 (×2): qty 270, 90d supply, fill #0

## 2022-11-24 NOTE — Telephone Encounter (Signed)
Patient is out of medication

## 2022-11-24 NOTE — Telephone Encounter (Signed)
*  STAT* If patient is at the pharmacy, call can be transferred to refill team.   1. Which medications need to be refilled? (please list name of each medication and dose if known) doxazosin (CARDURA) 8 MG tablet   2. Which pharmacy/location (including street and city if local pharmacy) is medication to be sent to? [facility]   3. Do they need a 30 day or 90 day supply? 90

## 2022-11-25 ENCOUNTER — Other Ambulatory Visit: Payer: Self-pay

## 2022-11-25 DIAGNOSIS — Z992 Dependence on renal dialysis: Secondary | ICD-10-CM | POA: Diagnosis not present

## 2022-11-25 DIAGNOSIS — N2581 Secondary hyperparathyroidism of renal origin: Secondary | ICD-10-CM | POA: Diagnosis not present

## 2022-11-25 DIAGNOSIS — N186 End stage renal disease: Secondary | ICD-10-CM | POA: Diagnosis not present

## 2022-11-25 MED ORDER — DOXAZOSIN MESYLATE 8 MG PO TABS
8.0000 mg | ORAL_TABLET | Freq: Every day | ORAL | 0 refills | Status: DC
  Filled 2022-11-25: qty 30, 30d supply, fill #0

## 2022-11-25 NOTE — Telephone Encounter (Signed)
Pt's medication was sent to pt's pharmacy as requested. Confirmation received.  °

## 2022-11-27 DIAGNOSIS — N186 End stage renal disease: Secondary | ICD-10-CM | POA: Diagnosis not present

## 2022-11-27 DIAGNOSIS — N2581 Secondary hyperparathyroidism of renal origin: Secondary | ICD-10-CM | POA: Diagnosis not present

## 2022-11-27 DIAGNOSIS — Z992 Dependence on renal dialysis: Secondary | ICD-10-CM | POA: Diagnosis not present

## 2022-11-29 DIAGNOSIS — N2581 Secondary hyperparathyroidism of renal origin: Secondary | ICD-10-CM | POA: Diagnosis not present

## 2022-11-29 DIAGNOSIS — Z992 Dependence on renal dialysis: Secondary | ICD-10-CM | POA: Diagnosis not present

## 2022-11-29 DIAGNOSIS — N186 End stage renal disease: Secondary | ICD-10-CM | POA: Diagnosis not present

## 2022-12-02 DIAGNOSIS — Z992 Dependence on renal dialysis: Secondary | ICD-10-CM | POA: Diagnosis not present

## 2022-12-02 DIAGNOSIS — N186 End stage renal disease: Secondary | ICD-10-CM | POA: Diagnosis not present

## 2022-12-02 DIAGNOSIS — N2581 Secondary hyperparathyroidism of renal origin: Secondary | ICD-10-CM | POA: Diagnosis not present

## 2022-12-03 DIAGNOSIS — N186 End stage renal disease: Secondary | ICD-10-CM | POA: Diagnosis not present

## 2022-12-03 DIAGNOSIS — Z992 Dependence on renal dialysis: Secondary | ICD-10-CM | POA: Diagnosis not present

## 2022-12-03 DIAGNOSIS — E1129 Type 2 diabetes mellitus with other diabetic kidney complication: Secondary | ICD-10-CM | POA: Diagnosis not present

## 2022-12-03 NOTE — Progress Notes (Signed)
Cardiology Office Note:   Date:  12/05/2022  NAME:  Matthew Odom    MRN: [id number] DOB:  05-04-1971   PCP:  Rema Fendt, NP  Cardiologist:  Reatha Harps, MD  Electrophysiologist:  None   Referring MD: Rema Fendt, NP   Chief Complaint  Patient presents with   Follow-up    History of Present Illness:   Matthew Odom is a 52 y.o. male with a hx of patient systolic heart failure, ESRD, hypertension, nonobstructive CAD who presents for follow-up.  He presents with his wife.  Blood pressure seems to be well-controlled.  On carvedilol and hydralazine and amlodipine.  Most recent echo shows an EF of 45 to 50%.  I reviewed his stress test.  I think this is artifact.  He denies any chest pains or trouble breathing.  Diabetes is poorly controlled.  He is working on this.  He is not on a statin.  Had issues with Lipitor.  Would like to recheck his labs today.  He is still making urine and has symptoms of BPH.  He is on Cardura.  Refill provided.  I recommended he see urology.  He also describes some erectile dysfunction.  Would recommend evaluation by urology.  He is a bit upset with his dialysis situation.  He believes he may be able to come off as he still makes urine.  I recommended he follow-up with his nephrologist.  Denies any chest pains or trouble breathing.  Left heart cath in 2017 with nonobstructive disease.  He did have SVT last year but no recurrence.  Controlled on a beta-blocker.  EKG shows LVH.  Still smoking.  We did discuss the importance of smoking cessation.   Problem List 1. HTN 2. Systolic HF -EF 45-50% 11/13/2021 -LHC 02/2016 [facility] -30% RCA, normal LAD/LCX 3. ESRD on HD 4. DM -A1c 10.5 -T chol 244, LDL 163, HDL 52, TG 157 5. Cocaine use  6. SVT  -11/2021 -converted on adenosine   Past Medical History: Past Medical History:  Diagnosis Date   Anxiety    BPH (benign prostatic hyperplasia)    Cocaine use    07/15/21- "last time was more than 10 years ago."    Complication of anesthesia    wakes up during surgery   COPD (chronic obstructive pulmonary disease) (HCC)    Coronary artery disease 2021   mild non obstructed   Diabetes mellitus without complication (HCC)    Enlarged heart    ESRD (end stage renal disease) (HCC)    TTHSAT   History of degenerative disc disease    Hyperlipidemia    Hypertension    NSTEMI (non-ST elevated myocardial infarction) (HCC)    Pneumonia    Sciatic nerve pain    Sleep apnea    Stroke (HCC)    no residual, x 3 last one was in 2020   Thoracic ascending aortic aneurysm (HCC)    4.2 cm 08/03/21 CTA chest    Past Surgical History: Past Surgical History:  Procedure Laterality Date   AV FISTULA PLACEMENT Left 07/17/2021   Procedure: CREATION  OF LEFT ARM RADIOCEPHALIC ARTERIOVENOUS (AV) FISTULA;  Surgeon: Nada Libman, MD;  Location: [facility] OR;  Service: Vascular;  Laterality: Left;   CARDIAC CATHETERIZATION     IR FLUORO GUIDE CV LINE RIGHT  05/20/2021   IR US GUIDE VASC ACCESS RIGHT  05/20/2021   LIGATION OF COMPETING BRANCHES OF ARTERIOVENOUS FISTULA Left 09/13/2021   Procedure: LIGATION AND ELEVATION  OF COMPETING BRANCHES OF LEFT ARM ARTERIOVENOUS FISTULA;  Surgeon: Nada Libman, MD;  Location: [facility] OR;  Service: Vascular;  Laterality: Left;    Current Medications: Current Meds  Medication Sig   albuterol (PROVENTIL) (2.5 MG/3ML) 0.083% nebulizer solution Take 3 mLs (2.5 mg total) by nebulization every 6 (six) hours as needed for wheezing or shortness of breath.   budesonide-formoterol (SYMBICORT) 160-4.5 MCG/ACT inhaler Inhale 2 puffs into the lungs 2 (two) times daily.   calcium acetate (PHOSLO) 667 MG capsule Take 2 capsules (1,334 mg total) by mouth 3 (three) times daily with meals   Calcium Carbonate Antacid (TUMS PO) Take 2 tablets by mouth 3 (three) times daily with meals.   Insulin Glargine (BASAGLAR KWIKPEN) 100 UNIT/ML Inject 20 Units into the skin daily. (Patient taking differently: Inject  10 Units into the skin 2 (two) times daily.)   Insulin Pen Needle 31G X 6 MM MISC Use as directed in the morning, at noon, in the evening, and at bedtime.   liraglutide (VICTOZA) 18 MG/3ML SOPN Inject 1.8 mg into the skin daily.   Misc. Devices MISC Please provide patient with insurance approved CPAP with the following settings:  autopap 15-20.  Large size [employer] Full Face Mask Forma mask and heated humidification. (Patient taking differently: Please provide patient with insurance approved CPAP with the following settings:  autopap 15-20.  Large size [employer] Full Face Mask Forma mask and heated humidification.)   nitroGLYCERIN (NITROSTAT) 0.4 MG SL tablet Place 1 tablet (0.4 mg total) under the tongue every 5 (five) minutes as needed for up to 30 doses for chest pain.   sevelamer carbonate (RENVELA) 800 MG tablet Take 2 tablets (1,600 mg total) by mouth 3 (three) times daily with meals.   sevelamer carbonate (RENVELA) 800 MG tablet Take 2 tablets (1,600 mg total) by mouth 3 (three) times daily with meals.   [DISCONTINUED] amLODipine (NORVASC) 10 MG tablet Take 1 tablet (10 mg total) by mouth daily.   [DISCONTINUED] carvedilol (COREG) 25 MG tablet Take 1 tablet (25 mg total) by mouth 2 (two) times daily with a meal.   [DISCONTINUED] doxazosin (CARDURA) 8 MG tablet Take 1 tablet (8 mg total) by mouth daily.Please call our office to schedule an overdue appointment with Dr. Flora Lipps before anymore refills. [phone]   [DISCONTINUED] hydrALAZINE (APRESOLINE) 100 MG tablet Take 1 tablet (100 mg total) by mouth 3 (three) times daily.     Allergies:    Aspirin and Nitroglycerin   Social History: Social History   Socioeconomic History   Marital status: Married    Spouse name: Not on file   Number of children: 7   Years of education: Not on file   Highest education level: Not on file  Occupational History   Occupation: disabled  Tobacco Use   Smoking status: Every Day    Current  packs/day: 0.50    Average packs/day: 0.5 packs/day for 40.0 years (20.0 ttl pk-yrs)    Types: Cigarettes   Smokeless tobacco: Never   Tobacco comments:    Started smoking again in April.  1/2 ppd.    Vaping Use   Vaping status: Never Used  Substance and Sexual Activity   Alcohol use: Not Currently    Comment: very little   Drug use: Not Currently    Comment: "years ago" per patient   Sexual activity: Yes  Other Topics Concern   Not on file  Social History Narrative   Not on file   Social Determinants  of Health   Financial Resource Strain: Not on file  Food Insecurity: Not on file  Transportation Needs: Not on file  Physical Activity: Not on file  Stress: Not on file  Social Connections: Unknown (09/13/2021)   Received from [facility]   Social Network    Social Network: Not on file     Family History: The patient's family history includes Diabetes in his mother; Emphysema in his father; Heart attack in his mother; Heart disease in his sister; Hypertension in his father, mother, and sister.  ROS:   All other ROS reviewed and negative. Pertinent positives noted in the HPI.     EKGs/Labs/Other Studies Reviewed:   The following studies were personally reviewed by me today:  EKG:  EKG is ordered today.    EKG Interpretation Date/Time:  Friday December 05 2022 08:36:52 EDT Ventricular Rate:  76 PR Interval:  196 QRS Duration:  134 QT Interval:  430 QTC Calculation: 483 R Axis:   51  Text Interpretation: Normal sinus rhythm Non-specific intra-ventricular conduction block Minimal voltage criteria for LVH, may be normal variant ( Cornell product ) Confirmed by Lennie Odor [phone]) on 12/05/2022 8:47:27 AM   NM Stress 12/11/2021 Fixed perfusion defect at apex and apical inferior wall, suggesting prior infarct vs artifact. Moderate LV systolic dysfunction (EF 32%) and severe LV dilatation High risk study due to systolic dysfunction.  No ischemia  Recent Labs: 01/25/2022:  BUN 29; Creatinine, Ser 3.68; Hemoglobin 11.8; Magnesium 1.7; Platelets 223; Potassium 3.8; Sodium 136   Recent Lipid Panel    Component Value Date/Time   CHOL 220 (H) 01/29/2021 1132   TRIG 331 (H) 01/29/2021 1132   HDL 35 (L) 01/29/2021 1132   CHOLHDL 6.3 (H) 01/29/2021 1132   CHOLHDL 6.1 01/03/2018 0335   VLDL 34 01/03/2018 0335   LDLCALC 126 (H) 01/29/2021 1132    Physical Exam:   VS:  BP 132/80 (BP Location: Right Arm, Patient Position: Sitting, Cuff Size: Large)   Pulse 76   Ht 5\' 11"  (1.803 m)   Wt 270 lb (122.5 kg)   SpO2 95%   BMI 37.66 kg/m    Wt Readings from Last 3 Encounters:  12/05/22 270 lb (122.5 kg)  08/20/22 255 lb (115.7 kg)  07/11/22 235 lb (106.6 kg)    General: Well nourished, well developed, in no acute distress Head: Atraumatic, normal size  Eyes: PEERLA, EOMI  Neck: Supple, no JVD Endocrine: No thryomegaly Cardiac: Normal S1, S2; RRR; no murmurs, rubs, or gallops Lungs: Clear to auscultation bilaterally, no wheezing, rhonchi or rales  Abd: Soft, nontender, no hepatomegaly  Ext: No edema, pulses 2+ Musculoskeletal: No deformities, BUE and BLE strength normal and equal Skin: Warm and dry, no rashes   Neuro: Alert and oriented to person, place, time, and situation, CNII-XII grossly intact, no focal deficits  Psych: Normal mood and affect   ASSESSMENT:   Matthew Odom is a 52 y.o. male who presents for the following: 1. Chronic systolic congestive heart failure (HCC)   2. Coronary artery disease involving native coronary artery of native heart without angina pectoris   3. Mixed hyperlipidemia   4. SVT (supraventricular tachycardia)   5. Benign prostatic hyperplasia, unspecified whether lower urinary tract symptoms present   6. Tobacco abuse     PLAN:   1. Chronic systolic congestive heart failure (HCC) -EF 45 to 50%.  Likely related to hypertension and substance abuse in the past.  No longer using drugs.  Would  like to repeat his echo.  In the  meantime continue carvedilol 25 mg twice daily and hydralazine 100 mg 3 times daily.  We discussed adding Imdur but he would like to hold on this for now.  We will update his echo first.  2. Coronary artery disease involving native coronary artery of native heart without angina pectoris 3. Mixed hyperlipidemia -Nonobstructive disease in 2017.  Recent stress test likely artifact.  No symptoms of chest pain or trouble breathing.  EKG shows LVH.  He was on a statin but had issues with Lipitor.  He would like to recheck his lipids today.  We will likely rechallenge him on Crestor if he is amendable.  4. SVT (supraventricular tachycardia) -No further recurrence.  Controlled on beta-blocker.  5. Benign prostatic hyperplasia, unspecified whether lower urinary tract symptoms present -BPH noted.  Refill Cardura.  Referral to urology.  6. Tobacco abuse -Still smoking.  3 minutes of smoking cessation counseling was provided in office today.  Disposition: Return in about 6 months (around 06/07/2023).  Medication Adjustments/Labs and Tests Ordered: Current medicines are reviewed at length with the patient today.  Concerns regarding medicines are outlined above.  Orders Placed This Encounter  Procedures   Lipid panel   Ambulatory referral to Urology   EKG 12-Lead   ECHOCARDIOGRAM COMPLETE   Meds ordered this encounter  Medications   amLODipine (NORVASC) 10 MG tablet    Sig: Take 1 tablet (10 mg total) by mouth daily.    Dispense:  90 tablet    Refill:  3   hydrALAZINE (APRESOLINE) 100 MG tablet    Sig: Take 1 tablet (100 mg total) by mouth 3 (three) times daily.    Dispense:  270 tablet    Refill:  3   carvedilol (COREG) 25 MG tablet    Sig: Take 1 tablet (25 mg total) by mouth 2 (two) times daily with a meal.    Dispense:  180 tablet    Refill:  3   doxazosin (CARDURA) 8 MG tablet    Sig: Take 1 tablet (8 mg total) by mouth daily.    Dispense:  90 tablet    Refill:  3   Patient  Instructions  Medication Instructions:  NO CHANGES *If you need a refill on your cardiac medications before your next appointment, please call your pharmacy*   Lab Work: FASTING LIPID If you have labs (blood work) drawn today and your tests are completely normal, you will receive your results only by: MyChart Message (if you have MyChart) OR A paper copy in the mail If you have any lab test that is abnormal or we need to change your treatment, we will call you to review the results.   Testing/Procedures: Your physician has requested that you have an echocardiogram. Echocardiography is a painless test that uses sound waves to create images of your heart. It provides your doctor with information about the size and shape of your heart and how well your heart's chambers and valves are working. This procedure takes approximately one hour. There are no restrictions for this procedure. Please do NOT wear cologne, perfume, aftershave, or lotions (deodorant is allowed). Please arrive 15 minutes prior to your appointment time.    Follow-Up: At [facility], you and your health needs are our priority.  As part of our continuing mission to provide you with exceptional heart care, we have created designated Provider Care Teams.  These Care Teams include your primary Cardiologist (physician) and Advanced  Practice Providers (APPs -  Physician Assistants and Nurse Practitioners) who all work together to provide you with the care you need, when you need it.  We recommend signing up for the patient portal called "MyChart".  Sign up information is provided on this After Visit Summary.  MyChart is used to connect with patients for Virtual Visits (Telemedicine).  Patients are able to view lab/test results, encounter notes, upcoming appointments, etc.  Non-urgent messages can be sent to your provider as well.   To learn more about what you can do with MyChart, go to [email or web address].    Your  next appointment:   6 month(s)  Provider:   Reatha Harps, MD      Time Spent with Patient: I have spent a total of 35 minutes with patient reviewing hospital notes, telemetry, EKGs, labs and examining the patient as well as establishing an assessment and plan that was discussed with the patient.  > 50% of time was spent in direct patient care.  Signed, Lenna Gilford. Flora Lipps, MD, [facility]  [facility]  [address], [address] [place], [place] [address] [phone]  12/05/2022 9:24 AM

## 2022-12-05 ENCOUNTER — Other Ambulatory Visit: Payer: Self-pay

## 2022-12-05 ENCOUNTER — Encounter: Payer: Self-pay | Admitting: Cardiovascular Disease

## 2022-12-05 ENCOUNTER — Ambulatory Visit: Payer: Medicare HMO | Attending: Cardiovascular Disease | Admitting: Cardiovascular Disease

## 2022-12-05 VITALS — BP 132/80 | HR 76 | Ht 71.0 in | Wt 270.0 lb

## 2022-12-05 DIAGNOSIS — I471 Supraventricular tachycardia, unspecified: Secondary | ICD-10-CM

## 2022-12-05 DIAGNOSIS — N400 Enlarged prostate without lower urinary tract symptoms: Secondary | ICD-10-CM | POA: Diagnosis not present

## 2022-12-05 DIAGNOSIS — Z720 Tobacco use: Secondary | ICD-10-CM

## 2022-12-05 DIAGNOSIS — F17210 Nicotine dependence, cigarettes, uncomplicated: Secondary | ICD-10-CM

## 2022-12-05 DIAGNOSIS — E782 Mixed hyperlipidemia: Secondary | ICD-10-CM

## 2022-12-05 DIAGNOSIS — I2510 Atherosclerotic heart disease of native coronary artery without angina pectoris: Secondary | ICD-10-CM | POA: Diagnosis not present

## 2022-12-05 DIAGNOSIS — I5022 Chronic systolic (congestive) heart failure: Secondary | ICD-10-CM

## 2022-12-05 MED ORDER — HYDRALAZINE HCL 100 MG PO TABS
100.0000 mg | ORAL_TABLET | Freq: Three times a day (TID) | ORAL | 3 refills | Status: DC
  Filled 2022-12-05: qty 270, 90d supply, fill #0

## 2022-12-05 MED ORDER — DOXAZOSIN MESYLATE 8 MG PO TABS
8.0000 mg | ORAL_TABLET | Freq: Every day | ORAL | 3 refills | Status: AC
  Filled 2022-12-05: qty 90, 90d supply, fill #0

## 2022-12-05 MED ORDER — AMLODIPINE BESYLATE 10 MG PO TABS
10.0000 mg | ORAL_TABLET | Freq: Every day | ORAL | 3 refills | Status: DC
  Filled 2022-12-05: qty 90, 90d supply, fill #0

## 2022-12-05 MED ORDER — CARVEDILOL 25 MG PO TABS
25.0000 mg | ORAL_TABLET | Freq: Two times a day (BID) | ORAL | 3 refills | Status: DC
  Filled 2022-12-05: qty 180, 90d supply, fill #0

## 2022-12-05 NOTE — Patient Instructions (Signed)
Medication Instructions:  NO CHANGES *If you need a refill on your cardiac medications before your next appointment, please call your pharmacy*   Lab Work: FASTING LIPID If you have labs (blood work) drawn today and your tests are completely normal, you will receive your results only by: MyChart Message (if you have MyChart) OR A paper copy in the mail If you have any lab test that is abnormal or we need to change your treatment, we will call you to review the results.   Testing/Procedures: Your physician has requested that you have an echocardiogram. Echocardiography is a painless test that uses sound waves to create images of your heart. It provides your doctor with information about the size and shape of your heart and how well your heart's chambers and valves are working. This procedure takes approximately one hour. There are no restrictions for this procedure. Please do NOT wear cologne, perfume, aftershave, or lotions (deodorant is allowed). Please arrive 15 minutes prior to your appointment time.    Follow-Up: At [facility], you and your health needs are our priority.  As part of our continuing mission to provide you with exceptional heart care, we have created designated Provider Care Teams.  These Care Teams include your primary Cardiologist (physician) and Advanced Practice Providers (APPs -  Physician Assistants and Nurse Practitioners) who all work together to provide you with the care you need, when you need it.  We recommend signing up for the patient portal called "MyChart".  Sign up information is provided on this After Visit Summary.  MyChart is used to connect with patients for Virtual Visits (Telemedicine).  Patients are able to view lab/test results, encounter notes, upcoming appointments, etc.  Non-urgent messages can be sent to your provider as well.   To learn more about what you can do with MyChart, go to [email or web address].    Your next appointment:    6 month(s)  Provider:   Reatha Harps, MD

## 2022-12-09 ENCOUNTER — Encounter: Payer: Self-pay | Admitting: *Deleted

## 2022-12-09 ENCOUNTER — Telehealth: Payer: Self-pay | Admitting: *Deleted

## 2022-12-09 ENCOUNTER — Other Ambulatory Visit: Payer: Self-pay

## 2022-12-09 DIAGNOSIS — N186 End stage renal disease: Secondary | ICD-10-CM | POA: Diagnosis not present

## 2022-12-09 DIAGNOSIS — Z992 Dependence on renal dialysis: Secondary | ICD-10-CM | POA: Diagnosis not present

## 2022-12-09 DIAGNOSIS — N2581 Secondary hyperparathyroidism of renal origin: Secondary | ICD-10-CM | POA: Diagnosis not present

## 2022-12-09 MED ORDER — DOXAZOSIN MESYLATE 4 MG PO TABS
12.0000 mg | ORAL_TABLET | Freq: Every day | ORAL | 3 refills | Status: DC
  Filled 2022-12-09: qty 270, 90d supply, fill #0

## 2022-12-09 NOTE — Telephone Encounter (Signed)
Called no answer , unable to leave message . Letter  has been sent with result  patient has recall appointment for 1/ 2025.

## 2022-12-09 NOTE — Telephone Encounter (Signed)
-----   Message from Lenna Gilford [place] sent at 12/07/2022  9:20 PM EDT ----- Lipids not at goal. I will discuss with him at his follow-up. He had issues with statins so was not wanting to take anything yet. My chart. -W

## 2022-12-09 NOTE — Telephone Encounter (Signed)
Mr. Matthew Odom:   Your cholesterol is still a little elevated. We will discuss options for treatment at your next appointment. I know you issues with the last cholesterol medication.   Best,   Gerri Spore T. Flora Lipps, MD, [facility]  [facility] [address], [address] [place], [place] [address] [phone] 9:20 PM

## 2022-12-10 ENCOUNTER — Other Ambulatory Visit: Payer: Self-pay

## 2022-12-11 DIAGNOSIS — N2581 Secondary hyperparathyroidism of renal origin: Secondary | ICD-10-CM | POA: Diagnosis not present

## 2022-12-11 DIAGNOSIS — N186 End stage renal disease: Secondary | ICD-10-CM | POA: Diagnosis not present

## 2022-12-11 DIAGNOSIS — Z992 Dependence on renal dialysis: Secondary | ICD-10-CM | POA: Diagnosis not present

## 2022-12-12 ENCOUNTER — Other Ambulatory Visit: Payer: Self-pay

## 2022-12-13 DIAGNOSIS — N2581 Secondary hyperparathyroidism of renal origin: Secondary | ICD-10-CM | POA: Diagnosis not present

## 2022-12-13 DIAGNOSIS — N186 End stage renal disease: Secondary | ICD-10-CM | POA: Diagnosis not present

## 2022-12-13 DIAGNOSIS — Z992 Dependence on renal dialysis: Secondary | ICD-10-CM | POA: Diagnosis not present

## 2022-12-18 DIAGNOSIS — Z992 Dependence on renal dialysis: Secondary | ICD-10-CM | POA: Diagnosis not present

## 2022-12-18 DIAGNOSIS — N2581 Secondary hyperparathyroidism of renal origin: Secondary | ICD-10-CM | POA: Diagnosis not present

## 2022-12-18 DIAGNOSIS — N186 End stage renal disease: Secondary | ICD-10-CM | POA: Diagnosis not present

## 2022-12-20 DIAGNOSIS — N186 End stage renal disease: Secondary | ICD-10-CM | POA: Diagnosis not present

## 2022-12-20 DIAGNOSIS — Z992 Dependence on renal dialysis: Secondary | ICD-10-CM | POA: Diagnosis not present

## 2022-12-20 DIAGNOSIS — N2581 Secondary hyperparathyroidism of renal origin: Secondary | ICD-10-CM | POA: Diagnosis not present

## 2022-12-25 ENCOUNTER — Ambulatory Visit (HOSPITAL_COMMUNITY): Payer: Medicare HMO | Attending: Cardiovascular Disease

## 2022-12-25 DIAGNOSIS — I5022 Chronic systolic (congestive) heart failure: Secondary | ICD-10-CM

## 2022-12-25 DIAGNOSIS — Z992 Dependence on renal dialysis: Secondary | ICD-10-CM | POA: Diagnosis not present

## 2022-12-25 DIAGNOSIS — N186 End stage renal disease: Secondary | ICD-10-CM | POA: Diagnosis not present

## 2022-12-25 DIAGNOSIS — N2581 Secondary hyperparathyroidism of renal origin: Secondary | ICD-10-CM | POA: Diagnosis not present

## 2022-12-25 DIAGNOSIS — I471 Supraventricular tachycardia, unspecified: Secondary | ICD-10-CM | POA: Diagnosis not present

## 2022-12-25 LAB — ECHOCARDIOGRAM COMPLETE
Area-P 1/2: 3.68 cm2
S' Lateral: 4.5 cm

## 2022-12-30 DIAGNOSIS — Z992 Dependence on renal dialysis: Secondary | ICD-10-CM | POA: Diagnosis not present

## 2022-12-30 DIAGNOSIS — N2581 Secondary hyperparathyroidism of renal origin: Secondary | ICD-10-CM | POA: Diagnosis not present

## 2022-12-30 DIAGNOSIS — N186 End stage renal disease: Secondary | ICD-10-CM | POA: Diagnosis not present

## 2023-01-01 DIAGNOSIS — Z992 Dependence on renal dialysis: Secondary | ICD-10-CM | POA: Diagnosis not present

## 2023-01-01 DIAGNOSIS — N186 End stage renal disease: Secondary | ICD-10-CM | POA: Diagnosis not present

## 2023-01-01 DIAGNOSIS — N2581 Secondary hyperparathyroidism of renal origin: Secondary | ICD-10-CM | POA: Diagnosis not present

## 2023-01-03 DIAGNOSIS — N186 End stage renal disease: Secondary | ICD-10-CM | POA: Diagnosis not present

## 2023-01-03 DIAGNOSIS — Z992 Dependence on renal dialysis: Secondary | ICD-10-CM | POA: Diagnosis not present

## 2023-01-03 DIAGNOSIS — N2581 Secondary hyperparathyroidism of renal origin: Secondary | ICD-10-CM | POA: Diagnosis not present

## 2023-01-03 DIAGNOSIS — E1129 Type 2 diabetes mellitus with other diabetic kidney complication: Secondary | ICD-10-CM | POA: Diagnosis not present

## 2023-01-07 ENCOUNTER — Encounter: Payer: Medicare HMO | Admitting: Family

## 2023-01-07 NOTE — Progress Notes (Signed)
Erroneous encounter-disregard

## 2023-01-10 DIAGNOSIS — Z992 Dependence on renal dialysis: Secondary | ICD-10-CM | POA: Diagnosis not present

## 2023-01-10 DIAGNOSIS — N186 End stage renal disease: Secondary | ICD-10-CM | POA: Diagnosis not present

## 2023-01-10 DIAGNOSIS — N2581 Secondary hyperparathyroidism of renal origin: Secondary | ICD-10-CM | POA: Diagnosis not present

## 2023-01-14 ENCOUNTER — Ambulatory Visit (INDEPENDENT_AMBULATORY_CARE_PROVIDER_SITE_OTHER): Payer: Medicare HMO | Admitting: Family

## 2023-01-14 ENCOUNTER — Encounter: Payer: Self-pay | Admitting: Family

## 2023-01-14 ENCOUNTER — Other Ambulatory Visit: Payer: Self-pay

## 2023-01-14 VITALS — BP 194/118 | HR 90 | Temp 97.8°F | Ht 71.0 in | Wt 271.8 lb

## 2023-01-14 DIAGNOSIS — J449 Chronic obstructive pulmonary disease, unspecified: Secondary | ICD-10-CM

## 2023-01-14 DIAGNOSIS — E1165 Type 2 diabetes mellitus with hyperglycemia: Secondary | ICD-10-CM

## 2023-01-14 DIAGNOSIS — Z23 Encounter for immunization: Secondary | ICD-10-CM | POA: Diagnosis not present

## 2023-01-14 DIAGNOSIS — Z716 Tobacco abuse counseling: Secondary | ICD-10-CM | POA: Diagnosis not present

## 2023-01-14 DIAGNOSIS — F17210 Nicotine dependence, cigarettes, uncomplicated: Secondary | ICD-10-CM | POA: Diagnosis not present

## 2023-01-14 DIAGNOSIS — Z794 Long term (current) use of insulin: Secondary | ICD-10-CM

## 2023-01-14 DIAGNOSIS — Z1329 Encounter for screening for other suspected endocrine disorder: Secondary | ICD-10-CM | POA: Diagnosis not present

## 2023-01-14 DIAGNOSIS — F411 Generalized anxiety disorder: Secondary | ICD-10-CM

## 2023-01-14 DIAGNOSIS — Z7682 Awaiting organ transplant status: Secondary | ICD-10-CM | POA: Diagnosis not present

## 2023-01-14 DIAGNOSIS — Z1211 Encounter for screening for malignant neoplasm of colon: Secondary | ICD-10-CM

## 2023-01-14 MED ORDER — SERTRALINE HCL 25 MG PO TABS
25.0000 mg | ORAL_TABLET | Freq: Every day | ORAL | 2 refills | Status: DC
  Filled 2023-01-14: qty 30, 30d supply, fill #0

## 2023-01-14 MED ORDER — ALBUTEROL SULFATE (2.5 MG/3ML) 0.083% IN NEBU
2.5000 mg | INHALATION_SOLUTION | Freq: Four times a day (QID) | RESPIRATORY_TRACT | 3 refills | Status: DC | PRN
Start: 2023-01-14 — End: 2023-11-13
  Filled 2023-01-14: qty 150, 13d supply, fill #0

## 2023-01-14 MED ORDER — BUDESONIDE-FORMOTEROL FUMARATE 160-4.5 MCG/ACT IN AERO
2.0000 | INHALATION_SPRAY | Freq: Two times a day (BID) | RESPIRATORY_TRACT | 2 refills | Status: DC
  Filled 2023-01-14: qty 10.2, 30d supply, fill #0

## 2023-01-14 MED ORDER — NICOTINE 14 MG/24HR TD PT24
14.0000 mg | MEDICATED_PATCH | Freq: Every day | TRANSDERMAL | 2 refills | Status: DC | PRN
  Filled 2023-01-14: qty 28, 28d supply, fill #0

## 2023-01-14 NOTE — Progress Notes (Signed)
Patient ID: Matthew Odom, male    DOB: Feb 12, 1971  MRN: [id number]  CC: Colon Concerns  Subjective: [name] is a 52 y.o. male who presents for colon concerns.   His concerns today include:  - Reports he is on the kidney transplant list. Reports he needs colonoscopy, pneumonia vaccine, and tetanus vaccine prior to kidney transplant. States he had pneumonia 3 times and does not want the pneumonia vaccine on today.  - Doing well on Symbicort and Albuterol, no issues/concerns. Denies red flag symptoms. Reports he is no longer established with Pulmonology per his preference.  - Doing well on Nicoderm, no issues/concerns.  - Doing well on Zoloft, no issues/concerns. States in the past he was prescribed a "stronger medication than Zoloft" from Psychiatry. He would like referral back to the same. He denies thoughts of self-harm, suicidal ideations, homicidal ideations. - Established with Cardiology for chronic conditions management. States he had dialysis earlier today and did not take blood pressure medications due to he did not want his blood pressure to "drop too low". States he plans to take blood pressure medications once he returns home. He does not complain of red flag symptoms such as but not limited to chest pain, shortness of breath, worst headache of life, nausea/vomiting.  - Reports doing well on Basaglar and Victoza, no issues/concerns. Reports he is no longer established with Endocrinology per his preference. Reports his hemoglobin A1c was recently checked 3 months ago by his orthopedist (who was planning surgery at that time) and was 7%.   Patient Active Problem List   Diagnosis Date Noted   SVT (supraventricular tachycardia) 11/12/2021   COPD not affecting current episode of care 11/12/2021   OSA on CPAP 10/11/2021   Type 2 diabetes mellitus with chronic kidney disease on chronic dialysis, with long-term current use of insulin (HCC) 06/19/2021   AKI (acute kidney injury) (HCC)  05/07/2021   Hyperglycemia due to diabetes mellitus (HCC) 05/07/2021   Acute-on-chronic kidney injury (HCC) 05/07/2021   Hypokalemia 05/07/2021   Hypocalcemia 05/07/2021   Hyperglycemia 11/08/2020   Orthostatic hypotension 11/28/2019   Type 2 diabetes mellitus, with long-term current use of insulin (HCC) 09/27/2018   Chest pain 06/29/2018   Elevated troponin 06/29/2018   Degenerative spondylolisthesis 04/04/2018   Lumbar radiculopathy 04/04/2018   Spinal stenosis of lumbar region with neurogenic claudication 04/04/2018   Synovial cyst of lumbar facet joint 04/04/2018   ESRD on dialysis (HCC) 01/03/2018   Essential hypertension 01/02/2018   Hyperlipemia 01/02/2018   Abnormal EKG 01/02/2018   COPD with acute bronchitis (HCC) 01/02/2018   Nicotine abuse 01/02/2018   Chronic combined systolic and diastolic CHF, NYHA class 3 (HCC) 02/15/2016   LVH (left ventricular hypertrophy) due to hypertensive disease 02/15/2016   Nonischemic cardiomyopathy (HCC) 03/23/2015     Current Outpatient Medications on File Prior to Visit  Medication Sig Dispense Refill   acetaminophen (TYLENOL) 500 MG tablet Take 500-1,000 mg by mouth every 6 (six) hours as needed (pain.).     amLODipine (NORVASC) 10 MG tablet Take 1 tablet (10 mg total) by mouth daily. 90 tablet 3   atorvastatin (LIPITOR) 80 MG tablet Take 1 tablet (80 mg total) by mouth daily. 30 tablet 2   calcium acetate (PHOSLO) 667 MG capsule Take 2 capsules (1,334 mg total) by mouth 3 (three) times daily with meals 540 capsule 3   carvedilol (COREG) 25 MG tablet Take 1 tablet (25 mg total) by mouth 2 (two) times daily with  a meal. 180 tablet 3   doxazosin (CARDURA) 8 MG tablet Take 1 tablet (8 mg total) by mouth daily. 90 tablet 3   hydrALAZINE (APRESOLINE) 100 MG tablet Take 1 tablet (100 mg total) by mouth 3 (three) times daily. 270 tablet 3   Insulin Glargine (BASAGLAR KWIKPEN) 100 UNIT/ML Inject 20 Units into the skin daily. (Patient taking  differently: Inject 10 Units into the skin 2 (two) times daily.) 30 mL 3   Insulin Pen Needle 31G X 6 MM MISC Use as directed in the morning, at noon, in the evening, and at bedtime. 400 each 3   ketorolac (TORADOL) 10 MG tablet Take 1 tablet (10 mg total) by mouth every 6 (six) hours as needed. 20 tablet 0   liraglutide (VICTOZA) 18 MG/3ML SOPN Inject 1.8 mg into the skin daily. 27 mL 3   nitroGLYCERIN (NITROSTAT) 0.4 MG SL tablet Place 1 tablet (0.4 mg total) under the tongue every 5 (five) minutes as needed for up to 30 doses for chest pain. 30 tablet 0   Calcium Carbonate Antacid (TUMS PO) Take 2 tablets by mouth 3 (three) times daily with meals. (Patient not taking: Reported on 01/14/2023)     Misc. Devices MISC Please provide patient with insurance approved CPAP with the following settings:  autopap 15-20.  Large size Fisher[employer]kel Full Face Mask Forma mask and heated humidification. (Patient not taking: Reported on 01/14/2023) 1 each 0   oxyCODONE (ROXICODONE) 5 MG immediate release tablet Take 1 tablet (5 mg total) by mouth every 4 (four) hours as needed for severe pain. (Patient not taking: Reported on 12/05/2022) 2 tablet 0   No current facility-administered medications on file prior to visit.    Allergies  Allergen Reactions   Aspirin Hives   Nitroglycerin Other (See Comments)    Nitroglycerin paste or IV will make blood pressure go up and cause headaches. The nitroglycerin tablets do fine.    Social History   Socioeconomic History   Marital status: Married    Spouse name: Not on file   Number of children: 7   Years of education: Not on file   Highest education level: Not on file  Occupational History   Occupation: disabled  Tobacco Use   Smoking status: Every Day    Current packs/day: 0.50    Average packs/day: 0.5 packs/day for 40.0 years (20.0 ttl pk-yrs)    Types: Cigarettes   Smokeless tobacco: Never   Tobacco comments:    Started smoking again in April.  1/2 ppd.     Vaping Use   Vaping status: Never Used  Substance and Sexual Activity   Alcohol use: Not Currently    Comment: very little   Drug use: Not Currently    Comment: "years ago" per patient   Sexual activity: Yes  Other Topics Concern   Not on file  Social History Narrative   Not on file   Social Determinants of Health   Financial Resource Strain: Not on file  Food Insecurity: Not on file  Transportation Needs: Not on file  Physical Activity: Not on file  Stress: Not on file  Social Connections: Unknown (09/13/2021)   Received from [facility], [facility]   Social Network    Social Network: Not on file  Intimate Partner Violence: Unknown (08/06/2021)   Received from [facility], [facility]   HITS    Physically Hurt: Not on file    Insult or Talk Down To: Not on file  Threaten Physical Harm: Not on file    Scream or Curse: Not on file    Family History  Problem Relation Age of Onset   Heart attack Mother    Hypertension Mother    Diabetes Mother    Heart disease Sister    Hypertension Sister    Hypertension Father    Emphysema Father     Past Surgical History:  Procedure Laterality Date   AV FISTULA PLACEMENT Left 07/17/2021   Procedure: CREATION  OF LEFT ARM RADIOCEPHALIC ARTERIOVENOUS (AV) FISTULA;  Surgeon: Nada Libman, MD;  Location: [facility] OR;  Service: Vascular;  Laterality: Left;   CARDIAC CATHETERIZATION     IR FLUORO GUIDE CV LINE RIGHT  05/20/2021   IR US GUIDE VASC ACCESS RIGHT  05/20/2021   LIGATION OF COMPETING BRANCHES OF ARTERIOVENOUS FISTULA Left 09/13/2021   Procedure: LIGATION AND ELEVATION OF COMPETING BRANCHES OF LEFT ARM ARTERIOVENOUS FISTULA;  Surgeon: Nada Libman, MD;  Location: [facility] OR;  Service: Vascular;  Laterality: Left;    ROS: Review of Systems Negative except as stated above  PHYSICAL EXAM: BP (!) 194/118   Pulse 90   Temp 97.8 F (36.6 C) (Oral)   Ht 5\' 11"  (1.803 m)   Wt 271 lb 12.8 oz (123.3 kg)   SpO2 94%    BMI 37.91 kg/m   Physical Exam HENT:     Head: Normocephalic and atraumatic.     Nose: Nose normal.     Mouth/Throat:     Mouth: Mucous membranes are moist.     Pharynx: Oropharynx is clear.  Eyes:     Extraocular Movements: Extraocular movements intact.     Conjunctiva/sclera: Conjunctivae normal.     Pupils: Pupils are equal, round, and reactive to light.  Cardiovascular:     Rate and Rhythm: Normal rate and regular rhythm.     Pulses: Normal pulses.     Heart sounds: Normal heart sounds.  Pulmonary:     Effort: Pulmonary effort is normal.     Breath sounds: Normal breath sounds.  Musculoskeletal:        General: Normal range of motion.     Cervical back: Normal range of motion and neck supple.  Neurological:     General: No focal deficit present.     Mental Status: He is alert and oriented to person, place, and time.  Psychiatric:        Mood and Affect: Mood normal.        Behavior: Behavior normal.     ASSESSMENT AND PLAN: 1. Colon cancer screening - Routine screening.  - Ambulatory referral to Gastroenterology  2. Type 2 diabetes mellitus with hyperglycemia, with long-term current use of insulin (HCC) - Continue present management. - Routine screening.  - Discussed the importance of healthy eating habits, low-carbohydrate diet, low-sugar diet, regular aerobic exercise (at least 150 minutes a week as tolerated) and medication compliance to achieve or maintain control of diabetes. - Follow-up with primary provider as scheduled.  - Hemoglobin A1c  3. Generalized anxiety disorder - Patient denies thoughts of self-harm, suicidal ideations, homicidal ideations. - Continue Sertraline as prescribed.  - Referral to Psychiatry for evaluation/management. During the interim follow-up with primary provider as scheduled until established with referral. - sertraline (ZOLOFT) 25 MG tablet; Take 1 tablet (25 mg total) by mouth daily.  Dispense: 30 tablet; Refill: 2 - Ambulatory  referral to Psychiatry  4. Chronic obstructive pulmonary disease, unspecified COPD type (HCC) - Continue Budesonide-Formoterol and  Albuterol as prescribed. Counseled on medication adherence/adverse effects.  - Follow-up with primary provider as scheduled. - budesonide-formoterol (SYMBICORT) 160-4.5 MCG/ACT inhaler; Inhale 2 puffs into the lungs 2 (two) times daily.  Dispense: 10.2 g; Refill: 2 - albuterol (PROVENTIL) (2.5 MG/3ML) 0.083% nebulizer solution; Take 3 mLs (2.5 mg total) by nebulization every 6 (six) hours as needed for wheezing or shortness of breath.  Dispense: 150 mL; Refill: 3  5. Encounter for smoking cessation counseling - Continue Nicotine patch as prescribed. Counseled on medication adherence/adverse effects.  - Follow-up with primary provider as scheduled.  - nicotine (NICODERM CQ - DOSED IN MG/24 HOURS) 14 mg/24hr patch; Place 1 patch (14 mg total) onto the skin daily as needed.  Dispense: 28 patch; Refill: 2  6. Thyroid disorder screen - Routine screening.  - TSH  7. Immunization due - Administered.  - Tdap vaccine greater than or equal to 7yo IM    Patient was given the opportunity to ask questions.  Patient verbalized understanding of the plan and was able to repeat key elements of the plan. Patient was given clear instructions to go to Emergency Department or return to medical center if symptoms don't improve, worsen, or new problems develop.The patient verbalized understanding.   Orders Placed This Encounter  Procedures   Tdap vaccine greater than or equal to 7yo IM   Hemoglobin A1c   TSH   Ambulatory referral to Gastroenterology   Ambulatory referral to Psychiatry     Requested Prescriptions   Signed Prescriptions Disp Refills   sertraline (ZOLOFT) 25 MG tablet 30 tablet 2    Sig: Take 1 tablet (25 mg total) by mouth daily.   nicotine (NICODERM CQ - DOSED IN MG/24 HOURS) 14 mg/24hr patch 28 patch 2    Sig: Place 1 patch (14 mg total) onto the skin  daily as needed.   budesonide-formoterol (SYMBICORT) 160-4.5 MCG/ACT inhaler 10.2 g 2    Sig: Inhale 2 puffs into the lungs 2 (two) times daily.   albuterol (PROVENTIL) (2.5 MG/3ML) 0.083% nebulizer solution 150 mL 3    Sig: Take 3 mLs (2.5 mg total) by nebulization every 6 (six) hours as needed for wheezing or shortness of breath.    No follow-ups on file.  Rema Fendt, NP

## 2023-01-14 NOTE — Progress Notes (Signed)
Patient asking for colonoscopy doing to being on a list for a kidney.   Pt needs pneumonia and tetanus shot.    Pt needs prescription of Symbicort inhaler.  Patient asking you to take over Symbicort, Nicoderm, and Zoloft.

## 2023-01-15 LAB — HEMOGLOBIN A1C
Est. average glucose Bld gHb Est-mCnc: 203 mg/dL
Hgb A1c MFr Bld: 8.7 % — ABNORMAL HIGH (ref 4.8–5.6)

## 2023-01-15 LAB — TSH: TSH: 1.18 u[IU]/mL (ref 0.450–4.500)

## 2023-01-17 DIAGNOSIS — N186 End stage renal disease: Secondary | ICD-10-CM | POA: Diagnosis not present

## 2023-01-17 DIAGNOSIS — N2581 Secondary hyperparathyroidism of renal origin: Secondary | ICD-10-CM | POA: Diagnosis not present

## 2023-01-17 DIAGNOSIS — Z992 Dependence on renal dialysis: Secondary | ICD-10-CM | POA: Diagnosis not present

## 2023-01-18 ENCOUNTER — Other Ambulatory Visit: Payer: Self-pay

## 2023-01-18 ENCOUNTER — Emergency Department (HOSPITAL_BASED_OUTPATIENT_CLINIC_OR_DEPARTMENT_OTHER)
Admission: EM | Admit: 2023-01-18 | Discharge: 2023-01-18 | Disposition: A | Discharge status: Home / Self Care | Payer: Medicare HMO | Attending: Emergency Medicine | Admitting: Emergency Medicine

## 2023-01-18 DIAGNOSIS — Z794 Long term (current) use of insulin: Secondary | ICD-10-CM | POA: Insufficient documentation

## 2023-01-18 DIAGNOSIS — N186 End stage renal disease: Secondary | ICD-10-CM | POA: Diagnosis not present

## 2023-01-18 DIAGNOSIS — M79644 Pain in right finger(s): Secondary | ICD-10-CM | POA: Diagnosis present

## 2023-01-18 DIAGNOSIS — Z992 Dependence on renal dialysis: Secondary | ICD-10-CM | POA: Diagnosis not present

## 2023-01-18 DIAGNOSIS — E1122 Type 2 diabetes mellitus with diabetic chronic kidney disease: Secondary | ICD-10-CM | POA: Insufficient documentation

## 2023-01-18 DIAGNOSIS — I120 Hypertensive chronic kidney disease with stage 5 chronic kidney disease or end stage renal disease: Secondary | ICD-10-CM | POA: Diagnosis not present

## 2023-01-18 DIAGNOSIS — L03011 Cellulitis of right finger: Secondary | ICD-10-CM | POA: Diagnosis not present

## 2023-01-18 DIAGNOSIS — Z79899 Other long term (current) drug therapy: Secondary | ICD-10-CM | POA: Diagnosis not present

## 2023-01-18 MED ORDER — OXYCODONE-ACETAMINOPHEN 5-325 MG PO TABS
1.0000 | ORAL_TABLET | Freq: Once | ORAL | Status: AC
  Administered 2023-01-18: 1 via ORAL
  Filled 2023-01-18: qty 1

## 2023-01-18 MED ORDER — LIDOCAINE HCL 2 % IJ SOLN
10.0000 mL | Freq: Once | INTRAMUSCULAR | Status: AC
  Administered 2023-01-18: 200 mg via INTRADERMAL

## 2023-01-18 MED ORDER — OXYCODONE-ACETAMINOPHEN 5-325 MG PO TABS
1.0000 | ORAL_TABLET | Freq: Four times a day (QID) | ORAL | 0 refills | Status: DC | PRN
Start: 2023-01-18 — End: 2023-06-18

## 2023-01-18 NOTE — ED Triage Notes (Signed)
Pt POV from UC, seen for possible infected nail. UC provider cut R ring finger to drain, still actively bleeding. Told to come to ER. Diabetic.

## 2023-01-18 NOTE — ED Provider Notes (Signed)
EMERGENCY DEPARTMENT AT [facility] Provider Note   CSN: [id number] Arrival date & time: 01/18/23  2043     History  Chief Complaint  Patient presents with   Laceration    Matthew Odom is a 52 y.o. male.  Patient is a 52 year old male with a history of hypertension, diabetes, end-stage renal disease on dialysis Tuesday Thursday Saturday who is presenting today due to severe pain in his right ring finger.  Patient reports that about 2 days ago he started having pain along the lateral portion of that nail and then today it was swollen and more tender and he was concerned there may be an infection.  He initially went to urgent care and they told him he had a felon.  They anesthetized the area and cut the finger pad he reports it bled but no pus came out.  They told him to soak the finger and sent him a prescription for clindamycin.  He went to pick up the prescription but it was not ready and the numbing medicine wore off and his finger started hurting badly and bleeding more profusely.  He then went back to the urgent care and he reports they were already closed but he knocked on the door until someone came and told them what was going on but he said at that time they told him they could not help him or give him pain medication and he would need to come to the emergency room.  He reports he still having pain in the same area of his finger that he went in for but now he is also having a lot of bleeding and pain where they I&D his finger.  The history is provided by the patient, the spouse and medical records.  Laceration      Home Medications Prior to Admission medications   Medication Sig Start Date End Date Taking? Authorizing Provider  oxyCODONE-acetaminophen (PERCOCET/ROXICET) 5-325 MG tablet Take 1 tablet by mouth every 6 (six) hours as needed for severe pain. 01/18/23  Yes Gwyneth Sprout, MD  acetaminophen (TYLENOL) 500 MG tablet Take 500-1,000 mg by mouth every  6 (six) hours as needed (pain.).    [provider]  albuterol (PROVENTIL) (2.5 MG/3ML) 0.083% nebulizer solution Take 3 mLs (2.5 mg total) by nebulization every 6 (six) hours as needed for wheezing or shortness of breath. 01/14/23   Rema Fendt, NP  amLODipine (NORVASC) 10 MG tablet Take 1 tablet (10 mg total) by mouth daily. 12/05/22   Sande Rives, MD  atorvastatin (LIPITOR) 80 MG tablet Take 1 tablet (80 mg total) by mouth daily. 11/13/21 01/14/23  Kathlen Mody, MD  budesonide-formoterol (SYMBICORT) 160-4.5 MCG/ACT inhaler Inhale 2 puffs into the lungs 2 (two) times daily. 01/14/23   Rema Fendt, NP  calcium acetate (PHOSLO) 667 MG capsule Take 2 capsules (1,334 mg total) by mouth 3 (three) times daily with meals 06/25/21     Calcium Carbonate Antacid (TUMS PO) Take 2 tablets by mouth 3 (three) times daily with meals. Patient not taking: Reported on 01/14/2023    [provider]  carvedilol (COREG) 25 MG tablet Take 1 tablet (25 mg total) by mouth 2 (two) times daily with a meal. 12/05/22   O'Neal, Ronnald Ramp, MD  doxazosin (CARDURA) 8 MG tablet Take 1 tablet (8 mg total) by mouth daily. 12/05/22   O'NealRonnald Ramp, MD  hydrALAZINE (APRESOLINE) 100 MG tablet Take 1 tablet (100 mg total) by mouth 3 (three)  times daily. 12/05/22   O'NealRonnald Ramp, MD  Insulin Glargine [facility]) 100 UNIT/ML Inject 20 Units into the skin daily. Patient taking differently: Inject 10 Units into the skin 2 (two) times daily. 06/19/21   Shamleffer, Konrad Dolores, MD  Insulin Pen Needle 31G X 6 MM MISC Use as directed in the morning, at noon, in the evening, and at bedtime. 06/19/21   Shamleffer, Konrad Dolores, MD  ketorolac (TORADOL) 10 MG tablet Take 1 tablet (10 mg total) by mouth every 6 (six) hours as needed. 08/20/22   Small, Brooke L, PA  liraglutide (VICTOZA) 18 MG/3ML SOPN Inject 1.8 mg into the skin daily. 06/19/21   Shamleffer, Konrad Dolores, MD  Misc. Devices MISC  Please provide patient with insurance approved CPAP with the following settings:  autopap 15-20.  Large size [employer] Full Face Mask Forma mask and heated humidification. Patient not taking: Reported on 01/14/2023 12/25/19   Claiborne Rigg, NP  nicotine (NICODERM CQ - DOSED IN MG/24 HOURS) 14 mg/24hr patch Place 1 patch (14 mg total) onto the skin daily as needed. 01/14/23   Rema Fendt, NP  nitroGLYCERIN (NITROSTAT) 0.4 MG SL tablet Place 1 tablet (0.4 mg total) under the tongue every 5 (five) minutes as needed for up to 30 doses for chest pain. 07/16/21   Terald Sleeper, MD  sertraline (ZOLOFT) 25 MG tablet Take 1 tablet (25 mg total) by mouth daily. 01/14/23   Rema Fendt, NP      Allergies    Aspirin and Nitroglycerin    Review of Systems   Review of Systems  Physical Exam Updated Vital Signs BP (!) 191/135   Pulse 99   Resp 20   SpO2 93%  Physical Exam Vitals and nursing note reviewed.  HENT:     Head: Normocephalic.  Cardiovascular:     Pulses: Normal pulses.  Pulmonary:     Effort: Pulmonary effort is normal.  Musculoskeletal:       Hands:     Comments: Dialysis graft present in the left arm with palpable thrill  Neurological:     Mental Status: He is alert.     ED Results / Procedures / Treatments   Labs (all labs ordered are listed, but only abnormal results are displayed) Labs Reviewed - No data to display  EKG None  Radiology No results found.  Procedures Procedures   INCISION AND DRAINAGE Performed by: Gwyneth Sprout Consent: Verbal consent obtained. Risks and benefits: risks, benefits and alternatives were discussed Type: abscess  Body area: right ring finger  Anesthesia: digital block  Incision was made with a scalpel.  Local anesthetic: lidocaine 1% without epinephrine  Anesthetic total: 3 ml  Complexity: simple  Drainage: purulent  Drainage amount: scant  Packing material: none Patient tolerance: Patient  tolerated the procedure well with no immediate complications. LACERATION REPAIR Performed by: [facility] Authorized by: Gwyneth Sprout Consent: Verbal consent obtained. Risks and benefits: risks, benefits and alternatives were discussed Consent given by: patient Patient identity confirmed: provided demographic data Prepped and Draped in normal sterile fashion Wound explored  Laceration Location: right ring finger   Laceration Length: 1.5cm  No Foreign Bodies seen or palpated  Anesthesia: digital block Local anesthetic: lidocaine 1% without epinephrine  Anesthetic total: 3 ml  Irrigation method: syringe Amount of cleaning: standard  Skin closure: 5.0 vicryl rapide  Number of sutures: 2  Technique: simple interrupted  Patient tolerance: Patient tolerated the procedure well with no immediate complications.  Medications Ordered in ED Medications  oxyCODONE-acetaminophen (PERCOCET/ROXICET) 5-325 MG per tablet 1 tablet (1 tablet Oral Given 01/18/23 2141)  lidocaine (XYLOCAINE) 2 % (with pres) injection 200 mg (200 mg Intradermal Given 01/18/23 2147)    ED Course/ Medical Decision Making/ A&P                                 Medical Decision Making Risk Prescription drug management.   Patient presenting today from urgent care with severe finger pain after he had an I&D for a presumed felon.  Patient actually has a paronychia and is now having significant pain from where he was I&D but is still having pain where he had the paronychia.  The paronychia was drained by using an 11 blade scalpel along the paronychial fold of the finger with some pus drained.  Unfortunately with the felon I&D and artery was cut and he had persistent arterial bleeding here.  Because there does not appear to be a felon or infection of his finger pad 2 sutures were placed to achieve hemostasis.  Patient has a prescription for antibiotics was given a short course of pain medication.  Given  instructions on how to care for the paronychia and hand follow-up if he has any issues.  Patient was noted to be hypertensive here but has not taken his blood pressure medication this evening.  He is not complaining of any chest pain or shortness of breath.  Last dialysis was on Saturday when he is due again for dialysis on Tuesday.  He is planning on taking his blood pressure medication when he gets home.  He did have an x-ray done at urgent care that showed no acute findings.        Final Clinical Impression(s) / ED Diagnoses Final diagnoses:  Paronychia of finger of right hand    Rx / DC Orders ED Discharge Orders          Ordered    oxyCODONE-acetaminophen (PERCOCET/ROXICET) 5-325 MG tablet  Every 6 hours PRN        01/18/23 2212              Gwyneth Sprout, MD 01/18/23 2258

## 2023-01-18 NOTE — Discharge Instructions (Signed)
When you are at urgent care and they attempted to drain what was perceived to be a felon they did knick and artery in your finger which is what caused the persistent bleeding.  Here we did drain the paronychia in the space along the side of your fingernail was opened and we got some pus.  You will need to do warm compresses over the area but I do not want you to soak your finger because we had to sew up the area that was bleeding.  Those stitches should dissolve and be able to be pulled out in about a week's time.  You can get your pain medication and antibiotic at your pharmacy.  It will be sore tonight.  Elevating it may help with the pulsating.  If it starts getting worse follow-up with the hand doctor.

## 2023-01-19 ENCOUNTER — Other Ambulatory Visit: Payer: Self-pay

## 2023-01-20 DIAGNOSIS — N186 End stage renal disease: Secondary | ICD-10-CM | POA: Diagnosis not present

## 2023-01-20 DIAGNOSIS — Z992 Dependence on renal dialysis: Secondary | ICD-10-CM | POA: Diagnosis not present

## 2023-01-20 DIAGNOSIS — N2581 Secondary hyperparathyroidism of renal origin: Secondary | ICD-10-CM | POA: Diagnosis not present

## 2023-01-22 DIAGNOSIS — N2581 Secondary hyperparathyroidism of renal origin: Secondary | ICD-10-CM | POA: Diagnosis not present

## 2023-01-22 DIAGNOSIS — N186 End stage renal disease: Secondary | ICD-10-CM | POA: Diagnosis not present

## 2023-01-22 DIAGNOSIS — Z992 Dependence on renal dialysis: Secondary | ICD-10-CM | POA: Diagnosis not present

## 2023-01-24 DIAGNOSIS — N2581 Secondary hyperparathyroidism of renal origin: Secondary | ICD-10-CM | POA: Diagnosis not present

## 2023-01-24 DIAGNOSIS — N186 End stage renal disease: Secondary | ICD-10-CM | POA: Diagnosis not present

## 2023-01-24 DIAGNOSIS — Z992 Dependence on renal dialysis: Secondary | ICD-10-CM | POA: Diagnosis not present

## 2023-01-27 DIAGNOSIS — N186 End stage renal disease: Secondary | ICD-10-CM | POA: Diagnosis not present

## 2023-01-27 DIAGNOSIS — Z992 Dependence on renal dialysis: Secondary | ICD-10-CM | POA: Diagnosis not present

## 2023-01-27 DIAGNOSIS — N2581 Secondary hyperparathyroidism of renal origin: Secondary | ICD-10-CM | POA: Diagnosis not present

## 2023-01-29 DIAGNOSIS — N186 End stage renal disease: Secondary | ICD-10-CM | POA: Diagnosis not present

## 2023-01-29 DIAGNOSIS — N2581 Secondary hyperparathyroidism of renal origin: Secondary | ICD-10-CM | POA: Diagnosis not present

## 2023-01-29 DIAGNOSIS — Z992 Dependence on renal dialysis: Secondary | ICD-10-CM | POA: Diagnosis not present

## 2023-01-31 DIAGNOSIS — Z992 Dependence on renal dialysis: Secondary | ICD-10-CM | POA: Diagnosis not present

## 2023-01-31 DIAGNOSIS — N186 End stage renal disease: Secondary | ICD-10-CM | POA: Diagnosis not present

## 2023-01-31 DIAGNOSIS — N2581 Secondary hyperparathyroidism of renal origin: Secondary | ICD-10-CM | POA: Diagnosis not present

## 2023-02-02 DIAGNOSIS — N186 End stage renal disease: Secondary | ICD-10-CM | POA: Diagnosis not present

## 2023-02-02 DIAGNOSIS — E1129 Type 2 diabetes mellitus with other diabetic kidney complication: Secondary | ICD-10-CM | POA: Diagnosis not present

## 2023-02-02 DIAGNOSIS — Z992 Dependence on renal dialysis: Secondary | ICD-10-CM | POA: Diagnosis not present

## 2023-02-03 DIAGNOSIS — N2581 Secondary hyperparathyroidism of renal origin: Secondary | ICD-10-CM | POA: Diagnosis not present

## 2023-02-03 DIAGNOSIS — N186 End stage renal disease: Secondary | ICD-10-CM | POA: Diagnosis not present

## 2023-02-03 DIAGNOSIS — Z992 Dependence on renal dialysis: Secondary | ICD-10-CM | POA: Diagnosis not present

## 2023-02-06 ENCOUNTER — Other Ambulatory Visit: Payer: Self-pay | Admitting: Family

## 2023-02-06 DIAGNOSIS — Z1212 Encounter for screening for malignant neoplasm of rectum: Secondary | ICD-10-CM

## 2023-02-06 DIAGNOSIS — Z1211 Encounter for screening for malignant neoplasm of colon: Secondary | ICD-10-CM

## 2023-02-07 DIAGNOSIS — N2581 Secondary hyperparathyroidism of renal origin: Secondary | ICD-10-CM | POA: Diagnosis not present

## 2023-02-07 DIAGNOSIS — Z992 Dependence on renal dialysis: Secondary | ICD-10-CM | POA: Diagnosis not present

## 2023-02-07 DIAGNOSIS — N186 End stage renal disease: Secondary | ICD-10-CM | POA: Diagnosis not present

## 2023-02-14 DIAGNOSIS — Z992 Dependence on renal dialysis: Secondary | ICD-10-CM | POA: Diagnosis not present

## 2023-02-14 DIAGNOSIS — N186 End stage renal disease: Secondary | ICD-10-CM | POA: Diagnosis not present

## 2023-02-14 DIAGNOSIS — N2581 Secondary hyperparathyroidism of renal origin: Secondary | ICD-10-CM | POA: Diagnosis not present

## 2023-02-17 DIAGNOSIS — N2581 Secondary hyperparathyroidism of renal origin: Secondary | ICD-10-CM | POA: Diagnosis not present

## 2023-02-17 DIAGNOSIS — N186 End stage renal disease: Secondary | ICD-10-CM | POA: Diagnosis not present

## 2023-02-17 DIAGNOSIS — Z992 Dependence on renal dialysis: Secondary | ICD-10-CM | POA: Diagnosis not present

## 2023-02-18 ENCOUNTER — Telehealth: Payer: Self-pay

## 2023-02-18 NOTE — Telephone Encounter (Signed)
Transition Care Management Unsuccessful Follow-up Telephone Call  Date of discharge and from where:  [facility] 9/15  Attempts:  1st Attempt  Reason for unsuccessful TCM follow-up call:  No answer/busy   Lenard Forth [place]  [facility], [facility] Guide, Phone: [phone] Website: [email or web address]

## 2023-02-20 ENCOUNTER — Telehealth: Payer: Self-pay | Admitting: Family

## 2023-02-20 ENCOUNTER — Telehealth: Payer: Self-pay

## 2023-02-20 NOTE — Telephone Encounter (Signed)
Transition Care Management Unsuccessful Follow-up Telephone Call  Date of discharge and from where:  [facility] 9/15  Attempts:  2nd Attempt  Reason for unsuccessful TCM follow-up call:  No answer/busy   Lenard Forth [place]  [facility], [facility] Guide, Phone: [phone] Website: [email or web address]

## 2023-02-21 DIAGNOSIS — N2581 Secondary hyperparathyroidism of renal origin: Secondary | ICD-10-CM | POA: Diagnosis not present

## 2023-02-21 DIAGNOSIS — N186 End stage renal disease: Secondary | ICD-10-CM | POA: Diagnosis not present

## 2023-02-21 DIAGNOSIS — Z992 Dependence on renal dialysis: Secondary | ICD-10-CM | POA: Diagnosis not present

## 2023-02-24 DIAGNOSIS — N186 End stage renal disease: Secondary | ICD-10-CM | POA: Diagnosis not present

## 2023-02-24 DIAGNOSIS — Z992 Dependence on renal dialysis: Secondary | ICD-10-CM | POA: Diagnosis not present

## 2023-02-24 DIAGNOSIS — N2581 Secondary hyperparathyroidism of renal origin: Secondary | ICD-10-CM | POA: Diagnosis not present

## 2023-02-26 DIAGNOSIS — Z992 Dependence on renal dialysis: Secondary | ICD-10-CM | POA: Diagnosis not present

## 2023-02-26 DIAGNOSIS — N186 End stage renal disease: Secondary | ICD-10-CM | POA: Diagnosis not present

## 2023-02-26 DIAGNOSIS — N2581 Secondary hyperparathyroidism of renal origin: Secondary | ICD-10-CM | POA: Diagnosis not present

## 2023-02-28 DIAGNOSIS — Z992 Dependence on renal dialysis: Secondary | ICD-10-CM | POA: Diagnosis not present

## 2023-02-28 DIAGNOSIS — N2581 Secondary hyperparathyroidism of renal origin: Secondary | ICD-10-CM | POA: Diagnosis not present

## 2023-02-28 DIAGNOSIS — N186 End stage renal disease: Secondary | ICD-10-CM | POA: Diagnosis not present

## 2023-03-03 DIAGNOSIS — N2581 Secondary hyperparathyroidism of renal origin: Secondary | ICD-10-CM | POA: Diagnosis not present

## 2023-03-03 DIAGNOSIS — N186 End stage renal disease: Secondary | ICD-10-CM | POA: Diagnosis not present

## 2023-03-03 DIAGNOSIS — Z992 Dependence on renal dialysis: Secondary | ICD-10-CM | POA: Diagnosis not present

## 2023-03-05 DIAGNOSIS — E1129 Type 2 diabetes mellitus with other diabetic kidney complication: Secondary | ICD-10-CM | POA: Diagnosis not present

## 2023-03-05 DIAGNOSIS — N186 End stage renal disease: Secondary | ICD-10-CM | POA: Diagnosis not present

## 2023-03-05 DIAGNOSIS — N2581 Secondary hyperparathyroidism of renal origin: Secondary | ICD-10-CM | POA: Diagnosis not present

## 2023-03-05 DIAGNOSIS — Z992 Dependence on renal dialysis: Secondary | ICD-10-CM | POA: Diagnosis not present

## 2023-03-07 DIAGNOSIS — Z992 Dependence on renal dialysis: Secondary | ICD-10-CM | POA: Diagnosis not present

## 2023-03-07 DIAGNOSIS — N186 End stage renal disease: Secondary | ICD-10-CM | POA: Diagnosis not present

## 2023-03-07 DIAGNOSIS — N2581 Secondary hyperparathyroidism of renal origin: Secondary | ICD-10-CM | POA: Diagnosis not present

## 2023-03-12 DIAGNOSIS — N2581 Secondary hyperparathyroidism of renal origin: Secondary | ICD-10-CM | POA: Diagnosis not present

## 2023-03-12 DIAGNOSIS — Z992 Dependence on renal dialysis: Secondary | ICD-10-CM | POA: Diagnosis not present

## 2023-03-12 DIAGNOSIS — N186 End stage renal disease: Secondary | ICD-10-CM | POA: Diagnosis not present

## 2023-03-14 DIAGNOSIS — N2581 Secondary hyperparathyroidism of renal origin: Secondary | ICD-10-CM | POA: Diagnosis not present

## 2023-03-14 DIAGNOSIS — Z992 Dependence on renal dialysis: Secondary | ICD-10-CM | POA: Diagnosis not present

## 2023-03-14 DIAGNOSIS — N186 End stage renal disease: Secondary | ICD-10-CM | POA: Diagnosis not present

## 2023-03-17 DIAGNOSIS — N2581 Secondary hyperparathyroidism of renal origin: Secondary | ICD-10-CM | POA: Diagnosis not present

## 2023-03-17 DIAGNOSIS — Z992 Dependence on renal dialysis: Secondary | ICD-10-CM | POA: Diagnosis not present

## 2023-03-17 DIAGNOSIS — N186 End stage renal disease: Secondary | ICD-10-CM | POA: Diagnosis not present

## 2023-03-19 DIAGNOSIS — Z992 Dependence on renal dialysis: Secondary | ICD-10-CM | POA: Diagnosis not present

## 2023-03-19 DIAGNOSIS — N2581 Secondary hyperparathyroidism of renal origin: Secondary | ICD-10-CM | POA: Diagnosis not present

## 2023-03-19 DIAGNOSIS — N186 End stage renal disease: Secondary | ICD-10-CM | POA: Diagnosis not present

## 2023-03-21 DIAGNOSIS — N2581 Secondary hyperparathyroidism of renal origin: Secondary | ICD-10-CM | POA: Diagnosis not present

## 2023-03-21 DIAGNOSIS — N186 End stage renal disease: Secondary | ICD-10-CM | POA: Diagnosis not present

## 2023-03-21 DIAGNOSIS — Z992 Dependence on renal dialysis: Secondary | ICD-10-CM | POA: Diagnosis not present

## 2023-03-26 DIAGNOSIS — N186 End stage renal disease: Secondary | ICD-10-CM | POA: Diagnosis not present

## 2023-03-26 DIAGNOSIS — Z992 Dependence on renal dialysis: Secondary | ICD-10-CM | POA: Diagnosis not present

## 2023-03-26 DIAGNOSIS — N2581 Secondary hyperparathyroidism of renal origin: Secondary | ICD-10-CM | POA: Diagnosis not present

## 2023-04-01 DIAGNOSIS — N2581 Secondary hyperparathyroidism of renal origin: Secondary | ICD-10-CM | POA: Diagnosis not present

## 2023-04-01 DIAGNOSIS — N186 End stage renal disease: Secondary | ICD-10-CM | POA: Diagnosis not present

## 2023-04-01 DIAGNOSIS — Z992 Dependence on renal dialysis: Secondary | ICD-10-CM | POA: Diagnosis not present

## 2023-04-04 DIAGNOSIS — N186 End stage renal disease: Secondary | ICD-10-CM | POA: Diagnosis not present

## 2023-04-04 DIAGNOSIS — E1129 Type 2 diabetes mellitus with other diabetic kidney complication: Secondary | ICD-10-CM | POA: Diagnosis not present

## 2023-04-04 DIAGNOSIS — N2581 Secondary hyperparathyroidism of renal origin: Secondary | ICD-10-CM | POA: Diagnosis not present

## 2023-04-04 DIAGNOSIS — Z992 Dependence on renal dialysis: Secondary | ICD-10-CM | POA: Diagnosis not present

## 2023-04-07 DIAGNOSIS — N2581 Secondary hyperparathyroidism of renal origin: Secondary | ICD-10-CM | POA: Diagnosis not present

## 2023-04-07 DIAGNOSIS — N186 End stage renal disease: Secondary | ICD-10-CM | POA: Diagnosis not present

## 2023-04-07 DIAGNOSIS — Z992 Dependence on renal dialysis: Secondary | ICD-10-CM | POA: Diagnosis not present

## 2023-04-09 DIAGNOSIS — Z992 Dependence on renal dialysis: Secondary | ICD-10-CM | POA: Diagnosis not present

## 2023-04-09 DIAGNOSIS — N2581 Secondary hyperparathyroidism of renal origin: Secondary | ICD-10-CM | POA: Diagnosis not present

## 2023-04-09 DIAGNOSIS — N186 End stage renal disease: Secondary | ICD-10-CM | POA: Diagnosis not present

## 2023-04-16 DIAGNOSIS — N2581 Secondary hyperparathyroidism of renal origin: Secondary | ICD-10-CM | POA: Diagnosis not present

## 2023-04-16 DIAGNOSIS — Z992 Dependence on renal dialysis: Secondary | ICD-10-CM | POA: Diagnosis not present

## 2023-04-16 DIAGNOSIS — N186 End stage renal disease: Secondary | ICD-10-CM | POA: Diagnosis not present

## 2023-04-18 DIAGNOSIS — N2581 Secondary hyperparathyroidism of renal origin: Secondary | ICD-10-CM | POA: Diagnosis not present

## 2023-04-18 DIAGNOSIS — N186 End stage renal disease: Secondary | ICD-10-CM | POA: Diagnosis not present

## 2023-04-18 DIAGNOSIS — Z992 Dependence on renal dialysis: Secondary | ICD-10-CM | POA: Diagnosis not present

## 2023-04-21 DIAGNOSIS — N2581 Secondary hyperparathyroidism of renal origin: Secondary | ICD-10-CM | POA: Diagnosis not present

## 2023-04-21 DIAGNOSIS — Z992 Dependence on renal dialysis: Secondary | ICD-10-CM | POA: Diagnosis not present

## 2023-04-21 DIAGNOSIS — N186 End stage renal disease: Secondary | ICD-10-CM | POA: Diagnosis not present

## 2023-04-25 DIAGNOSIS — Z992 Dependence on renal dialysis: Secondary | ICD-10-CM | POA: Diagnosis not present

## 2023-04-25 DIAGNOSIS — N186 End stage renal disease: Secondary | ICD-10-CM | POA: Diagnosis not present

## 2023-04-25 DIAGNOSIS — N2581 Secondary hyperparathyroidism of renal origin: Secondary | ICD-10-CM | POA: Diagnosis not present

## 2023-04-27 DIAGNOSIS — N186 End stage renal disease: Secondary | ICD-10-CM | POA: Diagnosis not present

## 2023-04-27 DIAGNOSIS — N2581 Secondary hyperparathyroidism of renal origin: Secondary | ICD-10-CM | POA: Diagnosis not present

## 2023-04-27 DIAGNOSIS — Z992 Dependence on renal dialysis: Secondary | ICD-10-CM | POA: Diagnosis not present

## 2023-04-30 DIAGNOSIS — N2581 Secondary hyperparathyroidism of renal origin: Secondary | ICD-10-CM | POA: Diagnosis not present

## 2023-04-30 DIAGNOSIS — N186 End stage renal disease: Secondary | ICD-10-CM | POA: Diagnosis not present

## 2023-04-30 DIAGNOSIS — Z992 Dependence on renal dialysis: Secondary | ICD-10-CM | POA: Diagnosis not present

## 2023-05-04 DIAGNOSIS — Z992 Dependence on renal dialysis: Secondary | ICD-10-CM | POA: Diagnosis not present

## 2023-05-04 DIAGNOSIS — N186 End stage renal disease: Secondary | ICD-10-CM | POA: Diagnosis not present

## 2023-05-04 DIAGNOSIS — N2581 Secondary hyperparathyroidism of renal origin: Secondary | ICD-10-CM | POA: Diagnosis not present

## 2023-05-05 DIAGNOSIS — E1129 Type 2 diabetes mellitus with other diabetic kidney complication: Secondary | ICD-10-CM | POA: Diagnosis not present

## 2023-05-05 DIAGNOSIS — Z992 Dependence on renal dialysis: Secondary | ICD-10-CM | POA: Diagnosis not present

## 2023-05-05 DIAGNOSIS — N186 End stage renal disease: Secondary | ICD-10-CM | POA: Diagnosis not present

## 2023-05-07 DIAGNOSIS — Z992 Dependence on renal dialysis: Secondary | ICD-10-CM | POA: Diagnosis not present

## 2023-05-07 DIAGNOSIS — N2581 Secondary hyperparathyroidism of renal origin: Secondary | ICD-10-CM | POA: Diagnosis not present

## 2023-05-07 DIAGNOSIS — N186 End stage renal disease: Secondary | ICD-10-CM | POA: Diagnosis not present

## 2023-05-12 DIAGNOSIS — N186 End stage renal disease: Secondary | ICD-10-CM | POA: Diagnosis not present

## 2023-05-12 DIAGNOSIS — N2581 Secondary hyperparathyroidism of renal origin: Secondary | ICD-10-CM | POA: Diagnosis not present

## 2023-05-12 DIAGNOSIS — Z992 Dependence on renal dialysis: Secondary | ICD-10-CM | POA: Diagnosis not present

## 2023-05-14 DIAGNOSIS — N2581 Secondary hyperparathyroidism of renal origin: Secondary | ICD-10-CM | POA: Diagnosis not present

## 2023-05-14 DIAGNOSIS — N186 End stage renal disease: Secondary | ICD-10-CM | POA: Diagnosis not present

## 2023-05-14 DIAGNOSIS — Z992 Dependence on renal dialysis: Secondary | ICD-10-CM | POA: Diagnosis not present

## 2023-05-25 ENCOUNTER — Emergency Department (HOSPITAL_COMMUNITY)
Admission: EM | Admit: 2023-05-25 | Discharge: 2023-05-25 | Disposition: A | Discharge status: Home / Self Care | Payer: Medicare HMO | Attending: Emergency Medicine | Admitting: Emergency Medicine

## 2023-05-25 ENCOUNTER — Emergency Department (HOSPITAL_COMMUNITY): Payer: Medicare HMO

## 2023-05-25 ENCOUNTER — Encounter (HOSPITAL_COMMUNITY): Payer: Self-pay

## 2023-05-25 ENCOUNTER — Other Ambulatory Visit: Payer: Self-pay

## 2023-05-25 DIAGNOSIS — Z794 Long term (current) use of insulin: Secondary | ICD-10-CM | POA: Insufficient documentation

## 2023-05-25 DIAGNOSIS — I517 Cardiomegaly: Secondary | ICD-10-CM | POA: Diagnosis not present

## 2023-05-25 DIAGNOSIS — N186 End stage renal disease: Secondary | ICD-10-CM

## 2023-05-25 DIAGNOSIS — I120 Hypertensive chronic kidney disease with stage 5 chronic kidney disease or end stage renal disease: Secondary | ICD-10-CM | POA: Diagnosis not present

## 2023-05-25 DIAGNOSIS — R0602 Shortness of breath: Secondary | ICD-10-CM | POA: Diagnosis not present

## 2023-05-25 DIAGNOSIS — Z992 Dependence on renal dialysis: Secondary | ICD-10-CM | POA: Insufficient documentation

## 2023-05-25 LAB — CBC WITH DIFFERENTIAL/PLATELET
Abs Immature Granulocytes: 0.03 10*3/uL (ref 0.00–0.07)
Basophils Absolute: 0 10*3/uL (ref 0.0–0.1)
Basophils Relative: 1 %
Eosinophils Absolute: 0.1 10*3/uL (ref 0.0–0.5)
Eosinophils Relative: 1 %
HCT: 33.1 % — ABNORMAL LOW (ref 39.0–52.0)
Hemoglobin: 10.4 g/dL — ABNORMAL LOW (ref 13.0–17.0)
Immature Granulocytes: 0 %
Lymphocytes Relative: 18 %
Lymphs Abs: 1.6 10*3/uL (ref 0.7–4.0)
MCH: 27.9 pg (ref 26.0–34.0)
MCHC: 31.4 g/dL (ref 30.0–36.0)
MCV: 88.7 fL (ref 80.0–100.0)
Monocytes Absolute: 0.7 10*3/uL (ref 0.1–1.0)
Monocytes Relative: 8 %
Neutro Abs: 6.1 10*3/uL (ref 1.7–7.7)
Neutrophils Relative %: 72 %
Platelets: 192 10*3/uL (ref 150–400)
RBC: 3.73 MIL/uL — ABNORMAL LOW (ref 4.22–5.81)
RDW: 15.6 % — ABNORMAL HIGH (ref 11.5–15.5)
WBC: 8.6 10*3/uL (ref 4.0–10.5)
nRBC: 0 % (ref 0.0–0.2)

## 2023-05-25 LAB — BASIC METABOLIC PANEL
Anion gap: 14 (ref 5–15)
BUN: 99 mg/dL — ABNORMAL HIGH (ref 6–20)
CO2: 19 mmol/L — ABNORMAL LOW (ref 22–32)
Calcium: 6.4 mg/dL — CL (ref 8.9–10.3)
Chloride: 102 mmol/L (ref 98–111)
Creatinine, Ser: 11.42 mg/dL — ABNORMAL HIGH (ref 0.61–1.24)
GFR, Estimated: 5 mL/min — ABNORMAL LOW (ref >60)
Glucose, Bld: 319 mg/dL — ABNORMAL HIGH (ref 70–99)
Potassium: 3.8 mmol/L (ref 3.5–5.1)
Sodium: 135 mmol/L (ref 135–145)

## 2023-05-25 MED ORDER — ALBUTEROL SULFATE (2.5 MG/3ML) 0.083% IN NEBU
2.5000 mg | INHALATION_SOLUTION | Freq: Four times a day (QID) | RESPIRATORY_TRACT | 12 refills | Status: DC | PRN
  Filled 2023-05-25: qty 75, 7d supply, fill #0

## 2023-05-25 MED ORDER — ALBUTEROL SULFATE HFA 108 (90 BASE) MCG/ACT IN AERS
1.0000 | INHALATION_SPRAY | Freq: Four times a day (QID) | RESPIRATORY_TRACT | Status: DC | PRN
  Administered 2023-05-25: 2 via RESPIRATORY_TRACT
  Filled 2023-05-25: qty 6.7

## 2023-05-25 MED ORDER — IPRATROPIUM-ALBUTEROL 0.5-2.5 (3) MG/3ML IN SOLN
3.0000 mL | Freq: Once | RESPIRATORY_TRACT | Status: AC
Start: 2023-05-25 — End: 2023-05-25
  Administered 2023-05-25: 3 mL via RESPIRATORY_TRACT
  Filled 2023-05-25: qty 3

## 2023-05-25 MED ORDER — HYDRALAZINE HCL 20 MG/ML IJ SOLN
10.0000 mg | Freq: Once | INTRAMUSCULAR | Status: AC
  Administered 2023-05-25: 10 mg via INTRAVENOUS
  Filled 2023-05-25: qty 1

## 2023-05-25 NOTE — Discharge Instructions (Addendum)
Return for any problem.   Go to dialysis tomorrow morning.

## 2023-05-25 NOTE — ED Notes (Signed)
Provider made aware of BP before discharge.

## 2023-05-25 NOTE — ED Provider Notes (Signed)
[place] EMERGENCY DEPARTMENT AT [facility] Provider Note   CSN: [id number] Arrival date & time: 05/25/23  1412     History  Chief Complaint  Patient presents with   Shortness of Breath    Matthew Odom is a 53 y.o. male.  53 year old male with prior medical history as detailed below presents for evaluation.  Patient with ESRD on HD.  Patient reports that he is typically a Tuesday, Thursday, Saturday dialysis patient.  Patient reports that he missed dialysis last week secondary to not having transportation.  His last dialysis session was completed last Saturday -approximately 9 days ago.  He still makes urine.  He is scheduled for dialysis tomorrow morning at 6 AM.  He is advised me that he does have transportation to dialysis tomorrow morning.  He came to the ED today secondary to developing some mild shortness of breath.  He was concerned that he had excessive fluid buildup secondary to having missed dialysis.  The history is provided by the patient and medical records.       Home Medications Prior to Admission medications   Medication Sig Start Date End Date Taking? Authorizing Provider  acetaminophen (TYLENOL) 500 MG tablet Take 500-1,000 mg by mouth every 6 (six) hours as needed (pain.).    [provider]  albuterol (PROVENTIL) (2.5 MG/3ML) 0.083% nebulizer solution Take 3 mLs (2.5 mg total) by nebulization every 6 (six) hours as needed for wheezing or shortness of breath. 01/14/23   Rema Fendt, NP  amLODipine (NORVASC) 10 MG tablet Take 1 tablet (10 mg total) by mouth daily. 12/05/22   Sande Rives, MD  atorvastatin (LIPITOR) 80 MG tablet Take 1 tablet (80 mg total) by mouth daily. 11/13/21 01/14/23  Kathlen Mody, MD  budesonide-formoterol (SYMBICORT) 160-4.5 MCG/ACT inhaler Inhale 2 puffs into the lungs 2 (two) times daily. 01/14/23   Rema Fendt, NP  calcium acetate (PHOSLO) 667 MG capsule Take 2 capsules (1,334 mg total) by mouth 3  (three) times daily with meals 06/25/21     Calcium Carbonate Antacid (TUMS PO) Take 2 tablets by mouth 3 (three) times daily with meals. Patient not taking: Reported on 01/14/2023    [provider]  carvedilol (COREG) 25 MG tablet Take 1 tablet (25 mg total) by mouth 2 (two) times daily with a meal. 12/05/22   O'Neal, Ronnald Ramp, MD  doxazosin (CARDURA) 8 MG tablet Take 1 tablet (8 mg total) by mouth daily. 12/05/22   O'NealRonnald Ramp, MD  hydrALAZINE (APRESOLINE) 100 MG tablet Take 1 tablet (100 mg total) by mouth 3 (three) times daily. 12/05/22   O'NealRonnald Ramp, MD  Insulin Glargine [facility]) 100 UNIT/ML Inject 20 Units into the skin daily. Patient taking differently: Inject 10 Units into the skin 2 (two) times daily. 06/19/21   Shamleffer, Konrad Dolores, MD  Insulin Pen Needle 31G X 6 MM MISC Use as directed in the morning, at noon, in the evening, and at bedtime. 06/19/21   Shamleffer, Konrad Dolores, MD  ketorolac (TORADOL) 10 MG tablet Take 1 tablet (10 mg total) by mouth every 6 (six) hours as needed. 08/20/22   Small, Brooke L, PA  liraglutide (VICTOZA) 18 MG/3ML SOPN Inject 1.8 mg into the skin daily. 06/19/21   Shamleffer, Konrad Dolores, MD  Misc. Devices MISC Please provide patient with insurance approved CPAP with the following settings:  autopap 15-20.  Large size [employer] Full Face Mask Forma mask and heated humidification. Patient not  taking: Reported on 01/14/2023 12/25/19   Claiborne Rigg, NP  nicotine (NICODERM CQ - DOSED IN MG/24 HOURS) 14 mg/24hr patch Place 1 patch (14 mg total) onto the skin daily as needed. 01/14/23   Rema Fendt, NP  nitroGLYCERIN (NITROSTAT) 0.4 MG SL tablet Place 1 tablet (0.4 mg total) under the tongue every 5 (five) minutes as needed for up to 30 doses for chest pain. 07/16/21   Terald Sleeper, MD  oxyCODONE-acetaminophen (PERCOCET/ROXICET) 5-325 MG tablet Take 1 tablet by mouth every 6 (six) hours as needed for severe  pain. 01/18/23   Gwyneth Sprout, MD  sertraline (ZOLOFT) 25 MG tablet Take 1 tablet (25 mg total) by mouth daily. 01/14/23   Rema Fendt, NP      Allergies    Aspirin and Nitroglycerin    Review of Systems   Review of Systems  All other systems reviewed and are negative.   Physical Exam Updated Vital Signs BP (!) 207/109   Pulse 76   Temp 98.3 F (36.8 C) (Oral)   Resp 18   Ht 5\' 11"  (1.803 m)   Wt 123.3 kg   SpO2 95%   BMI 37.91 kg/m  Physical Exam Vitals and nursing note reviewed.  Constitutional:      General: He is not in acute distress.    Appearance: Normal appearance. He is well-developed.  HENT:     Head: Normocephalic and atraumatic.  Eyes:     Conjunctiva/sclera: Conjunctivae normal.     Pupils: Pupils are equal, round, and reactive to light.  Cardiovascular:     Rate and Rhythm: Normal rate and regular rhythm.     Heart sounds: Normal heart sounds.  Pulmonary:     Effort: Pulmonary effort is normal. No respiratory distress.     Breath sounds: Normal breath sounds.  Abdominal:     General: There is no distension.     Palpations: Abdomen is soft.     Tenderness: There is no abdominal tenderness.  Musculoskeletal:        General: No deformity. Normal range of motion.     Cervical back: Normal range of motion and neck supple.  Skin:    General: Skin is warm and dry.  Neurological:     General: No focal deficit present.     Mental Status: He is alert and oriented to person, place, and time.     ED Results / Procedures / Treatments   Labs (all labs ordered are listed, but only abnormal results are displayed) Labs Reviewed  BASIC METABOLIC PANEL - Abnormal; Notable for the following components:      Result Value   CO2 19 (*)    Glucose, Bld 319 (*)    BUN 99 (*)    Creatinine, Ser 11.42 (*)    Calcium 6.4 (*)    GFR, Estimated 5 (*)    All other components within normal limits  CBC WITH DIFFERENTIAL/PLATELET - Abnormal; Notable for the  following components:   RBC 3.73 (*)    Hemoglobin 10.4 (*)    HCT 33.1 (*)    RDW 15.6 (*)    All other components within normal limits  URINALYSIS, W/ REFLEX TO CULTURE (INFECTION SUSPECTED)    EKG EKG Interpretation Date/Time:  Monday May 25 2023 15:12:07 EST Ventricular Rate:  79 PR Interval:  188 QRS Duration:  131 QT Interval:  463 QTC Calculation: 531 R Axis:   101  Text Interpretation: Sinus rhythm Nonspecific intraventricular conduction  delay Abnormal lateral Q waves Borderline ST elevation, anterior leads Confirmed by Kristine Royal [phone]) on 05/25/2023 3:16:40 PM  Radiology No results found.  Procedures Procedures    Medications Ordered in ED Medications - No data to display  ED Course/ Medical Decision Making/ A&P                                 Medical Decision Making Amount and/or Complexity of Data Reviewed Labs: ordered. Radiology: ordered.  Risk Prescription drug management.    Medical Screen Complete  This patient presented to the ED with complaint of ESRD, missed HD.  This complaint involves an extensive number of treatment options. The initial differential diagnosis includes, but is not limited to, metabolic abnormality, etc.  This presentation is: Acute, Chronic, Self-Limited, Previously Undiagnosed, Uncertain Prognosis, Complicated, Systemic Symptoms, and Threat to Life/Bodily Function  Patient with known history of ESRD on HD.  He reports missed dialysis sessions last week.  Patient is reassuringly without evidence of hypoxia.  Additionally his potassium is 3.8.  BUN is 99 with a creatinine of 11.4.  Chest x-ray is clear.  Case discussed with on-call nephrology -Dr. Arlean Hopping -who does not feel that the patient requires emergent dialysis.  Patient feels much improved after ED evaluation.  He assures me that he has transport to his outpatient dialysis session tomorrow morning.  Importance of close follow-up is stressed.  Strict  return precautions given and understood.  Additional history obtained:  External records from outside sources obtained and reviewed including prior ED visits and prior Inpatient records.    Lab Tests:  I ordered and personally interpreted labs.  The pertinent results include: CBC, BMP   Imaging Studies ordered:  I ordered imaging studies including chest x-ray I independently visualized and interpreted obtained imaging which showed NAD I agree with the radiologist interpretation.   Cardiac Monitoring:  The patient was maintained on a cardiac monitor.  I personally viewed and interpreted the cardiac monitor which showed an underlying rhythm of: NSR   Medicines ordered:  I ordered medication including DuoNeb treatment, hydralazine for hypertension, dyspnea Reevaluation of the patient after these medicines showed that the patient: improved  Problem List / ED Course:  ESRD on HD, missed dialysis   Reevaluation:  After the interventions noted above, I reevaluated the patient and found that they have: improved  Disposition:  After consideration of the diagnostic results and the patients response to treatment, I feel that the patent would benefit from close outpatient follow-up.          Final Clinical Impression(s) / ED Diagnoses Final diagnoses:  ESRD (end stage renal disease) (HCC)    Rx / DC Orders ED Discharge Orders     None         Wynetta Fines, MD 05/25/23 2214

## 2023-05-25 NOTE — ED Notes (Addendum)
Ambulated pt with pulse Ox.  Half way through walk pt sats decreased to 92 RA, upon return and pt sitting on the bed O2 sat returned to 97. 2L of [place] was placed for pt comfort because he seemed to have an increase SOB.

## 2023-05-25 NOTE — ED Notes (Signed)
Pt verbalized that the nebulizer treatment was effective and now is 100 on RA
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
## 2023-05-26 ENCOUNTER — Other Ambulatory Visit: Payer: Self-pay

## 2023-05-26 DIAGNOSIS — N2581 Secondary hyperparathyroidism of renal origin: Secondary | ICD-10-CM | POA: Diagnosis not present

## 2023-05-26 DIAGNOSIS — N186 End stage renal disease: Secondary | ICD-10-CM | POA: Diagnosis not present

## 2023-05-26 DIAGNOSIS — Z992 Dependence on renal dialysis: Secondary | ICD-10-CM | POA: Diagnosis not present

## 2023-05-28 ENCOUNTER — Ambulatory Visit: Payer: Medicare HMO

## 2023-06-01 ENCOUNTER — Ambulatory Visit (HOSPITAL_COMMUNITY): Payer: Self-pay | Admitting: Student

## 2023-06-02 DIAGNOSIS — Z992 Dependence on renal dialysis: Secondary | ICD-10-CM | POA: Diagnosis not present

## 2023-06-02 DIAGNOSIS — N186 End stage renal disease: Secondary | ICD-10-CM | POA: Diagnosis not present

## 2023-06-02 DIAGNOSIS — N2581 Secondary hyperparathyroidism of renal origin: Secondary | ICD-10-CM | POA: Diagnosis not present

## 2023-06-04 ENCOUNTER — Other Ambulatory Visit: Payer: Self-pay

## 2023-06-04 DIAGNOSIS — F17200 Nicotine dependence, unspecified, uncomplicated: Secondary | ICD-10-CM | POA: Diagnosis not present

## 2023-06-04 DIAGNOSIS — W228XXA Striking against or struck by other objects, initial encounter: Secondary | ICD-10-CM | POA: Diagnosis not present

## 2023-06-04 DIAGNOSIS — S8262XA Displaced fracture of lateral malleolus of left fibula, initial encounter for closed fracture: Secondary | ICD-10-CM | POA: Diagnosis not present

## 2023-06-04 DIAGNOSIS — M79672 Pain in left foot: Secondary | ICD-10-CM | POA: Diagnosis not present

## 2023-06-04 DIAGNOSIS — S99922A Unspecified injury of left foot, initial encounter: Secondary | ICD-10-CM | POA: Diagnosis not present

## 2023-06-05 DIAGNOSIS — E1129 Type 2 diabetes mellitus with other diabetic kidney complication: Secondary | ICD-10-CM | POA: Diagnosis not present

## 2023-06-05 DIAGNOSIS — Z992 Dependence on renal dialysis: Secondary | ICD-10-CM | POA: Diagnosis not present

## 2023-06-05 DIAGNOSIS — N186 End stage renal disease: Secondary | ICD-10-CM | POA: Diagnosis not present

## 2023-06-08 ENCOUNTER — Ambulatory Visit (HOSPITAL_COMMUNITY): Payer: Medicare HMO | Admitting: Student

## 2023-06-09 DIAGNOSIS — Z992 Dependence on renal dialysis: Secondary | ICD-10-CM | POA: Diagnosis not present

## 2023-06-09 DIAGNOSIS — N186 End stage renal disease: Secondary | ICD-10-CM | POA: Diagnosis not present

## 2023-06-09 DIAGNOSIS — N2581 Secondary hyperparathyroidism of renal origin: Secondary | ICD-10-CM | POA: Diagnosis not present

## 2023-06-10 DIAGNOSIS — S82842A Displaced bimalleolar fracture of left lower leg, initial encounter for closed fracture: Secondary | ICD-10-CM | POA: Diagnosis not present

## 2023-06-11 ENCOUNTER — Telehealth: Payer: Self-pay

## 2023-06-11 ENCOUNTER — Other Ambulatory Visit: Payer: Self-pay | Admitting: Orthopaedic Surgery

## 2023-06-11 ENCOUNTER — Telehealth: Payer: Self-pay | Admitting: Cardiovascular Disease

## 2023-06-11 ENCOUNTER — Telehealth: Payer: Self-pay | Admitting: Family

## 2023-06-11 DIAGNOSIS — N2581 Secondary hyperparathyroidism of renal origin: Secondary | ICD-10-CM | POA: Diagnosis not present

## 2023-06-11 DIAGNOSIS — N186 End stage renal disease: Secondary | ICD-10-CM | POA: Diagnosis not present

## 2023-06-11 DIAGNOSIS — Z992 Dependence on renal dialysis: Secondary | ICD-10-CM | POA: Diagnosis not present

## 2023-06-11 NOTE — Telephone Encounter (Signed)
  Patient Consent for Virtual Visit        Matthew Odom has provided verbal consent on 06/11/2023 for a virtual visit (video or telephone).   CONSENT FOR VIRTUAL VISIT FOR:  Matthew Odom  By participating in this virtual visit I agree to the following:  I hereby voluntarily request, consent and authorize [facility] and its employed or contracted physicians, physician assistants, nurse practitioners or other licensed health care professionals (the Practitioner), to provide me with telemedicine health care services (the "Services) as deemed necessary by the treating Practitioner. I acknowledge and consent to receive the Services by the Practitioner via telemedicine. I understand that the telemedicine visit will involve communicating with the Practitioner through live audiovisual communication technology and the disclosure of certain medical information by electronic transmission. I acknowledge that I have been given the opportunity to request an in-person assessment or other available alternative prior to the telemedicine visit and am voluntarily participating in the telemedicine visit.  I understand that I have the right to withhold or withdraw my consent to the use of telemedicine in the course of my care at any time, without affecting my right to future care or treatment, and that the Practitioner or I may terminate the telemedicine visit at any time. I understand that I have the right to inspect all information obtained and/or recorded in the course of the telemedicine visit and may receive copies of available information for a reasonable fee.  I understand that some of the potential risks of receiving the Services via telemedicine include:  Delay or interruption in medical evaluation due to technological equipment failure or disruption; Information transmitted may not be sufficient (e.g. poor resolution of images) to allow for appropriate medical decision making by the Practitioner;  and/or  In rare instances, security protocols could fail, causing a breach of personal health information.  Furthermore, I acknowledge that it is my responsibility to provide information about my medical history, conditions and care that is complete and accurate to the best of my ability. I acknowledge that Practitioner's advice, recommendations, and/or decision may be based on factors not within their control, such as incomplete or inaccurate data provided by me or distortions of diagnostic images or specimens that may result from electronic transmissions. I understand that the practice of medicine is not an exact science and that Practitioner makes no warranties or guarantees regarding treatment outcomes. I acknowledge that a copy of this consent can be made available to me via my patient portal [facility] MyChart), or I can request a printed copy by calling the office of [facility].    I understand that my insurance will be billed for this visit.   I have read or had this consent read to me. I understand the contents of this consent, which adequately explains the benefits and risks of the Services being provided via telemedicine.  I have been provided ample opportunity to ask questions regarding this consent and the Services and have had my questions answered to my satisfaction. I give my informed consent for the services to be provided through the use of telemedicine in my medical care

## 2023-06-11 NOTE — Telephone Encounter (Signed)
 Preop televisit now scheduled, slot ok per APP. Med rec and consent done.

## 2023-06-11 NOTE — Telephone Encounter (Signed)
 A document form has been faxed: Surgical Clearance, to be filled out by provider. Send document back via Fax within ASAP. Document is located in providers tray at front office.           Fax number:  [phone]     *Called pt and left vm to call office back asap to get scheduled to fill paperwork. Waiting for callback. Pt is scheduled for surgery on 06/16/23.

## 2023-06-11 NOTE — Telephone Encounter (Signed)
   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

## 2023-06-11 NOTE — Telephone Encounter (Signed)
   Name: Matthew Odom  DOB: 07-Dec-1970  MRN: [id number]  Primary Cardiologist: Darryle ONEIDA Decent, MD  Preoperative team, please contact this patient and set up a phone call appointment for further preoperative risk assessment. Please obtain consent and complete medication review. Thank you for your help.  Patient will need to be an add-on for either 2/6 or 06/12/2023.  Patient is not listed as taking an antiplatelet or anticoagulant.   I also confirmed the patient resides in the state of [place] . As per [facility] Medical Board telemedicine laws, the patient must reside in the state in which the provider is licensed.  [name] D Butch Otterson, NP 06/11/2023, 11:29 AM [facility]

## 2023-06-12 ENCOUNTER — Ambulatory Visit: Payer: Medicare HMO | Attending: Cardiology

## 2023-06-12 DIAGNOSIS — Z01810 Encounter for preprocedural cardiovascular examination: Secondary | ICD-10-CM

## 2023-06-12 NOTE — Patient Instructions (Signed)
 DUE TO COVID-19 ONLY TWO VISITORS  (aged 53 and older)  ARE ALLOWED TO COME WITH YOU AND STAY IN THE WAITING ROOM ONLY DURING PRE OP AND PROCEDURE.   **NO VISITORS ARE ALLOWED IN THE SHORT STAY AREA OR RECOVERY ROOM!!**  IF YOU WILL BE ADMITTED INTO THE HOSPITAL YOU ARE ALLOWED ONLY FOUR SUPPORT PEOPLE DURING VISITATION HOURS ONLY (7 AM -8PM)   The support person(s) must pass our screening, gel in and out, and wear a mask at all times, including in the patient's room. Patients must also wear a mask when staff or their support person are in the room. Visitors GUEST BADGE MUST BE WORN VISIBLY  One adult visitor may remain with you overnight and MUST be in the room by 8 P.M.     Your procedure is scheduled on: 06/16/23   Report to [facility] Main Entrance    Report to admitting at : 9:45 AM   Call this number if you have problems the morning of surgery [phone]   Do not eat food :After Midnight.   After Midnight you may have the following liquids until : 9:00 AM DAY OF SURGERY  Water  Black Coffee (sugar ok, NO MILK/CREAM OR CREAMERS)  Tea (sugar ok, NO MILK/CREAM OR CREAMERS) regular and decaf                             Plain Jell-O (NO RED)                                           Fruit ices (not with fruit pulp, NO RED)                                     Popsicles (NO RED)                                                                  Juice: apple, WHITE grape, WHITE cranberry Sports drinks like Gatorade (NO RED)   The day of surgery:  Drink ONE (1) Pre-Surgery Clear G2 at : 9:00 AM the morning of surgery. Drink in one sitting. Do not sip.  This drink was given to you during your hospital  pre-op appointment visit. Nothing else to drink after completing the  Pre-Surgery Clear Ensure or G2.          If you have questions, please contact your surgeon's office.  FOLLOW ANY ADDITIONAL PRE OP INSTRUCTIONS YOU RECEIVED FROM YOUR SURGEON'S OFFICE!!!   Oral Hygiene  is also important to reduce your risk of infection.                                    Remember - BRUSH YOUR TEETH THE MORNING OF SURGERY WITH YOUR REGULAR TOOTHPASTE  DENTURES WILL BE REMOVED PRIOR TO SURGERY PLEASE DO NOT APPLY "Poly grip" OR ADHESIVES!!!   Do NOT smoke after Midnight   Take these medicines the morning of surgery  with A SIP OF WATER : sertraline ,doxazosin ,hydralazine ,carvedilol ,amlodipine .Use inhalers as usual.Tylenol  as needed.  How to Manage Your Diabetes Before and After Surgery  Why is it important to control my blood sugar before and after surgery? Improving blood sugar levels before and after surgery helps healing and can limit problems. A way of improving blood sugar control is eating a healthy diet by:  Eating less sugar and carbohydrates  Increasing activity/exercise  Talking with your doctor about reaching your blood sugar goals High blood sugars (greater than 180 mg/dL) can raise your risk of infections and slow your recovery, so you will need to focus on controlling your diabetes during the weeks before surgery. Make sure that the doctor who takes care of your diabetes knows about your planned surgery including the date and location.  How do I manage my blood sugar before surgery? Check your blood sugar at least 4 times a day, starting 2 days before surgery, to make sure that the level is not too high or low. Check your blood sugar the morning of your surgery when you wake up and every 2 hours until you get to the Short Stay unit. If your blood sugar is less than 70 mg/dL, you will need to treat for low blood sugar: Do not take insulin . Treat a low blood sugar (less than 70 mg/dL) with  cup of clear juice (cranberry or apple), 4 glucose tablets, OR glucose gel. Recheck blood sugar in 15 minutes after treatment (to make sure it is greater than 70 mg/dL). If your blood sugar is not greater than 70 mg/dL on recheck, call [phone] for further  instructions. Report your blood sugar to the short stay nurse when you get to Short Stay.  If you are admitted to the hospital after surgery: Your blood sugar will be checked by the staff and you will probably be given insulin  after surgery (instead of oral diabetes medicines) to make sure you have good blood sugar levels. The goal for blood sugar control after surgery is 80-180 mg/dL.   WHAT DO I DO ABOUT MY DIABETES MEDICATION?  THE NIGHT BEFORE SURGERY,take ONLY half of the glargine insulin  dose (5 units)    THE MORNING OF SURGERY, take ONLY half of the glargine insulin  dose (5 units)   .DO NOT TAKE ANY ORAL DIABETIC MEDICATIONS DAY OF YOUR SURGERY. DO NOT take victosa.  DO NOT TAKE THE FOLLOWING 7 DAYS PRIOR TO SURGERY: Ozempic, Wegovy, Rybelsus (Semaglutide), Byetta (exenatide), Bydureon (exenatide ER), Victoza , Saxenda  (liraglutide ), or Trulicity (dulaglutide) Mounjaro (Tirzepatide) Adlyxin (Lixisenatide), Polyethylene Glycol Loxenatide.  If your CBG is greater than 220 mg/dL, you may take  of your sliding scale  (correction) dose of insulin .   Bring CPAP mask and tubing day of surgery.                              You may not have any metal on your body including hair pins, jewelry, and body piercing             Do not wear lotions, powders, perfumes/cologne, or deodorant              Men may shave face and neck.   Do not bring valuables to the hospital. [facility] IS NOT             RESPONSIBLE   FOR VALUABLES.   Contacts, glasses, or bridgework may not be worn into surgery.   Bring small overnight  bag day of surgery.   DO NOT BRING YOUR HOME MEDICATIONS TO THE HOSPITAL. PHARMACY WILL DISPENSE MEDICATIONS LISTED ON YOUR MEDICATION LIST TO YOU DURING YOUR ADMISSION IN THE HOSPITAL!    Patients discharged on the day of surgery will not be allowed to drive home.  Someone NEEDS to stay with you for the first 24 hours after anesthesia.   Special Instructions: Bring a copy  of your healthcare power of attorney and living will documents         the day of surgery if you haven't scanned them before.              Please read over the following fact sheets you were given: IF YOU HAVE QUESTIONS ABOUT YOUR PRE-OP INSTRUCTIONS PLEASE CALL [phone]    [facility] - Preparing for Surgery Before surgery, you can play an important role.  Because skin is not sterile, your skin needs to be as free of germs as possible.  You can reduce the number of germs on your skin by washing with CHG (chlorahexidine gluconate) soap before surgery.  CHG is an antiseptic cleaner which kills germs and bonds with the skin to continue killing germs even after washing. Please DO NOT use if you have an allergy to CHG or antibacterial soaps.  If your skin becomes reddened/irritated stop using the CHG and inform your nurse when you arrive at Short Stay. Do not shave (including legs and underarms) for at least 48 hours prior to the first CHG shower.  You may shave your face/neck. Please follow these instructions carefully:  1.  Shower with CHG Soap the night before surgery and the  morning of Surgery.  2.  If you choose to wash your hair, wash your hair first as usual with your  normal  shampoo.  3.  After you shampoo, rinse your hair and body thoroughly to remove the  shampoo.                           4.  Use CHG as you would any other liquid soap.  You can apply chg directly  to the skin and wash                       Gently with a scrungie or clean washcloth.  5.  Apply the CHG Soap to your body ONLY FROM THE NECK DOWN.   Do not use on face/ open                           Wound or open sores. Avoid contact with eyes, ears mouth and genitals (private parts).                       Wash face,  Genitals (private parts) with your normal soap.             6.  Wash thoroughly, paying special attention to the area where your surgery  will be performed.  7.  Thoroughly rinse your body with warm water  from  the neck down.  8.  DO NOT shower/wash with your normal soap after using and rinsing off  the CHG Soap.                9.  Pat yourself dry with a clean towel.            10.  Wear clean pajamas.            11.  Place clean sheets on your bed the night of your first shower and do not  sleep with pets. Day of Surgery : Do not apply any lotions/deodorants the morning of surgery.  Please wear clean clothes to the hospital/surgery center.  FAILURE TO FOLLOW THESE INSTRUCTIONS MAY RESULT IN THE CANCELLATION OF YOUR SURGERY PATIENT SIGNATURE_________________________________  NURSE SIGNATURE__________________________________  ________________________________________________________________________  Benjamen Brand  An incentive spirometer is a tool that can help keep your lungs clear and active. This tool measures how well you are filling your lungs with each breath. Taking long deep breaths may help reverse or decrease the chance of developing breathing (pulmonary) problems (especially infection) following: A long period of time when you are unable to move or be active. BEFORE THE PROCEDURE  If the spirometer includes an indicator to show your best effort, your nurse or respiratory therapist will set it to a desired goal. If possible, sit up straight or lean slightly forward. Try not to slouch. Hold the incentive spirometer in an upright position. INSTRUCTIONS FOR USE  Sit on the edge of your bed if possible, or sit up as far as you can in bed or on a chair. Hold the incentive spirometer in an upright position. Breathe out normally. Place the mouthpiece in your mouth and seal your lips tightly around it. Breathe in slowly and as deeply as possible, raising the piston or the ball toward the top of the column. Hold your breath for 3-5 seconds or for as long as possible. Allow the piston or ball to fall to the bottom of the column. Remove the mouthpiece from your mouth and breathe out  normally. Rest for a few seconds and repeat Steps 1 through 7 at least 10 times every 1-2 hours when you are awake. Take your time and take a few normal breaths between deep breaths. The spirometer may include an indicator to show your best effort. Use the indicator as a goal to work toward during each repetition. After each set of 10 deep breaths, practice coughing to be sure your lungs are clear. If you have an incision (the cut made at the time of surgery), support your incision when coughing by placing a pillow or rolled up towels firmly against it. Once you are able to get out of bed, walk around indoors and cough well. You may stop using the incentive spirometer when instructed by your caregiver.  RISKS AND COMPLICATIONS Take your time so you do not get dizzy or light-headed. If you are in pain, you may need to take or ask for pain medication before doing incentive spirometry. It is harder to take a deep breath if you are having pain. AFTER USE Rest and breathe slowly and easily. It can be helpful to keep track of a log of your progress. Your caregiver can provide you with a simple table to help with this. If you are using the spirometer at home, follow these instructions: SEEK MEDICAL CARE IF:  You are having difficultly using the spirometer. You have trouble using the spirometer as often as instructed. Your pain medication is not giving enough relief while using the spirometer. You develop fever of 100.5 F (38.1 C) or higher. SEEK IMMEDIATE MEDICAL CARE IF:  You cough up bloody sputum that had not been present before. You develop fever of 102 F (38.9 C) or greater. You develop worsening pain at or near the incision site.  MAKE SURE YOU:  Understand these instructions. Will watch your condition. Will get help right away if you are not doing well or get worse. Document Released: 09/01/2006 Document Revised: 07/14/2011 Document Reviewed: 11/02/2006 [facility] Patient Information  2014 [place], [place].   ________________________________________________________________________

## 2023-06-12 NOTE — Progress Notes (Signed)
 Virtual Visit via Telephone Note   Because of RUSTIN ERHART co-morbid illnesses, he is at least at moderate risk for complications without adequate follow up.  This format is felt to be most appropriate for this patient at this time.  The patient did not have access to video technology/had technical difficulties with video requiring transitioning to audio format only (telephone).  All issues noted in this document were discussed and addressed.  No physical exam could be performed with this format.  Please refer to the patient's chart for his consent to telehealth for [name].  Evaluation Performed:  Preoperative cardiovascular risk assessment _____________   Date:  06/12/2023   Patient ID:  Matthew Odom, DOB 04-22-1971, MRN [id number] Patient Location:  Home Provider location:   Office  Primary Care Provider:  Lorren Greig PARAS, NP Primary Cardiologist:  Darryle ONEIDA Decent, MD  Chief Complaint / Patient Profile  53 y.o. y/o male with a h/o systolic heart failure ejection fraction recovered on last echo on 12/25/2022, ESRD on dialysis, SVT, hypertension, nonobstructive CAD, CVA, sleep apnea,  who is pending open treatment of left lateral Malleolus and Syndesmosis  and presents today for telephonic preoperative cardiovascular risk assessment. History of Present Illness   Matthew Odom is a 53 y.o. male who presents via audio/video conferencing for a telehealth visit today.  Pt was last seen in cardiology clinic on 12/05/2022 by Dr. Decent.  At that time Matthew Odom was doing well.  The patient is now pending procedure as outlined above. Since his last visit, he has remained stable from a cardiac standpoint.  Echocardiogram on 12/25/2022 indicated LVEF of 55 to 60%, LV with normal function and no wall motion abnormalities.  Patient reports that he has not missed any dialysis sessions this week.  Prior to his ankle injury he was easily able to achieve greater than 4 METS of  activity.  Today he denies chest pain, shortness of breath, lower extremity edema, fatigue, palpitations, melena, hematuria, hemoptysis, diaphoresis, weakness, presyncope, syncope, orthopnea, and PND.   Past Medical History    Past Medical History:  Diagnosis Date   Anxiety    BPH (benign prostatic hyperplasia)    Cocaine use    07/15/21- last time was more than 10 years ago.   Complication of anesthesia    wakes up during surgery   COPD (chronic obstructive pulmonary disease) (HCC)    Coronary artery disease 2021   mild non obstructed   Diabetes mellitus without complication (HCC)    Enlarged heart    ESRD (end stage renal disease) (HCC)    TTHSAT   History of degenerative disc disease    Hyperlipidemia    Hypertension    NSTEMI (non-ST elevated myocardial infarction) (HCC)    Pneumonia    Sciatic nerve pain    Sleep apnea    Stroke (HCC)    no residual, x 3 last one was in 2020   Thoracic ascending aortic aneurysm (HCC)    4.2 cm 08/03/21 CTA chest   Past Surgical History:  Procedure Laterality Date   AV FISTULA PLACEMENT Left 07/17/2021   Procedure: CREATION  OF LEFT ARM RADIOCEPHALIC ARTERIOVENOUS (AV) FISTULA;  Surgeon: Serene Gaile ORN, MD;  Location: [facility] OR;  Service: Vascular;  Laterality: Left;   CARDIAC CATHETERIZATION     IR FLUORO GUIDE CV LINE RIGHT  05/20/2021   IR US  GUIDE VASC ACCESS RIGHT  05/20/2021   LIGATION OF COMPETING BRANCHES OF ARTERIOVENOUS  FISTULA Left 09/13/2021   Procedure: LIGATION AND ELEVATION OF COMPETING BRANCHES OF LEFT ARM ARTERIOVENOUS FISTULA;  Surgeon: Serene Gaile ORN, MD;  Location: [facility] OR;  Service: Vascular;  Laterality: Left;    Allergies  Allergies  Allergen Reactions   Aspirin  Hives   Nitroglycerin  Other (See Comments)    Nitroglycerin  paste or IV will make blood pressure go up and cause headaches. The nitroglycerin  tablets do fine.    Home Medications    Prior to Admission medications   Medication Sig Start Date End  Date Taking? Authorizing Provider  acetaminophen  (TYLENOL ) 500 MG tablet Take 500-1,000 mg by mouth every 6 (six) hours as needed (pain.).    [provider]  albuterol  (PROVENTIL ) (2.5 MG/3ML) 0.083% nebulizer solution Take 3 mLs (2.5 mg total) by nebulization every 6 (six) hours as needed for wheezing or shortness of breath. 01/14/23   Lorren Greig PARAS, NP  albuterol  (PROVENTIL ) (2.5 MG/3ML) 0.083% nebulizer solution Take 3 mLs (2.5 mg total) by nebulization every 6 (six) hours as needed for wheezing or shortness of breath. 05/25/23   Laurice Maude BROCKS, MD  amLODipine  (NORVASC ) 10 MG tablet Take 1 tablet (10 mg total) by mouth daily. 12/05/22   O'NealDarryle Ned, MD  atorvastatin  (LIPITOR) 80 MG tablet Take 1 tablet (80 mg total) by mouth daily. 11/13/21 01/14/23  Akula, Vijaya, MD  budesonide -formoterol  (SYMBICORT ) 160-4.5 MCG/ACT inhaler Inhale 2 puffs into the lungs 2 (two) times daily. 01/14/23   Lorren Greig PARAS, NP  calcium  acetate (PHOSLO ) 667 MG capsule Take 2 capsules (1,334 mg total) by mouth 3 (three) times daily with meals 06/25/21     Calcium  Carbonate Antacid (TUMS PO) Take 2 tablets by mouth 3 (three) times daily with meals.    [provider]  carvedilol  (COREG ) 25 MG tablet Take 1 tablet (25 mg total) by mouth 2 (two) times daily with a meal. 12/05/22   O'Neal, Darryle Ned, MD  doxazosin  (CARDURA ) 8 MG tablet Take 1 tablet (8 mg total) by mouth daily. 12/05/22   O'NealDarryle Ned, MD  hydrALAZINE  (APRESOLINE ) 100 MG tablet Take 1 tablet (100 mg total) by mouth 3 (three) times daily. 12/05/22   O'NealDarryle Ned, MD  Insulin  Glargine (BASAGLAR  KWIKPEN) 100 UNIT/ML Inject 20 Units into the skin daily. Patient taking differently: Inject 10 Units into the skin 2 (two) times daily. 06/19/21   Shamleffer, Ibtehal Jaralla, MD  Insulin  Pen Needle 31G X 6 MM MISC Use as directed in the morning, at noon, in the evening, and at bedtime. 06/19/21   Shamleffer, Ibtehal Jaralla, MD   ketorolac  (TORADOL ) 10 MG tablet Take 1 tablet (10 mg total) by mouth every 6 (six) hours as needed. 08/20/22   Small, Brooke L, PA  liraglutide  (VICTOZA ) 18 MG/3ML SOPN Inject 1.8 mg into the skin daily. 06/19/21   Shamleffer, Donell Cardinal, MD  Misc. Devices MISC Please provide patient with insurance approved CPAP with the following settings:  autopap 15-20.  Large size [employer] Full Face Mask Forma mask and heated humidification. 12/25/19   Fleming, Zelda W, NP  nicotine  (NICODERM CQ  - DOSED IN MG/24 HOURS) 14 mg/24hr patch Place 1 patch (14 mg total) onto the skin daily as needed. 01/14/23   Lorren Greig PARAS, NP  nitroGLYCERIN  (NITROSTAT ) 0.4 MG SL tablet Place 1 tablet (0.4 mg total) under the tongue every 5 (five) minutes as needed for up to 30 doses for chest pain. 07/16/21   Cottie Donnice PARAS, MD  oxyCODONE -acetaminophen  (PERCOCET/ROXICET)  5-325 MG tablet Take 1 tablet by mouth every 6 (six) hours as needed for severe pain. 01/18/23   Doretha Folks, MD  sertraline  (ZOLOFT ) 25 MG tablet Take 1 tablet (25 mg total) by mouth daily. 01/14/23   Lorren Greig PARAS, NP    Physical Exam    Vital Signs:  ISLEY ZINNI does not have vital signs available for review today.  Given telephonic nature of communication, physical exam is limited. AAOx3. NAD. Normal affect.  Speech and respirations are unlabored.  Accessory Clinical Findings    None  Assessment & Plan    1.  Preoperative Cardiovascular Risk Assessment: open treatment of left lateral Malleolus and Syndesmosis   Mr. Daudelin's perioperative risk of a major cardiac event is 11% according to the Revised Cardiac Risk Index (RCRI).  Therefore, he is at high risk for perioperative complications.   His functional capacity is good at 7.44 METs according to the Duke Activity Status Index (DASI). Recommendations: According to ACC/AHA guidelines, no further cardiovascular testing needed.  The patient may proceed to surgery at acceptable risk.      The patient was advised that if he develops new symptoms prior to surgery to contact our office to arrange for a follow-up visit, and he verbalized understanding.  A copy of this note will be routed to requesting surgeon.  Time:   Today, I have spent 12 minutes with the patient with telehealth technology discussing medical history, symptoms, and management plan.     Lamonica Trueba D Marthella Osorno, NP  06/12/2023, 3:31 PM

## 2023-06-15 ENCOUNTER — Encounter (HOSPITAL_COMMUNITY): Payer: Self-pay

## 2023-06-15 ENCOUNTER — Telehealth: Payer: Self-pay | Admitting: Family

## 2023-06-15 ENCOUNTER — Encounter (HOSPITAL_COMMUNITY)
Admission: RE | Admit: 2023-06-15 | Discharge: 2023-06-15 | Disposition: A | Discharge status: Home / Self Care | Payer: Medicare HMO | Source: Ambulatory Visit | Attending: Orthopaedic Surgery | Admitting: Orthopaedic Surgery

## 2023-06-15 ENCOUNTER — Other Ambulatory Visit: Payer: Self-pay

## 2023-06-15 VITALS — BP 147/91 | HR 80 | Temp 98.5°F | Ht 71.0 in | Wt 262.3 lb

## 2023-06-15 DIAGNOSIS — J449 Chronic obstructive pulmonary disease, unspecified: Secondary | ICD-10-CM | POA: Diagnosis not present

## 2023-06-15 DIAGNOSIS — I2510 Atherosclerotic heart disease of native coronary artery without angina pectoris: Secondary | ICD-10-CM | POA: Diagnosis not present

## 2023-06-15 DIAGNOSIS — N186 End stage renal disease: Secondary | ICD-10-CM | POA: Diagnosis not present

## 2023-06-15 DIAGNOSIS — S82842A Displaced bimalleolar fracture of left lower leg, initial encounter for closed fracture: Secondary | ICD-10-CM | POA: Diagnosis not present

## 2023-06-15 DIAGNOSIS — Z01812 Encounter for preprocedural laboratory examination: Secondary | ICD-10-CM | POA: Diagnosis not present

## 2023-06-15 DIAGNOSIS — X58XXXA Exposure to other specified factors, initial encounter: Secondary | ICD-10-CM | POA: Diagnosis not present

## 2023-06-15 DIAGNOSIS — F17200 Nicotine dependence, unspecified, uncomplicated: Secondary | ICD-10-CM | POA: Diagnosis not present

## 2023-06-15 DIAGNOSIS — I132 Hypertensive heart and chronic kidney disease with heart failure and with stage 5 chronic kidney disease, or end stage renal disease: Secondary | ICD-10-CM | POA: Insufficient documentation

## 2023-06-15 DIAGNOSIS — Z992 Dependence on renal dialysis: Secondary | ICD-10-CM | POA: Insufficient documentation

## 2023-06-15 DIAGNOSIS — E1122 Type 2 diabetes mellitus with diabetic chronic kidney disease: Secondary | ICD-10-CM | POA: Insufficient documentation

## 2023-06-15 DIAGNOSIS — I5020 Unspecified systolic (congestive) heart failure: Secondary | ICD-10-CM | POA: Insufficient documentation

## 2023-06-15 DIAGNOSIS — Z794 Long term (current) use of insulin: Secondary | ICD-10-CM | POA: Insufficient documentation

## 2023-06-15 DIAGNOSIS — I10 Essential (primary) hypertension: Secondary | ICD-10-CM

## 2023-06-15 LAB — CBC
HCT: 30.5 % — ABNORMAL LOW (ref 39.0–52.0)
Hemoglobin: 9.4 g/dL — ABNORMAL LOW (ref 13.0–17.0)
MCH: 27 pg (ref 26.0–34.0)
MCHC: 30.8 g/dL (ref 30.0–36.0)
MCV: 87.6 fL (ref 80.0–100.0)
Platelets: 240 10*3/uL (ref 150–400)
RBC: 3.48 MIL/uL — ABNORMAL LOW (ref 4.22–5.81)
RDW: 14.9 % (ref 11.5–15.5)
WBC: 8.5 10*3/uL (ref 4.0–10.5)
nRBC: 0 % (ref 0.0–0.2)

## 2023-06-15 LAB — GLUCOSE, CAPILLARY: Glucose-Capillary: 194 mg/dL — ABNORMAL HIGH (ref 70–99)

## 2023-06-15 LAB — HEMOGLOBIN A1C
Hgb A1c MFr Bld: 7.2 % — ABNORMAL HIGH (ref 4.8–5.6)
Mean Plasma Glucose: 159.94 mg/dL

## 2023-06-15 NOTE — Anesthesia Preprocedure Evaluation (Addendum)
Anesthesia Evaluation  Patient identified by MRN, date of birth, ID band Patient awake    Reviewed: Allergy & Precautions, NPO status , Patient's Chart, lab work & pertinent test results, reviewed documented beta blocker date and time   History of Anesthesia Complications (+) AWARENESS UNDER ANESTHESIA and history of anesthetic complications  Airway Mallampati: III  TM Distance: >3 FB Neck ROM: Full    Dental  (+) Dental Advisory Given   Pulmonary neg shortness of breath, sleep apnea (no cpapdoes not use CPAP) , COPD (uses 2L O2 during dialysis sometimes),  oxygen dependent, neg recent URI, Current Smoker and Patient abstained from smoking.   Pulmonary exam normal breath sounds clear to auscultation       Cardiovascular hypertension, Pt. on medications and Pt. on home beta blockers + CAD, + Past MI and +CHF   Rhythm:Regular Rate:Normal  Echo 12/2022  1. Left ventricular ejection fraction, by estimation, is 55 to 60%. The  left ventricle has normal function. The left ventricle has no regional  wall motion abnormalities. There is severe concentric left ventricular  hypertrophy. Left ventricular diastolic   parameters are indeterminate.   2. Right ventricular systolic function is normal. The right ventricular  size is mildly enlarged. Tricuspid regurgitation signal is inadequate for  assessing PA pressure.   3. Left atrial size was mildly dilated.   4. The mitral valve is normal in structure. Trivial mitral valve  regurgitation. No evidence of mitral stenosis.   5. The aortic valve is normal in structure. Aortic valve regurgitation is  not visualized. No aortic stenosis is present.   6. The inferior vena cava is normal in size with greater than 50%  respiratory variability, suggesting right atrial pressure of 3 mmHg.      Neuro/Psych neg Seizures PSYCHIATRIC DISORDERS Anxiety     CVA (2020), No Residual Symptoms     GI/Hepatic negative GI ROS,,,(+)     substance abuse (remote hx)  cocaine use  Endo/Other  diabetes, Well Controlled, Type 2, Insulin Dependent  Obesity BMI 37  Renal/GU ESRF and DialysisRenal disease (last HD yesterday)HS T/Th/Sat  negative genitourinary   Musculoskeletal negative musculoskeletal ROS (+)    Abdominal  (+) + obese  Peds  Hematology  (+) Blood dyscrasia, anemia Hb 9.4   Anesthesia Other Findings Victoza LD: patient unsure  Reproductive/Obstetrics negative OB ROS                             Anesthesia Physical Anesthesia Plan  ASA: 3  Anesthesia Plan: General and Regional   Post-op Pain Management: Regional block*   Induction: Intravenous  PONV Risk Score and Plan: 1 and Ondansetron, Dexamethasone, Treatment may vary due to age or medical condition and Midazolam  Airway Management Planned: LMA  Additional Equipment: None  Intra-op Plan:   Post-operative Plan: Extubation in OR  Informed Consent: I have reviewed the patients History and Physical, chart, labs and discussed the procedure including the risks, benefits and alternatives for the proposed anesthesia with the patient or authorized representative who has indicated his/her understanding and acceptance.     Dental advisory given  Plan Discussed with: CRNA and Anesthesiologist  Anesthesia Plan Comments: (Discussed potential risks of nerve blocks including, but not limited to, infection, bleeding, nerve damage, seizures, pneumothorax, respiratory depression, and potential failure of the block. Alternatives to nerve blocks discussed. All questions answered.  Risks of general anesthesia discussed including, but not limited to, sore  throat, hoarse voice, chipped/damaged teeth, injury to vocal cords, nausea and vomiting, allergic reactions, lung infection, heart attack, stroke, and death. All questions answered. )        Anesthesia Quick Evaluation

## 2023-06-15 NOTE — Telephone Encounter (Signed)
 Please see encounter from [facility]

## 2023-06-15 NOTE — Progress Notes (Signed)
 Anesthesia Chart Review   Case: [id number] Date/Time: 06/16/23 1145   Procedures:      LEFT ANKLE ARTHROSCOPIC DEBRIDEMENT (Left: Ankle)     SYNDESMOSIS REPAIR (Left)     OPEN TREATMENT LATERAL MALLEOLUS (Left: Ankle)   Anesthesia type: General   Pre-op diagnosis: LEFT BIMALLEOLAR QUIVALENT ANKLE FRACTURE, SYNDESMOSIS DISRUPTION   Location: [facility] ROOM 02 / [facility]   Surgeons: Donnamarie Gables, MD       DISCUSSION:52 y.o. smoker with h/o HTN, COPD, nonobstructive CAD, systolic heart failure with recovered EF on last echo 12/25/2022, ESRD on dialysis T/TH/S, left bimalleolar ankle fracture scheduled for above procedure care 06/16/2023 with Dr. Lasandra Points.  Per cardiology preoperative evaluation 06/12/2023, "Mr. Ode's perioperative risk of a major cardiac event is 11% according to the Revised Cardiac Risk Index (RCRI).  Therefore, he is at high risk for perioperative complications.   His functional capacity is good at 7.44 METs according to the Duke Activity Status Index (DASI). Recommendations: According to ACC/AHA guidelines, no further cardiovascular testing needed.  The patient may proceed to surgery at acceptable risk."  Pt on dialysis T/Th/S.  Pt reports he will skip his Tuesday dialysis for surgery.  Last dialysis 06/13/2023.    H/o ED presentation 05/25/2023 with SOB after going without dialysis for 9 days due to transportation. Emergent dialysis was not required per nephrology.   Discussed with Dr. Otis Blocker.  Pt should have dialysis day before surgery.  Recommend moving the case so he can have dialysis before surgery.  Discussed with Thresea Flor.  VS: BP (!) 147/91   Pulse 80   Temp 36.9 C (Oral)   Ht 5\' 11"  (1.803 m)   Wt 119 kg   SpO2 95%   BMI 36.59 kg/m   PROVIDERS: Senaida Dama, NP is PCP    LABS: Labs reviewed: Acceptable for surgery. (all labs ordered are listed, but only abnormal results are displayed)  Labs Reviewed  CBC - Abnormal; Notable for the  following components:      Result Value   RBC 3.48 (*)    Hemoglobin 9.4 (*)    HCT 30.5 (*)    All other components within normal limits  GLUCOSE, CAPILLARY - Abnormal; Notable for the following components:   Glucose-Capillary 194 (*)    All other components within normal limits  HEMOGLOBIN A1C     IMAGES:   EKG:   CV: Echo 12/25/2022 1. Left ventricular ejection fraction, by estimation, is 55 to 60%. The  left ventricle has normal function. The left ventricle has no regional  wall motion abnormalities. There is severe concentric left ventricular  hypertrophy. Left ventricular diastolic   parameters are indeterminate.   2. Right ventricular systolic function is normal. The right ventricular  size is mildly enlarged. Tricuspid regurgitation signal is inadequate for  assessing PA pressure.   3. Left atrial size was mildly dilated.   4. The mitral valve is normal in structure. Trivial mitral valve  regurgitation. No evidence of mitral stenosis.   5. The aortic valve is normal in structure. Aortic valve regurgitation is  not visualized. No aortic stenosis is present.   6. The inferior vena cava is normal in size with greater than 50%  respiratory variability, suggesting right atrial pressure of 3 mmHg.  Past Medical History:  Diagnosis Date   Anxiety    BPH (benign prostatic hyperplasia)    Cocaine use    07/15/21- "last time was more than 10 years ago."  Complication of anesthesia    wakes up during surgery   COPD (chronic obstructive pulmonary disease) (HCC)    Coronary artery disease 2021   mild non obstructed   Diabetes mellitus without complication (HCC)    Enlarged heart    ESRD (end stage renal disease) (HCC)    TTHSAT   History of degenerative disc disease    Hyperlipidemia    Hypertension    NSTEMI (non-ST elevated myocardial infarction) (HCC)    Pneumonia    Sciatic nerve pain    Sleep apnea    Stroke (HCC)    no residual, x 3 last one was in 2020    Thoracic ascending aortic aneurysm (HCC)    4.2 cm 08/03/21 CTA chest    Past Surgical History:  Procedure Laterality Date   AV FISTULA PLACEMENT Left 07/17/2021   Procedure: CREATION  OF LEFT ARM RADIOCEPHALIC ARTERIOVENOUS (AV) FISTULA;  Surgeon: Margherita Shell, MD;  Location: [facility];  Service: Vascular;  Laterality: Left;   CARDIAC CATHETERIZATION     IR FLUORO GUIDE CV LINE RIGHT  05/20/2021   IR US  GUIDE VASC ACCESS RIGHT  05/20/2021   LIGATION OF COMPETING BRANCHES OF ARTERIOVENOUS FISTULA Left 09/13/2021   Procedure: LIGATION AND ELEVATION OF COMPETING BRANCHES OF LEFT ARM ARTERIOVENOUS FISTULA;  Surgeon: Margherita Shell, MD;  Location: [facility];  Service: Vascular;  Laterality: Left;    MEDICATIONS:  acetaminophen  (TYLENOL ) 500 MG tablet   albuterol  (PROVENTIL ) (2.5 MG/3ML) 0.083% nebulizer solution   albuterol  (PROVENTIL ) (2.5 MG/3ML) 0.083% nebulizer solution   amLODipine  (NORVASC ) 10 MG tablet   atorvastatin  (LIPITOR) 80 MG tablet   budesonide -formoterol  (SYMBICORT ) 160-4.5 MCG/ACT inhaler   calcium  acetate (PHOSLO ) 667 MG capsule   Calcium  Carbonate Antacid (TUMS PO)   carvedilol  (COREG ) 25 MG tablet   doxazosin  (CARDURA ) 4 MG tablet   doxazosin  (CARDURA ) 8 MG tablet   hydrALAZINE  (APRESOLINE ) 100 MG tablet   Insulin  Glargine (BASAGLAR  KWIKPEN) 100 UNIT/ML   insulin  lispro (HUMALOG ) 100 UNIT/ML injection   Insulin  Pen Needle 31G X 6 MM MISC   liraglutide  (VICTOZA ) 18 MG/3ML SOPN   Misc. Devices MISC   nicotine  (NICODERM CQ  - DOSED IN MG/24 HOURS) 14 mg/24hr patch   nitroGLYCERIN  (NITROSTAT ) 0.4 MG SL tablet   oxyCODONE -acetaminophen  (PERCOCET/ROXICET) 5-325 MG tablet   sertraline  (ZOLOFT ) 25 MG tablet   No current facility-administered medications for this encounter.     Chick Cotton Ward, PA-C [facility] [phone]

## 2023-06-15 NOTE — Progress Notes (Addendum)
 For Anesthesia: PCP - Senaida Dama, NP  Cardiologist - O'Neal, Cathay Clonts, MD  Clearance: Koren Persons: NP: 06/12/23: EPIC Bowel Prep reminder:  Chest x-ray - 05/25/23 EKG - 05/27/23 Stress Test -  ECHO - 12/25/22 Cardiac Cath -  Pacemaker/ICD device last checked: Pacemaker orders received: Device Rep notified:  Spinal Cord Stimulator:  Sleep Study - Yes CPAP - NO  Fasting Blood Sugar - N/A Checks Blood Sugar __4___ times a week Date and result of last Hgb A1c-  Last dose of GLP1 agonist- N/A GLP1 instructions:   Last dose of SGLT-2 inhibitors- N/A SGLT-2 instructions:   Blood Thinner Instructions: N/A Aspirin  Instructions: Last Dose:  Activity level: Can go up a flight of stairs and activities of daily living without stopping and without chest pain and/or shortness of breath     Unable to go up a flight of stairs without shortness of breath     Anesthesia review: Hx: HTN,DIA,COPD (uses 2L O2 at dialysis),OSA(NO CPAP),CAD,Stroke,MI,Smoker,Hemodialysis( T-T-S).  Patient denies shortness of breath, fever, cough and chest pain at PAT appointment   Patient verbalized understanding of instructions that were given to them at the PAT appointment. Patient was also instructed that they will need to review over the PAT instructions again at home before surgery.

## 2023-06-15 NOTE — Progress Notes (Signed)
Lab. Results:Hemoglobin: 9.4 

## 2023-06-15 NOTE — Telephone Encounter (Signed)
 Adding note from 06/11/2023, Spoke with patients wife, explained to her that her husband would need come in for surgical clearance, she stated they cannot come to [place] due to them being 4 hrs away patients wife stated she is not going to make an appointment just to get paperwork filled out. Patient stated she would take him to surgical appointment if they let him have the surgery then they will proceed if not she will reach out.

## 2023-06-16 DIAGNOSIS — Z992 Dependence on renal dialysis: Secondary | ICD-10-CM | POA: Diagnosis not present

## 2023-06-16 DIAGNOSIS — N186 End stage renal disease: Secondary | ICD-10-CM | POA: Diagnosis not present

## 2023-06-16 DIAGNOSIS — N2581 Secondary hyperparathyroidism of renal origin: Secondary | ICD-10-CM | POA: Diagnosis not present

## 2023-06-16 NOTE — Telephone Encounter (Signed)
Noted

## 2023-06-17 DIAGNOSIS — N2581 Secondary hyperparathyroidism of renal origin: Secondary | ICD-10-CM | POA: Diagnosis not present

## 2023-06-17 DIAGNOSIS — N186 End stage renal disease: Secondary | ICD-10-CM | POA: Diagnosis not present

## 2023-06-17 DIAGNOSIS — Z992 Dependence on renal dialysis: Secondary | ICD-10-CM | POA: Diagnosis not present

## 2023-06-17 NOTE — Progress Notes (Signed)
Updated date of surgery: 06-18-23  Updated time of arrival:10:15 am  Patient will be discharged from hospital and monitored at home for 24 hours by: Matthew Odom  [phone]  Patient denies any changes in allergies, medications, medical history since pre op appointment on:06-15-23  Pre op instructions reviewed, follow up questions addressed and patient verbalized understanding at this time.pt. VU

## 2023-06-18 ENCOUNTER — Other Ambulatory Visit: Payer: Self-pay

## 2023-06-18 ENCOUNTER — Ambulatory Visit (HOSPITAL_COMMUNITY): Payer: Self-pay | Admitting: Physician Assistant

## 2023-06-18 ENCOUNTER — Ambulatory Visit (HOSPITAL_COMMUNITY): Payer: Medicare HMO

## 2023-06-18 ENCOUNTER — Ambulatory Visit (HOSPITAL_COMMUNITY)
Admission: RE | Admit: 2023-06-18 | Discharge: 2023-06-18 | Disposition: A | Discharge status: Home / Self Care | Payer: Medicare HMO | Source: Ambulatory Visit | Attending: Orthopaedic Surgery | Admitting: Orthopaedic Surgery

## 2023-06-18 ENCOUNTER — Encounter (HOSPITAL_COMMUNITY): Payer: Self-pay | Admitting: Orthopaedic Surgery

## 2023-06-18 ENCOUNTER — Ambulatory Visit (HOSPITAL_COMMUNITY): Payer: Self-pay | Admitting: Anesthesiology

## 2023-06-18 ENCOUNTER — Encounter (HOSPITAL_COMMUNITY)
Admission: RE | Disposition: A | Discharge status: Home / Self Care | Payer: Self-pay | Source: Ambulatory Visit | Attending: Orthopaedic Surgery

## 2023-06-18 DIAGNOSIS — Z79899 Other long term (current) drug therapy: Secondary | ICD-10-CM | POA: Insufficient documentation

## 2023-06-18 DIAGNOSIS — G8918 Other acute postprocedural pain: Secondary | ICD-10-CM | POA: Diagnosis not present

## 2023-06-18 DIAGNOSIS — E669 Obesity, unspecified: Secondary | ICD-10-CM | POA: Insufficient documentation

## 2023-06-18 DIAGNOSIS — I509 Heart failure, unspecified: Secondary | ICD-10-CM | POA: Diagnosis not present

## 2023-06-18 DIAGNOSIS — I5042 Chronic combined systolic (congestive) and diastolic (congestive) heart failure: Secondary | ICD-10-CM | POA: Diagnosis not present

## 2023-06-18 DIAGNOSIS — F419 Anxiety disorder, unspecified: Secondary | ICD-10-CM | POA: Insufficient documentation

## 2023-06-18 DIAGNOSIS — F17210 Nicotine dependence, cigarettes, uncomplicated: Secondary | ICD-10-CM

## 2023-06-18 DIAGNOSIS — Z6837 Body mass index (BMI) 37.0-37.9, adult: Secondary | ICD-10-CM | POA: Insufficient documentation

## 2023-06-18 DIAGNOSIS — I2510 Atherosclerotic heart disease of native coronary artery without angina pectoris: Secondary | ICD-10-CM | POA: Insufficient documentation

## 2023-06-18 DIAGNOSIS — Z794 Long term (current) use of insulin: Secondary | ICD-10-CM | POA: Diagnosis not present

## 2023-06-18 DIAGNOSIS — I252 Old myocardial infarction: Secondary | ICD-10-CM | POA: Insufficient documentation

## 2023-06-18 DIAGNOSIS — J449 Chronic obstructive pulmonary disease, unspecified: Secondary | ICD-10-CM | POA: Diagnosis not present

## 2023-06-18 DIAGNOSIS — S82842A Displaced bimalleolar fracture of left lower leg, initial encounter for closed fracture: Secondary | ICD-10-CM | POA: Insufficient documentation

## 2023-06-18 DIAGNOSIS — S93432A Sprain of tibiofibular ligament of left ankle, initial encounter: Secondary | ICD-10-CM | POA: Insufficient documentation

## 2023-06-18 DIAGNOSIS — I10 Essential (primary) hypertension: Secondary | ICD-10-CM

## 2023-06-18 DIAGNOSIS — N186 End stage renal disease: Secondary | ICD-10-CM

## 2023-06-18 DIAGNOSIS — Z9981 Dependence on supplemental oxygen: Secondary | ICD-10-CM | POA: Insufficient documentation

## 2023-06-18 DIAGNOSIS — S82892A Other fracture of left lower leg, initial encounter for closed fracture: Secondary | ICD-10-CM | POA: Diagnosis not present

## 2023-06-18 DIAGNOSIS — G4730 Sleep apnea, unspecified: Secondary | ICD-10-CM | POA: Diagnosis not present

## 2023-06-18 DIAGNOSIS — I132 Hypertensive heart and chronic kidney disease with heart failure and with stage 5 chronic kidney disease, or end stage renal disease: Secondary | ICD-10-CM

## 2023-06-18 DIAGNOSIS — Z992 Dependence on renal dialysis: Secondary | ICD-10-CM | POA: Diagnosis not present

## 2023-06-18 DIAGNOSIS — E1122 Type 2 diabetes mellitus with diabetic chronic kidney disease: Secondary | ICD-10-CM | POA: Diagnosis not present

## 2023-06-18 DIAGNOSIS — Z8673 Personal history of transient ischemic attack (TIA), and cerebral infarction without residual deficits: Secondary | ICD-10-CM | POA: Diagnosis not present

## 2023-06-18 DIAGNOSIS — Z7985 Long-term (current) use of injectable non-insulin antidiabetic drugs: Secondary | ICD-10-CM | POA: Diagnosis not present

## 2023-06-18 DIAGNOSIS — W19XXXA Unspecified fall, initial encounter: Secondary | ICD-10-CM | POA: Insufficient documentation

## 2023-06-18 HISTORY — PX: SYNDESMOSIS REPAIR: SHX5182

## 2023-06-18 HISTORY — PX: ORIF ANKLE FRACTURE: SHX5408

## 2023-06-18 HISTORY — PX: ANKLE ARTHROSCOPY: SHX545

## 2023-06-18 LAB — POCT I-STAT, CHEM 8
BUN: 31 mg/dL — ABNORMAL HIGH (ref 6–20)
Calcium, Ion: 1.11 mmol/L — ABNORMAL LOW (ref 1.15–1.40)
Chloride: 98 mmol/L (ref 98–111)
Creatinine, Ser: 7.7 mg/dL — ABNORMAL HIGH (ref 0.61–1.24)
Glucose, Bld: 138 mg/dL — ABNORMAL HIGH (ref 70–99)
HCT: 32 % — ABNORMAL LOW (ref 39.0–52.0)
Hemoglobin: 10.9 g/dL — ABNORMAL LOW (ref 13.0–17.0)
Potassium: 3.2 mmol/L — ABNORMAL LOW (ref 3.5–5.1)
Sodium: 136 mmol/L (ref 135–145)
TCO2: 26 mmol/L (ref 22–32)

## 2023-06-18 LAB — GLUCOSE, CAPILLARY: Glucose-Capillary: 130 mg/dL — ABNORMAL HIGH (ref 70–99)

## 2023-06-18 SURGERY — ARTHROSCOPY, ANKLE
Anesthesia: Regional | Site: Ankle | Laterality: Left

## 2023-06-18 MED ORDER — CHLORHEXIDINE GLUCONATE 0.12 % MT SOLN
15.0000 mL | Freq: Once | OROMUCOSAL | Status: AC
  Administered 2023-06-18: 15 mL via OROMUCOSAL

## 2023-06-18 MED ORDER — ACETAMINOPHEN 500 MG PO TABS
1000.0000 mg | ORAL_TABLET | Freq: Once | ORAL | Status: AC
  Administered 2023-06-18: 1000 mg via ORAL
  Filled 2023-06-18: qty 2

## 2023-06-18 MED ORDER — PROPOFOL 10 MG/ML IV BOLUS
INTRAVENOUS | Status: AC
  Filled 2023-06-18: qty 20

## 2023-06-18 MED ORDER — HYDROMORPHONE HCL 1 MG/ML IJ SOLN: 0.2500 mg | INTRAMUSCULAR | Status: DC | PRN

## 2023-06-18 MED ORDER — OXYCODONE HCL 5 MG/5ML PO SOLN: 5.0000 mg | Freq: Once | ORAL | Status: DC | PRN

## 2023-06-18 MED ORDER — DEXAMETHASONE SODIUM PHOSPHATE 10 MG/ML IJ SOLN
INTRAMUSCULAR | Status: DC | PRN
  Administered 2023-06-18: 4 mg via INTRAVENOUS

## 2023-06-18 MED ORDER — LIDOCAINE HCL (PF) 2 % IJ SOLN
INTRAMUSCULAR | Status: AC
  Filled 2023-06-18: qty 5

## 2023-06-18 MED ORDER — PHENYLEPHRINE 80 MCG/ML (10ML) SYRINGE FOR IV PUSH (FOR BLOOD PRESSURE SUPPORT)
PREFILLED_SYRINGE | INTRAVENOUS | Status: AC
  Filled 2023-06-18: qty 10

## 2023-06-18 MED ORDER — SODIUM CHLORIDE 0.9 % IV SOLN: INTRAVENOUS | Status: DC

## 2023-06-18 MED ORDER — LACTATED RINGERS IV SOLN: INTRAVENOUS | Status: DC

## 2023-06-18 MED ORDER — DEXAMETHASONE SODIUM PHOSPHATE 10 MG/ML IJ SOLN
INTRAMUSCULAR | Status: AC
  Filled 2023-06-18: qty 1

## 2023-06-18 MED ORDER — POVIDONE-IODINE 10 % EX SWAB
2.0000 | Freq: Once | CUTANEOUS | Status: DC
Start: 2023-06-18 — End: 2023-06-18

## 2023-06-18 MED ORDER — MIDAZOLAM HCL 2 MG/2ML IJ SOLN
1.0000 mg | INTRAMUSCULAR | Status: DC
  Filled 2023-06-18: qty 2

## 2023-06-18 MED ORDER — ONDANSETRON HCL 4 MG/2ML IJ SOLN
INTRAMUSCULAR | Status: AC
  Filled 2023-06-18: qty 2

## 2023-06-18 MED ORDER — BUPIVACAINE HCL (PF) 0.5 % IJ SOLN
INTRAMUSCULAR | Status: AC
  Filled 2023-06-18: qty 30

## 2023-06-18 MED ORDER — ONDANSETRON HCL 4 MG/2ML IJ SOLN
INTRAMUSCULAR | Status: DC | PRN
  Administered 2023-06-18: 4 mg via INTRAVENOUS

## 2023-06-18 MED ORDER — PROPOFOL 10 MG/ML IV BOLUS
INTRAVENOUS | Status: DC | PRN
  Administered 2023-06-18: 200 mg via INTRAVENOUS

## 2023-06-18 MED ORDER — ROPIVACAINE HCL 5 MG/ML IJ SOLN
INTRAMUSCULAR | Status: DC | PRN
  Administered 2023-06-18: 25 mL via PERINEURAL
  Administered 2023-06-18: 20 mL via PERINEURAL

## 2023-06-18 MED ORDER — LIDOCAINE 2% (20 MG/ML) 5 ML SYRINGE
INTRAMUSCULAR | Status: DC | PRN
  Administered 2023-06-18: 60 mg via INTRAVENOUS

## 2023-06-18 MED ORDER — CEFAZOLIN SODIUM-DEXTROSE 2-4 GM/100ML-% IV SOLN
2.0000 g | INTRAVENOUS | Status: AC
  Administered 2023-06-18: 2 g via INTRAVENOUS
  Filled 2023-06-18: qty 100

## 2023-06-18 MED ORDER — PHENYLEPHRINE 80 MCG/ML (10ML) SYRINGE FOR IV PUSH (FOR BLOOD PRESSURE SUPPORT)
PREFILLED_SYRINGE | INTRAVENOUS | Status: DC | PRN
  Administered 2023-06-18 (×2): 80 ug via INTRAVENOUS

## 2023-06-18 MED ORDER — OXYCODONE HCL 5 MG PO TABS: 5.0000 mg | ORAL_TABLET | Freq: Once | ORAL | Status: DC | PRN

## 2023-06-18 MED ORDER — ONDANSETRON HCL 4 MG/2ML IJ SOLN: 4.0000 mg | Freq: Once | INTRAMUSCULAR | Status: DC | PRN

## 2023-06-18 MED ORDER — FENTANYL CITRATE PF 50 MCG/ML IJ SOSY
50.0000 ug | PREFILLED_SYRINGE | INTRAMUSCULAR | Status: DC
Start: 2023-06-18 — End: 2023-06-18
  Filled 2023-06-18: qty 2

## 2023-06-18 MED ORDER — ORAL CARE MOUTH RINSE: 15.0000 mL | Freq: Once | OROMUCOSAL | Status: AC

## 2023-06-18 MED ORDER — 0.9 % SODIUM CHLORIDE (POUR BTL) OPTIME
TOPICAL | Status: DC | PRN
  Administered 2023-06-18: 1000 mL

## 2023-06-18 MED ORDER — ASPIRIN 81 MG PO TBEC: DELAYED_RELEASE_TABLET | ORAL | 0 refills | Status: DC

## 2023-06-18 MED ORDER — OXYCODONE HCL 5 MG PO TABS: 5.0000 mg | ORAL_TABLET | ORAL | 0 refills | Status: DC | PRN

## 2023-06-18 SURGICAL SUPPLY — 55 items
BAG COUNTER SPONGE SURGICOUNT (BAG) IMPLANT
BANDAGE ESMARK 6X9 LF (GAUZE/BANDAGES/DRESSINGS) IMPLANT
BIT DRILL 2 CANN GRADUATED (BIT) IMPLANT
BIT DRILL 2.5 CANN STRL (BIT) IMPLANT
BIT DRILL 3 CANN ENDOSCOPIC (BIT) IMPLANT
BLADE SURG 15 STRL LF DISP TIS (BLADE) ×3 IMPLANT
BNDG COHESIVE 4X5 TAN STRL LF (GAUZE/BANDAGES/DRESSINGS) IMPLANT
BNDG ELASTIC 4INX 5YD STR LF (GAUZE/BANDAGES/DRESSINGS) IMPLANT
BNDG ELASTIC 6INX 5YD STR LF (GAUZE/BANDAGES/DRESSINGS) ×6 IMPLANT
BNDG ESMARK 6X9 LF (GAUZE/BANDAGES/DRESSINGS)
CHLORAPREP W/TINT 26 (MISCELLANEOUS) ×3 IMPLANT
COVER MAYO STAND STRL (DRAPES) IMPLANT
CUFF TRNQT CYL 34X4.125X (TOURNIQUET CUFF) ×3 IMPLANT
DISSECTOR 3.8MM X 13CM (MISCELLANEOUS) IMPLANT
DRAPE C-ARM 42X120 X-RAY (DRAPES) IMPLANT
DRAPE C-ARMOR (DRAPES) IMPLANT
DRAPE U-SHAPE 47X51 STRL (DRAPES) ×3 IMPLANT
ELECT REM PT RETURN 15FT ADLT (MISCELLANEOUS) ×3 IMPLANT
GAUZE SPONGE 4X4 12PLY STRL (GAUZE/BANDAGES/DRESSINGS) ×3 IMPLANT
GAUZE XEROFORM 1X8 LF (GAUZE/BANDAGES/DRESSINGS) ×3 IMPLANT
GLOVE BIOGEL PI IND STRL 8 (GLOVE) ×3 IMPLANT
GLOVE SURG LX STRL 7.5 STRW (GLOVE) ×3 IMPLANT
GOWN STRL REUS W/ TWL XL LVL3 (GOWN DISPOSABLE) ×3 IMPLANT
KIT BASIN OR (CUSTOM PROCEDURE TRAY) ×3 IMPLANT
KIT TURNOVER KIT A (KITS) IMPLANT
NS IRRIG 1000ML POUR BTL (IV SOLUTION) ×3 IMPLANT
PACK ORTHO EXTREMITY (CUSTOM PROCEDURE TRAY) ×3 IMPLANT
PAD CAST 4YDX4 CTTN HI CHSV (CAST SUPPLIES) ×3 IMPLANT
PADDING CAST SYNTHETIC 4X4 STR (CAST SUPPLIES) ×6 IMPLANT
PENCIL SMOKE EVACUATOR (MISCELLANEOUS) ×3 IMPLANT
PLATE LOCK DIST FIB 5H TTNIUM (Plate) IMPLANT
SCREW ANKLE FT 3.5X60 (Screw) IMPLANT
SCREW LO-PRO TI 3.5X16MM (Screw) IMPLANT
SCREW LOCK COMP 3X16 (Screw) IMPLANT
SCREW LP CORT 3.0X24 (Screw) IMPLANT
SCREW LP TI 3.5X14MM (Screw) IMPLANT
SCREW VAL KREULOCK 3.0X14 TI (Screw) IMPLANT
SCREW VAL KREULOCK 3.0X18 TI (Screw) IMPLANT
SCREW VAL KREULOCK 3X20 (Screw) IMPLANT
SPIKE FLUID TRANSFER (MISCELLANEOUS) IMPLANT
SPLINT PLASTER CAST FAST 5X30 (CAST SUPPLIES) IMPLANT
SPLINT PLASTER CAST XFAST 5X30 (CAST SUPPLIES) ×60 IMPLANT
STAPLER SKIN PROX WIDE 3.9 (STAPLE) IMPLANT
STRAP ANKLE DISTRACTOR (MISCELLANEOUS) IMPLANT
STRIP CLOSURE SKIN 1/2X4 (GAUZE/BANDAGES/DRESSINGS) IMPLANT
SUCTION TUBE FRAZIER 10FR DISP (SUCTIONS) ×3 IMPLANT
SUT ETHILON 3 0 PS 1 (SUTURE) IMPLANT
SUT MNCRL AB 3-0 PS2 18 (SUTURE) IMPLANT
SUT PDS AB 2-0 CT2 27 (SUTURE) IMPLANT
SUT VIC AB 2-0 SH 27XBRD (SUTURE) IMPLANT
SUT VIC AB 3-0 FS2 27 (SUTURE) ×3 IMPLANT
TOWEL OR 17X26 10 PK STRL BLUE (TOWEL DISPOSABLE) ×3 IMPLANT
TUBING CONNECTING 10 (TUBING) IMPLANT
UNDERPAD 30X36 HEAVY ABSORB (UNDERPADS AND DIAPERS) ×3 IMPLANT
YANKAUER SUCT BULB TIP 10FT TU (MISCELLANEOUS) ×3 IMPLANT

## 2023-06-18 NOTE — Transfer of Care (Signed)
Immediate Anesthesia Transfer of Care Note  Patient: Matthew Odom  Procedure(s) Performed: LEFT ANKLE ARTHROSCOPIC DEBRIDEMENT (Left: Ankle) SYNDESMOSIS REPAIR (Left) OPEN TREATMENT LATERAL MALLEOLUS (Left: Ankle)  Patient Location: PACU  Anesthesia Type:General  Level of Consciousness: sedated, patient cooperative, and responds to stimulation  Airway & Oxygen Therapy: Patient Spontanous Breathing and Patient connected to face mask oxygen  Post-op Assessment: Report given to RN and Post -op Vital signs reviewed and stable  Post vital signs: Reviewed and stable  Last Vitals:  Vitals Value Taken Time  BP 171/93 06/18/23 1447  Temp    Pulse 79 06/18/23 1450  Resp 13 06/18/23 1450  SpO2 100 % 06/18/23 1450  Vitals shown include unfiled device data.  Last Pain:  Vitals:   06/18/23 1037  TempSrc:   PainSc: 0-No pain         Complications: No notable events documented.

## 2023-06-18 NOTE — Anesthesia Procedure Notes (Addendum)
Procedure Name: LMA Insertion Date/Time: 06/18/2023 1:26 PM  Performed by: Sindy Guadeloupe, CRNAPre-anesthesia Checklist: Patient identified, Emergency Drugs available, Suction available, Patient being monitored and Timeout performed Patient Re-evaluated:Patient Re-evaluated prior to induction Oxygen Delivery Method: Circle system utilized Preoxygenation: Pre-oxygenation with 100% oxygen Induction Type: IV induction Ventilation: Mask ventilation without difficulty LMA: LMA inserted LMA Size: 5.0 Number of attempts: 1 Tube secured with: Tape Dental Injury: Teeth and Oropharynx as per pre-operative assessment

## 2023-06-18 NOTE — Anesthesia Procedure Notes (Signed)
Anesthesia Regional Block: Popliteal block   Pre-Anesthetic Checklist: , timeout performed,  Correct Patient, Correct Site, Correct Laterality,  Correct Procedure, Correct Position, site marked,  Risks and benefits discussed,  Surgical consent,  Pre-op evaluation,  At surgeon's request and post-op pain management  Laterality: Left  Prep: chloraprep       Needles:  Injection technique: Single-shot  Needle Type: Echogenic Stimulator Needle     Needle Length: 9cm  Needle Gauge: 21     Additional Needles:   Procedures:,,,, ultrasound used (permanent image in chart),,    Narrative:  Start time: 06/18/2023 12:00 PM End time: 06/18/2023 12:05 PM Injection made incrementally with aspirations every 5 mL.  Performed by: Personally  Anesthesiologist: Linton Rump, MD  Additional Notes: Discussed risks and benefits of nerve block including, but not limited to, prolonged and/or permanent nerve injury involving sensory and/or motor function. Monitors were applied and a time-out was performed. The nerve and associated structures were visualized under ultrasound guidance. After negative aspiration, local anesthetic was slowly injected around the nerve. There was no evidence of high pressure during the procedure. There were no paresthesias. VSS remained stable and the patient tolerated the procedure well.

## 2023-06-18 NOTE — Anesthesia Procedure Notes (Signed)
Anesthesia Regional Block: Adductor canal block   Pre-Anesthetic Checklist: , timeout performed,  Correct Patient, Correct Site, Correct Laterality,  Correct Procedure, Correct Position, site marked,  Risks and benefits discussed,  Surgical consent,  Pre-op evaluation,  At surgeon's request and post-op pain management  Laterality: Left  Prep: chloraprep       Needles:  Injection technique: Single-shot  Needle Type: Echogenic Stimulator Needle     Needle Length: 9cm  Needle Gauge: 21     Additional Needles:   Procedures:,,,, ultrasound used (permanent image in chart),,    Narrative:  Start time: 06/18/2023 12:06 PM End time: 06/18/2023 12:09 PM Injection made incrementally with aspirations every 5 mL.  Performed by: Personally  Anesthesiologist: Linton Rump, MD  Additional Notes: Discussed risks and benefits of nerve block including, but not limited to, prolonged and/or permanent nerve injury involving sensory and/or motor function. Monitors were applied and a time-out was performed. The nerve and associated structures were visualized under ultrasound guidance. After negative aspiration, local anesthetic was slowly injected around the nerve. There was no evidence of high pressure during the procedure. There were no paresthesias. VSS remained stable and the patient tolerated the procedure well.

## 2023-06-18 NOTE — H&P (Signed)
PREOPERATIVE H&P  Chief Complaint: Left ankle pain  HPI: Matthew Odom is a 53 y.o. male who presents for preoperative history and physical with a diagnosis of left bimalleolar equivalent ankle fracture with possible syndesmosis disruption.. Symptoms are rated as moderate to severe, and have been worsening.  This is significantly impairing activities of daily living.  He has elected for surgical management.   Past Medical History:  Diagnosis Date   Anxiety    BPH (benign prostatic hyperplasia)    Cocaine use    07/15/21- "last time was more than 10 years ago."   Complication of anesthesia    wakes up during surgery   COPD (chronic obstructive pulmonary disease) (HCC)    Coronary artery disease 2021   mild non obstructed   Diabetes mellitus without complication (HCC)    Enlarged heart    ESRD (end stage renal disease) (HCC)    TTHSAT   History of degenerative disc disease    Hyperlipidemia    Hypertension    NSTEMI (non-ST elevated myocardial infarction) (HCC)    Pneumonia    Sciatic nerve pain    Sleep apnea    Stroke (HCC)    no residual, x 3 last one was in 2020   Thoracic ascending aortic aneurysm (HCC)    4.2 cm 08/03/21 CTA chest   Past Surgical History:  Procedure Laterality Date   AV FISTULA PLACEMENT Left 07/17/2021   Procedure: CREATION  OF LEFT ARM RADIOCEPHALIC ARTERIOVENOUS (AV) FISTULA;  Surgeon: Nada Libman, MD;  Location[facility];  Service: Vascular;  Laterality: Left;   CARDIAC CATHETERIZATION     IR FLUORO GUIDE CV LINE RIGHT  05/20/2021   IR US GUIDE VASC ACCESS RIGHT  05/20/2021   LIGATION OF COMPETING BRANCHES OF ARTERIOVENOUS FISTULA Left 09/13/2021   Procedure: LIGATION AND ELEVATION OF COMPETING BRANCHES OF LEFT ARM ARTERIOVENOUS FISTULA;  Surgeon: Nada Libman, MD;  Location: [facility];  Service: Vascular;  Laterality: Left;   Social History   Socioeconomic History   Marital status: Married    Spouse name: Not on file   Number of children: 7    Years of education: Not on file   Highest education level: Not on file  Occupational History   Occupation: disabled  Tobacco Use   Smoking status: Every Day    Current packs/day: 0.50    Average packs/day: 0.5 packs/day for 40.0 years (20.0 ttl pk-yrs)    Types: Cigarettes   Smokeless tobacco: Never   Tobacco comments:    Started smoking again in April.  1/2 ppd.    Vaping Use   Vaping status: Never Used  Substance and Sexual Activity   Alcohol use: Not Currently    Comment: very little   Drug use: Not Currently    Comment: "years ago" per patient   Sexual activity: Yes  Other Topics Concern   Not on file  Social History Narrative   Not on file   Social Drivers of Health   Financial Resource Strain: Not on file  Food Insecurity: Not on file  Transportation Needs: Not on file  Physical Activity: Not on file  Stress: Not on file  Social Connections: Unknown (09/13/2021)   Received from [facility], [facility]   Social Network    Social Network: Not on file   Family History  Problem Relation Age of Onset   Heart attack Mother    Hypertension Mother    Diabetes Mother    Heart disease  Sister    Hypertension Sister    Hypertension Father    Emphysema Father    Allergies  Allergen Reactions   Aspirin Hives   Nitroglycerin Other (See Comments)    Nitroglycerin paste or IV will make blood pressure go up and cause headaches. The nitroglycerin tablets do fine.   Prior to Admission medications   Medication Sig Start Date End Date Taking? Authorizing Provider  acetaminophen (TYLENOL) 500 MG tablet Take 500-1,000 mg by mouth every 6 (six) hours as needed (pain.).   Yes [provider]  albuterol (PROVENTIL) (2.5 MG/3ML) 0.083% nebulizer solution Take 3 mLs (2.5 mg total) by nebulization every 6 (six) hours as needed for wheezing or shortness of breath. 01/14/23  Yes Zonia Kief, Amy J, NP  amLODipine (NORVASC) 10 MG tablet Take 1 tablet (10 mg total) by mouth  daily. 12/05/22  Yes O'Neal, Ronnald Ramp, MD  budesonide-formoterol [facility]) 160-4.5 MCG/ACT inhaler Inhale 2 puffs into the lungs 2 (two) times daily. 01/14/23  Yes Rema Fendt, NP  calcium acetate (PHOSLO) 667 MG capsule Take 2 capsules (1,334 mg total) by mouth 3 (three) times daily with meals 06/25/21  Yes   Calcium Carbonate Antacid (TUMS PO) Take 2 tablets by mouth 3 (three) times daily with meals.   Yes [provider]  carvedilol (COREG) 25 MG tablet Take 1 tablet (25 mg total) by mouth 2 (two) times daily with a meal. 12/05/22  Yes O'Neal, Ronnald Ramp, MD  doxazosin (CARDURA) 4 MG tablet Take 12 mg by mouth at bedtime.   Yes [provider]  hydrALAZINE (APRESOLINE) 100 MG tablet Take 1 tablet (100 mg total) by mouth 3 (three) times daily. 12/05/22  Yes O'Neal, Ronnald Ramp, MD  Insulin Pen Needle 31G X 6 MM MISC Use as directed in the morning, at noon, in the evening, and at bedtime. 06/19/21  Yes Shamleffer, Konrad Dolores, MD  liraglutide (VICTOZA) 18 MG/3ML SOPN Inject 1.8 mg into the skin daily. 06/19/21  Yes Shamleffer, Konrad Dolores, MD  nitroGLYCERIN (NITROSTAT) 0.4 MG SL tablet Place 1 tablet (0.4 mg total) under the tongue every 5 (five) minutes as needed for up to 30 doses for chest pain. 07/16/21  Yes Trifan, Kermit Balo, MD  oxyCODONE-acetaminophen (PERCOCET/ROXICET) 5-325 MG tablet Take 1 tablet by mouth every 6 (six) hours as needed for severe pain. 01/18/23  Yes Gwyneth Sprout, MD  sertraline (ZOLOFT) 25 MG tablet Take 1 tablet (25 mg total) by mouth daily. 01/14/23  Yes Zonia Kief, Amy J, NP  albuterol (PROVENTIL) (2.5 MG/3ML) 0.083% nebulizer solution Take 3 mLs (2.5 mg total) by nebulization every 6 (six) hours as needed for wheezing or shortness of breath. Patient not taking: Reported on 06/15/2023 05/25/23   Wynetta Fines, MD  atorvastatin (LIPITOR) 80 MG tablet Take 1 tablet (80 mg total) by mouth daily. Patient not taking: Reported on 06/15/2023  11/13/21 01/14/23  Kathlen Mody, MD  doxazosin (CARDURA) 8 MG tablet Take 1 tablet (8 mg total) by mouth daily. Patient not taking: Reported on 06/15/2023 12/05/22   Sande Rives, MD  Insulin Glargine [facility]) 100 UNIT/ML Inject 20 Units into the skin daily. Patient taking differently: Inject 10 Units into the skin 2 (two) times daily. 06/19/21   Shamleffer, Konrad Dolores, MD  insulin lispro (HUMALOG) 100 UNIT/ML injection Inject 0-4 Units into the skin 3 (three) times daily as needed for high blood sugar.    [provider]  Misc. Devices MISC Please provide patient with insurance  approved CPAP with the following settings:  autopap 15-20.  Large size [employer] Full Face Mask Forma mask and heated humidification. 12/25/19   Claiborne Rigg, NP  nicotine (NICODERM CQ - DOSED IN MG/24 HOURS) 14 mg/24hr patch Place 1 patch (14 mg total) onto the skin daily as needed. Patient not taking: Reported on 06/15/2023 01/14/23   Rema Fendt, NP     Positive ROS: All other systems have been reviewed and were otherwise negative with the exception of those mentioned in the HPI and as above.  Physical Exam:  Vitals:   06/18/23 1031  BP: (!) 177/97  Pulse: 74  Resp: 18  Temp: 97.9 F (36.6 C)  SpO2: 96%   General: Alert, no acute distress Cardiovascular: No pedal edema Respiratory: No cyanosis, no use of accessory musculature GI: No organomegaly, abdomen is soft and non-tender Skin: No lesions in the area of chief complaint Neurologic: Sensation intact distally Psychiatric: Patient is competent for consent with normal mood and affect Lymphatic: No axillary or cervical lymphadenopathy  MUSCULOSKELETAL: Left ankle in a short leg splint.  It is well aligned.  No tenderness distal or proximal to the splint.  Sensation intact about the toes.  Foot is warm and well-perfused.  Assessment: Left bimalleolar equivalent ankle fracture.    Plan: Plan for left ankle  arthroscopy with open treatment of his fibula and possible syndesmosis.  He likely has interposed tissue laterally and medially about the ankle joint that requires the arthroscopy to clear out the joint of the distal tissue and to assess the cartilage surfaces and impingement.  Patient will be discharged from the PACU.  We discussed the risks, benefits and alternatives of surgery which include but are not limited to wound healing complications, infection, nonunion, malunion, need for further surgery, damage to surrounding structures and continued pain.  They understand there is no guarantees to an acceptable outcome.  After weighing these risks they opted to proceed with surgery.     Terance Hart, MD    06/18/2023 11:51 AM

## 2023-06-18 NOTE — Anesthesia Postprocedure Evaluation (Signed)
Anesthesia Post Note  Patient: Matthew Odom  Procedure(s) Performed: LEFT ANKLE ARTHROSCOPIC DEBRIDEMENT (Left: Ankle) SYNDESMOSIS REPAIR (Left) OPEN TREATMENT LATERAL MALLEOLUS (Left: Ankle)     Patient location during evaluation: PACU Anesthesia Type: Regional and General Level of consciousness: awake Pain management: pain level controlled Vital Signs Assessment: post-procedure vital signs reviewed and stable Respiratory status: spontaneous breathing, nonlabored ventilation and respiratory function stable Cardiovascular status: blood pressure returned to baseline and stable Postop Assessment: no apparent nausea or vomiting Anesthetic complications: no   No notable events documented.  Last Vitals:  Vitals:   06/18/23 1530 06/18/23 1545  BP: (!) 159/97 (!) 145/95  Pulse: 78 75  Resp: (!) 21 16  Temp:  36.6 C  SpO2: 94% 100%    Last Pain:  Vitals:   06/18/23 1545  TempSrc:   PainSc: 0-No pain                 Linton Rump

## 2023-06-18 NOTE — Discharge Instructions (Signed)
DR. Susa Simmonds FOOT & ANKLE SURGERY POST-OP INSTRUCTIONS   Pain Management The numbing medicine and your leg will last around 18 hours, take a dose of your pain medicine as soon as you feel it wearing off to avoid rebound pain. Keep your foot elevated above heart level.  Make sure that your heel hangs free ('floats'). Take all prescribed medication as directed. If taking narcotic pain medication you may want to use an over-the-counter stool softener to avoid constipation. You may take over-the-counter NSAIDs (ibuprofen, naproxen, etc.) as well as over-the-counter acetaminophen as directed on the packaging as a supplement for your pain and may also use it to wean away from the prescription medication.  Activity Non-weightbearing Keep splint intact  First Postoperative Visit Your first postop visit will be at least 2 weeks after surgery.  This should be scheduled when you schedule surgery. If you do not have a postoperative visit scheduled please call [phone] to schedule an appointment. At the appointment your incision will be evaluated for suture removal, x-rays will be obtained if necessary.  General Instructions Swelling is very common after foot and ankle surgery.  It often takes 3 months for the foot and ankle to begin to feel comfortable.  Some amount of swelling will persist for 6-12 months. DO NOT change the dressing.  If there is a problem with the dressing (too tight, loose, gets wet, etc.) please contact Dr. Donnie Mesa office. DO NOT get the dressing wet.  For showers you can use an over-the-counter cast cover or wrap a washcloth around the top of your dressing and then cover it with a plastic bag and tape it to your leg. DO NOT soak the incision (no tubs, pools, bath, etc.) until you have approval from Dr. Susa Simmonds.  Contact Dr. Garret Reddish office or go to Emergency Room if: Temperature above 101 F. Increasing pain that is unresponsive to pain medication or elevation Excessive redness or  swelling in your foot Dressing problems - excessive bloody drainage, looseness or tightness, or if dressing gets wet Develop pain, swelling, warmth, or discoloration of your calf

## 2023-06-19 ENCOUNTER — Encounter (HOSPITAL_COMMUNITY): Payer: Self-pay | Admitting: Orthopaedic Surgery

## 2023-06-23 DIAGNOSIS — N186 End stage renal disease: Secondary | ICD-10-CM | POA: Diagnosis not present

## 2023-06-23 DIAGNOSIS — Z992 Dependence on renal dialysis: Secondary | ICD-10-CM | POA: Diagnosis not present

## 2023-06-23 DIAGNOSIS — N2581 Secondary hyperparathyroidism of renal origin: Secondary | ICD-10-CM | POA: Diagnosis not present

## 2023-06-24 NOTE — Op Note (Signed)
Matthew Odom male 53 y.o. 06/18/2023  PreOperative Diagnosis: Left bimalleolar equivalent ankle fracture Syndesmosis disruption  PostOperative Diagnosis: same  PROCEDURE: Left ankle arthroscopic debridement, extensive Open treatment of left lateral malleolus fracture Open treatment of syndesmosis  SURGEON: Dub Mikes, MD  ASSISTANT: Jesse [place], PA-C was necessary for patient positioning, prep, drape and assistance with fracture reduction as well as arthroscopic instruments  ANESTHESIA: General with peripheral nerve block  FINDINGS: See below  IMPLANTS: Arthrex distal fibular locking plate, fully threaded screws  INDICATIONS:52 y.o. malesustained the above injury after a fall.  He had fracture of the fibula noted on x-ray with medial clear space widening and concern for syndesmosis disruption.  There is also concern for intra-articular cartilage damage and fracturing and therefore the patient was indicated for arthroscopy and treatment of his fractures and joints   Patient understood the risks, benefits and alternatives to surgery which include but are not limited to wound healing complications, infection, nonunion, malunion, need for further surgery as well as damage to surrounding structures. They also understood the potential for continued pain in that there were no guarantees of acceptable outcome After weighing these risks the patient opted to proceed with surgery.  PROCEDURE: Patient was identified in the preoperative holding area.  The left ankle was marked by myself.  Consent was signed by myself and the patient.  Block was performed by anesthesia in the preoperative holding area.  Patient was taken to the operative suite and placed supine on the operative table.  General anesthesia was induced without difficulty. Bump was placed under the operative hip and bone foam was used.  All bony prominences were well padded.  Tourniquet was placed on the operative thigh.   Preoperative antibiotics were given. The extremity was prepped and draped in the usual sterile fashion and surgical timeout was performed.  The limb was elevated and the tourniquet was inflated to 250 mmHg.  Left ankle arthroscopic debridement: We began by insufflating the ankle joint with normal saline using an 18-gauge needle.  We then proceeded by making an anteromedial portal to the ankle joint.  This was done with an 11 blade through the skin.  Then blunt dissection was used with a hemostat down to the capsule.  Then the capsule was violated and the joint entered.  Then the trocar with the camera was placed.  There was a large amount of fracture hematoma and free-floating cartilage and bony piece within the ankle joint.   Then a lateral portal was placed in a similar fashion.  The probe was placed to mobilize and displaced the tissue and the joint surfaces were inspected.    there was cartilage delamination anteriorly about the tibia as well as free-floating cartilage and bony fragments.  There is evidence of deltoid ligament disruption medially with some interposed tissue.  There was significant amount of fibrous consolidated hematoma within the joint. Then the probe was removed and the shaver was inserted.  Extensive debridement was done of the ankle joint including synovial tissue and evidence of chondral impingement on the anterior distal tibial plafond.    The joint surfaces were inspected for any evidence of loose cartilage.  There was some remaining chondrosis notably within the lateral gutter and of the lateral anterior tibial plafond.  There is also some synovitis within the syndesmosis that was debrided back. This was debrided extensively.debridement was carried out on synovial tissue, cartilage, bone and ligamentous tissue that had been disrupted from the fracture and ligamentous injury.  After  acceptable debridement the joint was reinspected.  It was acceptable. This completed the arthroscopic  debridement portion of the case.   Open treatment of fibular fracture with internal fixation: We then turned our attention to the lateral malleolus.  A lateral incision was made overlying the fibula.  It was carried sharply through skin and subcu tissue.  Blunt dissection was used to mobilize skin flaps.  Care was taken to identify a branch of the superficial peroneal nerve which were not identified.  Then the fracture was identified and mobilized.  It was cleared out using a rongeur and a curette.  Then the fracture was reduced under direct visualization held provisionally with a lobster clamp.  Lag screw was placed.  Distal fibular locking plate was placed with a combination of locking and nonlocking screws.  Fluoroscopy confirmed acceptable fracture reduction.  Then we proceeded with fluoroscopy.  The ankle was then stressed using fluoroscopic guidance.  There was notable syndesmosis widening.  We proceeded to open treatment of the syndesmosis.  Open treatment of syndesmosis: Separate deep incision was carried anterior to the fibula at the level of the syndesmosis.  There is disruption of the anterior distal tibiofibular ligaments.  The syndesmosis was then mobilized and cleared using a rongeur.  Then the syndesmosis was reduced under direct position and held provisionally with a Weber clamp.  Fluoroscopy confirmed except reduction of the mortise and syndesmosis.  Then a single fully threaded screw was placed across the syndesmosis to stabilize it.  After placement of the screw it was stable.   The leg was cleaned and the wounds were covered with Xeroform and a soft dressing.   A nonweightbearing short leg splint was placed.  They were awakened from anesthesia and taken recovery in stable condition.  All counts were correct at the end the case.  There was no complications.   POST OPERATIVE INSTRUCTIONS: Keep splint dry and in place Nonweightbearing to right lower extremity Call the office with  concerns Follow-up in 2 weeks for splint removal, suture removal and likely placement of a nonweightbearing cast. aspirin for DVT prophylaxis unless otherwise treated   TOURNIQUET TIME:less than 2 hours  BLOOD LOSS:  Minimal         DRAINS: none         SPECIMEN: none       COMPLICATIONS:  * No complications entered in OR log *         Disposition: PACU - hemodynamically stable.         Condition: stable

## 2023-06-25 DIAGNOSIS — N186 End stage renal disease: Secondary | ICD-10-CM | POA: Diagnosis not present

## 2023-06-25 DIAGNOSIS — Z992 Dependence on renal dialysis: Secondary | ICD-10-CM | POA: Diagnosis not present

## 2023-06-25 DIAGNOSIS — N2581 Secondary hyperparathyroidism of renal origin: Secondary | ICD-10-CM | POA: Diagnosis not present

## 2023-06-29 ENCOUNTER — Emergency Department (HOSPITAL_COMMUNITY)
Admission: EM | Admit: 2023-06-29 | Discharge: 2023-06-29 | Disposition: A | Discharge status: Home / Self Care | Payer: Medicare HMO | Attending: Emergency Medicine | Admitting: Emergency Medicine

## 2023-06-29 ENCOUNTER — Encounter (HOSPITAL_COMMUNITY): Payer: Self-pay | Admitting: Emergency Medicine

## 2023-06-29 ENCOUNTER — Emergency Department (HOSPITAL_COMMUNITY): Payer: Medicare HMO

## 2023-06-29 ENCOUNTER — Other Ambulatory Visit: Payer: Self-pay

## 2023-06-29 DIAGNOSIS — N186 End stage renal disease: Secondary | ICD-10-CM | POA: Insufficient documentation

## 2023-06-29 DIAGNOSIS — I509 Heart failure, unspecified: Secondary | ICD-10-CM | POA: Insufficient documentation

## 2023-06-29 DIAGNOSIS — Z7951 Long term (current) use of inhaled steroids: Secondary | ICD-10-CM | POA: Insufficient documentation

## 2023-06-29 DIAGNOSIS — R7989 Other specified abnormal findings of blood chemistry: Secondary | ICD-10-CM | POA: Insufficient documentation

## 2023-06-29 DIAGNOSIS — Z992 Dependence on renal dialysis: Secondary | ICD-10-CM | POA: Insufficient documentation

## 2023-06-29 DIAGNOSIS — Z794 Long term (current) use of insulin: Secondary | ICD-10-CM | POA: Insufficient documentation

## 2023-06-29 DIAGNOSIS — R000 Tachycardia, unspecified: Secondary | ICD-10-CM | POA: Diagnosis not present

## 2023-06-29 DIAGNOSIS — I499 Cardiac arrhythmia, unspecified: Secondary | ICD-10-CM | POA: Diagnosis not present

## 2023-06-29 DIAGNOSIS — J441 Chronic obstructive pulmonary disease with (acute) exacerbation: Secondary | ICD-10-CM | POA: Diagnosis not present

## 2023-06-29 DIAGNOSIS — Z7982 Long term (current) use of aspirin: Secondary | ICD-10-CM | POA: Diagnosis not present

## 2023-06-29 DIAGNOSIS — E1122 Type 2 diabetes mellitus with diabetic chronic kidney disease: Secondary | ICD-10-CM | POA: Insufficient documentation

## 2023-06-29 DIAGNOSIS — D72829 Elevated white blood cell count, unspecified: Secondary | ICD-10-CM | POA: Diagnosis not present

## 2023-06-29 DIAGNOSIS — R0602 Shortness of breath: Secondary | ICD-10-CM | POA: Diagnosis not present

## 2023-06-29 DIAGNOSIS — I10 Essential (primary) hypertension: Secondary | ICD-10-CM | POA: Diagnosis not present

## 2023-06-29 DIAGNOSIS — R918 Other nonspecific abnormal finding of lung field: Secondary | ICD-10-CM | POA: Diagnosis not present

## 2023-06-29 DIAGNOSIS — I517 Cardiomegaly: Secondary | ICD-10-CM | POA: Diagnosis not present

## 2023-06-29 DIAGNOSIS — R4702 Dysphasia: Secondary | ICD-10-CM | POA: Diagnosis not present

## 2023-06-29 LAB — CBC WITH DIFFERENTIAL/PLATELET
Abs Immature Granulocytes: 0.05 10*3/uL (ref 0.00–0.07)
Basophils Absolute: 0 10*3/uL (ref 0.0–0.1)
Basophils Relative: 0 %
Eosinophils Absolute: 0.2 10*3/uL (ref 0.0–0.5)
Eosinophils Relative: 1 %
HCT: 32.2 % — ABNORMAL LOW (ref 39.0–52.0)
Hemoglobin: 9.8 g/dL — ABNORMAL LOW (ref 13.0–17.0)
Immature Granulocytes: 0 %
Lymphocytes Relative: 27 %
Lymphs Abs: 3 10*3/uL (ref 0.7–4.0)
MCH: 27.9 pg (ref 26.0–34.0)
MCHC: 30.4 g/dL (ref 30.0–36.0)
MCV: 91.7 fL (ref 80.0–100.0)
Monocytes Absolute: 0.9 10*3/uL (ref 0.1–1.0)
Monocytes Relative: 8 %
Neutro Abs: 7.3 10*3/uL (ref 1.7–7.7)
Neutrophils Relative %: 64 %
Platelets: 297 10*3/uL (ref 150–400)
RBC: 3.51 MIL/uL — ABNORMAL LOW (ref 4.22–5.81)
RDW: 15.3 % (ref 11.5–15.5)
WBC: 11.4 10*3/uL — ABNORMAL HIGH (ref 4.0–10.5)
nRBC: 0 % (ref 0.0–0.2)

## 2023-06-29 LAB — BASIC METABOLIC PANEL
Anion gap: 20 — ABNORMAL HIGH (ref 5–15)
BUN: 75 mg/dL — ABNORMAL HIGH (ref 6–20)
CO2: 22 mmol/L (ref 22–32)
Calcium: 8.6 mg/dL — ABNORMAL LOW (ref 8.9–10.3)
Chloride: 94 mmol/L — ABNORMAL LOW (ref 98–111)
Creatinine, Ser: 11.36 mg/dL — ABNORMAL HIGH (ref 0.61–1.24)
GFR, Estimated: 5 mL/min — ABNORMAL LOW (ref >60)
Glucose, Bld: 155 mg/dL — ABNORMAL HIGH (ref 70–99)
Potassium: 4 mmol/L (ref 3.5–5.1)
Sodium: 136 mmol/L (ref 135–145)

## 2023-06-29 LAB — BRAIN NATRIURETIC PEPTIDE: B Natriuretic Peptide: 974.3 pg/mL — ABNORMAL HIGH (ref 0.0–100.0)

## 2023-06-29 LAB — RESP PANEL BY RT-PCR (RSV, FLU A&B, COVID)  RVPGX2
Influenza A by PCR: NEGATIVE
Influenza B by PCR: NEGATIVE
Resp Syncytial Virus by PCR: NEGATIVE
SARS Coronavirus 2 by RT PCR: NEGATIVE

## 2023-06-29 LAB — TROPONIN I (HIGH SENSITIVITY)
Troponin I (High Sensitivity): 107 ng/L (ref <18)
Troponin I (High Sensitivity): 113 ng/L (ref <18)

## 2023-06-29 MED ORDER — ALBUTEROL SULFATE (2.5 MG/3ML) 0.083% IN NEBU
10.0000 mg/h | INHALATION_SOLUTION | Freq: Once | RESPIRATORY_TRACT | Status: AC
  Administered 2023-06-29: 10 mg/h via RESPIRATORY_TRACT
  Filled 2023-06-29: qty 12

## 2023-06-29 MED ORDER — PREDNISONE 20 MG PO TABS
40.0000 mg | ORAL_TABLET | Freq: Every day | ORAL | 0 refills | Status: AC
  Filled 2023-06-29: qty 8, 4d supply, fill #0

## 2023-06-29 MED ORDER — AMLODIPINE BESYLATE 5 MG PO TABS
5.0000 mg | ORAL_TABLET | Freq: Once | ORAL | Status: AC
  Administered 2023-06-29: 5 mg via ORAL
  Filled 2023-06-29: qty 1

## 2023-06-29 MED ORDER — CARVEDILOL 12.5 MG PO TABS
25.0000 mg | ORAL_TABLET | Freq: Two times a day (BID) | ORAL | Status: DC
  Filled 2023-06-29: qty 2

## 2023-06-29 MED ORDER — HYDRALAZINE HCL 25 MG PO TABS
100.0000 mg | ORAL_TABLET | Freq: Once | ORAL | Status: AC
  Administered 2023-06-29: 100 mg via ORAL
  Filled 2023-06-29: qty 4

## 2023-06-29 NOTE — Discharge Instructions (Signed)
 Follow up with your family doc in the office.  Use your neb every 4 hours while awake, return for worsening difficultly breathing.  Go to dialysis tomorrow.

## 2023-06-29 NOTE — ED Triage Notes (Signed)
 Presents from home for SOB x3 days, worse this AM.  H/o CHF, HD (t-th-sat, none missed), DM  EMS exam: diminished, improved after tx, able to hear rales. 110bpm, 200/120 (improved to 180/100 en route), 92% RA, 100% on 10L with tx. Meds received pta: 0.4mg  nitroglycerin, 10mg  albuterol, 1mg  atrovent, 125mg  solumedrol

## 2023-06-29 NOTE — ED Provider Notes (Signed)
 [place] EMERGENCY DEPARTMENT AT [facility] Provider Note   CSN: [id number] Arrival date & time: 06/29/23  7353     History  Chief Complaint  Patient presents with   Shortness of Breath    Matthew Odom is a 53 y.o. male.  53 yo M with a chief complaints of difficulty breathing.  This has been going on for about 3 days now.  He feels like maybe there is a bit of a pressure to his chest.  He took a nebulizer treatment at home and had some improvement a couple days ago.  Has been feeling okay but was getting progressively worse.  Today he took his daughter to school and when he walked in and walked out felt suddenly worse.  He ended up calling EMS.  Found by EMS report to have diminished breath sounds in all fields.  Was given 2 DuoNebs and Solu-Medrol with significant improvement of aeration.  Patient does feel a bit better now.  He denied cough congestion or fever.  Denies sick contacts.  Denies nausea vomiting or diarrhea.  Patient does get dialysis Tuesday Thursday and Saturday denies any missed sessions.   Shortness of Breath      Home Medications Prior to Admission medications   Medication Sig Start Date End Date Taking? Authorizing Provider  predniSONE (DELTASONE) 20 MG tablet Take 2 tablets (40 mg total) by mouth daily for 4 days. 06/29/23 07/03/23 Yes Melene Plan, DO  acetaminophen (TYLENOL) 500 MG tablet Take 500-1,000 mg by mouth every 6 (six) hours as needed (pain.).    [provider]  albuterol (PROVENTIL) (2.5 MG/3ML) 0.083% nebulizer solution Take 3 mLs (2.5 mg total) by nebulization every 6 (six) hours as needed for wheezing or shortness of breath. 01/14/23   Rema Fendt, NP  albuterol (PROVENTIL) (2.5 MG/3ML) 0.083% nebulizer solution Take 3 mLs (2.5 mg total) by nebulization every 6 (six) hours as needed for wheezing or shortness of breath. Patient not taking: Reported on 06/15/2023 05/25/23   Wynetta Fines, MD  amLODipine (NORVASC) 10 MG  tablet Take 1 tablet (10 mg total) by mouth daily. 12/05/22   O'NealRonnald Ramp, MD  aspirin EC 81 MG tablet Take one tablet by mouth twice daily for 30 days after sugery for blood clot prevention. Start post op day 1. Swallow whole. 06/18/23   [place], Jesse J, PA-C  atorvastatin (LIPITOR) 80 MG tablet Take 1 tablet (80 mg total) by mouth daily. Patient not taking: Reported on 06/15/2023 11/13/21 01/14/23  Kathlen Mody, MD  budesonide-formoterol [facility]) 160-4.5 MCG/ACT inhaler Inhale 2 puffs into the lungs 2 (two) times daily. 01/14/23   Rema Fendt, NP  calcium acetate (PHOSLO) 667 MG capsule Take 2 capsules (1,334 mg total) by mouth 3 (three) times daily with meals 06/25/21     Calcium Carbonate Antacid (TUMS PO) Take 2 tablets by mouth 3 (three) times daily with meals.    [provider]  carvedilol (COREG) 25 MG tablet Take 1 tablet (25 mg total) by mouth 2 (two) times daily with a meal. 12/05/22   O'Neal, Ronnald Ramp, MD  doxazosin (CARDURA) 4 MG tablet Take 12 mg by mouth at bedtime.    [provider]  doxazosin (CARDURA) 8 MG tablet Take 1 tablet (8 mg total) by mouth daily. Patient not taking: Reported on 06/15/2023 12/05/22   Sande Rives, MD  hydrALAZINE (APRESOLINE) 100 MG tablet Take 1 tablet (100 mg total) by mouth 3 (three) times  daily. 12/05/22   O'Neal, Ronnald Ramp, MD  Insulin Glargine [facility]) 100 UNIT/ML Inject 20 Units into the skin daily. Patient taking differently: Inject 10 Units into the skin 2 (two) times daily. 06/19/21   Shamleffer, Konrad Dolores, MD  insulin lispro (HUMALOG) 100 UNIT/ML injection Inject 0-4 Units into the skin 3 (three) times daily as needed for high blood sugar.    [provider]  Insulin Pen Needle 31G X 6 MM MISC Use as directed in the morning, at noon, in the evening, and at bedtime. 06/19/21   Shamleffer, Konrad Dolores, MD  liraglutide (VICTOZA) 18 MG/3ML SOPN Inject 1.8 mg into the skin daily.  06/19/21   Shamleffer, Konrad Dolores, MD  Misc. Devices MISC Please provide patient with insurance approved CPAP with the following settings:  autopap 15-20.  Large size [employer] Full Face Mask Forma mask and heated humidification. 12/25/19   Claiborne Rigg, NP  nicotine (NICODERM CQ - DOSED IN MG/24 HOURS) 14 mg/24hr patch Place 1 patch (14 mg total) onto the skin daily as needed. Patient not taking: Reported on 06/15/2023 01/14/23   Rema Fendt, NP  nitroGLYCERIN (NITROSTAT) 0.4 MG SL tablet Place 1 tablet (0.4 mg total) under the tongue every 5 (five) minutes as needed for up to 30 doses for chest pain. 07/16/21   Terald Sleeper, MD  oxyCODONE (ROXICODONE) 5 MG immediate release tablet Take 1 tablet (5 mg total) by mouth every 4 (four) hours as needed (for post op pain). 06/18/23   [place], Jesse J, PA-C  sertraline (ZOLOFT) 25 MG tablet Take 1 tablet (25 mg total) by mouth daily. 01/14/23   Rema Fendt, NP      Allergies    Aspirin and Nitroglycerin    Review of Systems   Review of Systems  Respiratory:  Positive for shortness of breath.     Physical Exam Updated Vital Signs BP (!) 178/104   Pulse 87   Temp 97.8 F (36.6 C) (Oral)   Resp 14   Ht 5\' 11"  (1.803 m)   Wt 119 kg   SpO2 94%   BMI 36.59 kg/m  Physical Exam Vitals and nursing note reviewed.  Constitutional:      Appearance: He is well-developed.  HENT:     Head: Normocephalic and atraumatic.  Eyes:     Pupils: Pupils are equal, round, and reactive to light.  Neck:     Vascular: No JVD.  Cardiovascular:     Rate and Rhythm: Normal rate and regular rhythm.     Heart sounds: No murmur heard.    No friction rub. No gallop.  Pulmonary:     Effort: No respiratory distress.     Breath sounds: Wheezing present.     Comments: Diminished breath sounds in all fields with prolonged expiratory effort end expiratory wheezes Abdominal:     General: There is no distension.     Tenderness: There is no  abdominal tenderness. There is no guarding or rebound.  Musculoskeletal:        General: Normal range of motion.     Cervical back: Normal range of motion and neck supple.  Skin:    Coloration: Skin is not pale.     Findings: No rash.  Neurological:     Mental Status: He is alert and oriented to person, place, and time.  Psychiatric:        Behavior: Behavior normal.     ED Results / Procedures / Treatments  Labs (all labs ordered are listed, but only abnormal results are displayed) Labs Reviewed  CBC WITH DIFFERENTIAL/PLATELET - Abnormal; Notable for the following components:      Result Value   WBC 11.4 (*)    RBC 3.51 (*)    Hemoglobin 9.8 (*)    HCT 32.2 (*)    All other components within normal limits  BASIC METABOLIC PANEL - Abnormal; Notable for the following components:   Chloride 94 (*)    Glucose, Bld 155 (*)    BUN 75 (*)    Creatinine, Ser 11.36 (*)    Calcium 8.6 (*)    GFR, Estimated 5 (*)    Anion gap 20 (*)    All other components within normal limits  BRAIN NATRIURETIC PEPTIDE - Abnormal; Notable for the following components:   B Natriuretic Peptide 974.3 (*)    All other components within normal limits  TROPONIN I (HIGH SENSITIVITY) - Abnormal; Notable for the following components:   Troponin I (High Sensitivity) 107 (*)    All other components within normal limits  TROPONIN I (HIGH SENSITIVITY) - Abnormal; Notable for the following components:   Troponin I (High Sensitivity) 113 (*)    All other components within normal limits  RESP PANEL BY RT-PCR (RSV, FLU A&B, COVID)  [id number]    EKG EKG Interpretation Date/Time:  Monday June 29 2023 08:12:05 EST Ventricular Rate:  94 PR Interval:  188 QRS Duration:  133 QT Interval:  404 QTC Calculation: 506 R Axis:   95  Text Interpretation: Sinus rhythm Paired ventricular premature complexes Nonspecific intraventricular conduction delay frequent pvc flipped t waves laterally seen on prior not evident  today Otherwise no significant change Confirmed by Melene Plan [phone]) on 06/29/2023 8:26:37 AM  Radiology DG Chest Port 1 View Result Date: 06/29/2023 CLINICAL DATA:  Shortness of breath EXAM: PORTABLE CHEST 1 VIEW COMPARISON:  Chest radiograph dated 05/25/2023 FINDINGS: Normal lung volumes. Bilateral interstitial opacities. No pleural effusion or pneumothorax. Increased enlargement of the cardiomediastinal silhouette. No acute osseous abnormality. IMPRESSION: 1. Bilateral interstitial opacities, likely pulmonary edema. 2. Increased cardiomegaly. Electronically Signed   By: Agustin Cree M.D.   On: 06/29/2023 08:37    Procedures .Critical Care  Performed by: Melene Plan, DO Authorized by: Melene Plan, DO   Critical care provider statement:    Critical care time (minutes):  35   Critical care time was exclusive of:  Separately billable procedures and treating other patients   Critical care was time spent personally by me on the following activities:  Development of treatment plan with patient or surrogate, discussions with consultants, evaluation of patient's response to treatment, examination of patient, ordering and review of laboratory studies, ordering and review of radiographic studies, ordering and performing treatments and interventions, pulse oximetry, re-evaluation of patient's condition and review of old charts   Care discussed with: admitting provider       Medications Ordered in ED Medications  carvedilol (COREG) tablet 25 mg (has no administration in time range)  albuterol (PROVENTIL) (2.5 MG/3ML) 0.083% nebulizer solution (10 mg/hr Nebulization Given 06/29/23 0827)  hydrALAZINE (APRESOLINE) tablet 100 mg (100 mg Oral Given 06/29/23 1121)  amLODipine (NORVASC) tablet 5 mg (5 mg Oral Given 06/29/23 1121)    ED Course/ Medical Decision Making/ A&P                                 Medical Decision Making  Amount and/or Complexity of Data Reviewed Labs: ordered. Radiology:  ordered. ECG/medicine tests: ordered.  Risk Prescription drug management.   53 yo M with a cc of difficulty breathing.  This has been going on for about 3 days.  He was picked up by EMS and had significant improvement with their treatment with DuoNebs and steroids.  Patient still having some difficulty breathing.  Will give a continuous treatments.  Chest x-ray blood work.  Patient also recently status post left ankle repair.  I think the patient improved significantly with breathing treatments it would make it much less likely the patient has an acute pulmonary embolism.  Troponin stable on repeat checks.  BNP elevated.  COVID flu and RSV negative.  Mild leukocytosis.  No acute anemia.  Patient feeling much better after DuoNeb's of continuous albuterol treatment and steroids.  Would like to go home.  Started on burst of steroids.  PCP follow-up.  3:16 PM:  I have discussed the diagnosis/risks/treatment options with the patient and family.  Evaluation and diagnostic testing in the emergency department does not suggest an emergent condition requiring admission or immediate intervention beyond what has been performed at this time.  They will follow up with PCP. We also discussed returning to the ED immediately if new or worsening sx occur. We discussed the sx which are most concerning (e.g., sudden worsening pain, fever, inability to tolerate by mouth) that necessitate immediate return. Medications administered to the patient during their visit and any new prescriptions provided to the patient are listed below.  Medications given during this visit Medications  carvedilol (COREG) tablet 25 mg (has no administration in time range)  albuterol (PROVENTIL) (2.5 MG/3ML) 0.083% nebulizer solution (10 mg/hr Nebulization Given 06/29/23 0827)  hydrALAZINE (APRESOLINE) tablet 100 mg (100 mg Oral Given 06/29/23 1121)  amLODipine (NORVASC) tablet 5 mg (5 mg Oral Given 06/29/23 1121)     The patient appears  reasonably screen and/or stabilized for discharge and I doubt any other medical condition or other [facility] requiring further screening, evaluation, or treatment in the ED at this time prior to discharge.          Final Clinical Impression(s) / ED Diagnoses Final diagnoses:  COPD exacerbation (HCC)    Rx / DC Orders ED Discharge Orders          Ordered    predniSONE (DELTASONE) 20 MG tablet  Daily        06/29/23 1204              Melene Plan, [place] 06/29/23 1516

## 2023-06-30 ENCOUNTER — Telehealth: Payer: Self-pay

## 2023-06-30 DIAGNOSIS — N186 End stage renal disease: Secondary | ICD-10-CM | POA: Diagnosis not present

## 2023-06-30 DIAGNOSIS — Z992 Dependence on renal dialysis: Secondary | ICD-10-CM | POA: Diagnosis not present

## 2023-06-30 DIAGNOSIS — N2581 Secondary hyperparathyroidism of renal origin: Secondary | ICD-10-CM | POA: Diagnosis not present

## 2023-06-30 NOTE — Transitions of Care (Post Inpatient/ED Visit) (Signed)
 06/30/2023  Name: Matthew Odom MRN: [id number] DOB: 30-Jan-1971  Today's TOC FU Call Status: Today's TOC FU Call Status:: Successful TOC FU Call Completed TOC FU Call Complete Date: 06/30/23 Patient's Name and Date of Birth confirmed.  Transition Care Management Follow-up Telephone Call Date of Discharge: 06/29/23 Discharge Facility: [facility]) Type of Discharge: Emergency Department Reason for ED Visit: Respiratory Respiratory Diagnosis: COPD Exacerbation How have you been since you were released from the hospital?: Better Any questions or concerns?: No  Items Reviewed: Did you receive and understand the discharge instructions provided?: Yes Medications obtained,verified, and reconciled?: Yes (Medications Reviewed) Any new allergies since your discharge?: No Dietary orders reviewed?: Yes Do you have support at home?: Yes People in Home: spouse  Medications Reviewed Today: Medications Reviewed Today     Reviewed by Karena Addison, LPN (Licensed Practical Nurse) on 06/30/23 at 1724  Med List Status: <None>   Medication Order Taking? Sig Documenting Provider Last Dose Status Informant  acetaminophen (TYLENOL) 500 MG tablet [id number] No Take 500-1,000 mg by mouth every 6 (six) hours as needed (pain.). [provider] Taking Active Spouse/Significant Other, Pharmacy Records  albuterol (PROVENTIL) (2.5 MG/3ML) 0.083% nebulizer solution [id number] No Take 3 mLs (2.5 mg total) by nebulization every 6 (six) hours as needed for wheezing or shortness of breath. Rema Fendt, NP Taking Active Spouse/Significant Other, Pharmacy Records  albuterol (PROVENTIL) (2.5 MG/3ML) 0.083% nebulizer solution [id number] No Take 3 mLs (2.5 mg total) by nebulization every 6 (six) hours as needed for wheezing or shortness of breath.  Patient not taking: Reported on 06/15/2023   Wynetta Fines, MD Not Taking Active Spouse/Significant Other, Pharmacy Records  amLODipine (NORVASC) 10 MG  tablet [id number] No Take 1 tablet (10 mg total) by mouth daily. Sande Rives, MD 06/18/2023 Morning Active Spouse/Significant Other, Pharmacy Records  aspirin EC 81 MG tablet [id number]  Take one tablet by mouth twice daily for 30 days after sugery for blood clot prevention. Start post op day 1. Swallow whole. [place], Jesse J, PA-C  Active   atorvastatin (LIPITOR) 80 MG tablet [id number] No Take 1 tablet (80 mg total) by mouth daily.  Patient not taking: Reported on 06/15/2023   Kathlen Mody, MD Not Taking Expired 01/14/23 2359   budesonide-formoterol (SYMBICORT) 160-4.5 MCG/ACT inhaler [id number] No Inhale 2 puffs into the lungs 2 (two) times daily. Rema Fendt, NP 06/17/2023 Evening Active Spouse/Significant Other, Pharmacy Records  calcium acetate (PHOSLO) 667 MG capsule [id number] No Take 2 capsules (1,334 mg total) by mouth 3 (three) times daily with meals  Past Week Active Spouse/Significant Other, Pharmacy Records           Med Note Vickey Sages, HEATHER L   Tue Nov 12, 2021 10:18 AM)    Calcium Carbonate Antacid (TUMS PO) [id number] No Take 2 tablets by mouth 3 (three) times daily with meals. [provider] Past Week Active Spouse/Significant Other, Pharmacy Records  carvedilol (COREG) 25 MG tablet [id number] No Take 1 tablet (25 mg total) by mouth 2 (two) times daily with a meal. O'Neal, Ronnald Ramp, MD 06/18/2023 Morning Active Spouse/Significant Other, Pharmacy Records  doxazosin (CARDURA) 4 MG tablet [id number] No Take 12 mg by mouth at bedtime. [provider] 06/18/2023 Morning Active Spouse/Significant Other, Pharmacy Records  doxazosin (CARDURA) 8 MG tablet [id number] No Take 1 tablet (8 mg total) by mouth daily.  Patient not taking: Reported on 06/15/2023   Sande Rives, MD Not Taking Active Spouse/Significant Other, Pharmacy Records  hydrALAZINE (APRESOLINE) 100 MG tablet [id number] No Take 1 tablet (100 mg total) by mouth 3 (three) times daily. Sande Rives, MD 06/18/2023 Morning Active Spouse/Significant Other, Pharmacy Records  Insulin Glargine [facility] KWIKPEN) 100 UNIT/ML [id number] No Inject 20 Units into the skin daily.  Patient taking differently: Inject 10 Units into the skin 2 (two) times daily.   Shamleffer, Konrad Dolores, MD More than a month Active Spouse/Significant Other, Pharmacy Records           Med Note Vickey Sages, HEATHER L   Tue Nov 12, 2021 10:18 AM)    insulin lispro (HUMALOG) 100 UNIT/ML injection [id number] No Inject 0-4 Units into the skin 3 (three) times daily as needed for high blood sugar. [provider] More than a month Active Spouse/Significant Other, Pharmacy Records  Insulin Pen Needle 31G X 6 MM MISC [id number] No Use as directed in the morning, at noon, in the evening, and at bedtime. Shamleffer, Konrad Dolores, MD Past Week Active Spouse/Significant Other, Pharmacy Records  liraglutide (VICTOZA) 18 MG/3ML SOPN [id number] No Inject 1.8 mg into the skin daily. Shamleffer, Konrad Dolores, MD Past Week Active Spouse/Significant Other, Pharmacy Records  Misc. Devices MISC [id number] No Please provide patient with insurance approved CPAP with the following settings:  autopap 15-20.  Large size [employer] Full Face Mask Forma mask and heated humidification. Claiborne Rigg, NP Taking Active Spouse/Significant Other, Pharmacy Records  nicotine (NICODERM CQ - DOSED IN MG/24 HOURS) 14 mg/24hr patch [id number] No Place 1 patch (14 mg total) onto the skin daily as needed.  Patient not taking: Reported on 06/15/2023   Rema Fendt, NP Not Taking Active Spouse/Significant Other, Pharmacy Records  nitroGLYCERIN (NITROSTAT) 0.4 MG SL tablet [id number] No Place 1 tablet (0.4 mg total) under the tongue every 5 (five) minutes as needed for up to 30 doses for chest pain. Terald Sleeper, MD Taking Active Spouse/Significant Other, Pharmacy Records           Med Note Sedonia Small Jun 15, 2023  1:17 PM)     oxyCODONE (ROXICODONE) 5 MG immediate release tablet [id number]  Take 1 tablet (5 mg total) by mouth every 4 (four) hours as needed (for post op pain). [place], Jesse J, PA-C  Active   predniSONE (DELTASONE) 20 MG tablet [id number]  Take 2 tablets (40 mg total) by mouth daily for 4 days. Melene Plan, DO  Active   sertraline (ZOLOFT) 25 MG tablet [id number] No Take 1 tablet (25 mg total) by mouth daily. Rema Fendt, NP 06/18/2023 Morning Active Spouse/Significant Other, Pharmacy Records  Med List Note Reginia Forts, Francina Ames 05/08/21 1246):              Home Care and Equipment/Supplies: Were Home Health Services Ordered?: NA Any new equipment or medical supplies ordered?: NA  Functional Questionnaire: Do you need assistance with bathing/showering or dressing?: No Do you need assistance with meal preparation?: No Do you need assistance with eating?: No Do you have difficulty maintaining continence: No Do you need assistance with getting out of bed/getting out of a chair/moving?: No Do you have difficulty managing or taking your medications?: No  Follow up appointments reviewed: PCP Follow-up appointment confirmed?: No (no avail appt, sent message to staff to schedule) MD Provider Line Number:[phone] Given: No Specialist Hospital Follow-up appointment confirmed?: NA Do you need transportation to your follow-up appointment?: No Do you understand care options if your condition(s) worsen?: Yes-patient verbalized understanding  SIGNATURE  Karena Addison, LPN [facility] Nurse Health Advisor Direct Dial [phone]

## 2023-06-30 NOTE — Telephone Encounter (Signed)
 Patient needs ER follow up within 7 days. No avail appts. Please schedule and inform patient. thanks

## 2023-07-01 DIAGNOSIS — S82842A Displaced bimalleolar fracture of left lower leg, initial encounter for closed fracture: Secondary | ICD-10-CM | POA: Diagnosis not present

## 2023-07-01 NOTE — Telephone Encounter (Signed)
Keep all scheduled appointments 

## 2023-07-02 DIAGNOSIS — N186 End stage renal disease: Secondary | ICD-10-CM | POA: Diagnosis not present

## 2023-07-02 DIAGNOSIS — Z992 Dependence on renal dialysis: Secondary | ICD-10-CM | POA: Diagnosis not present

## 2023-07-02 DIAGNOSIS — N2581 Secondary hyperparathyroidism of renal origin: Secondary | ICD-10-CM | POA: Diagnosis not present

## 2023-07-03 DIAGNOSIS — E1129 Type 2 diabetes mellitus with other diabetic kidney complication: Secondary | ICD-10-CM | POA: Diagnosis not present

## 2023-07-03 DIAGNOSIS — N186 End stage renal disease: Secondary | ICD-10-CM | POA: Diagnosis not present

## 2023-07-03 DIAGNOSIS — Z992 Dependence on renal dialysis: Secondary | ICD-10-CM | POA: Diagnosis not present

## 2023-07-04 DIAGNOSIS — N186 End stage renal disease: Secondary | ICD-10-CM | POA: Diagnosis not present

## 2023-07-04 DIAGNOSIS — Z992 Dependence on renal dialysis: Secondary | ICD-10-CM | POA: Diagnosis not present

## 2023-07-04 DIAGNOSIS — N2581 Secondary hyperparathyroidism of renal origin: Secondary | ICD-10-CM | POA: Diagnosis not present

## 2023-07-07 DIAGNOSIS — N186 End stage renal disease: Secondary | ICD-10-CM | POA: Diagnosis not present

## 2023-07-07 DIAGNOSIS — N2581 Secondary hyperparathyroidism of renal origin: Secondary | ICD-10-CM | POA: Diagnosis not present

## 2023-07-07 DIAGNOSIS — Z992 Dependence on renal dialysis: Secondary | ICD-10-CM | POA: Diagnosis not present

## 2023-07-09 ENCOUNTER — Other Ambulatory Visit: Payer: Self-pay

## 2023-07-09 DIAGNOSIS — N186 End stage renal disease: Secondary | ICD-10-CM | POA: Diagnosis not present

## 2023-07-09 DIAGNOSIS — N2581 Secondary hyperparathyroidism of renal origin: Secondary | ICD-10-CM | POA: Diagnosis not present

## 2023-07-09 DIAGNOSIS — Z992 Dependence on renal dialysis: Secondary | ICD-10-CM | POA: Diagnosis not present

## 2023-07-13 DIAGNOSIS — S82842A Displaced bimalleolar fracture of left lower leg, initial encounter for closed fracture: Secondary | ICD-10-CM | POA: Diagnosis not present

## 2023-07-14 DIAGNOSIS — N186 End stage renal disease: Secondary | ICD-10-CM | POA: Diagnosis not present

## 2023-07-14 DIAGNOSIS — N2581 Secondary hyperparathyroidism of renal origin: Secondary | ICD-10-CM | POA: Diagnosis not present

## 2023-07-14 DIAGNOSIS — Z992 Dependence on renal dialysis: Secondary | ICD-10-CM | POA: Diagnosis not present

## 2023-07-16 ENCOUNTER — Ambulatory Visit: Admitting: Podiatry

## 2023-07-16 DIAGNOSIS — N186 End stage renal disease: Secondary | ICD-10-CM | POA: Diagnosis not present

## 2023-07-16 DIAGNOSIS — N2581 Secondary hyperparathyroidism of renal origin: Secondary | ICD-10-CM | POA: Diagnosis not present

## 2023-07-16 DIAGNOSIS — Z992 Dependence on renal dialysis: Secondary | ICD-10-CM | POA: Diagnosis not present

## 2023-07-18 DIAGNOSIS — N186 End stage renal disease: Secondary | ICD-10-CM | POA: Diagnosis not present

## 2023-07-18 DIAGNOSIS — N2581 Secondary hyperparathyroidism of renal origin: Secondary | ICD-10-CM | POA: Diagnosis not present

## 2023-07-18 DIAGNOSIS — Z992 Dependence on renal dialysis: Secondary | ICD-10-CM | POA: Diagnosis not present

## 2023-07-21 DIAGNOSIS — Z992 Dependence on renal dialysis: Secondary | ICD-10-CM | POA: Diagnosis not present

## 2023-07-21 DIAGNOSIS — N186 End stage renal disease: Secondary | ICD-10-CM | POA: Diagnosis not present

## 2023-07-21 DIAGNOSIS — N2581 Secondary hyperparathyroidism of renal origin: Secondary | ICD-10-CM | POA: Diagnosis not present

## 2023-07-23 DIAGNOSIS — Z992 Dependence on renal dialysis: Secondary | ICD-10-CM | POA: Diagnosis not present

## 2023-07-23 DIAGNOSIS — N2581 Secondary hyperparathyroidism of renal origin: Secondary | ICD-10-CM | POA: Diagnosis not present

## 2023-07-23 DIAGNOSIS — N186 End stage renal disease: Secondary | ICD-10-CM | POA: Diagnosis not present

## 2023-07-28 DIAGNOSIS — N186 End stage renal disease: Secondary | ICD-10-CM | POA: Diagnosis not present

## 2023-07-28 DIAGNOSIS — Z992 Dependence on renal dialysis: Secondary | ICD-10-CM | POA: Diagnosis not present

## 2023-07-28 DIAGNOSIS — N2581 Secondary hyperparathyroidism of renal origin: Secondary | ICD-10-CM | POA: Diagnosis not present

## 2023-07-29 NOTE — Telephone Encounter (Signed)
error 

## 2023-07-30 DIAGNOSIS — N2581 Secondary hyperparathyroidism of renal origin: Secondary | ICD-10-CM | POA: Diagnosis not present

## 2023-07-30 DIAGNOSIS — N186 End stage renal disease: Secondary | ICD-10-CM | POA: Diagnosis not present

## 2023-07-30 DIAGNOSIS — Z992 Dependence on renal dialysis: Secondary | ICD-10-CM | POA: Diagnosis not present

## 2023-07-31 ENCOUNTER — Encounter: Payer: Self-pay | Admitting: Podiatry

## 2023-07-31 ENCOUNTER — Ambulatory Visit: Payer: Self-pay

## 2023-07-31 ENCOUNTER — Ambulatory Visit (INDEPENDENT_AMBULATORY_CARE_PROVIDER_SITE_OTHER): Admitting: Podiatry

## 2023-07-31 DIAGNOSIS — M79674 Pain in right toe(s): Secondary | ICD-10-CM

## 2023-07-31 DIAGNOSIS — B351 Tinea unguium: Secondary | ICD-10-CM | POA: Diagnosis not present

## 2023-07-31 DIAGNOSIS — S82842A Displaced bimalleolar fracture of left lower leg, initial encounter for closed fracture: Secondary | ICD-10-CM | POA: Diagnosis not present

## 2023-07-31 DIAGNOSIS — Z794 Long term (current) use of insulin: Secondary | ICD-10-CM

## 2023-07-31 DIAGNOSIS — E119 Type 2 diabetes mellitus without complications: Secondary | ICD-10-CM | POA: Diagnosis not present

## 2023-07-31 NOTE — Progress Notes (Signed)
 This patient returns to my office for at risk foot care.  This patient requires this care by a professional since this patient will be at risk due to having  diabetes.This patient is unable to cut his first and second toenails right foot  himself since the patient cannot reach his nails.These nails are painful walking and wearing shoes.  This patient presents for at risk foot care today.  General Appearance  Alert, conversant and in no acute stress.  Vascular  Dorsalis pedis and posterior tibial  pulses are palpable  bilaterally.  Capillary return is within normal limits  bilaterally. Temperature is within normal limits  bilaterally.  Neurologic  Senn-Weinstein monofilament wire test within normal limits  bilaterally. Muscle power within normal limits bilaterally.  Nails Thick disfigured discolored nails with subungual debris  first and second toenails right foot. No evidence of bacterial infection or drainage bilaterally.  Orthopedic  No limitations of motion  feet .  No crepitus or effusions noted.  No bony pathology or digital deformities noted. Patient wearing cast onleft foot.  Skin  normotropic skin with no porokeratosis noted bilaterally.  No signs of infections or ulcers noted.     Onychomycosis  Pain in right toes  Pain in left toes  Consent was obtained for treatment procedures.   Mechanical debridement of nails 1-5  bilaterally performed with a nail nipper.  Filed with dremel without incident.    Return office visit     3 months                 Told patient to return for periodic foot care and evaluation due to potential at risk complications.   Helane Gunther DPM

## 2023-07-31 NOTE — Telephone Encounter (Signed)
 Report to Emergency Department/Urgent Care/call 911 for immediate medical evaluation. Follow-up with Primary Care.

## 2023-07-31 NOTE — Telephone Encounter (Signed)
 Copied from CRM [id number]. Topic: General - Other >> Jul 31, 2023  9:01 AM Abundio Miu S wrote: Reason for CRM: Patient requesting a callback from provider. Patient states he is unable to sleep, requesting medication to be called in to his pharmacy.  Cb# [id number].  Chief Complaint: insomnia Symptoms: insomnia,goes to sleep at night but wakes right back up Frequency: x several months and worsening Pertinent Negatives: Patient denies caffeine use, SOB, stress Disposition: [] ED /[] Urgent Care (no appt availability in office) / [] Appointment(In office/virtual)/ []  [place] [facility]/ [] Home Care/ [] Refused Recommended Disposition /[] [place] [facility]/ []  Follow-up with PCP Additional Notes: pt stated slept a total of 12 hours for this week. Pt is tired, ill and exhausted due to lack of sleep.  Every night pt usually take tylenol or advil  PM to help go to sleep - pt states it seems like these OTC medications are no longer working and would like to get Rx sleep aide to help.  Reason for Disposition  [1] Insomnia persists > 1 week AND [2] no improvement after using Care Advice  Answer Assessment - Initial Assessment Questions 1. DESCRIPTION: "Tell me about your sleeping problem."     Unable to sleep at night. 2. ONSET: "How long have you been having trouble sleeping?" (e.g., days, weeks, months)     X several months 3. RECURRENT: "Have you had sleeping problems before?"  If Yes, ask: "What happened that time?" "What helped your sleeping problem go away in the past?"      never 4. STRESS: "Is there anything in your life that is making you feel stressed or tense?"     no 5. PAIN: "Do you have any pain that is keeping you awake?" (e.g., back pain, headache, abdomen pain)     no 6. CAFFEINE ABUSE: "Do you drink caffeinated beverages, and how much each day?" (e.g., coffee, tea, colas)     No because on dialysis 7. ALCOHOL USE OR SUBSTANCE USE (DRUG USE): "Do you drink alcohol or use any  illegal drugs?"     no 8. OTHER SYMPTOMS: "Do you have any other symptoms?"  (e.g., difficulty breathing)     no  Protocols used: Insomnia-A-AH

## 2023-08-03 ENCOUNTER — Ambulatory Visit: Payer: Self-pay | Admitting: Family Medicine

## 2023-08-03 DIAGNOSIS — E1129 Type 2 diabetes mellitus with other diabetic kidney complication: Secondary | ICD-10-CM | POA: Diagnosis not present

## 2023-08-03 DIAGNOSIS — N186 End stage renal disease: Secondary | ICD-10-CM | POA: Diagnosis not present

## 2023-08-03 DIAGNOSIS — Z992 Dependence on renal dialysis: Secondary | ICD-10-CM | POA: Diagnosis not present

## 2023-08-06 DIAGNOSIS — Z992 Dependence on renal dialysis: Secondary | ICD-10-CM | POA: Diagnosis not present

## 2023-08-06 DIAGNOSIS — N186 End stage renal disease: Secondary | ICD-10-CM | POA: Diagnosis not present

## 2023-08-06 DIAGNOSIS — N2581 Secondary hyperparathyroidism of renal origin: Secondary | ICD-10-CM | POA: Diagnosis not present

## 2023-08-11 DIAGNOSIS — N186 End stage renal disease: Secondary | ICD-10-CM | POA: Diagnosis not present

## 2023-08-11 DIAGNOSIS — Z992 Dependence on renal dialysis: Secondary | ICD-10-CM | POA: Diagnosis not present

## 2023-08-11 DIAGNOSIS — N2581 Secondary hyperparathyroidism of renal origin: Secondary | ICD-10-CM | POA: Diagnosis not present

## 2023-08-11 NOTE — Telephone Encounter (Signed)
 I called and patient aware that Np Zonia Kief recommended Report to Emergency Department/Urgent Care/call 911 for immediate medical evaluation. Follow-up with Primary Care. Patient state he would make a appointment to come in.

## 2023-08-12 ENCOUNTER — Ambulatory Visit: Admitting: Family

## 2023-08-13 DIAGNOSIS — N2581 Secondary hyperparathyroidism of renal origin: Secondary | ICD-10-CM | POA: Diagnosis not present

## 2023-08-13 DIAGNOSIS — Z992 Dependence on renal dialysis: Secondary | ICD-10-CM | POA: Diagnosis not present

## 2023-08-13 DIAGNOSIS — N186 End stage renal disease: Secondary | ICD-10-CM | POA: Diagnosis not present

## 2023-08-17 ENCOUNTER — Encounter: Admitting: Family

## 2023-08-17 NOTE — Progress Notes (Signed)
 Erroneous encounter-disregard

## 2023-08-20 DIAGNOSIS — Z992 Dependence on renal dialysis: Secondary | ICD-10-CM | POA: Diagnosis not present

## 2023-08-20 DIAGNOSIS — N186 End stage renal disease: Secondary | ICD-10-CM | POA: Diagnosis not present

## 2023-08-20 DIAGNOSIS — N2581 Secondary hyperparathyroidism of renal origin: Secondary | ICD-10-CM | POA: Diagnosis not present

## 2023-08-25 DIAGNOSIS — Z992 Dependence on renal dialysis: Secondary | ICD-10-CM | POA: Diagnosis not present

## 2023-08-25 DIAGNOSIS — N2581 Secondary hyperparathyroidism of renal origin: Secondary | ICD-10-CM | POA: Diagnosis not present

## 2023-08-25 DIAGNOSIS — N186 End stage renal disease: Secondary | ICD-10-CM | POA: Diagnosis not present

## 2023-08-29 DIAGNOSIS — N2581 Secondary hyperparathyroidism of renal origin: Secondary | ICD-10-CM | POA: Diagnosis not present

## 2023-08-29 DIAGNOSIS — Z992 Dependence on renal dialysis: Secondary | ICD-10-CM | POA: Diagnosis not present

## 2023-08-29 DIAGNOSIS — N186 End stage renal disease: Secondary | ICD-10-CM | POA: Diagnosis not present

## 2023-09-04 DIAGNOSIS — J449 Chronic obstructive pulmonary disease, unspecified: Secondary | ICD-10-CM | POA: Diagnosis not present

## 2023-09-04 DIAGNOSIS — E1122 Type 2 diabetes mellitus with diabetic chronic kidney disease: Secondary | ICD-10-CM | POA: Diagnosis not present

## 2023-09-04 DIAGNOSIS — R0602 Shortness of breath: Secondary | ICD-10-CM | POA: Diagnosis not present

## 2023-09-04 DIAGNOSIS — Z992 Dependence on renal dialysis: Secondary | ICD-10-CM | POA: Diagnosis not present

## 2023-09-04 DIAGNOSIS — F17210 Nicotine dependence, cigarettes, uncomplicated: Secondary | ICD-10-CM | POA: Diagnosis not present

## 2023-09-04 DIAGNOSIS — N186 End stage renal disease: Secondary | ICD-10-CM | POA: Diagnosis not present

## 2023-09-04 DIAGNOSIS — Z91199 Patient's noncompliance with other medical treatment and regimen due to unspecified reason: Secondary | ICD-10-CM | POA: Diagnosis not present

## 2023-09-04 DIAGNOSIS — R7989 Other specified abnormal findings of blood chemistry: Secondary | ICD-10-CM | POA: Diagnosis not present

## 2023-09-04 DIAGNOSIS — N185 Chronic kidney disease, stage 5: Secondary | ICD-10-CM | POA: Diagnosis not present

## 2023-09-04 DIAGNOSIS — R918 Other nonspecific abnormal finding of lung field: Secondary | ICD-10-CM | POA: Diagnosis not present

## 2023-09-04 DIAGNOSIS — I120 Hypertensive chronic kidney disease with stage 5 chronic kidney disease or end stage renal disease: Secondary | ICD-10-CM | POA: Diagnosis not present

## 2023-09-04 DIAGNOSIS — Z91158 Patient's noncompliance with renal dialysis for other reason: Secondary | ICD-10-CM | POA: Diagnosis not present

## 2023-09-04 DIAGNOSIS — Z7982 Long term (current) use of aspirin: Secondary | ICD-10-CM | POA: Diagnosis not present

## 2023-09-05 DIAGNOSIS — N186 End stage renal disease: Secondary | ICD-10-CM | POA: Diagnosis not present

## 2023-09-05 DIAGNOSIS — Z992 Dependence on renal dialysis: Secondary | ICD-10-CM | POA: Diagnosis not present

## 2023-09-05 DIAGNOSIS — N2581 Secondary hyperparathyroidism of renal origin: Secondary | ICD-10-CM | POA: Diagnosis not present

## 2023-09-10 DIAGNOSIS — N2581 Secondary hyperparathyroidism of renal origin: Secondary | ICD-10-CM | POA: Diagnosis not present

## 2023-09-10 DIAGNOSIS — N186 End stage renal disease: Secondary | ICD-10-CM | POA: Diagnosis not present

## 2023-09-10 DIAGNOSIS — Z992 Dependence on renal dialysis: Secondary | ICD-10-CM | POA: Diagnosis not present

## 2023-09-11 ENCOUNTER — Ambulatory Visit: Payer: Medicare HMO

## 2023-09-11 DIAGNOSIS — Z0000 Encounter for general adult medical examination without abnormal findings: Secondary | ICD-10-CM

## 2023-09-11 DIAGNOSIS — Z1211 Encounter for screening for malignant neoplasm of colon: Secondary | ICD-10-CM

## 2023-09-11 NOTE — Progress Notes (Addendum)
 Subjective:   Matthew Odom is a 53 y.o. who presents for a Medicare Wellness preventive visit.  As a reminder, Annual Wellness Visits don't include a physical exam, and some assessments may be limited, especially if this visit is performed virtually. We may recommend an in-person visit if needed.  Visit Complete: Virtual I connected with  Matthew Odom on 09/11/23 by a audio enabled telemedicine application and verified that I am speaking with the correct person using two identifiers.  Patient Location: Home  Provider Location: Office/Clinic  I discussed the limitations of evaluation and management by telemedicine. The patient expressed understanding and agreed to proceed.  Vital Signs: Because this visit was a virtual/telehealth visit, some criteria may be missing or patient reported. Any vitals not documented were not able to be obtained and vitals that have been documented are patient reported.  VideoError- Librarian, academic were attempted between this provider and patient, however failed, due to patient having technical difficulties OR patient did not have access to video capability.  We continued and completed visit with audio only.   Persons Participating in Visit: Patient.  AWV Questionnaire: No: Patient Medicare AWV questionnaire was not completed prior to this visit.  Cardiac Risk Factors include: advanced age (>87men, >55 women)     Objective:     Today's Vitals   09/11/23 0936  PainSc: 8    There is no height or weight on file to calculate BMI.     09/11/2023    9:48 AM 06/15/2023    1:22 PM 05/25/2023    2:22 PM 08/20/2022   10:34 PM 07/11/2022    5:37 PM 11/11/2021    9:10 PM 10/11/2021    8:02 PM  Advanced Directives  Does Patient Have a Medical Advance Directive? No No No No No No No  Would patient like information on creating a medical advance directive? No - Patient declined  No - Patient declined  No - Patient declined No - Patient  declined No - Patient declined    Current Medications (verified) Outpatient Encounter Medications as of 09/11/2023  Medication Sig   acetaminophen  (TYLENOL ) 500 MG tablet Take 500-1,000 mg by mouth every 6 (six) hours as needed (pain.).   albuterol  (PROVENTIL ) (2.5 MG/3ML) 0.083% nebulizer solution Take 3 mLs (2.5 mg total) by nebulization every 6 (six) hours as needed for wheezing or shortness of breath.   albuterol  (PROVENTIL ) (2.5 MG/3ML) 0.083% nebulizer solution Take 3 mLs (2.5 mg total) by nebulization every 6 (six) hours as needed for wheezing or shortness of breath.   amLODipine  (NORVASC ) 10 MG tablet Take 1 tablet (10 mg total) by mouth daily.   aspirin  EC 81 MG tablet Take one tablet by mouth twice daily for 30 days after sugery for blood clot prevention. Start post op day 1. Swallow whole.   budesonide -formoterol  (SYMBICORT ) 160-4.5 MCG/ACT inhaler Inhale 2 puffs into the lungs 2 (two) times daily.   calcium  acetate (PHOSLO ) 667 MG capsule Take 2 capsules (1,334 mg total) by mouth 3 (three) times daily with meals   carvedilol  (COREG ) 25 MG tablet Take 1 tablet (25 mg total) by mouth 2 (two) times daily with a meal.   doxazosin  (CARDURA ) 8 MG tablet Take 1 tablet (8 mg total) by mouth daily.   hydrALAZINE  (APRESOLINE ) 100 MG tablet Take 1 tablet (100 mg total) by mouth 3 (three) times daily.   Insulin  Glargine (BASAGLAR  KWIKPEN) 100 UNIT/ML Inject 20 Units into the skin daily. (Patient taking  differently: Inject 10 Units into the skin 2 (two) times daily.)   insulin  lispro (HUMALOG ) 100 UNIT/ML injection Inject 0-4 Units into the skin 3 (three) times daily as needed for high blood sugar.   Insulin  Pen Needle 31G X 6 MM MISC Use as directed in the morning, at noon, in the evening, and at bedtime.   liraglutide  (VICTOZA ) 18 MG/3ML SOPN Inject 1.8 mg into the skin daily.   Misc. Devices MISC Please provide patient with insurance approved CPAP with the following settings:  autopap 15-20.   Large size [employer] Full Face Mask Forma mask and heated humidification.   atorvastatin  (LIPITOR) 80 MG tablet Take 1 tablet (80 mg total) by mouth daily. (Patient not taking: Reported on 06/15/2023)   Calcium  Carbonate Antacid (TUMS PO) Take 2 tablets by mouth 3 (three) times daily with meals. (Patient not taking: Reported on 09/11/2023)   doxazosin  (CARDURA ) 4 MG tablet Take 12 mg by mouth at bedtime. (Patient not taking: Reported on 09/11/2023)   nicotine  (NICODERM CQ  - DOSED IN MG/24 HOURS) 14 mg/24hr patch Place 1 patch (14 mg total) onto the skin daily as needed. (Patient not taking: Reported on 09/11/2023)   nitroGLYCERIN  (NITROSTAT ) 0.4 MG SL tablet Place 1 tablet (0.4 mg total) under the tongue every 5 (five) minutes as needed for up to 30 doses for chest pain. (Patient not taking: Reported on 09/11/2023)   oxyCODONE  (ROXICODONE ) 5 MG immediate release tablet Take 1 tablet (5 mg total) by mouth every 4 (four) hours as needed (for post op pain). (Patient not taking: Reported on 09/11/2023)   sertraline  (ZOLOFT ) 25 MG tablet Take 1 tablet (25 mg total) by mouth daily. (Patient not taking: Reported on 09/11/2023)   No facility-administered encounter medications on file as of 09/11/2023.    Allergies (verified) Aspirin  and Nitroglycerin    History: Past Medical History:  Diagnosis Date   Anxiety    BPH (benign prostatic hyperplasia)    Cocaine use    07/15/21- "last time was more than 10 years ago."   Complication of anesthesia    wakes up during surgery   COPD (chronic obstructive pulmonary disease) (HCC)    Coronary artery disease 2021   mild non obstructed   Diabetes mellitus without complication (HCC)    Enlarged heart    ESRD (end stage renal disease) (HCC)    TTHSAT   History of degenerative disc disease    Hyperlipidemia    Hypertension    NSTEMI (non-ST elevated myocardial infarction) (HCC)    Pneumonia    Sciatic nerve pain    Sleep apnea    Stroke (HCC)    no residual, x 3  last one was in 2020   Thoracic ascending aortic aneurysm (HCC)    4.2 cm 08/03/21 CTA chest   Past Surgical History:  Procedure Laterality Date   ANKLE ARTHROSCOPY Left 06/18/2023   Procedure: LEFT ANKLE ARTHROSCOPIC DEBRIDEMENT;  Surgeon: Donnamarie Gables, MD;  Location: [facility];  Service: Orthopedics;  Laterality: Left;   AV FISTULA PLACEMENT Left 07/17/2021   Procedure: CREATION  OF LEFT ARM RADIOCEPHALIC ARTERIOVENOUS (AV) FISTULA;  Surgeon: Margherita Shell, MD;  Location: [facility] OR;  Service: Vascular;  Laterality: Left;   CARDIAC CATHETERIZATION     IR FLUORO GUIDE CV LINE RIGHT  05/20/2021   IR US  GUIDE VASC ACCESS RIGHT  05/20/2021   LIGATION OF COMPETING BRANCHES OF ARTERIOVENOUS FISTULA Left 09/13/2021   Procedure: LIGATION AND ELEVATION OF COMPETING BRANCHES OF LEFT ARM  ARTERIOVENOUS FISTULA;  Surgeon: Margherita Shell, MD;  Location: [facility] OR;  Service: Vascular;  Laterality: Left;   ORIF ANKLE FRACTURE Left 06/18/2023   Procedure: OPEN TREATMENT LATERAL MALLEOLUS;  Surgeon: Donnamarie Gables, MD;  Location: [facility];  Service: Orthopedics;  Laterality: Left;   SYNDESMOSIS REPAIR Left 06/18/2023   Procedure: SYNDESMOSIS REPAIR;  Surgeon: Donnamarie Gables, MD;  Location: [facility];  Service: Orthopedics;  Laterality: Left;   Family History  Problem Relation Age of Onset   Heart attack Mother    Hypertension Mother    Diabetes Mother    Heart disease Sister    Hypertension Sister    Hypertension Father    Emphysema Father    Social History   Socioeconomic History   Marital status: Married    Spouse name: Not on file   Number of children: 7   Years of education: Not on file   Highest education level: Not on file  Occupational History   Occupation: disabled  Tobacco Use   Smoking status: Every Day    Current packs/day: 0.50    Average packs/day: 0.5 packs/day for 40.0 years (20.0 ttl pk-yrs)    Types: Cigarettes   Smokeless tobacco: Never   Tobacco comments:     Started smoking again in April.  1/2 ppd.    Vaping Use   Vaping status: Never Used  Substance and Sexual Activity   Alcohol use: Not Currently    Comment: very little   Drug use: Not Currently    Comment: "years ago" per patient   Sexual activity: Yes  Other Topics Concern   Not on file  Social History Narrative   Not on file   Social Drivers of Health   Financial Resource Strain: Low Risk  (09/11/2023)   Overall Financial Resource Strain (CARDIA)    Difficulty of Paying Living Expenses: Not hard at all  Food Insecurity: No Food Insecurity (09/11/2023)   Hunger Vital Sign    Worried About Running Out of Food in the Last Year: Never true    Ran Out of Food in the Last Year: Never true  Transportation Needs: No Transportation Needs (09/11/2023)   PRAPARE - Administrator, Civil Service (Medical): No    Lack of Transportation (Non-Medical): No  Physical Activity: Inactive (09/11/2023)   Exercise Vital Sign    Days of Exercise per Week: 0 days    Minutes of Exercise per Session: 0 min  Stress: No Stress Concern Present (09/11/2023)   [employer] of Occupational Health - Occupational Stress Questionnaire    Feeling of Stress : Not at all  Social Connections: Moderately Integrated (09/11/2023)   Social Connection and Isolation Panel [NHANES]    Frequency of Communication with Friends and Family: More than three times a week    Frequency of Social Gatherings with Friends and Family: More than three times a week    Attends Religious Services: More than 4 times per year    Active Member of [employer] or Organizations: No    Attends Banker Meetings: Never    Marital Status: Married    Tobacco Counseling Ready to quit: Not Answered Counseling given: Not Answered Tobacco comments: Started smoking again in April.  1/2 ppd.      Clinical Intake:  Pre-visit preparation completed: Yes  Pain : 0-10 Pain Score: 8  Pain Type: Chronic pain Pain Location:  Ankle Pain Orientation: Left Pain Descriptors / Indicators: Aching Pain Onset: More than a  month ago Pain Frequency: Intermittent     Nutritional Risks: None Diabetes: Yes CBG done?: No Did pt. bring in CBG monitor from home?: No  Lab Results  Component Value Date   HGBA1C 7.2 (H) 06/15/2023   HGBA1C 8.7 (H) 01/14/2023   HGBA1C 10.5 (A) 06/14/2021     How often do you need to have someone help you when you read instructions, pamphlets, or other written materials from your doctor or pharmacy?: 1 - Never  Interpreter Needed?: No  Information entered by :: NAllen LPN   Activities of Daily Living     09/11/2023    9:39 AM 06/15/2023    1:24 PM  In your present state of health, do you have any difficulty performing the following activities:  Hearing? 0   Vision? 0   Difficulty concentrating or making decisions? 0   Walking or climbing stairs? 1   Comment due to ankle   Dressing or bathing? 0   Doing errands, shopping? 0 0  Preparing Food and eating ? N   Using the Toilet? N   In the past six months, have you accidently leaked urine? N   Do you have problems with loss of bowel control? N   Managing your Medications? Y   Comment someone sets up   Managing your Finances? N   Housekeeping or managing your Housekeeping? N     Patient Care Team: Senaida Dama, NP as PCP - General (Nurse Practitioner) Rolm Clos, Cathay Clonts, MD as PCP - Cardiology (Cardiology) Center, [facility]  Indicate any recent Medical Services you may have received from other than [facility] providers in the past year (date may be approximate).     Assessment:    This is a routine wellness examination for [facility].  Hearing/Vision screen Hearing Screening - Comments:: Denies hearing issues Vision Screening - Comments:: No regular eye exams   Goals Addressed             This Visit's Progress    Patient Stated       09/11/2023, maintaining BP       Depression Screen     09/11/2023     9:50 AM 06/14/2021    2:50 PM 05/21/2021   10:49 AM 01/29/2021   10:20 AM 11/20/2020    3:43 PM 04/24/2020    9:38 AM 01/20/2020    4:17 PM  PHQ 2/9 Scores  PHQ - 2 Score 0 2 2 3 1 3  0  PHQ- 9 Score  7 7 9 5 10 5     Fall Risk     09/11/2023    9:48 AM 01/20/2020    4:17 PM 11/28/2019   11:41 AM  Fall Risk   Falls in the past year? 1 0 0  Comment tripped    Number falls in past yr: 0    Injury with Fall? 0    Risk for fall due to : Medication side effect;Impaired mobility;Impaired balance/gait  No Fall Risks  Follow up Falls evaluation completed;Falls prevention discussed  Falls evaluation completed    MEDICARE RISK AT HOME:  Medicare Risk at Home Any stairs in or around the home?: Yes If so, are there any without handrails?: Yes Home free of loose throw rugs in walkways, pet beds, electrical cords, etc?: No Adequate lighting in your home to reduce risk of falls?: No Life alert?: No Use of a cane, walker or w/c?: No Grab bars in the bathroom?: No Shower chair or bench in shower?: No  Elevated toilet seat or a handicapped toilet?: No  TIMED UP AND GO:  Was the test performed?  No  Cognitive Function: 6CIT completed        09/11/2023    9:50 AM  6CIT Screen  What Year? 0 points  What month? 0 points  What time? 0 points  Count back from 20 0 points  Months in reverse 4 points  Repeat phrase 0 points  Total Score 4 points    Immunizations Immunization History  Administered Date(s) Administered   PFIZER(Purple Top)SARS-COV-2 Vaccination 12/24/2019, 01/14/2020   Pneumococcal Polysaccharide-23 02/13/2017   Tdap 01/14/2023    Screening Tests Health Maintenance  Topic Date Due   OPHTHALMOLOGY EXAM  Never done   Fecal DNA (Cologuard)  Never done   Pneumococcal Vaccine 24-66 Years old (2 of 2 - PCV) 02/13/2018   Zoster Vaccines- Shingrix (1 of 2) Never done   FOOT EXAM  04/25/2021   Lung Cancer Screening  08/04/2022   COVID-19 Vaccine (3 - 2024-25 season)  01/04/2023   HEMOGLOBIN A1C  09/12/2023   INFLUENZA VACCINE  12/04/2023   Medicare Annual Wellness (AWV)  09/10/2024   DTaP/Tdap/Td (2 - Td or Tdap) 01/13/2033   Hepatitis C Screening  Completed   HIV Screening  Completed   HPV VACCINES  Aged Out   Meningococcal B Vaccine  Aged Out    Health Maintenance  Health Maintenance Due  Topic Date Due   OPHTHALMOLOGY EXAM  Never done   Fecal DNA (Cologuard)  Never done   Pneumococcal Vaccine 40-54 Years old (2 of 2 - PCV) 02/13/2018   Zoster Vaccines- Shingrix (1 of 2) Never done   FOOT EXAM  04/25/2021   Lung Cancer Screening  08/04/2022   COVID-19 Vaccine (3 - 2024-25 season) 01/04/2023   Health Maintenance Items Addressed: Cologuard Ordered, Declines vaccines, but had TDAP. Declines lung screening. Will make appointment for eyes  Additional Screening:  Vision Screening: Recommended annual ophthalmology exams for early detection of glaucoma and other disorders of the eye.  Dental Screening: Recommended annual dental exams for proper oral hygiene  Community Resource Referral / Chronic Care Management: CRR required this visit?  No   CCM required this visit?  No   Plan:    I have personally reviewed and noted the following in the patient's chart:   Medical and social history Use of alcohol, tobacco or illicit drugs  Current medications and supplements including opioid prescriptions. Patient is not currently taking opioid prescriptions. Functional ability and status Nutritional status Physical activity Advanced directives List of other physicians Hospitalizations, surgeries, and ER visits in previous 12 months Vitals Screenings to include cognitive, depression, and falls Referrals and appointments  In addition, I have reviewed and discussed with patient certain preventive protocols, quality metrics, and best practice recommendations. A written personalized care plan for preventive services as well as general preventive  health recommendations were provided to patient.   Areatha Beecham, LPN   05/10/1094   After Visit Summary: (MyChart) Due to this being a telephonic visit, the after visit summary with patients personalized plan was offered to patient via MyChart   Notes: Nothing significant to report at this time.

## 2023-09-11 NOTE — Patient Instructions (Signed)
 Matthew Odom , Thank you for taking time out of your busy schedule to complete your Annual Wellness Visit with me. I enjoyed our conversation and look forward to speaking with you again next year. I, as well as your care team,  appreciate your ongoing commitment to your health goals. Please review the following plan we discussed and let me know if I can assist you in the future. Your Game plan/ To Do List    Referrals: If you haven't heard from the office you've been referred to, please reach out to them at the phone provided.  N/a Follow up Visits: Next Medicare AWV with our clinical staff: 10/06/2024 at 3:40   Have you seen your provider in the last 6 months (3 months if uncontrolled diabetes)? No Next Office Visit with your provider: 10/09/2023 at 9:20  Clinician Recommendations:  Aim for 30 minutes of exercise or brisk walking, 6-8 glasses of water , and 5 servings of fruits and vegetables each day.       This is a list of the screening recommended for you and due dates:  Health Maintenance  Topic Date Due   Eye exam for diabetics  Never done   Cologuard (Stool DNA test)  Never done   Pneumococcal Vaccination (2 of 2 - PCV) 02/13/2018   Zoster (Shingles) Vaccine (1 of 2) Never done   Complete foot exam   04/25/2021   Screening for Lung Cancer  08/04/2022   COVID-19 Vaccine (3 - 2024-25 season) 01/04/2023   Hemoglobin A1C  09/12/2023   Flu Shot  12/04/2023   Medicare Annual Wellness Visit  09/10/2024   DTaP/Tdap/Td vaccine (2 - Td or Tdap) 01/13/2033   Hepatitis C Screening  Completed   HIV Screening  Completed   HPV Vaccine  Aged Out   Meningitis B Vaccine  Aged Out    Advanced directives: (ACP Link)Information on Advanced Care Planning can be found at [place]  Print production planner Health Care Directives Advance Health Care Directives. [email or web address]  Advance Care Planning is important because it:  [x]  Makes sure you receive the medical care that is consistent with your values,  goals, and preferences  [x]  It provides guidance to your family and loved ones and reduces their decisional burden about whether or not they are making the right decisions based on your wishes.  Follow the link provided in your after visit summary or read over the paperwork we have mailed to you to help you started getting your Advance Directives in place. If you need assistance in completing these, please reach out to us  so that we can help you!  See attachments for Preventive Care and Fall Prevention Tips.

## 2023-09-12 DIAGNOSIS — Z992 Dependence on renal dialysis: Secondary | ICD-10-CM | POA: Diagnosis not present

## 2023-09-12 DIAGNOSIS — N2581 Secondary hyperparathyroidism of renal origin: Secondary | ICD-10-CM | POA: Diagnosis not present

## 2023-09-12 DIAGNOSIS — N186 End stage renal disease: Secondary | ICD-10-CM | POA: Diagnosis not present

## 2023-09-14 DIAGNOSIS — S82842D Displaced bimalleolar fracture of left lower leg, subsequent encounter for closed fracture with routine healing: Secondary | ICD-10-CM | POA: Diagnosis not present

## 2023-09-15 DIAGNOSIS — N186 End stage renal disease: Secondary | ICD-10-CM | POA: Diagnosis not present

## 2023-09-15 DIAGNOSIS — Z992 Dependence on renal dialysis: Secondary | ICD-10-CM | POA: Diagnosis not present

## 2023-09-15 DIAGNOSIS — N2581 Secondary hyperparathyroidism of renal origin: Secondary | ICD-10-CM | POA: Diagnosis not present

## 2023-09-17 DIAGNOSIS — N2581 Secondary hyperparathyroidism of renal origin: Secondary | ICD-10-CM | POA: Diagnosis not present

## 2023-09-17 DIAGNOSIS — N186 End stage renal disease: Secondary | ICD-10-CM | POA: Diagnosis not present

## 2023-09-17 DIAGNOSIS — Z992 Dependence on renal dialysis: Secondary | ICD-10-CM | POA: Diagnosis not present

## 2023-09-19 DIAGNOSIS — N2581 Secondary hyperparathyroidism of renal origin: Secondary | ICD-10-CM | POA: Diagnosis not present

## 2023-09-19 DIAGNOSIS — N186 End stage renal disease: Secondary | ICD-10-CM | POA: Diagnosis not present

## 2023-09-19 DIAGNOSIS — Z992 Dependence on renal dialysis: Secondary | ICD-10-CM | POA: Diagnosis not present

## 2023-09-22 DIAGNOSIS — Z992 Dependence on renal dialysis: Secondary | ICD-10-CM | POA: Diagnosis not present

## 2023-09-22 DIAGNOSIS — N2581 Secondary hyperparathyroidism of renal origin: Secondary | ICD-10-CM | POA: Diagnosis not present

## 2023-09-22 DIAGNOSIS — N186 End stage renal disease: Secondary | ICD-10-CM | POA: Diagnosis not present

## 2023-09-24 DIAGNOSIS — Z992 Dependence on renal dialysis: Secondary | ICD-10-CM | POA: Diagnosis not present

## 2023-09-24 DIAGNOSIS — N186 End stage renal disease: Secondary | ICD-10-CM | POA: Diagnosis not present

## 2023-09-24 DIAGNOSIS — N2581 Secondary hyperparathyroidism of renal origin: Secondary | ICD-10-CM | POA: Diagnosis not present

## 2023-09-26 DIAGNOSIS — N2581 Secondary hyperparathyroidism of renal origin: Secondary | ICD-10-CM | POA: Diagnosis not present

## 2023-09-26 DIAGNOSIS — N186 End stage renal disease: Secondary | ICD-10-CM | POA: Diagnosis not present

## 2023-09-26 DIAGNOSIS — Z992 Dependence on renal dialysis: Secondary | ICD-10-CM | POA: Diagnosis not present

## 2023-09-29 DIAGNOSIS — N186 End stage renal disease: Secondary | ICD-10-CM | POA: Diagnosis not present

## 2023-09-29 DIAGNOSIS — N2581 Secondary hyperparathyroidism of renal origin: Secondary | ICD-10-CM | POA: Diagnosis not present

## 2023-09-29 DIAGNOSIS — Z992 Dependence on renal dialysis: Secondary | ICD-10-CM | POA: Diagnosis not present

## 2023-10-01 ENCOUNTER — Other Ambulatory Visit: Payer: Self-pay | Admitting: Orthopaedic Surgery

## 2023-10-01 DIAGNOSIS — S82842D Displaced bimalleolar fracture of left lower leg, subsequent encounter for closed fracture with routine healing: Secondary | ICD-10-CM

## 2023-10-02 ENCOUNTER — Ambulatory Visit
Admission: RE | Admit: 2023-10-02 | Discharge: 2023-10-02 | Disposition: A | Discharge status: Home / Self Care | Source: Ambulatory Visit | Attending: Orthopaedic Surgery | Admitting: Orthopaedic Surgery

## 2023-10-02 DIAGNOSIS — M7662 Achilles tendinitis, left leg: Secondary | ICD-10-CM | POA: Diagnosis not present

## 2023-10-02 DIAGNOSIS — S82842D Displaced bimalleolar fracture of left lower leg, subsequent encounter for closed fracture with routine healing: Secondary | ICD-10-CM

## 2023-10-02 DIAGNOSIS — S82832D Other fracture of upper and lower end of left fibula, subsequent encounter for closed fracture with routine healing: Secondary | ICD-10-CM | POA: Diagnosis not present

## 2023-10-03 DIAGNOSIS — N186 End stage renal disease: Secondary | ICD-10-CM | POA: Diagnosis not present

## 2023-10-03 DIAGNOSIS — Z992 Dependence on renal dialysis: Secondary | ICD-10-CM | POA: Diagnosis not present

## 2023-10-03 DIAGNOSIS — E1129 Type 2 diabetes mellitus with other diabetic kidney complication: Secondary | ICD-10-CM | POA: Diagnosis not present

## 2023-10-03 DIAGNOSIS — N2581 Secondary hyperparathyroidism of renal origin: Secondary | ICD-10-CM | POA: Diagnosis not present

## 2023-10-05 DIAGNOSIS — S82842D Displaced bimalleolar fracture of left lower leg, subsequent encounter for closed fracture with routine healing: Secondary | ICD-10-CM | POA: Diagnosis not present

## 2023-10-09 ENCOUNTER — Ambulatory Visit: Admitting: Family

## 2023-10-09 ENCOUNTER — Encounter: Payer: Self-pay | Admitting: Family

## 2023-10-10 DIAGNOSIS — N186 End stage renal disease: Secondary | ICD-10-CM | POA: Diagnosis not present

## 2023-10-10 DIAGNOSIS — Z992 Dependence on renal dialysis: Secondary | ICD-10-CM | POA: Diagnosis not present

## 2023-10-10 DIAGNOSIS — N2581 Secondary hyperparathyroidism of renal origin: Secondary | ICD-10-CM | POA: Diagnosis not present

## 2023-10-13 ENCOUNTER — Ambulatory Visit: Admitting: Physician Assistant

## 2023-10-13 ENCOUNTER — Telehealth: Payer: Self-pay | Admitting: Family

## 2023-10-13 DIAGNOSIS — N186 End stage renal disease: Secondary | ICD-10-CM | POA: Diagnosis not present

## 2023-10-13 DIAGNOSIS — Z992 Dependence on renal dialysis: Secondary | ICD-10-CM | POA: Diagnosis not present

## 2023-10-13 DIAGNOSIS — N2581 Secondary hyperparathyroidism of renal origin: Secondary | ICD-10-CM | POA: Diagnosis not present

## 2023-10-13 NOTE — Telephone Encounter (Signed)
 Called pt and left vm to call office back to reschedule missed appt

## 2023-10-14 ENCOUNTER — Encounter (HOSPITAL_BASED_OUTPATIENT_CLINIC_OR_DEPARTMENT_OTHER): Payer: Self-pay

## 2023-10-14 ENCOUNTER — Emergency Department (HOSPITAL_BASED_OUTPATIENT_CLINIC_OR_DEPARTMENT_OTHER)
Admission: EM | Admit: 2023-10-14 | Discharge: 2023-10-14 | Disposition: A | Discharge status: Home / Self Care | Attending: Emergency Medicine | Admitting: Emergency Medicine

## 2023-10-14 DIAGNOSIS — Z794 Long term (current) use of insulin: Secondary | ICD-10-CM | POA: Diagnosis not present

## 2023-10-14 DIAGNOSIS — Z79899 Other long term (current) drug therapy: Secondary | ICD-10-CM | POA: Insufficient documentation

## 2023-10-14 DIAGNOSIS — Z7982 Long term (current) use of aspirin: Secondary | ICD-10-CM | POA: Diagnosis not present

## 2023-10-14 DIAGNOSIS — I10 Essential (primary) hypertension: Secondary | ICD-10-CM | POA: Insufficient documentation

## 2023-10-14 DIAGNOSIS — L0501 Pilonidal cyst with abscess: Secondary | ICD-10-CM | POA: Diagnosis not present

## 2023-10-14 MED ORDER — DOXYCYCLINE HYCLATE 100 MG PO CAPS: 100.0000 mg | ORAL_CAPSULE | Freq: Two times a day (BID) | ORAL | 0 refills | Status: DC

## 2023-10-14 MED ORDER — OXYCODONE HCL 5 MG PO TABS: 5.0000 mg | ORAL_TABLET | ORAL | 0 refills | Status: DC | PRN

## 2023-10-14 NOTE — ED Notes (Signed)
 Describes non-draining pilonidal cyst. Started swelling yesterday. Denies fevers, N/V, body aches.

## 2023-10-14 NOTE — Discharge Instructions (Addendum)
 You were seen for your abscess in the emergency department.   At home, please take tylenol  for your pain. You may also take the oxycodone  we have prescribed you for any breakthrough pain that may have.  Do not take this before driving or operating heavy machinery.  Do not take this medication with alcohol.  Take the antibiotics we prescribed you.   Check your MyChart online for the results of any tests that had not resulted by the time you left the emergency department.   Follow-up with your primary doctor in 2-3 days regarding your visit and to have the packing removed.   Return immediately to the emergency department if you experience any of the following: fevers, worsening pain, or any other concerning symptoms.    Thank you for visiting our Emergency Department. It was a pleasure taking care of you today.

## 2023-10-14 NOTE — ED Triage Notes (Addendum)
 Pt states he has an abscess or cyst.  States he can not sit down in triage.  Noticed it yesterday Pt states he had an abscess in that area before

## 2023-10-14 NOTE — ED Provider Notes (Signed)
 [place] EMERGENCY DEPARTMENT AT [facility] Provider Note   CSN: [id number] Arrival date & time: 10/14/23  1934     History  Chief Complaint  Patient presents with   Abscess    Matthew Odom is a 53 y.o. male.  53 year old male with history of pilonidal abscess who presents emergency department with the same.  Patient reports that since yesterday has been having swelling near his tailbone that has become more painful.  No fevers or chills.  Has had an abscess drained in that area before.       Home Medications Prior to Admission medications   Medication Sig Start Date End Date Taking? Authorizing Provider  doxycycline  (VIBRAMYCIN ) 100 MG capsule Take 1 capsule (100 mg total) by mouth 2 (two) times daily. 10/14/23  Yes Ninetta Basket, MD  acetaminophen  (TYLENOL ) 500 MG tablet Take 500-1,000 mg by mouth every 6 (six) hours as needed (pain.).    [provider]  albuterol  (PROVENTIL ) (2.5 MG/3ML) 0.083% nebulizer solution Take 3 mLs (2.5 mg total) by nebulization every 6 (six) hours as needed for wheezing or shortness of breath. 01/14/23   Senaida Dama, NP  albuterol  (PROVENTIL ) (2.5 MG/3ML) 0.083% nebulizer solution Take 3 mLs (2.5 mg total) by nebulization every 6 (six) hours as needed for wheezing or shortness of breath. 05/25/23   Burnette Carte, MD  amLODipine  (NORVASC ) 10 MG tablet Take 1 tablet (10 mg total) by mouth daily. 12/05/22   O'NealCathay Clonts, MD  aspirin  EC 81 MG tablet Take one tablet by mouth twice daily for 30 days after sugery for blood clot prevention. Start post op day 1. Swallow whole. 06/18/23   [place], Jesse J, PA-C  atorvastatin  (LIPITOR) 80 MG tablet Take 1 tablet (80 mg total) by mouth daily. Patient not taking: Reported on 06/15/2023 11/13/21 01/14/23  Akula, Vijaya, MD  budesonide -formoterol  (SYMBICORT ) 160-4.5 MCG/ACT inhaler Inhale 2 puffs into the lungs 2 (two) times daily. 01/14/23   Senaida Dama, NP  calcium  acetate  (PHOSLO ) 667 MG capsule Take 2 capsules (1,334 mg total) by mouth 3 (three) times daily with meals 06/25/21     Calcium  Carbonate Antacid (TUMS PO) Take 2 tablets by mouth 3 (three) times daily with meals. Patient not taking: Reported on 09/11/2023    [provider]  carvedilol  (COREG ) 25 MG tablet Take 1 tablet (25 mg total) by mouth 2 (two) times daily with a meal. 12/05/22   O'Neal, Cathay Clonts, MD  doxazosin  (CARDURA ) 4 MG tablet Take 12 mg by mouth at bedtime. Patient not taking: Reported on 09/11/2023    [provider]  doxazosin  (CARDURA ) 8 MG tablet Take 1 tablet (8 mg total) by mouth daily. 12/05/22   O'NealCathay Clonts, MD  hydrALAZINE  (APRESOLINE ) 100 MG tablet Take 1 tablet (100 mg total) by mouth 3 (three) times daily. 12/05/22   O'NealCathay Clonts, MD  Insulin  Glargine (BASAGLAR  KWIKPEN) 100 UNIT/ML Inject 20 Units into the skin daily. Patient taking differently: Inject 10 Units into the skin 2 (two) times daily. 06/19/21   Shamleffer, Ibtehal Jaralla, MD  insulin  lispro (HUMALOG ) 100 UNIT/ML injection Inject 0-4 Units into the skin 3 (three) times daily as needed for high blood sugar.    [provider]  Insulin  Pen Needle 31G X 6 MM MISC Use as directed in the morning, at noon, in the evening, and at bedtime. 06/19/21   Shamleffer, Julian Obey, MD  liraglutide  (VICTOZA ) 18 MG/3ML SOPN Inject 1.8  mg into the skin daily. 06/19/21   Shamleffer, Julian Obey, MD  Misc. Devices MISC Please provide patient with insurance approved CPAP with the following settings:  autopap 15-20.  Large size [employer] Full Face Mask Forma mask and heated humidification. 12/25/19   Fleming, Zelda W, NP  nicotine  (NICODERM CQ  - DOSED IN MG/24 HOURS) 14 mg/24hr patch Place 1 patch (14 mg total) onto the skin daily as needed. Patient not taking: Reported on 09/11/2023 01/14/23   Senaida Dama, NP  nitroGLYCERIN  (NITROSTAT ) 0.4 MG SL tablet Place 1 tablet (0.4 mg total) under the  tongue every 5 (five) minutes as needed for up to 30 doses for chest pain. Patient not taking: Reported on 09/11/2023 07/16/21   Arvilla Birmingham, MD  oxyCODONE  (ROXICODONE ) 5 MG immediate release tablet Take 1 tablet (5 mg total) by mouth every 4 (four) hours as needed (for post op pain). 10/14/23   Ninetta Basket, MD  sertraline  (ZOLOFT ) 25 MG tablet Take 1 tablet (25 mg total) by mouth daily. Patient not taking: Reported on 09/11/2023 01/14/23   Senaida Dama, NP      Allergies    Aspirin  and Nitroglycerin     Review of Systems   Review of Systems  Physical Exam Updated Vital Signs BP (!) 234/113   Pulse (!) 114   Temp 99.7 F (37.6 C) (Oral)   Resp 20   Ht 5' 11 (1.803 m)   Wt 115.7 kg   SpO2 97%   BMI 35.57 kg/m  Physical Exam Genitourinary:    Comments: 2 cm fluctuant area superior to gluteal cleft palpated that is consistent with pilonidal cyst/abscess.  No drainage.  Mild overlying erythema.    ED Results / Procedures / Treatments   Labs (all labs ordered are listed, but only abnormal results are displayed) Labs Reviewed - No data to display  EKG None  Radiology No results found.  Procedures .Incision and Drainage  Date/Time: 10/15/2023 3:25 PM  Performed by: Ninetta Basket, MD Authorized by: Ninetta Basket, MD   Consent:    Consent obtained:  Verbal   Consent given by:  Patient   Risks discussed:  Bleeding, incomplete drainage, pain and infection Universal protocol:    Patient identity confirmed:  Verbally with patient Location:    Type:  Pilonidal cyst   Size:  2   Location:  Anogenital   Anogenital location:  Pilonidal Pre-procedure details:    Skin preparation:  Chlorhexidine  Sedation:    Sedation type:  None Anesthesia:    Anesthesia method:  Local infiltration   Local anesthetic:  Lidocaine  2% WITH epi Procedure type:    Complexity:  Simple Procedure details:    Ultrasound guidance: yes     Incision types:  Single straight    Wound management:  Probed and deloculated   Drainage:  Bloody and purulent   Drainage amount:  Moderate   Wound treatment:  Wound left open   Packing materials:  1/4 in gauze Post-procedure details:    Procedure completion:  Tolerated well, no immediate complications    EMERGENCY DEPARTMENT US  SOFT TISSUE INTERPRETATION Study: Limited Soft Tissue Ultrasound  INDICATIONS: Soft tissue infection Multiple views of the body part were obtained in real-time with a multi-frequency linear probe  PERFORMED BY: Myself IMAGES ARCHIVED?: Yes SIDE:Midline BODY PART:Lower back INTERPRETATION:  Abcess present    Medications Ordered in ED Medications - No data to display  ED Course/ Medical Decision Making/ A&P  Medical Decision Making Risk Prescription drug management.   53 year old male with history of pilonidal abscesses who presents emergency department for pilonidal abscess again.  It was drained and packed.  He is not septic at this time so do not feel he needs admission for IV antibiotics.  Markedly hypertensive but says that he has not taken his blood pressure medication today and feels that that is likely why.  Discharged home with antibiotics and instructed to follow-up in several days to have the packing removed.   Final Clinical Impression(s) / ED Diagnoses Final diagnoses:  Pilonidal abscess    Rx / DC Orders ED Discharge Orders          Ordered    doxycycline  (VIBRAMYCIN ) 100 MG capsule  2 times daily        10/14/23 2143    oxyCODONE  (ROXICODONE ) 5 MG immediate release tablet  Every 4 hours PRN        10/14/23 2143              Ninetta Basket, MD 10/15/23 1527

## 2023-10-17 DIAGNOSIS — N186 End stage renal disease: Secondary | ICD-10-CM | POA: Diagnosis not present

## 2023-10-17 DIAGNOSIS — N2581 Secondary hyperparathyroidism of renal origin: Secondary | ICD-10-CM | POA: Diagnosis not present

## 2023-10-17 DIAGNOSIS — Z992 Dependence on renal dialysis: Secondary | ICD-10-CM | POA: Diagnosis not present

## 2023-10-20 DIAGNOSIS — N2581 Secondary hyperparathyroidism of renal origin: Secondary | ICD-10-CM | POA: Diagnosis not present

## 2023-10-20 DIAGNOSIS — Z992 Dependence on renal dialysis: Secondary | ICD-10-CM | POA: Diagnosis not present

## 2023-10-20 DIAGNOSIS — N186 End stage renal disease: Secondary | ICD-10-CM | POA: Diagnosis not present

## 2023-10-24 DIAGNOSIS — N2581 Secondary hyperparathyroidism of renal origin: Secondary | ICD-10-CM | POA: Diagnosis not present

## 2023-10-24 DIAGNOSIS — Z992 Dependence on renal dialysis: Secondary | ICD-10-CM | POA: Diagnosis not present

## 2023-10-24 DIAGNOSIS — N186 End stage renal disease: Secondary | ICD-10-CM | POA: Diagnosis not present

## 2023-10-29 DIAGNOSIS — N186 End stage renal disease: Secondary | ICD-10-CM | POA: Diagnosis not present

## 2023-10-29 DIAGNOSIS — N2581 Secondary hyperparathyroidism of renal origin: Secondary | ICD-10-CM | POA: Diagnosis not present

## 2023-10-29 DIAGNOSIS — Z992 Dependence on renal dialysis: Secondary | ICD-10-CM | POA: Diagnosis not present

## 2023-11-02 DIAGNOSIS — Z992 Dependence on renal dialysis: Secondary | ICD-10-CM | POA: Diagnosis not present

## 2023-11-02 DIAGNOSIS — E1129 Type 2 diabetes mellitus with other diabetic kidney complication: Secondary | ICD-10-CM | POA: Diagnosis not present

## 2023-11-02 DIAGNOSIS — N186 End stage renal disease: Secondary | ICD-10-CM | POA: Diagnosis not present

## 2023-11-04 ENCOUNTER — Ambulatory Visit: Admitting: Podiatry

## 2023-11-05 DIAGNOSIS — N2581 Secondary hyperparathyroidism of renal origin: Secondary | ICD-10-CM | POA: Diagnosis not present

## 2023-11-05 DIAGNOSIS — Z992 Dependence on renal dialysis: Secondary | ICD-10-CM | POA: Diagnosis not present

## 2023-11-05 DIAGNOSIS — N186 End stage renal disease: Secondary | ICD-10-CM | POA: Diagnosis not present

## 2023-11-08 ENCOUNTER — Other Ambulatory Visit: Payer: Self-pay

## 2023-11-08 ENCOUNTER — Encounter (HOSPITAL_BASED_OUTPATIENT_CLINIC_OR_DEPARTMENT_OTHER): Payer: Self-pay

## 2023-11-08 ENCOUNTER — Observation Stay (HOSPITAL_BASED_OUTPATIENT_CLINIC_OR_DEPARTMENT_OTHER)
Admission: EM | Admit: 2023-11-08 | Discharge: 2023-11-09 | Disposition: A | Discharge status: Home / Self Care | Attending: Internal Medicine | Admitting: Internal Medicine

## 2023-11-08 ENCOUNTER — Emergency Department (HOSPITAL_BASED_OUTPATIENT_CLINIC_OR_DEPARTMENT_OTHER)

## 2023-11-08 DIAGNOSIS — R0600 Dyspnea, unspecified: Secondary | ICD-10-CM

## 2023-11-08 DIAGNOSIS — I132 Hypertensive heart and chronic kidney disease with heart failure and with stage 5 chronic kidney disease, or end stage renal disease: Secondary | ICD-10-CM | POA: Diagnosis not present

## 2023-11-08 DIAGNOSIS — Z1152 Encounter for screening for COVID-19: Secondary | ICD-10-CM | POA: Diagnosis not present

## 2023-11-08 DIAGNOSIS — Z7982 Long term (current) use of aspirin: Secondary | ICD-10-CM | POA: Diagnosis not present

## 2023-11-08 DIAGNOSIS — I5033 Acute on chronic diastolic (congestive) heart failure: Secondary | ICD-10-CM | POA: Diagnosis not present

## 2023-11-08 DIAGNOSIS — I120 Hypertensive chronic kidney disease with stage 5 chronic kidney disease or end stage renal disease: Secondary | ICD-10-CM | POA: Diagnosis not present

## 2023-11-08 DIAGNOSIS — E785 Hyperlipidemia, unspecified: Secondary | ICD-10-CM | POA: Diagnosis not present

## 2023-11-08 DIAGNOSIS — N186 End stage renal disease: Secondary | ICD-10-CM | POA: Diagnosis not present

## 2023-11-08 DIAGNOSIS — D638 Anemia in other chronic diseases classified elsewhere: Secondary | ICD-10-CM | POA: Diagnosis present

## 2023-11-08 DIAGNOSIS — Z792 Long term (current) use of antibiotics: Secondary | ICD-10-CM | POA: Diagnosis not present

## 2023-11-08 DIAGNOSIS — J449 Chronic obstructive pulmonary disease, unspecified: Secondary | ICD-10-CM | POA: Diagnosis not present

## 2023-11-08 DIAGNOSIS — J810 Acute pulmonary edema: Secondary | ICD-10-CM | POA: Diagnosis not present

## 2023-11-08 DIAGNOSIS — I517 Cardiomegaly: Secondary | ICD-10-CM | POA: Diagnosis not present

## 2023-11-08 DIAGNOSIS — E8779 Other fluid overload: Principal | ICD-10-CM

## 2023-11-08 DIAGNOSIS — E8770 Fluid overload, unspecified: Secondary | ICD-10-CM | POA: Diagnosis not present

## 2023-11-08 DIAGNOSIS — I152 Hypertension secondary to endocrine disorders: Secondary | ICD-10-CM | POA: Diagnosis present

## 2023-11-08 DIAGNOSIS — R079 Chest pain, unspecified: Secondary | ICD-10-CM | POA: Diagnosis not present

## 2023-11-08 DIAGNOSIS — R7989 Other specified abnormal findings of blood chemistry: Secondary | ICD-10-CM | POA: Diagnosis not present

## 2023-11-08 DIAGNOSIS — I10 Essential (primary) hypertension: Secondary | ICD-10-CM | POA: Diagnosis not present

## 2023-11-08 DIAGNOSIS — I428 Other cardiomyopathies: Secondary | ICD-10-CM | POA: Insufficient documentation

## 2023-11-08 DIAGNOSIS — J811 Chronic pulmonary edema: Secondary | ICD-10-CM | POA: Diagnosis not present

## 2023-11-08 DIAGNOSIS — Z79899 Other long term (current) drug therapy: Secondary | ICD-10-CM | POA: Diagnosis not present

## 2023-11-08 DIAGNOSIS — R0602 Shortness of breath: Secondary | ICD-10-CM | POA: Diagnosis present

## 2023-11-08 DIAGNOSIS — E1122 Type 2 diabetes mellitus with diabetic chronic kidney disease: Secondary | ICD-10-CM | POA: Insufficient documentation

## 2023-11-08 DIAGNOSIS — D649 Anemia, unspecified: Secondary | ICD-10-CM | POA: Diagnosis not present

## 2023-11-08 DIAGNOSIS — Z992 Dependence on renal dialysis: Secondary | ICD-10-CM | POA: Diagnosis not present

## 2023-11-08 DIAGNOSIS — R002 Palpitations: Secondary | ICD-10-CM | POA: Diagnosis not present

## 2023-11-08 DIAGNOSIS — E1159 Type 2 diabetes mellitus with other circulatory complications: Secondary | ICD-10-CM | POA: Diagnosis present

## 2023-11-08 DIAGNOSIS — E1169 Type 2 diabetes mellitus with other specified complication: Secondary | ICD-10-CM | POA: Diagnosis present

## 2023-11-08 DIAGNOSIS — N400 Enlarged prostate without lower urinary tract symptoms: Secondary | ICD-10-CM | POA: Insufficient documentation

## 2023-11-08 DIAGNOSIS — D631 Anemia in chronic kidney disease: Secondary | ICD-10-CM | POA: Diagnosis not present

## 2023-11-08 DIAGNOSIS — F419 Anxiety disorder, unspecified: Secondary | ICD-10-CM | POA: Diagnosis not present

## 2023-11-08 LAB — COMPREHENSIVE METABOLIC PANEL WITH GFR
ALT: 11 U/L (ref 0–44)
AST: 15 U/L (ref 15–41)
Albumin: 4.2 g/dL (ref 3.5–5.0)
Alkaline Phosphatase: 69 U/L (ref 38–126)
Anion gap: 20 — ABNORMAL HIGH (ref 5–15)
BUN: 84 mg/dL — ABNORMAL HIGH (ref 6–20)
CO2: 20 mmol/L — ABNORMAL LOW (ref 22–32)
Calcium: 8.5 mg/dL — ABNORMAL LOW (ref 8.9–10.3)
Chloride: 100 mmol/L (ref 98–111)
Creatinine, Ser: 13.5 mg/dL — ABNORMAL HIGH (ref 0.61–1.24)
GFR, Estimated: 4 mL/min — ABNORMAL LOW (ref >60)
Glucose, Bld: 163 mg/dL — ABNORMAL HIGH (ref 70–99)
Potassium: 4 mmol/L (ref 3.5–5.1)
Sodium: 140 mmol/L (ref 135–145)
Total Bilirubin: 0.2 mg/dL (ref 0.0–1.2)
Total Protein: 7.7 g/dL (ref 6.5–8.1)

## 2023-11-08 LAB — CBC WITH DIFFERENTIAL/PLATELET
Abs Immature Granulocytes: 0.02 K/uL (ref 0.00–0.07)
Basophils Absolute: 0 K/uL (ref 0.0–0.1)
Basophils Relative: 0 %
Eosinophils Absolute: 0.2 K/uL (ref 0.0–0.5)
Eosinophils Relative: 2 %
HCT: 31.2 % — ABNORMAL LOW (ref 39.0–52.0)
Hemoglobin: 9.8 g/dL — ABNORMAL LOW (ref 13.0–17.0)
Immature Granulocytes: 0 %
Lymphocytes Relative: 22 %
Lymphs Abs: 2.3 K/uL (ref 0.7–4.0)
MCH: 27.2 pg (ref 26.0–34.0)
MCHC: 31.4 g/dL (ref 30.0–36.0)
MCV: 86.7 fL (ref 80.0–100.0)
Monocytes Absolute: 0.8 K/uL (ref 0.1–1.0)
Monocytes Relative: 8 %
Neutro Abs: 6.8 K/uL (ref 1.7–7.7)
Neutrophils Relative %: 68 %
Platelets: 238 K/uL (ref 150–400)
RBC: 3.6 MIL/uL — ABNORMAL LOW (ref 4.22–5.81)
RDW: 14.8 % (ref 11.5–15.5)
WBC: 10.1 K/uL (ref 4.0–10.5)
nRBC: 0 % (ref 0.0–0.2)

## 2023-11-08 LAB — I-STAT VENOUS BLOOD GAS, ED
Acid-base deficit: 4 mmol/L — ABNORMAL HIGH (ref 0.0–2.0)
Bicarbonate: 21.4 mmol/L (ref 20.0–28.0)
Calcium, Ion: 0.98 mmol/L — ABNORMAL LOW (ref 1.15–1.40)
HCT: 31 % — ABNORMAL LOW (ref 39.0–52.0)
Hemoglobin: 10.5 g/dL — ABNORMAL LOW (ref 13.0–17.0)
O2 Saturation: 79 %
Patient temperature: 98.2
Potassium: 3.8 mmol/L (ref 3.5–5.1)
Sodium: 139 mmol/L (ref 135–145)
TCO2: 23 mmol/L (ref 22–32)
pCO2, Ven: 39.7 mmHg — ABNORMAL LOW (ref 44–60)
pH, Ven: 7.338 (ref 7.25–7.43)
pO2, Ven: 45 mmHg (ref 32–45)

## 2023-11-08 LAB — RESP PANEL BY RT-PCR (RSV, FLU A&B, COVID)  RVPGX2
Influenza A by PCR: NEGATIVE
Influenza B by PCR: NEGATIVE
Resp Syncytial Virus by PCR: NEGATIVE
SARS Coronavirus 2 by RT PCR: NEGATIVE

## 2023-11-08 LAB — PRO BRAIN NATRIURETIC PEPTIDE: Pro Brain Natriuretic Peptide: 13877 pg/mL — ABNORMAL HIGH (ref <300.0)

## 2023-11-08 LAB — TROPONIN T, HIGH SENSITIVITY
Troponin T High Sensitivity: 215 ng/L (ref <19)
Troponin T High Sensitivity: 217 ng/L (ref <19)

## 2023-11-08 MED ORDER — HEPARIN SODIUM (PORCINE) 5000 UNIT/ML IJ SOLN
5000.0000 [IU] | Freq: Three times a day (TID) | INTRAMUSCULAR | Status: DC
  Administered 2023-11-09: 5000 [IU] via SUBCUTANEOUS
  Filled 2023-11-08: qty 1

## 2023-11-08 MED ORDER — CARVEDILOL 12.5 MG PO TABS
25.0000 mg | ORAL_TABLET | Freq: Once | ORAL | Status: AC
  Administered 2023-11-08: 25 mg via ORAL
  Filled 2023-11-08: qty 2

## 2023-11-08 MED ORDER — SENNOSIDES-DOCUSATE SODIUM 8.6-50 MG PO TABS: 1.0000 | ORAL_TABLET | Freq: Every evening | ORAL | Status: DC | PRN

## 2023-11-08 MED ORDER — ONDANSETRON HCL 4 MG PO TABS
4.0000 mg | ORAL_TABLET | Freq: Four times a day (QID) | ORAL | Status: DC | PRN
Start: 2023-11-08 — End: 2023-11-09

## 2023-11-08 MED ORDER — CHLORHEXIDINE GLUCONATE CLOTH 2 % EX PADS
6.0000 | MEDICATED_PAD | Freq: Every day | CUTANEOUS | Status: DC
  Administered 2023-11-09: 6 via TOPICAL
  Filled 2023-11-08: qty 6

## 2023-11-08 MED ORDER — ACETAMINOPHEN 325 MG PO TABS: 650.0000 mg | ORAL_TABLET | Freq: Four times a day (QID) | ORAL | Status: DC | PRN

## 2023-11-08 MED ORDER — HYDRALAZINE HCL 20 MG/ML IJ SOLN
10.0000 mg | Freq: Once | INTRAMUSCULAR | Status: AC
  Administered 2023-11-08: 10 mg via INTRAVENOUS
  Filled 2023-11-08: qty 1

## 2023-11-08 MED ORDER — IPRATROPIUM-ALBUTEROL 0.5-2.5 (3) MG/3ML IN SOLN
3.0000 mL | Freq: Once | RESPIRATORY_TRACT | Status: AC
  Administered 2023-11-08: 3 mL via RESPIRATORY_TRACT
  Filled 2023-11-08: qty 3

## 2023-11-08 MED ORDER — ACETAMINOPHEN 650 MG RE SUPP: 650.0000 mg | Freq: Four times a day (QID) | RECTAL | Status: DC | PRN

## 2023-11-08 MED ORDER — SODIUM CHLORIDE 0.9% FLUSH
3.0000 mL | Freq: Two times a day (BID) | INTRAVENOUS | Status: DC
  Administered 2023-11-09 (×2): 3 mL via INTRAVENOUS

## 2023-11-08 MED ORDER — ONDANSETRON HCL 4 MG/2ML IJ SOLN: 4.0000 mg | Freq: Four times a day (QID) | INTRAMUSCULAR | Status: DC | PRN

## 2023-11-08 NOTE — ED Notes (Signed)
 Report called over to [place] ED and given to [facility]

## 2023-11-08 NOTE — ED Triage Notes (Signed)
 Pt c/o SHOB onset this AM, wake up & take breathing treatment, then it happens all over again. Advises 3 breathing tx today  Dialysis T,Th,Sat, advises last session Thursday, unable to obtain this weekend r/t holiday weekend.

## 2023-11-08 NOTE — ED Notes (Signed)
 Called to Pt room 12 for SOB, Pt visually in distress. BLB clear and dim throughout,  SATS 95 on room air, placed pt on 2lt [place] for comfort, Duo neb given tol well, placed pt on 15lt air with venti mask for flow. MD aware, Breathing easy er at this time

## 2023-11-08 NOTE — ED Notes (Signed)
 Matthew Odom, Matthew Odom aware that troponin is elevated at 215 and BNP is elevated at 13,877.

## 2023-11-08 NOTE — H&P (Signed)
 History and Physical    Matthew Odom FMW:[id number] DOB: 1970/08/21 DOA: 11/08/2023  PCP: Lorren Greig PARAS, NP  Patient coming from: Home  I have personally briefly reviewed patient's old medical records in [facility]  Chief Complaint: Shortness of breath  HPI: Matthew Odom is a 53 y.o. male with medical history significant for ESRD on TTS HD, NICM/HFrecEF, COPD, T2DM, HTN, HLD, anemia of chronic disease, BPH, OSA not using CPAP who presented to the ED for evaluation of shortness of breath.  Patient states he normally dialyzes Tuesday, Thursday, Saturday.  He said he missed yesterday (Saturday) dialysis session.  Today he has been experiencing new onset shortness of breath.  He denies chest pain or cough.  He has not seen any increased swelling in his lower extremities.  He says he still makes small amount of urine.  ED Course  Labs/Imaging on admission: I have personally reviewed following labs and imaging studies.  [facility].  Initial vitals showed BP 206/109, pulse 96, RR 14, temp 99.1 F, SpO2 99% on 2 L O2 via .  Labs showed troponin T 215 > 217, proBNP 13,877, BUN 84, creatinine 13.50, sodium 140, potassium 4.0, bicarb 20, serum glucose 163, LFTs within normal limits, WBC 10.1, hemoglobin 9.8, platelets 238.  SARS-CoV-2, influenza, RSV PCR negative.  Portable chest x-ray showed pulmonary edema with persistent cardiomegaly.  No focal consolidation, effusion, or pneumothorax.  Patient was given IV hydralazine  10 mg and oral Coreg  25 mg with improvement in blood pressure.  EDP consulted nephrology (Dr. Tobie) who recommended ED to ED transfer to [facility] and placed orders for emergent dialysis although unclear if HD can be performed overnight.  The hospitalist service was consulted to admit.  Review of Systems: All systems reviewed and are negative except as documented in history of present illness above.   Past Medical History:   Diagnosis Date   Anxiety    BPH (benign prostatic hyperplasia)    Cocaine use    07/15/21- last time was more than 10 years ago.   Complication of anesthesia    wakes up during surgery   COPD (chronic obstructive pulmonary disease) (HCC)    Coronary artery disease 2021   mild non obstructed   Diabetes mellitus without complication (HCC)    Enlarged heart    ESRD (end stage renal disease) (HCC)    TTHSAT   History of degenerative disc disease    Hyperlipidemia    Hypertension    NSTEMI (non-ST elevated myocardial infarction) (HCC)    Pneumonia    Sciatic nerve pain    Sleep apnea    Stroke (HCC)    no residual, x 3 last one was in 2020   Thoracic ascending aortic aneurysm (HCC)    4.2 cm 08/03/21 CTA chest    Past Surgical History:  Procedure Laterality Date   ANKLE ARTHROSCOPY Left 06/18/2023   Procedure: LEFT ANKLE ARTHROSCOPIC DEBRIDEMENT;  Surgeon: Elsa Lonni SAUNDERS, MD;  Location: [facility];  Service: Orthopedics;  Laterality: Left;   AV FISTULA PLACEMENT Left 07/17/2021   Procedure: CREATION  OF LEFT ARM RADIOCEPHALIC ARTERIOVENOUS (AV) FISTULA;  Surgeon: Serene Gaile ORN, MD;  Location: [facility] OR;  Service: Vascular;  Laterality: Left;   CARDIAC CATHETERIZATION     IR FLUORO GUIDE CV LINE RIGHT  05/20/2021   IR US  GUIDE VASC ACCESS RIGHT  05/20/2021   LIGATION OF COMPETING BRANCHES OF ARTERIOVENOUS FISTULA Left 09/13/2021   Procedure:  LIGATION AND ELEVATION OF COMPETING BRANCHES OF LEFT ARM ARTERIOVENOUS FISTULA;  Surgeon: Serene Gaile ORN, MD;  Location: [facility] OR;  Service: Vascular;  Laterality: Left;   ORIF ANKLE FRACTURE Left 06/18/2023   Procedure: OPEN TREATMENT LATERAL MALLEOLUS;  Surgeon: Elsa Lonni SAUNDERS, MD;  Location: [facility];  Service: Orthopedics;  Laterality: Left;   SYNDESMOSIS REPAIR Left 06/18/2023   Procedure: SYNDESMOSIS REPAIR;  Surgeon: Elsa Lonni SAUNDERS, MD;  Location: [facility];  Service: Orthopedics;  Laterality: Left;    Social History: Social  History   Tobacco Use   Smoking status: Every Day    Current packs/day: 0.50    Average packs/day: 0.5 packs/day for 40.0 years (20.0 ttl pk-yrs)    Types: Cigarettes   Smokeless tobacco: Never   Tobacco comments:    Started smoking again in April.  1/2 ppd.    Vaping Use   Vaping status: Never Used  Substance Use Topics   Alcohol use: Not Currently   Drug use: Not Currently    Comment: years ago per patient   Allergies  Allergen Reactions   Aspirin  Hives   Nitroglycerin  Other (See Comments)    Nitroglycerin  paste or IV will make blood pressure go up and cause headaches. The nitroglycerin  tablets do fine.    Family History  Problem Relation Age of Onset   Heart attack Mother    Hypertension Mother    Diabetes Mother    Heart disease Sister    Hypertension Sister    Hypertension Father    Emphysema Father      Prior to Admission medications   Medication Sig Start Date End Date Taking? Authorizing Provider  acetaminophen  (TYLENOL ) 500 MG tablet Take 500-1,000 mg by mouth every 6 (six) hours as needed (pain.).    [provider]  albuterol  (PROVENTIL ) (2.5 MG/3ML) 0.083% nebulizer solution Take 3 mLs (2.5 mg total) by nebulization every 6 (six) hours as needed for wheezing or shortness of breath. 01/14/23   Lorren Greig PARAS, NP  albuterol  (PROVENTIL ) (2.5 MG/3ML) 0.083% nebulizer solution Take 3 mLs (2.5 mg total) by nebulization every 6 (six) hours as needed for wheezing or shortness of breath. 05/25/23   Laurice Maude BROCKS, MD  amLODipine  (NORVASC ) 10 MG tablet Take 1 tablet (10 mg total) by mouth daily. 12/05/22   O'NealDarryle Ned, MD  aspirin  EC 81 MG tablet Take one tablet by mouth twice daily for 30 days after sugery for blood clot prevention. Start post op day 1. Swallow whole. 06/18/23   [place], Jesse J, PA-C  atorvastatin  (LIPITOR) 80 MG tablet Take 1 tablet (80 mg total) by mouth daily. Patient not taking: Reported on 06/15/2023 11/13/21 01/14/23  Akula,  Vijaya, MD  budesonide -formoterol  (SYMBICORT ) 160-4.5 MCG/ACT inhaler Inhale 2 puffs into the lungs 2 (two) times daily. 01/14/23   Lorren Greig PARAS, NP  calcium  acetate (PHOSLO ) 667 MG capsule Take 2 capsules (1,334 mg total) by mouth 3 (three) times daily with meals 06/25/21     Calcium  Carbonate Antacid (TUMS PO) Take 2 tablets by mouth 3 (three) times daily with meals. Patient not taking: Reported on 09/11/2023    [provider]  carvedilol  (COREG ) 25 MG tablet Take 1 tablet (25 mg total) by mouth 2 (two) times daily with a meal. 12/05/22   O'Neal, Darryle Ned, MD  doxazosin  (CARDURA ) 4 MG tablet Take 12 mg by mouth at bedtime. Patient not taking: Reported on 09/11/2023    [provider]  doxazosin  (CARDURA ) 8 MG  tablet Take 1 tablet (8 mg total) by mouth daily. 12/05/22   O'NealDarryle Ned, MD  doxycycline  (VIBRAMYCIN ) 100 MG capsule Take 1 capsule (100 mg total) by mouth 2 (two) times daily. 10/14/23   Yolande Lamar BROCKS, MD  hydrALAZINE  (APRESOLINE ) 100 MG tablet Take 1 tablet (100 mg total) by mouth 3 (three) times daily. 12/05/22   O'NealDarryle Ned, MD  Insulin  Glargine (BASAGLAR  KWIKPEN) 100 UNIT/ML Inject 20 Units into the skin daily. Patient taking differently: Inject 10 Units into the skin 2 (two) times daily. 06/19/21   Shamleffer, Ibtehal Jaralla, MD  insulin  lispro (HUMALOG ) 100 UNIT/ML injection Inject 0-4 Units into the skin 3 (three) times daily as needed for high blood sugar.    [provider]  Insulin  Pen Needle 31G X 6 MM MISC Use as directed in the morning, at noon, in the evening, and at bedtime. 06/19/21   Shamleffer, Ibtehal Jaralla, MD  liraglutide  (VICTOZA ) 18 MG/3ML SOPN Inject 1.8 mg into the skin daily. 06/19/21   Shamleffer, Donell Cardinal, MD  Misc. Devices MISC Please provide patient with insurance approved CPAP with the following settings:  autopap 15-20.  Large size [employer] Full Face Mask Forma mask and heated humidification. 12/25/19    Fleming, Zelda W, NP  nicotine  (NICODERM CQ  - DOSED IN MG/24 HOURS) 14 mg/24hr patch Place 1 patch (14 mg total) onto the skin daily as needed. Patient not taking: Reported on 09/11/2023 01/14/23   Lorren Greig PARAS, NP  nitroGLYCERIN  (NITROSTAT ) 0.4 MG SL tablet Place 1 tablet (0.4 mg total) under the tongue every 5 (five) minutes as needed for up to 30 doses for chest pain. Patient not taking: Reported on 09/11/2023 07/16/21   Cottie Donnice PARAS, MD  oxyCODONE  (ROXICODONE ) 5 MG immediate release tablet Take 1 tablet (5 mg total) by mouth every 4 (four) hours as needed (for post op pain). 10/14/23   Yolande Lamar BROCKS, MD  sertraline  (ZOLOFT ) 25 MG tablet Take 1 tablet (25 mg total) by mouth daily. Patient not taking: Reported on 09/11/2023 01/14/23   Lorren Greig PARAS, NP    Physical Exam: Vitals:   11/08/23 2259 11/08/23 2300 11/08/23 2315 11/08/23 2330  BP:  (!) 160/88 (!) 162/84 (!) 157/86  Pulse:  88 88 82  Resp:  15 11 (!) 22  Temp: 99.1 F (37.3 C)     TempSrc: Oral     SpO2:  100% 99% 92%   Constitutional: Resting in bed, NAD, calm, comfortable Eyes: EOMI, lids and conjunctivae normal ENMT: Mucous membranes are moist. Posterior pharynx clear of any exudate or lesions.Normal dentition.  Neck: normal, supple, no masses. Respiratory: Distant breath sounds otherwise clear to auscultation. Normal respiratory effort while on 2 L O2 Matthew Odom. No accessory muscle use.  Cardiovascular: Regular rate and rhythm, no murmurs / rubs / gallops. No extremity edema. 2+ pedal pulses.  LUE AVF with palpable thrill. Abdomen: no tenderness, no masses palpated. Musculoskeletal: no clubbing / cyanosis. No joint deformity upper and lower extremities. Good ROM, no contractures. Normal muscle tone.  Skin: no rashes, lesions, ulcers. No induration Neurologic: Sensation intact. Strength 5/5 in all 4.  Psychiatric: Normal judgment and insight. Alert and oriented x 3. Normal mood.   EKG: Personally reviewed. Sinus  tachycardia, rate 101, PVC, nonspecific IVCD, repolarization changes, T wave inversion lead V5-6.  T wave change new when compared to previous.  Assessment/Plan Principal Problem:   Acute on chronic heart failure with preserved ejection fraction (HFpEF) (HCC) Active Problems:  ESRD on dialysis (HCC)   Hypertension associated with diabetes (HCC)   Nonischemic cardiomyopathy (HCC)   Hyperlipidemia associated with type 2 diabetes mellitus (HCC)   Elevated troponin   Type 2 diabetes mellitus with chronic kidney disease on chronic dialysis, with long-term current use of insulin  (HCC)   Anemia of chronic disease   Matthew Odom is a 53 y.o. male with medical history significant for ESRD on TTS HD, NICM/HFrecEF, COPD, T2DM, HTN, HLD, anemia of chronic disease, BPH, OSA not using CPAP who is admitted with acute on chronic HFpEF/pulmonary edema.  Nephrology were consulted and have placed orders for emergent dialysis.  Assessment and Plan: Pulmonary edema/acute on chronic HFpEF: ESRD on TTS HD: Patient missed Saturday dialysis and presented with shortness of breath.  proBNP >13,000.  CXR consistent with pulmonary edema.  He has not been hypoxic but was placed on 2 L O2 [place] for comfort.  Nephrology consulted and have placed orders for emergent dialysis although unclear if this can be performed overnight. - Admit to progressive care unit - Hopefully can dialyze overnight - Can use BiPAP as needed - He is scheduled for AV shunt intervention with Dr. Magda on 7/7  NICM/elevated troponin: Troponin T elevated 215 > 217.  Patient denies chest pain.  Nonspecific T wave changes seen on EKG otherwise similar to previous.  Suspect this is demand ischemia in setting of pulmonary edema/acute on chronic CHF and ESRD.  Will keep on telemetry.  COPD: No wheezing or sign of COPD exacerbation on admission.  Continue Breo Ellipta  and albuterol  as needed.  Hypertension: Initially hypertensive to 206/109 on  arrival to ED.  BP improved with IV hydralazine . - Resume home Coreg , amlodipine , hydralazine , doxazosin  - IV hydralazine  available if needed for SBP >180  Type 2 diabetes: Place on SSI.  Anemia of chronic disease: Hemoglobin stable.  Continue monitor.  Hyperlipidemia: Unclear if patient still taking statin.  BPH: Continue doxazosin .   DVT prophylaxis: heparin  injection 5,000 Units Start: 11/09/23 0600 Code Status: Full code Family Communication: Spouse at bedside Disposition Plan: From home, dispo pending clinical progress Consults called: Nephrology Severity of Illness: The appropriate patient status for this patient is OBSERVATION. Observation status is judged to be reasonable and necessary in order to provide the required intensity of service to ensure the patient's safety. The patient's presenting symptoms, physical exam findings, and initial radiographic and laboratory data in the context of their medical condition is felt to place them at decreased risk for further clinical deterioration. Furthermore, it is anticipated that the patient will be medically stable for discharge from the hospital within 2 midnights of admission.   Jorie Blanch MD [facility]  If 7PM-7AM, please contact night-coverage [email or web address]  11/09/2023, 12:10 AM

## 2023-11-08 NOTE — ED Triage Notes (Signed)
 Patient coming for shortness of breath. Patient is an end stage dialysis patient, Tuesday, Thursday, and Saturday. Missed Saturday and has new onset shortness of breath. Has not experienced previously with other missed dialysis appointments. Imaging shows pulmonary edema and cardiomegaly. BNP was elevated along with BP. Left arm is restricted.  [facility] VS 160/105 BP 98% 2L for comfort

## 2023-11-08 NOTE — ED Provider Notes (Signed)
 [place] EMERGENCY DEPARTMENT AT [facility] Provider Note   CSN: [id number] Arrival date & time: 11/08/23  8071     Patient presents with: Shortness of Breath   Matthew Odom is a 53 y.o. male.    Shortness of Breath   53 year old male presents to the emergency department complaints of shortness of breath.  States that shortness of breath began this morning.  States he did have albuterol  inhaler which helped some.  Denies any fevers, chest pain, cough, abdominal pain, nausea, vomiting.  Patient does report history of dialysis with diagnosed ESRD receiving dialysis Tuesday Thursday Saturday with most recent session on Thursday; unable to get dialysis this weekend due to holiday weekend.  States he has upcoming pulmonary on Tuesday.  States he does not feel like he is retaining fluid as his weight is near normal.  There is a feels like he is having a COPD flareup.  States his shortness of breath is worsened with lying flat.  Past medical history significant for CAD, COPD, diabetes mellitus, ESRD, NSTEMI, hypertension, hyperlipidemia, thoracic ascending arctic aneurysm, diabetes mellitus type 2, CHF  Prior to Admission medications   Medication Sig Start Date End Date Taking? Authorizing Provider  acetaminophen  (TYLENOL ) 500 MG tablet Take 500-1,000 mg by mouth every 6 (six) hours as needed (pain.).    [provider]  albuterol  (PROVENTIL ) (2.5 MG/3ML) 0.083% nebulizer solution Take 3 mLs (2.5 mg total) by nebulization every 6 (six) hours as needed for wheezing or shortness of breath. 01/14/23   Lorren Greig PARAS, NP  albuterol  (PROVENTIL ) (2.5 MG/3ML) 0.083% nebulizer solution Take 3 mLs (2.5 mg total) by nebulization every 6 (six) hours as needed for wheezing or shortness of breath. 05/25/23   Laurice Maude BROCKS, MD  amLODipine  (NORVASC ) 10 MG tablet Take 1 tablet (10 mg total) by mouth daily. 12/05/22   O'NealDarryle Ned, MD  aspirin  EC 81 MG tablet Take one tablet by mouth  twice daily for 30 days after sugery for blood clot prevention. Start post op day 1. Swallow whole. 06/18/23   [place], Jesse J, PA-C  atorvastatin  (LIPITOR) 80 MG tablet Take 1 tablet (80 mg total) by mouth daily. Patient not taking: Reported on 06/15/2023 11/13/21 01/14/23  Akula, Vijaya, MD  budesonide -formoterol  (SYMBICORT ) 160-4.5 MCG/ACT inhaler Inhale 2 puffs into the lungs 2 (two) times daily. 01/14/23   Lorren Greig PARAS, NP  calcium  acetate (PHOSLO ) 667 MG capsule Take 2 capsules (1,334 mg total) by mouth 3 (three) times daily with meals 06/25/21     Calcium  Carbonate Antacid (TUMS PO) Take 2 tablets by mouth 3 (three) times daily with meals. Patient not taking: Reported on 09/11/2023    [provider]  carvedilol  (COREG ) 25 MG tablet Take 1 tablet (25 mg total) by mouth 2 (two) times daily with a meal. 12/05/22   O'Neal, Darryle Ned, MD  doxazosin  (CARDURA ) 4 MG tablet Take 12 mg by mouth at bedtime. Patient not taking: Reported on 09/11/2023    [provider]  doxazosin  (CARDURA ) 8 MG tablet Take 1 tablet (8 mg total) by mouth daily. 12/05/22   O'NealDarryle Ned, MD  doxycycline  (VIBRAMYCIN ) 100 MG capsule Take 1 capsule (100 mg total) by mouth 2 (two) times daily. 10/14/23   Yolande Lamar BROCKS, MD  hydrALAZINE  (APRESOLINE ) 100 MG tablet Take 1 tablet (100 mg total) by mouth 3 (three) times daily. 12/05/22   O'NealDarryle Ned, MD  Insulin  Glargine (BASAGLAR  KWIKPEN) 100 UNIT/ML Inject 20 Units  into the skin daily. Patient taking differently: Inject 10 Units into the skin 2 (two) times daily. 06/19/21   Shamleffer, Ibtehal Jaralla, MD  insulin  lispro (HUMALOG ) 100 UNIT/ML injection Inject 0-4 Units into the skin 3 (three) times daily as needed for high blood sugar.    [provider]  Insulin  Pen Needle 31G X 6 MM MISC Use as directed in the morning, at noon, in the evening, and at bedtime. 06/19/21   Shamleffer, Ibtehal Jaralla, MD  liraglutide  (VICTOZA ) 18 MG/3ML SOPN  Inject 1.8 mg into the skin daily. 06/19/21   Shamleffer, Donell Cardinal, MD  Misc. Devices MISC Please provide patient with insurance approved CPAP with the following settings:  autopap 15-20.  Large size [employer] Full Face Mask Forma mask and heated humidification. 12/25/19   Fleming, Zelda W, NP  nicotine  (NICODERM CQ  - DOSED IN MG/24 HOURS) 14 mg/24hr patch Place 1 patch (14 mg total) onto the skin daily as needed. Patient not taking: Reported on 09/11/2023 01/14/23   Lorren Greig PARAS, NP  nitroGLYCERIN  (NITROSTAT ) 0.4 MG SL tablet Place 1 tablet (0.4 mg total) under the tongue every 5 (five) minutes as needed for up to 30 doses for chest pain. Patient not taking: Reported on 09/11/2023 07/16/21   Cottie Donnice PARAS, MD  oxyCODONE  (ROXICODONE ) 5 MG immediate release tablet Take 1 tablet (5 mg total) by mouth every 4 (four) hours as needed (for post op pain). 10/14/23   Yolande Lamar BROCKS, MD  sertraline  (ZOLOFT ) 25 MG tablet Take 1 tablet (25 mg total) by mouth daily. Patient not taking: Reported on 09/11/2023 01/14/23   Lorren Greig PARAS, NP    Allergies: Aspirin  and Nitroglycerin     Review of Systems  Respiratory:  Positive for shortness of breath.   All other systems reviewed and are negative.   Updated Vital Signs BP (!) 206/109   Pulse 95   Resp 14   SpO2 99%   Physical Exam Vitals and nursing note reviewed.  Constitutional:      General: He is not in acute distress.    Appearance: He is well-developed.  HENT:     Head: Normocephalic and atraumatic.  Eyes:     Conjunctiva/sclera: Conjunctivae normal.  Cardiovascular:     Rate and Rhythm: Regular rhythm. Tachycardia present.     Heart sounds: No murmur heard. Pulmonary:     Effort: Pulmonary effort is normal. Tachypnea present.     Breath sounds: Decreased breath sounds present.  Abdominal:     Palpations: Abdomen is soft.     Tenderness: There is no abdominal tenderness.  Musculoskeletal:        General: No swelling.      Cervical back: Neck supple.     Comments: 1+ pitting edema bilateral lower extremities.  Skin:    General: Skin is warm and dry.     Capillary Refill: Capillary refill takes less than 2 seconds.  Neurological:     Mental Status: He is alert.  Psychiatric:        Mood and Affect: Mood normal.     (all labs ordered are listed, but only abnormal results are displayed) Labs Reviewed  CBC WITH DIFFERENTIAL/PLATELET - Abnormal; Notable for the following components:      Result Value   RBC 3.60 (*)    Hemoglobin 9.8 (*)    HCT 31.2 (*)    All other components within normal limits  I-STAT VENOUS BLOOD GAS, ED - Abnormal; Notable for the following components:  pCO2, Ven 39.7 (*)    Acid-base deficit 4.0 (*)    Calcium , Ion 0.98 (*)    HCT 31.0 (*)    Hemoglobin 10.5 (*)    All other components within normal limits  RESP PANEL BY RT-PCR (RSV, FLU A&B, COVID)  RVPGX2  COMPREHENSIVE METABOLIC PANEL WITH GFR  PRO BRAIN NATRIURETIC PEPTIDE  TROPONIN T, HIGH SENSITIVITY    EKG: None  Radiology: No results found.   Procedures   Medications Ordered in the ED  ipratropium-albuterol  (DUONEB) 0.5-2.5 (3) MG/3ML nebulizer solution 3 mL (3 mLs Nebulization Given 11/08/23 1959)  hydrALAZINE  (APRESOLINE ) injection 10 mg (10 mg Intravenous Given 11/08/23 1956)    Clinical Course as of 11/08/23 2226  Sun Nov 08, 2023  2045 [facility] attending physician Dr. Zackowski who independently evaluated the patient was agreement treatment plan going forward. [CR]  2050 Consulted Dr. Tobie with nephrology.  Recommended ED to ED transfer for dialysis either tonight or tomorrow morning. [CR]    Clinical Course User Index [CR] Silver Wonda LABOR, PA                                 Medical Decision Making Amount and/or Complexity of Data Reviewed Labs: ordered. Radiology: ordered.  Risk Prescription drug management.   This patient presents to the ED for concern of shortness of breath, this  involves an extensive number of treatment options, and is a complaint that carries with it a high risk of complications and morbidity.  The differential diagnosis includes ACS, PE, pneumothorax, COPD/asthma, CHF, volume overload, pneumonia, pneumothorax, other   Co morbidities that complicate the patient evaluation  See HPI   Additional history obtained:  Additional history obtained from EMR External records from outside source obtained and reviewed including hospital records   Lab Tests:  I Ordered, and personally interpreted labs.  The pertinent results include: BNP 13,877, initial troponin of 215 with repeat 217.  No leukocytosis.  Anemia hemoglobin of 9.8 which is near baseline.  Moderate beats of bicarb of 20 otherwise, nitrites within normal limits.  Renal function creatinine of 13.5, BUN of 84 with a GFR of 4.  Viral testing negative.  ABG normal pH, pCO2 low, pO2 normal, bicarb normal.   Imaging Studies ordered:  I ordered imaging studies including chest x-ray I independently visualized and interpreted imaging which showed pulmonary edema with cardiomegaly I agree with the radiologist interpretation   Cardiac Monitoring: / EKG:  The patient was maintained on a cardiac monitor.  I personally viewed and interpreted the cardiac monitored which showed an underlying rhythm of: sinus rhythm with vpc. Nonspecific intraventricular conduction delay. St elevation anterior resides.  No obvious acute ischemic change from prior EKG performed.   Consultations Obtained:  See ED course  Problem List / ED Course / Critical interventions / Medication management  Shortness of breath, volume overload I ordered medication including hydralazine , DuoNeb, for carvedilol    Reevaluation of the patient after these medicines showed that the patient improved I have reviewed the patients home medicines and have made adjustments as needed   Social Determinants of Health:  Chronic cigarette  use.   Test / Admission - Considered:  Shortness of breath, volume overload Vitals signs significant for initial tachycardia, tachypnea as well as hypertension.  Blood pressure improved with medications administered while in the emergency department.  Patient's O2 sats remained normal.. Otherwise within normal range and stable throughout visit. Laboratory/imaging  studies significant for: See above 53 year old male presents to the emergency department complaints of shortness of breath.  States that shortness of breath began this morning.  States he did have albuterol  inhaler which helped some.  Denies any fevers, chest pain, cough, abdominal pain, nausea, vomiting.  Patient does report history of dialysis with diagnosed ESRD receiving dialysis Tuesday Thursday Saturday with most recent session on Thursday; unable to get dialysis this weekend due to holiday weekend.  States he has upcoming pulmonary on Tuesday.  States he does not feel like he is retaining fluid as his weight is near normal.  There is a feels like he is having a COPD flareup.  States his shortness of breath is worsened with lying flat. On exam initially, decreased breath sounds bilateral lung fields with 1-2+ pitting edema lower extremities.  No abdominal tenderness.  Workup today concerning for volume overload with BNP of 13,000, chest x-ray pulmonary edema with physical exam findings.  Given patient's history of ESRD with missed dialysis yesterday, consulted nephrology who recommended ED to ED transfer for dialysis.  Patient did have initial elevated troponin of 215 with delta of 217 likely secondary to demand ischemia; patient without any current chest pain.  Will begin transfer process to [facility].  Plan is for nephrology to evaluate and dialyze.  Patient stable upon transfer.     Final diagnoses:  None    ED Discharge Orders     None          Silver Wonda LABOR, [place] 11/08/23 2253    Geraldene Hamilton, MD 11/11/23 1505

## 2023-11-09 ENCOUNTER — Emergency Department (HOSPITAL_COMMUNITY)

## 2023-11-09 ENCOUNTER — Emergency Department (HOSPITAL_COMMUNITY)
Admission: EM | Admit: 2023-11-09 | Discharge: 2023-11-09 | Discharge status: Against Medical Advice | Attending: Emergency Medicine | Admitting: Emergency Medicine

## 2023-11-09 ENCOUNTER — Ambulatory Visit (HOSPITAL_COMMUNITY): Admission: RE | Admit: 2023-11-09 | Source: Home / Self Care | Admitting: Vascular Surgery

## 2023-11-09 ENCOUNTER — Other Ambulatory Visit: Payer: Self-pay

## 2023-11-09 ENCOUNTER — Encounter (HOSPITAL_COMMUNITY): Payer: Self-pay

## 2023-11-09 DIAGNOSIS — E8779 Other fluid overload: Secondary | ICD-10-CM | POA: Diagnosis not present

## 2023-11-09 DIAGNOSIS — F419 Anxiety disorder, unspecified: Secondary | ICD-10-CM | POA: Diagnosis not present

## 2023-11-09 DIAGNOSIS — D631 Anemia in chronic kidney disease: Secondary | ICD-10-CM | POA: Diagnosis not present

## 2023-11-09 DIAGNOSIS — F1490 Cocaine use, unspecified, uncomplicated: Secondary | ICD-10-CM | POA: Insufficient documentation

## 2023-11-09 DIAGNOSIS — R002 Palpitations: Secondary | ICD-10-CM | POA: Insufficient documentation

## 2023-11-09 DIAGNOSIS — I2510 Atherosclerotic heart disease of native coronary artery without angina pectoris: Secondary | ICD-10-CM | POA: Insufficient documentation

## 2023-11-09 DIAGNOSIS — I959 Hypotension, unspecified: Secondary | ICD-10-CM | POA: Diagnosis not present

## 2023-11-09 DIAGNOSIS — N186 End stage renal disease: Secondary | ICD-10-CM | POA: Insufficient documentation

## 2023-11-09 DIAGNOSIS — E1122 Type 2 diabetes mellitus with diabetic chronic kidney disease: Secondary | ICD-10-CM | POA: Insufficient documentation

## 2023-11-09 DIAGNOSIS — J449 Chronic obstructive pulmonary disease, unspecified: Secondary | ICD-10-CM | POA: Insufficient documentation

## 2023-11-09 DIAGNOSIS — I120 Hypertensive chronic kidney disease with stage 5 chronic kidney disease or end stage renal disease: Secondary | ICD-10-CM | POA: Insufficient documentation

## 2023-11-09 DIAGNOSIS — Z7982 Long term (current) use of aspirin: Secondary | ICD-10-CM | POA: Insufficient documentation

## 2023-11-09 DIAGNOSIS — R0689 Other abnormalities of breathing: Secondary | ICD-10-CM | POA: Diagnosis not present

## 2023-11-09 DIAGNOSIS — Z794 Long term (current) use of insulin: Secondary | ICD-10-CM | POA: Insufficient documentation

## 2023-11-09 DIAGNOSIS — I517 Cardiomegaly: Secondary | ICD-10-CM | POA: Diagnosis not present

## 2023-11-09 DIAGNOSIS — R079 Chest pain, unspecified: Secondary | ICD-10-CM | POA: Diagnosis not present

## 2023-11-09 DIAGNOSIS — I5033 Acute on chronic diastolic (congestive) heart failure: Secondary | ICD-10-CM | POA: Diagnosis not present

## 2023-11-09 DIAGNOSIS — J810 Acute pulmonary edema: Secondary | ICD-10-CM | POA: Diagnosis not present

## 2023-11-09 DIAGNOSIS — F17210 Nicotine dependence, cigarettes, uncomplicated: Secondary | ICD-10-CM | POA: Insufficient documentation

## 2023-11-09 DIAGNOSIS — R0602 Shortness of breath: Secondary | ICD-10-CM | POA: Diagnosis not present

## 2023-11-09 DIAGNOSIS — Z992 Dependence on renal dialysis: Secondary | ICD-10-CM | POA: Insufficient documentation

## 2023-11-09 DIAGNOSIS — R0902 Hypoxemia: Secondary | ICD-10-CM | POA: Diagnosis not present

## 2023-11-09 DIAGNOSIS — R000 Tachycardia, unspecified: Secondary | ICD-10-CM | POA: Diagnosis not present

## 2023-11-09 LAB — BASIC METABOLIC PANEL WITH GFR
Anion gap: 16 — ABNORMAL HIGH (ref 5–15)
Anion gap: 15 (ref 5–15)
BUN: 90 mg/dL — ABNORMAL HIGH (ref 6–20)
BUN: 68 mg/dL — ABNORMAL HIGH (ref 6–20)
CO2: 20 mmol/L — ABNORMAL LOW (ref 22–32)
CO2: 25 mmol/L (ref 22–32)
Calcium: 8.1 mg/dL — ABNORMAL LOW (ref 8.9–10.3)
Calcium: 8.3 mg/dL — ABNORMAL LOW (ref 8.9–10.3)
Chloride: 102 mmol/L (ref 98–111)
Chloride: 98 mmol/L (ref 98–111)
Creatinine, Ser: 13.47 mg/dL — ABNORMAL HIGH (ref 0.61–1.24)
Creatinine, Ser: 9.92 mg/dL — ABNORMAL HIGH (ref 0.61–1.24)
GFR, Estimated: 4 mL/min — ABNORMAL LOW (ref >60)
GFR, Estimated: 6 mL/min — ABNORMAL LOW (ref >60)
Glucose, Bld: 152 mg/dL — ABNORMAL HIGH (ref 70–99)
Glucose, Bld: 236 mg/dL — ABNORMAL HIGH (ref 70–99)
Potassium: 3.8 mmol/L (ref 3.5–5.1)
Potassium: 3.4 mmol/L — ABNORMAL LOW (ref 3.5–5.1)
Sodium: 138 mmol/L (ref 135–145)
Sodium: 138 mmol/L (ref 135–145)

## 2023-11-09 LAB — CBC
HCT: 31 % — ABNORMAL LOW (ref 39.0–52.0)
HCT: 32.4 % — ABNORMAL LOW (ref 39.0–52.0)
Hemoglobin: 9.7 g/dL — ABNORMAL LOW (ref 13.0–17.0)
Hemoglobin: 10.3 g/dL — ABNORMAL LOW (ref 13.0–17.0)
MCH: 27 pg (ref 26.0–34.0)
MCH: 27.5 pg (ref 26.0–34.0)
MCHC: 31.3 g/dL (ref 30.0–36.0)
MCHC: 31.8 g/dL (ref 30.0–36.0)
MCV: 86.4 fL (ref 80.0–100.0)
MCV: 86.6 fL (ref 80.0–100.0)
Platelets: 237 K/uL (ref 150–400)
Platelets: 220 K/uL (ref 150–400)
RBC: 3.59 MIL/uL — ABNORMAL LOW (ref 4.22–5.81)
RBC: 3.74 MIL/uL — ABNORMAL LOW (ref 4.22–5.81)
RDW: 14.6 % (ref 11.5–15.5)
RDW: 14.7 % (ref 11.5–15.5)
WBC: 9.8 K/uL (ref 4.0–10.5)
WBC: 7.6 K/uL (ref 4.0–10.5)
nRBC: 0 % (ref 0.0–0.2)
nRBC: 0 % (ref 0.0–0.2)

## 2023-11-09 LAB — RAPID URINE DRUG SCREEN, HOSP PERFORMED
Amphetamines: NOT DETECTED
Barbiturates: NOT DETECTED
Benzodiazepines: NOT DETECTED
Cocaine: POSITIVE — AB
Opiates: NOT DETECTED
Tetrahydrocannabinol: NOT DETECTED

## 2023-11-09 LAB — HEPATITIS B SURFACE ANTIGEN: Hepatitis B Surface Ag: NONREACTIVE

## 2023-11-09 LAB — T4, FREE: Free T4: 0.75 ng/dL (ref 0.61–1.12)

## 2023-11-09 LAB — TROPONIN I (HIGH SENSITIVITY)
Troponin I (High Sensitivity): 140 ng/L (ref <18)
Troponin I (High Sensitivity): 143 ng/L (ref <18)

## 2023-11-09 LAB — GLUCOSE, CAPILLARY
Glucose-Capillary: 148 mg/dL — ABNORMAL HIGH (ref 70–99)
Glucose-Capillary: 134 mg/dL — ABNORMAL HIGH (ref 70–99)
Glucose-Capillary: 115 mg/dL — ABNORMAL HIGH (ref 70–99)

## 2023-11-09 LAB — BRAIN NATRIURETIC PEPTIDE: B Natriuretic Peptide: 664.8 pg/mL — ABNORMAL HIGH (ref 0.0–100.0)

## 2023-11-09 LAB — HIV ANTIBODY (ROUTINE TESTING W REFLEX): HIV Screen 4th Generation wRfx: NONREACTIVE

## 2023-11-09 LAB — TSH: TSH: 1.621 u[IU]/mL (ref 0.350–4.500)

## 2023-11-09 LAB — MRSA NEXT GEN BY PCR, NASAL: MRSA by PCR Next Gen: NOT DETECTED

## 2023-11-09 SURGERY — A/V SHUNT INTERVENTION
Anesthesia: LOCAL | Site: Arm Lower | Laterality: Left

## 2023-11-09 MED ORDER — MELATONIN 3 MG PO TABS
3.0000 mg | ORAL_TABLET | Freq: Every evening | ORAL | Status: DC | PRN
  Administered 2023-11-09: 3 mg via ORAL
  Filled 2023-11-09: qty 1

## 2023-11-09 MED ORDER — IPRATROPIUM-ALBUTEROL 0.5-2.5 (3) MG/3ML IN SOLN: 3.0000 mL | RESPIRATORY_TRACT | Status: DC | PRN

## 2023-11-09 MED ORDER — GUAIFENESIN 100 MG/5ML PO LIQD: 5.0000 mL | ORAL | Status: DC | PRN

## 2023-11-09 MED ORDER — DOXAZOSIN MESYLATE 8 MG PO TABS
8.0000 mg | ORAL_TABLET | Freq: Every day | ORAL | Status: DC
  Administered 2023-11-09: 8 mg via ORAL
  Filled 2023-11-09: qty 1

## 2023-11-09 MED ORDER — INSULIN ASPART 100 UNIT/ML IJ SOLN: 0.0000 [IU] | Freq: Three times a day (TID) | INTRAMUSCULAR | Status: DC

## 2023-11-09 MED ORDER — HYDRALAZINE HCL 50 MG PO TABS: 100.0000 mg | ORAL_TABLET | Freq: Three times a day (TID) | ORAL | Status: DC

## 2023-11-09 MED ORDER — FLUTICASONE FUROATE-VILANTEROL 200-25 MCG/ACT IN AEPB
1.0000 | INHALATION_SPRAY | Freq: Every day | RESPIRATORY_TRACT | Status: DC
  Filled 2023-11-09: qty 28

## 2023-11-09 MED ORDER — AMLODIPINE BESYLATE 10 MG PO TABS
10.0000 mg | ORAL_TABLET | Freq: Every day | ORAL | Status: DC
  Administered 2023-11-09: 10 mg via ORAL
  Filled 2023-11-09: qty 1

## 2023-11-09 MED ORDER — METOPROLOL TARTRATE 5 MG/5ML IV SOLN: 5.0000 mg | INTRAVENOUS | Status: DC | PRN

## 2023-11-09 MED ORDER — CARVEDILOL 25 MG PO TABS: 25.0000 mg | ORAL_TABLET | Freq: Two times a day (BID) | ORAL | Status: DC

## 2023-11-09 MED ORDER — ALBUTEROL SULFATE (2.5 MG/3ML) 0.083% IN NEBU: 2.5000 mg | INHALATION_SOLUTION | Freq: Four times a day (QID) | RESPIRATORY_TRACT | Status: DC | PRN

## 2023-11-09 MED ORDER — SENNOSIDES-DOCUSATE SODIUM 8.6-50 MG PO TABS: 1.0000 | ORAL_TABLET | Freq: Every evening | ORAL | Status: DC | PRN

## 2023-11-09 MED ORDER — HYDRALAZINE HCL 20 MG/ML IJ SOLN
10.0000 mg | INTRAMUSCULAR | Status: DC | PRN
  Administered 2023-11-09: 10 mg via INTRAVENOUS
  Filled 2023-11-09: qty 1

## 2023-11-09 NOTE — ED Notes (Signed)
 This nurse called [facility] to see if they are ready for the patient, [facility] accepting nurse stated they are ready. This nurse called [facility] to assist with transporting patient.

## 2023-11-09 NOTE — ED Triage Notes (Signed)
 PT arrives via EMS from home. PT reports chest pain that started about 30 mins ago. Pt states he missed dialysis on Saturday. Pt states he had dialysis here today priort to being discharged. EMS reports that the patient was initially hypoxic in the 80s. Pt is currently 100% on room air. Pt reports associated sob with the chest pain. Pt arrives AxOx4.   EMS administered 1 nitroglycerin  with improvement.

## 2023-11-09 NOTE — ED Notes (Signed)
 ED TO INPATIENT HANDOFF REPORT  ED Nurse Name and Phone #: Janeth RN [phone]  S Name/Age/Gender Matthew Odom 53 y.o. male Room/Bed: 035C/035C  Code Status   Code Status: Full Code  Home/SNF/Other Home Patient oriented to: self, place, time, and situation Is this baseline? Yes   Triage Complete: Triage complete  Chief Complaint Acute on chronic heart failure with preserved ejection fraction (HFpEF) (HCC) [I50.33]  Triage Note Pt c/o SHOB onset this AM, wake up & take breathing treatment, then it happens all over again. Advises 3 breathing tx today  Dialysis T,Th,Sat, advises last session Thursday, unable to obtain this weekend r/t holiday weekend.   Patient coming for shortness of breath. Patient is an end stage dialysis patient, Tuesday, Thursday, and Saturday. Missed Saturday and has new onset shortness of breath. Has not experienced previously with other missed dialysis appointments. Imaging shows pulmonary edema and cardiomegaly. BNP was elevated along with BP. Left arm is restricted.  [facility] VS 160/105 BP 98% 2L for comfort   Allergies Allergies  Allergen Reactions   Aspirin  Hives   Nitroglycerin  Other (See Comments)    Nitroglycerin  paste or IV will make blood pressure go up and cause headaches. The nitroglycerin  tablets do fine.    Level of Care/Admitting Diagnosis ED Disposition     ED Disposition  Admit   Condition  --   Comment  Hospital Area: [facility] [[id number]]  Level of Care: Progressive [102]  Admit to Progressive based on following criteria: NEPHROLOGY stable condition requiring close monitoring for AKI, requiring Hemodialysis or Peritoneal Dialysis either from expected electrolyte imbalance, acidosis, or fluid overload that can be managed by NIPPV or high flow oxygen.  Admit to Progressive based on following criteria: CARDIOVASCULAR & THORACIC of moderate stability with acute coronary syndrome symptoms/low risk  myocardial infarction/hypertensive urgency/arrhythmias/heart failure potentially compromising stability and stable post cardiovascular intervention patients.  May place patient in observation at [facility] or [facility] if equivalent level of care is available:: No  Covid Evaluation: Confirmed COVID Negative  Diagnosis: Acute on chronic heart failure with preserved ejection fraction (HFpEF) [facility]) [[id number]]  Admitting Physician: TOBIE JORIE SAUNDERS [[id number]]  Attending Physician: TOBIE JORIE SAUNDERS [[id number]]          B Medical/Surgery History Past Medical History:  Diagnosis Date   Anxiety    BPH (benign prostatic hyperplasia)    Cocaine use    07/15/21- last time was more than 10 years ago.   Complication of anesthesia    wakes up during surgery   COPD (chronic obstructive pulmonary disease) (HCC)    Coronary artery disease 2021   mild non obstructed   Diabetes mellitus without complication (HCC)    Enlarged heart    ESRD (end stage renal disease) (HCC)    TTHSAT   History of degenerative disc disease    Hyperlipidemia    Hypertension    NSTEMI (non-ST elevated myocardial infarction) (HCC)    Pneumonia    Sciatic nerve pain    Sleep apnea    Stroke (HCC)    no residual, x 3 last one was in 2020   Thoracic ascending aortic aneurysm (HCC)    4.2 cm 08/03/21 CTA chest   Past Surgical History:  Procedure Laterality Date   ANKLE ARTHROSCOPY Left 06/18/2023   Procedure: LEFT ANKLE ARTHROSCOPIC DEBRIDEMENT;  Surgeon: Elsa Lonni SAUNDERS, MD;  Location: [facility];  Service: Orthopedics;  Laterality: Left;   AV FISTULA PLACEMENT Left 07/17/2021   Procedure:  CREATION  OF LEFT ARM RADIOCEPHALIC ARTERIOVENOUS (AV) FISTULA;  Surgeon: Serene Gaile ORN, MD;  Location: [facility] OR;  Service: Vascular;  Laterality: Left;   CARDIAC CATHETERIZATION     IR FLUORO GUIDE CV LINE RIGHT  05/20/2021   IR US  GUIDE VASC ACCESS RIGHT  05/20/2021   LIGATION OF COMPETING BRANCHES OF ARTERIOVENOUS FISTULA Left  09/13/2021   Procedure: LIGATION AND ELEVATION OF COMPETING BRANCHES OF LEFT ARM ARTERIOVENOUS FISTULA;  Surgeon: Serene Gaile ORN, MD;  Location: [facility] OR;  Service: Vascular;  Laterality: Left;   ORIF ANKLE FRACTURE Left 06/18/2023   Procedure: OPEN TREATMENT LATERAL MALLEOLUS;  Surgeon: Elsa Lonni SAUNDERS, MD;  Location: [facility];  Service: Orthopedics;  Laterality: Left;   SYNDESMOSIS REPAIR Left 06/18/2023   Procedure: SYNDESMOSIS REPAIR;  Surgeon: Elsa Lonni SAUNDERS, MD;  Location: [facility];  Service: Orthopedics;  Laterality: Left;     A IV Location/Drains/Wounds Patient Lines/Drains/Airways Status     Active Line/Drains/Airways     Name Placement date Placement time Site Days   Peripheral IV 11/08/23 18 G Right Antecubital 11/08/23  1942  Antecubital  1   Fistula / Graft Left Forearm Arteriovenous fistula 07/17/21  1147  Forearm  845   Fistula / Graft Left Forearm --  --  Forearm  --   Hemodialysis Catheter Right Internal jugular Double lumen Permanent (Tunneled) 05/20/21  1304  Internal jugular  903   Hemodialysis Catheter Right --  --  --  --            Intake/Output Last 24 hours  Intake/Output Summary (Last 24 hours) at 11/09/2023 0023 Last data filed at 11/09/2023 0010 Gross per 24 hour  Intake 3 ml  Output --  Net 3 ml    Labs/Imaging Results for orders placed or performed during the hospital encounter of 11/08/23 (from the past 48 hours)  Comprehensive metabolic panel     Status: Abnormal   Collection Time: 11/08/23  7:47 PM  Result Value Ref Range   Sodium 140 135 - 145 mmol/L   Potassium 4.0 3.5 - 5.1 mmol/L   Chloride 100 98 - 111 mmol/L   CO2 20 (L) 22 - 32 mmol/L   Glucose, Bld 163 (H) 70 - 99 mg/dL    Comment: Glucose reference range applies only to samples taken after fasting for at least 8 hours.   BUN 84 (H) 6 - 20 mg/dL   Creatinine, Ser 86.49 (H) 0.61 - 1.24 mg/dL   Calcium  8.5 (L) 8.9 - 10.3 mg/dL   Total Protein 7.7 6.5 - 8.1 g/dL   Albumin 4.2  3.5 - 5.0 g/dL   AST 15 15 - 41 U/L   ALT 11 0 - 44 U/L   Alkaline Phosphatase 69 38 - 126 U/L   Total Bilirubin 0.2 0.0 - 1.2 mg/dL   GFR, Estimated 4 (L) >60 mL/min    Comment: (NOTE) Calculated using the CKD-EPI Creatinine Equation (2021)    Anion gap 20 (H) 5 - 15    Comment: Performed at [facility], [address], [place], [place] [address]  Troponin T, High Sensitivity     Status: Abnormal   Collection Time: 11/08/23  7:47 PM  Result Value Ref Range   Troponin T High Sensitivity 215 (HH) <19 ng/L    Comment: Critical Value, Read Back and verified with SHANNIN MAYNARD, RN @2028  AD (NOTE) Biotin concentrations > 1000 ng/mL falsely decrease TnT results.  Serial cardiac troponin measurements are suggested.  Refer  to the Links section for chest pain algorithms and additional  guidance. Performed at [facility], [address], [place], [place] [address]   Pro Brain natriuretic peptide     Status: Abnormal   Collection Time: 11/08/23  7:47 PM  Result Value Ref Range   Pro Brain Natriuretic Peptide 13,877.0 (H) <300.0 pg/mL    Comment: (NOTE) Age Group        Cut-Points    Interpretation  < 50 years     450 pg/mL       NT-proBNP > 450 pg/mL indicates                                ADHF is likely              50 to 75 years  900 pg/mL      NT-proBNP > 900 pg/mL indicates          ADHF is likely  > 75 years      1800 pg/mL     NT-proBNP > 1800 pg/mL indicates          ADHF is likely                           All ages    Results between       Indeterminate. Further clinical             300 and the cut-   information is needed to determine            point for age group   if ADHF is present.                                                             Elecsys proBNP II/ Elecsys proBNP II STAT           Cut-Point                       Interpretation  300 pg/mL                    NT-proBNP <300pg/mL indicates                              ADHF is not likely  Performed at [facility], [address], [place], [place] [address]   CBC with Differential     Status: Abnormal   Collection Time: 11/08/23  7:47 PM  Result Value Ref Range   WBC 10.1 4.0 - 10.5 K/uL   RBC 3.60 (L) 4.22 - 5.81 MIL/uL   Hemoglobin 9.8 (L) 13.0 - 17.0 g/dL   HCT 68.7 (L) 60.9 - 47.9 %   MCV 86.7 80.0 - 100.0 fL   MCH 27.2 26.0 - 34.0 pg   MCHC 31.4 30.0 - 36.0 g/dL   RDW 85.1 88.4 - 84.4 %   Platelets 238 150 - 400 K/uL   nRBC 0.0 0.0 - 0.2 %   Neutrophils Relative % 68 %   Neutro Abs 6.8 1.7 - 7.7 K/uL   Lymphocytes Relative 22 %   Lymphs Abs 2.3 0.7 -  4.0 K/uL   Monocytes Relative 8 %   Monocytes Absolute 0.8 0.1 - 1.0 K/uL   Eosinophils Relative 2 %   Eosinophils Absolute 0.2 0.0 - 0.5 K/uL   Basophils Relative 0 %   Basophils Absolute 0.0 0.0 - 0.1 K/uL   Immature Granulocytes 0 %   Abs Immature Granulocytes 0.02 0.00 - 0.07 K/uL    Comment: Performed at [facility], [address] [place], [place] [address]  I-Stat venous blood gas, [facility] ED, [facility], [facility])     Status: Abnormal   Collection Time: 11/08/23  7:50 PM  Result Value Ref Range   pH, Ven 7.338 7.25 - 7.43   pCO2, Ven 39.7 (L) 44 - 60 mmHg   pO2, Ven 45 32 - 45 mmHg   Bicarbonate 21.4 20.0 - 28.0 mmol/L   TCO2 23 22 - 32 mmol/L   O2 Saturation 79 %   Acid-base deficit 4.0 (H) 0.0 - 2.0 mmol/L   Sodium 139 135 - 145 mmol/L   Potassium 3.8 3.5 - 5.1 mmol/L   Calcium , Ion 0.98 (L) 1.15 - 1.40 mmol/L   HCT 31.0 (L) 39.0 - 52.0 %   Hemoglobin 10.5 (L) 13.0 - 17.0 g/dL   Patient temperature 01.7 F    Collection site IV start    Drawn by Nurse    Sample type VENOUS   Resp panel by RT-PCR (RSV, Flu A&B, Covid) Anterior Nasal Swab     Status: None   Collection Time: 11/08/23  8:25 PM   Specimen: Anterior Nasal Swab  Result Value Ref Range   SARS Coronavirus 2 by RT PCR NEGATIVE NEGATIVE    Comment: (NOTE) SARS-CoV-2 target  nucleic acids are NOT DETECTED.  The SARS-CoV-2 RNA is generally detectable in upper respiratory specimens during the acute phase of infection. The lowest concentration of SARS-CoV-2 viral copies this assay can detect is 138 copies/mL. A negative result does not preclude SARS-Cov-2 infection and should not be used as the sole basis for treatment or other patient management decisions. A negative result may occur with  improper specimen collection/handling, submission of specimen other than nasopharyngeal swab, presence of viral mutation(s) within the areas targeted by this assay, and inadequate number of viral copies(<138 copies/mL). A negative result must be combined with clinical observations, patient history, and epidemiological information. The expected result is Negative.  Fact Sheet for Patients:  BloggerCourse.com  Fact Sheet for Healthcare Providers:  SeriousBroker.it  This test is no t yet approved or cleared by the [place]  FDA and  has been authorized for detection and/or diagnosis of SARS-CoV-2 by FDA under an Emergency Use Authorization (EUA). This EUA will remain  in effect (meaning this test can be used) for the duration of the COVID-19 declaration under Section 564(b)(1) of the Act, 21 U.S.C.section 360bbb-3(b)(1), unless the authorization is terminated  or revoked sooner.       Influenza A by PCR NEGATIVE NEGATIVE   Influenza B by PCR NEGATIVE NEGATIVE    Comment: (NOTE) The Xpert Xpress SARS-CoV-2/FLU/RSV plus assay is intended as an aid in the diagnosis of influenza from Nasopharyngeal swab specimens and should not be used as a sole basis for treatment. Nasal washings and aspirates are unacceptable for Xpert Xpress SARS-CoV-2/FLU/RSV testing.  Fact Sheet for Patients: BloggerCourse.com  Fact Sheet for Healthcare Providers: SeriousBroker.it  This test  is not yet approved or cleared by the [place]  FDA and has been authorized for detection and/or diagnosis of SARS-CoV-2 by FDA under  an Emergency Use Authorization (EUA). This EUA will remain in effect (meaning this test can be used) for the duration of the COVID-19 declaration under Section 564(b)(1) of the Act, 21 U.S.C. section 360bbb-3(b)(1), unless the authorization is terminated or revoked.     Resp Syncytial Virus by PCR NEGATIVE NEGATIVE    Comment: (NOTE) Fact Sheet for Patients: BloggerCourse.com  Fact Sheet for Healthcare Providers: SeriousBroker.it  This test is not yet approved or cleared by the [place]  FDA and has been authorized for detection and/or diagnosis of SARS-CoV-2 by FDA under an Emergency Use Authorization (EUA). This EUA will remain in effect (meaning this test can be used) for the duration of the COVID-19 declaration under Section 564(b)(1) of the Act, 21 U.S.C. section 360bbb-3(b)(1), unless the authorization is terminated or revoked.  Performed at [facility], [address], [place], [place] [address]   Troponin T, High Sensitivity     Status: Abnormal   Collection Time: 11/08/23  9:37 PM  Result Value Ref Range   Troponin T High Sensitivity 217 (HH) <19 ng/L    Comment: Critical value noted. Value is consistent with previously reported and called value  (NOTE) Biotin concentrations > 1000 ng/mL falsely decrease TnT results.  Serial cardiac troponin measurements are suggested.  Refer to the Links section for chest pain algorithms and additional  guidance. Performed at [facility], [address] [place], [place] [address]    DG Chest [place] 1 View Result Date: 11/08/2023 CLINICAL DATA:  shob EXAM: PORTABLE CHEST 1 VIEW COMPARISON:  Chest x-ray 06/29/2023, CT chest 08/03/2021 FINDINGS: The heart and mediastinal contours are unchanged. Persistent  enlarged cardiac silhouette. No focal consolidation. Pulmonary edema. No pleural effusion. No pneumothorax. No acute osseous abnormality. IMPRESSION: Pulmonary edema with persistent cardiomegaly. Underlying pericardial effusion not excluded. Electronically Signed   By: Morgane  Naveau M.D.   On: 11/08/2023 20:04    Pending Labs Unresulted Labs (From admission, onward)     Start     Ordered   11/09/23 0500  HIV Antibody (routine testing w rflx)  (HIV Antibody (Routine testing w reflex) panel)  Tomorrow morning,   R        11/08/23 2359   11/09/23 0500  CBC  Tomorrow morning,   R        11/08/23 2359   11/09/23 0500  Basic metabolic panel  Tomorrow morning,   R        11/08/23 2359   11/08/23 2054  Hepatitis B surface antigen  (New Admission Hemo Labs (Hepatitis B))  Once,   URGENT        11/08/23 2056   11/08/23 2054  Hepatitis B surface antibody,quantitative  (New Admission Hemo Labs (Hepatitis B))  Once,   URGENT        11/08/23 2056            Vitals/Pain Today's Vitals   11/08/23 2300 11/08/23 2315 11/08/23 2330 11/09/23 0017  BP: (!) 160/88 (!) 162/84 (!) 157/86   Pulse: 88 88 82   Resp: 15 11 (!) 22   Temp:      TempSrc:      SpO2: 100% 99% 92% 96%  PainSc:        Isolation Precautions No active isolations  Medications Medications  Chlorhexidine  Gluconate Cloth 2 % PADS 6 each (has no administration in time range)  heparin  injection 5,000 Units (has no administration in time range)  sodium chloride  flush (NS) 0.9 % injection 3 mL (  3 mLs Intravenous Given 11/09/23 0010)  acetaminophen  (TYLENOL ) tablet 650 mg (has no administration in time range)    Or  acetaminophen  (TYLENOL ) suppository 650 mg (has no administration in time range)  ondansetron  (ZOFRAN ) tablet 4 mg (has no administration in time range)    Or  ondansetron  (ZOFRAN ) injection 4 mg (has no administration in time range)  senna-docusate (Senokot-S) tablet 1 tablet (has no administration in time range)   amLODipine  (NORVASC ) tablet 10 mg (has no administration in time range)  carvedilol  (COREG ) tablet 25 mg (has no administration in time range)  doxazosin  (CARDURA ) tablet 8 mg (has no administration in time range)  hydrALAZINE  (APRESOLINE ) tablet 100 mg (has no administration in time range)  fluticasone  furoate-vilanterol (BREO ELLIPTA ) 200-25 MCG/ACT 1 puff (has no administration in time range)  albuterol  (PROVENTIL ) (2.5 MG/3ML) 0.083% nebulizer solution 2.5 mg (has no administration in time range)  hydrALAZINE  (APRESOLINE ) injection 10 mg (has no administration in time range)  insulin  aspart (novoLOG ) injection 0-6 Units (has no administration in time range)  ipratropium-albuterol  (DUONEB) 0.5-2.5 (3) MG/3ML nebulizer solution 3 mL (3 mLs Nebulization Given 11/08/23 1959)  hydrALAZINE  (APRESOLINE ) injection 10 mg (10 mg Intravenous Given 11/08/23 1956)  carvedilol  (COREG ) tablet 25 mg (25 mg Oral Given 11/08/23 2201)    Mobility walks     Focused Assessments Pulmonary Assessment Handoff:  Lung sounds: Bilateral Breath Sounds: Diminished L Breath Sounds: Diminished R Breath Sounds: Diminished O2 Device: Room Air O2 Flow Rate (L/min): 2 L/min    R Recommendations: See Admitting Provider Note  Report given to:   Additional Notes:

## 2023-11-09 NOTE — ED Provider Notes (Signed)
 [facility] AT [facility] Provider Note  CSN: [id number] Arrival date & time: 11/09/23 8251  Chief Complaint(s) Chest Pain and Shortness of Breath  HPI Matthew Odom is a 53 y.o. male with past medical history as below, significant for cocaine use, COPD, CAD, ESRD on HD Tuesday Thursday Saturday, thoracic aneurysm who presents to the ED with complaint of palpitations  Reports he was trying to make a sandwich around 5 PM and began to have rapid heart rate, palpitations.  He was not having chest pain or dyspnea at this time.  Symptoms lasted approximate 30 minutes and resolved spontaneously.  Per EMS he was hypoxic but he is not hypoxic here on room air.  He had no lightheadedness or syncope.  No change with urination.  No nausea or vomiting.  Denies recent cocaine use.  Last dialysis was this morning.  Past Medical History Past Medical History:  Diagnosis Date   Anxiety    BPH (benign prostatic hyperplasia)    Cocaine use    07/15/21- last time was more than 10 years ago.   Complication of anesthesia    wakes up during surgery   COPD (chronic obstructive pulmonary disease) (HCC)    Coronary artery disease 2021   mild non obstructed   Diabetes mellitus without complication (HCC)    Enlarged heart    ESRD (end stage renal disease) (HCC)    TTHSAT   History of degenerative disc disease    Hyperlipidemia    Hypertension    NSTEMI (non-ST elevated myocardial infarction) (HCC)    Pneumonia    Sciatic nerve pain    Sleep apnea    Stroke (HCC)    no residual, x 3 last one was in 2020   Thoracic ascending aortic aneurysm (HCC)    4.2 cm 08/03/21 CTA chest   Patient Active Problem List   Diagnosis Date Noted   Anemia of chronic disease 11/08/2023   Acute on chronic heart failure with preserved ejection fraction (HFpEF) (HCC) 11/08/2023   SVT (supraventricular tachycardia) (HCC) 11/12/2021   COPD not affecting current episode of care 11/12/2021   OSA on  CPAP 10/11/2021   Type 2 diabetes mellitus with chronic kidney disease on chronic dialysis, with long-term current use of insulin  (HCC) 06/19/2021   AKI (acute kidney injury) (HCC) 05/07/2021   Hyperglycemia due to diabetes mellitus (HCC) 05/07/2021   Acute-on-chronic kidney injury (HCC) 05/07/2021   Hypokalemia 05/07/2021   Hypocalcemia 05/07/2021   Hyperglycemia 11/08/2020   Orthostatic hypotension 11/28/2019   Type 2 diabetes mellitus, with long-term current use of insulin  (HCC) 09/27/2018   Chest pain 06/29/2018   Elevated troponin 06/29/2018   Degenerative spondylolisthesis 04/04/2018   Lumbar radiculopathy 04/04/2018   Spinal stenosis of lumbar region with neurogenic claudication 04/04/2018   Synovial cyst of lumbar facet joint 04/04/2018   ESRD on dialysis (HCC) 01/03/2018   Hypertension associated with diabetes (HCC) 01/02/2018   Hyperlipidemia associated with type 2 diabetes mellitus (HCC) 01/02/2018   Abnormal EKG 01/02/2018   COPD with acute bronchitis (HCC) 01/02/2018   Nicotine  abuse 01/02/2018   Chronic combined systolic and diastolic CHF, NYHA class 3 (HCC) 02/15/2016   LVH (left ventricular hypertrophy) due to hypertensive disease 02/15/2016   Nonischemic cardiomyopathy (HCC) 03/23/2015   Home Medication(s) Prior to Admission medications   Medication Sig Start Date End Date Taking? Authorizing Provider  acetaminophen  (TYLENOL ) 500 MG tablet Take 500-1,000 mg by mouth every 6 (six) hours as needed (pain.).  [provider]  albuterol  (PROVENTIL ) (2.5 MG/3ML) 0.083% nebulizer solution Take 3 mLs (2.5 mg total) by nebulization every 6 (six) hours as needed for wheezing or shortness of breath. 01/14/23   Lorren Greig PARAS, NP  albuterol  (PROVENTIL ) (2.5 MG/3ML) 0.083% nebulizer solution Take 3 mLs (2.5 mg total) by nebulization every 6 (six) hours as needed for wheezing or shortness of breath. 05/25/23   Laurice Maude BROCKS, MD  amLODipine  (NORVASC ) 10 MG tablet Take 1  tablet (10 mg total) by mouth daily. 12/05/22   O'NealDarryle Ned, MD  aspirin  EC 81 MG tablet Take one tablet by mouth twice daily for 30 days after sugery for blood clot prevention. Start post op day 1. Swallow whole. 06/18/23   [place], Jesse J, PA-C  atorvastatin  (LIPITOR) 80 MG tablet Take 1 tablet (80 mg total) by mouth daily. Patient not taking: Reported on 06/15/2023 11/13/21 01/14/23  Akula, Vijaya, MD  budesonide -formoterol  (SYMBICORT ) 160-4.5 MCG/ACT inhaler Inhale 2 puffs into the lungs 2 (two) times daily. 01/14/23   Lorren Greig PARAS, NP  calcium  acetate (PHOSLO ) 667 MG capsule Take 2 capsules (1,334 mg total) by mouth 3 (three) times daily with meals 06/25/21     Calcium  Carbonate Antacid (TUMS PO) Take 2 tablets by mouth 3 (three) times daily with meals. Patient not taking: Reported on 09/11/2023    [provider]  carvedilol  (COREG ) 25 MG tablet Take 1 tablet (25 mg total) by mouth 2 (two) times daily with a meal. 12/05/22   O'Neal, Darryle Ned, MD  doxazosin  (CARDURA ) 4 MG tablet Take 12 mg by mouth at bedtime. Patient not taking: Reported on 09/11/2023    [provider]  doxazosin  (CARDURA ) 8 MG tablet Take 1 tablet (8 mg total) by mouth daily. 12/05/22   O'NealDarryle Ned, MD  hydrALAZINE  (APRESOLINE ) 100 MG tablet Take 1 tablet (100 mg total) by mouth 3 (three) times daily. 12/05/22   O'NealDarryle Ned, MD  Insulin  Glargine (BASAGLAR  KWIKPEN) 100 UNIT/ML Inject 20 Units into the skin daily. Patient taking differently: Inject 10 Units into the skin 2 (two) times daily. 06/19/21   Shamleffer, Ibtehal Jaralla, MD  insulin  lispro (HUMALOG ) 100 UNIT/ML injection Inject 0-4 Units into the skin 3 (three) times daily as needed for high blood sugar.    [provider]  Insulin  Pen Needle 31G X 6 MM MISC Use as directed in the morning, at noon, in the evening, and at bedtime. 06/19/21   Shamleffer, Donell Cardinal, MD  liraglutide  (VICTOZA ) 18 MG/3ML SOPN Inject 1.8 mg  into the skin daily. 06/19/21   Shamleffer, Ibtehal Jaralla, MD  nicotine  (NICODERM CQ  - DOSED IN MG/24 HOURS) 14 mg/24hr patch Place 1 patch (14 mg total) onto the skin daily as needed. Patient not taking: Reported on 09/11/2023 01/14/23   Lorren Greig PARAS, NP  nitroGLYCERIN  (NITROSTAT ) 0.4 MG SL tablet Place 1 tablet (0.4 mg total) under the tongue every 5 (five) minutes as needed for up to 30 doses for chest pain. Patient not taking: Reported on 09/11/2023 07/16/21   Cottie Donnice PARAS, MD  oxyCODONE  (ROXICODONE ) 5 MG immediate release tablet Take 1 tablet (5 mg total) by mouth every 4 (four) hours as needed (for post op pain). 10/14/23   Yolande Lamar BROCKS, MD  sertraline  (ZOLOFT ) 25 MG tablet Take 1 tablet (25 mg total) by mouth daily. Patient not taking: Reported on 09/11/2023 01/14/23   Lorren Greig PARAS, NP  Past Surgical History Past Surgical History:  Procedure Laterality Date   ANKLE ARTHROSCOPY Left 06/18/2023   Procedure: LEFT ANKLE ARTHROSCOPIC DEBRIDEMENT;  Surgeon: Elsa Lonni SAUNDERS, MD;  Location: [facility];  Service: Orthopedics;  Laterality: Left;   AV FISTULA PLACEMENT Left 07/17/2021   Procedure: CREATION  OF LEFT ARM RADIOCEPHALIC ARTERIOVENOUS (AV) FISTULA;  Surgeon: Serene Gaile ORN, MD;  Location: [facility];  Service: Vascular;  Laterality: Left;   CARDIAC CATHETERIZATION     IR FLUORO GUIDE CV LINE RIGHT  05/20/2021   IR US  GUIDE VASC ACCESS RIGHT  05/20/2021   LIGATION OF COMPETING BRANCHES OF ARTERIOVENOUS FISTULA Left 09/13/2021   Procedure: LIGATION AND ELEVATION OF COMPETING BRANCHES OF LEFT ARM ARTERIOVENOUS FISTULA;  Surgeon: Serene Gaile ORN, MD;  Location: [facility];  Service: Vascular;  Laterality: Left;   ORIF ANKLE FRACTURE Left 06/18/2023   Procedure: OPEN TREATMENT LATERAL MALLEOLUS;  Surgeon: Elsa Lonni SAUNDERS, MD;  Location: [facility];  Service:  Orthopedics;  Laterality: Left;   SYNDESMOSIS REPAIR Left 06/18/2023   Procedure: SYNDESMOSIS REPAIR;  Surgeon: Elsa Lonni SAUNDERS, MD;  Location: [facility];  Service: Orthopedics;  Laterality: Left;   Family History Family History  Problem Relation Age of Onset   Heart attack Mother    Hypertension Mother    Diabetes Mother    Heart disease Sister    Hypertension Sister    Hypertension Father    Emphysema Father     Social History Social History   Tobacco Use   Smoking status: Every Day    Current packs/day: 0.50    Average packs/day: 0.5 packs/day for 40.0 years (20.0 ttl pk-yrs)    Types: Cigarettes   Smokeless tobacco: Never   Tobacco comments:    Started smoking again in April.  1/2 ppd.    Vaping Use   Vaping status: Never Used  Substance Use Topics   Alcohol use: Not Currently   Drug use: Not Currently    Comment: years ago per patient   Allergies Aspirin  and Nitroglycerin   Review of Systems A thorough review of systems was obtained and all systems are negative except as noted in the HPI and PMH.   Physical Exam Vital Signs  I have reviewed the triage vital signs BP (!) 154/99 (BP Location: Right Arm)   Pulse (!) 102   Temp 98.4 F (36.9 C) (Oral)   Resp 18   SpO2 100%  Physical Exam Vitals and nursing note reviewed.  Constitutional:      General: He is not in acute distress.    Appearance: He is well-developed. He is obese.  HENT:     Head: Normocephalic and atraumatic.     Right Ear: External ear normal.     Left Ear: External ear normal.     Mouth/Throat:     Mouth: Mucous membranes are moist.  Eyes:     General: No scleral icterus. Cardiovascular:     Rate and Rhythm: Regular rhythm. Tachycardia present.     Pulses: Normal pulses.     Heart sounds: Normal heart sounds.  Pulmonary:     Effort: Pulmonary effort is normal. No respiratory distress.     Breath sounds: Normal breath sounds.  Abdominal:     General: Abdomen is flat.      Palpations: Abdomen is soft.     Tenderness: There is no abdominal tenderness.  Musculoskeletal:     Cervical back: No rigidity.     Right lower leg: No edema.  Left lower leg: No edema.     Comments: HD access left forearm, palpable thrill  Skin:    General: Skin is warm and dry.     Capillary Refill: Capillary refill takes less than 2 seconds.  Neurological:     Mental Status: He is alert.  Psychiatric:        Mood and Affect: Mood normal.        Behavior: Behavior normal.     ED Results and Treatments Labs (all labs ordered are listed, but only abnormal results are displayed) Labs Reviewed  BASIC METABOLIC PANEL WITH GFR - Abnormal; Notable for the following components:      Result Value   Potassium 3.4 (*)    Glucose, Bld 236 (*)    BUN 68 (*)    Creatinine, Ser 9.92 (*)    Calcium  8.3 (*)    GFR, Estimated 6 (*)    All other components within normal limits  CBC - Abnormal; Notable for the following components:   RBC 3.74 (*)    Hemoglobin 10.3 (*)    HCT 32.4 (*)    All other components within normal limits  BRAIN NATRIURETIC PEPTIDE - Abnormal; Notable for the following components:   B Natriuretic Peptide 664.8 (*)    All other components within normal limits  RAPID URINE DRUG SCREEN, HOSP PERFORMED - Abnormal; Notable for the following components:   Cocaine POSITIVE (*)    All other components within normal limits  TROPONIN I (HIGH SENSITIVITY) - Abnormal; Notable for the following components:   Troponin I (High Sensitivity) 140 (*)    All other components within normal limits  TROPONIN I (HIGH SENSITIVITY) - Abnormal; Notable for the following components:   Troponin I (High Sensitivity) 143 (*)    All other components within normal limits  TSH  T4, FREE                                                                                                                          Radiology DG Chest Portable 1 View Result Date: 11/09/2023 CLINICAL DATA:   Shortness of breath. Chest pain beginning 30 minutes ago. Dialysis today. EXAM: PORTABLE CHEST 1 VIEW COMPARISON:  11/08/2023 FINDINGS: Cardiac enlargement. No vascular congestion, edema, or consolidation. No pleural effusion or pneumothorax. Mediastinal contours appear intact. IMPRESSION: Cardiac enlargement.  No evidence of active pulmonary disease. Electronically Signed   By: Elsie Gravely M.D.   On: 11/09/2023 19:38    Pertinent labs & imaging results that were available during my care of the patient were reviewed by me and considered in my medical decision making (see MDM for details).  Medications Ordered in ED Medications - No data to display  Procedures Procedures  (including critical care time)  Medical Decision Making / ED Course    Medical Decision Making:    Matthew Odom is a 54 y.o. male  with past medical history as below, significant for cocaine use, COPD, CAD, ESRD on HD Tuesday Thursday Saturday, thoracic aneurysm who presents to the ED with complaint of palpitations. The complaint involves an extensive differential diagnosis and also carries with it a high risk of complications and morbidity.  Serious etiology was considered. Ddx includes but is not limited to: SVT, V. tach, A-fib, sinus tachycardia, thyroid  disturbance, dehydration, electrolyte derangement, stimulant use, etc.  Complete initial physical exam performed, notably the patient was in no acute distress, heart rate is mildly elevated, no dyspnea or hypoxia.    Reviewed and confirmed nursing documentation for past medical history, family history, social history.  Vital signs reviewed.    Palpitations > - Symptoms resolved prior to arrival, not associate with chest pain or dyspnea although was report that he was hypoxic at some point but this has since resolved. - denies recent  cocaine - last HD was this AM, received around 2 hrs per pt; was feeling fine following HD - No recurrence of symptoms while in the ER, troponin is elevated but flat - UDS shows cocaine > likely source of palpitations or at very least a significant factor  - Labs are stable otherwise  Clinical Course as of 11/09/23 2333  Mon Nov 09, 2023  1936 Troponin I (High Sensitivity)(!!): 140 No cp [SG]  2036 Feeling better [SG]  2324 Does not appear to require urgent HD at this time, last HD earlier today [SG]    Clinical Course User Index [SG] Elnor Jayson LABOR, DO     Was notified by nursing that patient eloped from the ER.               Additional history obtained: -Additional history obtained from na -External records from outside source obtained and reviewed including: Chart review including previous notes, labs, imaging, consultation notes including  Recent admission, home medications, prior labs and imaging, x-ray yesterday with pulmonary edema   Lab Tests: -I ordered, reviewed, and interpreted labs.   The pertinent results include:   Labs Reviewed  BASIC METABOLIC PANEL WITH GFR - Abnormal; Notable for the following components:      Result Value   Potassium 3.4 (*)    Glucose, Bld 236 (*)    BUN 68 (*)    Creatinine, Ser 9.92 (*)    Calcium  8.3 (*)    GFR, Estimated 6 (*)    All other components within normal limits  CBC - Abnormal; Notable for the following components:   RBC 3.74 (*)    Hemoglobin 10.3 (*)    HCT 32.4 (*)    All other components within normal limits  BRAIN NATRIURETIC PEPTIDE - Abnormal; Notable for the following components:   B Natriuretic Peptide 664.8 (*)    All other components within normal limits  RAPID URINE DRUG SCREEN, HOSP PERFORMED - Abnormal; Notable for the following components:   Cocaine POSITIVE (*)    All other components within normal limits  TROPONIN I (HIGH SENSITIVITY) - Abnormal; Notable for the following components:    Troponin I (High Sensitivity) 140 (*)    All other components within normal limits  TROPONIN I (HIGH SENSITIVITY) - Abnormal; Notable for the following components:   Troponin I (High Sensitivity) 143 (*)    All other components within normal  limits  TSH  T4, FREE    Notable for as above  EKG   EKG Interpretation Date/Time:    Ventricular Rate:    PR Interval:    QRS Duration:    QT Interval:    QTC Calculation:   R Axis:      Text Interpretation:           Imaging Studies ordered: I ordered imaging studies including CXR I independently visualized the following imaging with scope of interpretation limited to determining acute life threatening conditions related to emergency care; findings noted above I agree with the radiologist interpretation If any imaging was obtained with contrast I closely monitored patient for any possible adverse reaction a/w contrast administration in the emergency department   Medicines ordered and prescription drug management: No orders of the defined types were placed in this encounter.   -I have reviewed the patients home medicines and have made adjustments as needed   Consultations Obtained: na   Cardiac Monitoring: The patient was maintained on a cardiac monitor.  I personally viewed and interpreted the cardiac monitored which showed an underlying rhythm of: sinus tachy > NSR Continuous pulse oximetry interpreted by myself, 100% on RA.    Social Determinants of Health:  Diagnosis or treatment significantly limited by social determinants of health: current smoker, obesity, and polysubstance abuse Counseled patient for approximately 3 minutes regarding smoking cessation. Discussed risks of smoking and how they applied and affected their visit here today. Patient not ready to quit at this time, however will follow up with their primary doctor when they are.   CPT code: 00593: intermediate counseling for smoking cessation  Recommend  cocaine cessation      Reevaluation: After the interventions noted above, I reevaluated the patient and found that they have improved  Co morbidities that complicate the patient evaluation  Past Medical History:  Diagnosis Date   Anxiety    BPH (benign prostatic hyperplasia)    Cocaine use    07/15/21- last time was more than 10 years ago.   Complication of anesthesia    wakes up during surgery   COPD (chronic obstructive pulmonary disease) (HCC)    Coronary artery disease 2021   mild non obstructed   Diabetes mellitus without complication (HCC)    Enlarged heart    ESRD (end stage renal disease) (HCC)    TTHSAT   History of degenerative disc disease    Hyperlipidemia    Hypertension    NSTEMI (non-ST elevated myocardial infarction) (HCC)    Pneumonia    Sciatic nerve pain    Sleep apnea    Stroke (HCC)    no residual, x 3 last one was in 2020   Thoracic ascending aortic aneurysm (HCC)    4.2 cm 08/03/21 CTA chest      Dispostion: Disposition decision including need for hospitalization was considered, and patient Eloped    Final Clinical Impression(s) / ED Diagnoses Final diagnoses:  Palpitations  Cocaine use        Elnor Jayson LABOR, DO 11/09/23 2333

## 2023-11-09 NOTE — Hospital Course (Signed)
 Matthew Odom is a 53 y.o. male with medical history significant for ESRD on TTS HD, NICM/HFrecEF, COPD, T2DM, HTN, HLD, anemia of chronic disease, BPH, OSA not using CPAP who is admitted with acute on chronic HFpEF/pulmonary edema.  Nephrology were consulted and have placed orders for emergent dialysis.

## 2023-11-09 NOTE — H&P (Incomplete)
 History and Physical    Matthew Odom FMW:[id number] DOB: [birth year] DOA: 11/08/2023  PCP: Lorren Greig PARAS, NP  Patient coming from: Home  I have personally briefly reviewed patient's old medical records in [facility]  Chief Complaint: Shortness of breath  HPI: Matthew Odom is a 53 y.o. male with medical history significant for ESRD on TTS HD, NICM/HFrecEF, COPD, T2DM, HTN, HLD, anemia of chronic disease, BPH, OSA not using CPAP who presented to the ED for evaluation of shortness of breath.  Patient states he normally dialyzes Tuesday, Thursday, Saturday.  He said he missed yesterday (Saturday) dialysis session.  Today he has been experiencing new onset shortness of breath.  He denies chest pain or cough.  He has not seen any increased swelling in his lower extremities.  He says he still makes small amount of urine.  ED Course  Labs/Imaging on admission: I have personally reviewed following labs and imaging studies.  [facility].  Initial vitals showed BP 206/109, pulse 96, RR 14, temp 99.1 F, SpO2 99% on 2 L O2 via [place].  Labs showed troponin T 215 > 217, proBNP 13,877, BUN 84, creatinine 13.50, sodium 140, potassium 4.0, bicarb 20, serum glucose 163, LFTs within normal limits, WBC 10.1, hemoglobin 9.8, platelets 238.  SARS-CoV-2, influenza, RSV PCR negative.  Portable chest x-ray showed pulmonary edema with persistent cardiomegaly.  No focal consolidation, effusion, or pneumothorax.  Patient was given IV hydralazine  10 mg and oral Coreg  25 mg with improvement in blood pressure.  EDP consulted nephrology (Dr. Tobie) who recommended ED to ED transfer to [facility] and placed orders for emergent dialysis although unclear if HD can be performed overnight.  The hospitalist service was consulted to admit.  Review of Systems: All systems reviewed and are negative except as documented in history of present illness above.   Past Medical History:   Diagnosis Date  . Anxiety   . BPH (benign prostatic hyperplasia)   . Cocaine use    07/15/21- last time was more than 10 years ago.  . Complication of anesthesia    wakes up during surgery  . COPD (chronic obstructive pulmonary disease) (HCC)   . Coronary artery disease 2021   mild non obstructed  . Diabetes mellitus without complication (HCC)   . Enlarged heart   . ESRD (end stage renal disease) (HCC)    TTHSAT  . History of degenerative disc disease   . Hyperlipidemia   . Hypertension   . NSTEMI (non-ST elevated myocardial infarction) (HCC)   . Pneumonia   . Sciatic nerve pain   . Sleep apnea   . Stroke [facility])    no residual, x 3 last one was in 2020  . Thoracic ascending aortic aneurysm (HCC)    4.2 cm 08/03/21 CTA chest    Past Surgical History:  Procedure Laterality Date  . ANKLE ARTHROSCOPY Left 06/18/2023   Procedure: LEFT ANKLE ARTHROSCOPIC DEBRIDEMENT;  Surgeon: Elsa Lonni SAUNDERS, MD;  Location: [facility] ORS;  Service: Orthopedics;  Laterality: Left;  . AV FISTULA PLACEMENT Left 07/17/2021   Procedure: CREATION  OF LEFT ARM RADIOCEPHALIC ARTERIOVENOUS (AV) FISTULA;  Surgeon: Serene Gaile ORN, MD;  Location: [facility] OR;  Service: Vascular;  Laterality: Left;  . CARDIAC CATHETERIZATION    . IR FLUORO GUIDE CV LINE RIGHT  05/20/2021  . IR US  GUIDE VASC ACCESS RIGHT  05/20/2021  . LIGATION OF COMPETING BRANCHES OF ARTERIOVENOUS FISTULA Left 09/13/2021   Procedure:  LIGATION AND ELEVATION OF COMPETING BRANCHES OF LEFT ARM ARTERIOVENOUS FISTULA;  Surgeon: Serene Gaile ORN, MD;  Location: [facility] OR;  Service: Vascular;  Laterality: Left;  . ORIF ANKLE FRACTURE Left 06/18/2023   Procedure: OPEN TREATMENT LATERAL MALLEOLUS;  Surgeon: Elsa Lonni SAUNDERS, MD;  Location: [facility] ORS;  Service: Orthopedics;  Laterality: Left;  . SYNDESMOSIS REPAIR Left 06/18/2023   Procedure: SYNDESMOSIS REPAIR;  Surgeon: Elsa Lonni SAUNDERS, MD;  Location: [facility] ORS;  Service: Orthopedics;  Laterality: Left;     Social History: Social History   Tobacco Use  . Smoking status: Every Day    Current packs/day: 0.50    Average packs/day: 0.5 packs/day for 40.0 years (20.0 ttl pk-yrs)    Types: Cigarettes  . Smokeless tobacco: Never  . Tobacco comments:    Started smoking again in April.  1/2 ppd.    Vaping Use  . Vaping status: Never Used  Substance Use Topics  . Alcohol use: Not Currently  . Drug use: Not Currently    Comment: years ago per patient   Allergies  Allergen Reactions  . Aspirin  Hives  . Nitroglycerin  Other (See Comments)    Nitroglycerin  paste or IV will make blood pressure go up and cause headaches. The nitroglycerin  tablets do fine.    Family History  Problem Relation Age of Onset  . Heart attack Mother   . Hypertension Mother   . Diabetes Mother   . Heart disease Sister   . Hypertension Sister   . Hypertension Father   . Emphysema Father      Prior to Admission medications   Medication Sig Start Date End Date Taking? Authorizing Provider  acetaminophen  (TYLENOL ) 500 MG tablet Take 500-1,000 mg by mouth every 6 (six) hours as needed (pain.).    [provider]  albuterol  (PROVENTIL ) (2.5 MG/3ML) 0.083% nebulizer solution Take 3 mLs (2.5 mg total) by nebulization every 6 (six) hours as needed for wheezing or shortness of breath. 01/14/23   Lorren Greig PARAS, NP  albuterol  (PROVENTIL ) (2.5 MG/3ML) 0.083% nebulizer solution Take 3 mLs (2.5 mg total) by nebulization every 6 (six) hours as needed for wheezing or shortness of breath. 05/25/23   Laurice Maude BROCKS, MD  amLODipine  (NORVASC ) 10 MG tablet Take 1 tablet (10 mg total) by mouth daily. 12/05/22   O'NealDarryle Ned, MD  aspirin  EC 81 MG tablet Take one tablet by mouth twice daily for 30 days after sugery for blood clot prevention. Start post op day 1. Swallow whole. 06/18/23   [place], Jesse J, PA-C  atorvastatin  (LIPITOR) 80 MG tablet Take 1 tablet (80 mg total) by mouth daily. Patient not taking:  Reported on 06/15/2023 11/13/21 01/14/23  Akula, Vijaya, MD  budesonide -formoterol  (SYMBICORT ) 160-4.5 MCG/ACT inhaler Inhale 2 puffs into the lungs 2 (two) times daily. 01/14/23   Lorren Greig PARAS, NP  calcium  acetate (PHOSLO ) 667 MG capsule Take 2 capsules (1,334 mg total) by mouth 3 (three) times daily with meals 06/25/21     Calcium  Carbonate Antacid (TUMS PO) Take 2 tablets by mouth 3 (three) times daily with meals. Patient not taking: Reported on 09/11/2023    [provider]  carvedilol  (COREG ) 25 MG tablet Take 1 tablet (25 mg total) by mouth 2 (two) times daily with a meal. 12/05/22   O'Neal, Darryle Ned, MD  doxazosin  (CARDURA ) 4 MG tablet Take 12 mg by mouth at bedtime. Patient not taking: Reported on 09/11/2023    [provider]  doxazosin  (CARDURA ) 8 MG  tablet Take 1 tablet (8 mg total) by mouth daily. 12/05/22   O'NealDarryle Ned, MD  doxycycline  (VIBRAMYCIN ) 100 MG capsule Take 1 capsule (100 mg total) by mouth 2 (two) times daily. 10/14/23   Yolande Lamar BROCKS, MD  hydrALAZINE  (APRESOLINE ) 100 MG tablet Take 1 tablet (100 mg total) by mouth 3 (three) times daily. 12/05/22   O'NealDarryle Ned, MD  Insulin  Glargine (BASAGLAR  KWIKPEN) 100 UNIT/ML Inject 20 Units into the skin daily. Patient taking differently: Inject 10 Units into the skin 2 (two) times daily. 06/19/21   Shamleffer, Ibtehal Jaralla, MD  insulin  lispro (HUMALOG ) 100 UNIT/ML injection Inject 0-4 Units into the skin 3 (three) times daily as needed for high blood sugar.    [provider]  Insulin  Pen Needle 31G X 6 MM MISC Use as directed in the morning, at noon, in the evening, and at bedtime. 06/19/21   Shamleffer, Donell Cardinal, MD  liraglutide  (VICTOZA ) 18 MG/3ML SOPN Inject 1.8 mg into the skin daily. 06/19/21   Shamleffer, Donell Cardinal, MD  Misc. Devices MISC Please provide patient with insurance approved CPAP with the following settings:  autopap 15-20.  Large size [employer] Full Face Mask  Forma mask and heated humidification. 12/25/19   Fleming, Zelda W, NP  nicotine  (NICODERM CQ  - DOSED IN MG/24 HOURS) 14 mg/24hr patch Place 1 patch (14 mg total) onto the skin daily as needed. Patient not taking: Reported on 09/11/2023 01/14/23   Lorren Greig PARAS, NP  nitroGLYCERIN  (NITROSTAT ) 0.4 MG SL tablet Place 1 tablet (0.4 mg total) under the tongue every 5 (five) minutes as needed for up to 30 doses for chest pain. Patient not taking: Reported on 09/11/2023 07/16/21   Cottie Donnice PARAS, MD  oxyCODONE  (ROXICODONE ) 5 MG immediate release tablet Take 1 tablet (5 mg total) by mouth every 4 (four) hours as needed (for post op pain). 10/14/23   Yolande Lamar BROCKS, MD  sertraline  (ZOLOFT ) 25 MG tablet Take 1 tablet (25 mg total) by mouth daily. Patient not taking: Reported on 09/11/2023 01/14/23   Lorren Greig PARAS, NP    Physical Exam: Vitals:   11/08/23 2259 11/08/23 2300 11/08/23 2315 11/08/23 2330  BP:  (!) 160/88 (!) 162/84 (!) 157/86  Pulse:  88 88 82  Resp:  15 11 (!) 22  Temp: 99.1 F (37.3 C)     TempSrc: Oral     SpO2:  100% 99% 92%   Constitutional: Resting in bed, NAD, calm, comfortable Eyes: EOMI, lids and conjunctivae normal ENMT: Mucous membranes are moist. Posterior pharynx clear of any exudate or lesions.Normal dentition.  Neck: normal, supple, no masses. Respiratory: Distant breath sounds otherwise clear to auscultation. Normal respiratory effort while on 2 L O2 [place]. No accessory muscle use.  Cardiovascular: Regular rate and rhythm, no murmurs / rubs / gallops. No extremity edema. 2+ pedal pulses.  LUE AVF with palpable thrill. Abdomen: no tenderness, no masses palpated. Musculoskeletal: no clubbing / cyanosis. No joint deformity upper and lower extremities. Good ROM, no contractures. Normal muscle tone.  Skin: no rashes, lesions, ulcers. No induration Neurologic: Sensation intact. Strength 5/5 in all 4.  Psychiatric: Normal judgment and insight. Alert and oriented x 3. Normal  mood.   EKG: Personally reviewed. Sinus tachycardia, rate 101, PVC, nonspecific IVCD, repolarization changes, T wave inversion lead V5-6.  T wave change new when compared to previous.  Assessment/Plan Principal Problem:   Acute on chronic heart failure with preserved ejection fraction (HFpEF) (HCC) Active Problems:  ESRD on dialysis (HCC)   Hypertension associated with diabetes (HCC)   Nonischemic cardiomyopathy (HCC)   Hyperlipidemia associated with type 2 diabetes mellitus (HCC)   Elevated troponin   Type 2 diabetes mellitus with chronic kidney disease on chronic dialysis, with long-term current use of insulin  (HCC)   Anemia of chronic disease   Matthew Odom is a 53 y.o. male with medical history significant for ESRD on TTS HD, NICM/HFrecEF, COPD, T2DM, HTN, HLD, anemia of chronic disease, BPH, OSA not using CPAP who is admitted with acute on chronic HFpEF/pulmonary edema.  Nephrology were consulted and have placed orders for emergent dialysis. *** Assessment and Plan: Pulmonary edema/acute on chronic HFpEF: ESRD on TTS HD: ***  NICM/elevated troponin: ***  COPD: ***  Hypertension: ***  Type 2 diabetes: ***  Anemia of chronic disease: ***  Hyperlipidemia: ***  BPH: ***  Tobacco use: ***    DVT prophylaxis: ***  Code Status: ***  Family Communication: ***  Disposition Plan: ***  Consults called: Nephrology Severity of Illness: {Observation/Inpatient:21159}  Jorie Blanch MD [facility]  If 7PM-7AM, please contact night-coverage [email or web address]  11/09/2023, 12:02 AM

## 2023-11-09 NOTE — Progress Notes (Signed)
   11/09/23 1200  Vitals  Temp 97.7 F (36.5 C)  Pulse Rate 92  Resp 16  BP 131/70  SpO2 91 %  Weight 110.8 kg (STANDING)  Type of Weight Post-Dialysis  Oxygen Therapy  O2 Flow Rate (L/min) 2 L/min  Post Treatment  Dialyzer Clearance Lightly streaked  Hemodialysis Intake (mL) 0 mL  Liters Processed 72  Fluid Removed (mL) 4000 mL  Tolerated HD Treatment Yes  AVG/AVF Arterial Site Held (minutes) 7 minutes  AVG/AVF Venous Site Held (minutes) 7 minutes   Received patient in bed to unit.  Alert and oriented.  Informed consent signed and in chart.   TX duration:3HRS  Patient tolerated well.  Transported back to the room  Alert, without acute distress.  Hand-off given to patient's nurse.   Access used: LAVF Access issues: NONE  Total UF removed: 4L Medication(s) given: NONE    [facility]

## 2023-11-09 NOTE — Procedures (Addendum)
 I was present at the procedure, reviewed the HD regimen and made appropriate changes.   Myer [name] MD  [facility] 11/09/2023, 12:53 PM

## 2023-11-09 NOTE — Discharge Planning (Signed)
 [facility] Patient Discharge Orders- [facility] CLINIC: [facility]  Patient's name: Matthew Odom Admit/DC Dates: 11/08/2023 - 11/09/2023  Discharge Diagnoses: Pulmonary edema/Volume overload 2/2 missed dialysis    Recent Labs  Lab 11/08/23 1947 11/08/23 1950 11/09/23 0437  HGB 9.8*   < > 9.7*  K 4.0   < > 3.8  CALCIUM  8.5*  --  8.1*  ALBUMIN 4.2  --   --    < > = values in this interval not displayed.   Outpatient Dialysis Orders:  -Heparin : No change  -EDW 107.5 kg   -[place]: No change   Aranesp: Given: --   Date of last dose/amount: --   PRBC's Given: -- Date/# of units: -- ESA dose for discharge:  No change    Access intervention/Change:  Appears shuntogram to be rescheduled   Medication Orders -IV Antibiotics: --   OTHER/APPTS/LABS   Completed by: Maisie Ronnald Acosta PA-C   D/C Meds to be reconciled by nurse after every discharge.    Reviewed by: MD:______ RN_______

## 2023-11-09 NOTE — ED Notes (Signed)
 EDP notified that patient left AMA . Seen by secretary walking out with family .

## 2023-11-09 NOTE — ED Notes (Signed)
 EDP notified on elevated Trop result.

## 2023-11-09 NOTE — Discharge Summary (Signed)
 Physician Discharge Summary  Matthew Odom FMW:[id number] DOB: 09-14-1970 DOA: 11/08/2023  PCP: Lorren Greig PARAS, NP  Admit date: 11/08/2023 Discharge date: 11/09/2023  Admitted From: Home Disposition: Home Home  Recommendations for Outpatient Follow-up:  Follow up with PCP in 1-2 weeks Please obtain BMP/CBC in one week your next doctors visit.  Continue outpatient home medications prior to admission.  No medications have been adjusted or changed during this hospitalization   Discharge Condition: Stable CODE STATUS: fULL cODE Diet recommendation: rENAL  Brief/Interim Summary: Brief Narrative:  53 year old with history of ESRD on TTS HD, congestive heart failure with reduced EF, COPD, DM 2, HTN, HLD, anemia of chronic disease, BPH, OSA not on CPAP comes to the hospital with shortness of breath after missing dialysis.  Chest x-ray showed pulmonary edema and persistent cardiomegaly.  EDP consulted nephrology.  Patient underwent dialysis on 7/7 and felt significantly better.  Medically cleared for discharge.   Assessment & Plan:  Principal Problem:   Acute on chronic heart failure with preserved ejection fraction (HFpEF) (HCC) Active Problems:   ESRD on dialysis (HCC)   Hypertension associated with diabetes (HCC)   Nonischemic cardiomyopathy (HCC)   Hyperlipidemia associated with type 2 diabetes mellitus (HCC)   Elevated troponin   Type 2 diabetes mellitus with chronic kidney disease on chronic dialysis, with long-term current use of insulin  (HCC)   Anemia of chronic disease    Pulmonary edema/acute on chronic HFpEF: ESRD on TTS HD: Volume overload secondary to missing  dialysis per nephrology.  Feeling much better today after HD. - He is scheduled for AV shunt intervention with Dr. Magda on 7/7.  Discussed with vascular PA, will reschedule his appointment   NICM/elevated troponin: Troponin T elevated 215 > 217.  Demand ischemia in the setting of volume overload   COPD: No  wheezing or sign of COPD exacerbation on admission.  Continue Breo Ellipta  and albuterol  as needed.   Hypertension: Provide after hemodialysis.  Resume home medications   Type 2 diabetes: Resume home regimen upon discharge   Anemia of chronic disease: Hemoglobin stable.  Continue monitor.   Hyperlipidemia: Unclear if patient still taking statin.   BPH: Continue doxazosin .   DVT prophylaxis: heparin  injection 5,000 Units Start: 11/09/23 0600 Code Status: Full code Family Communication:  Disposition Plan: Charge today  Subjective:  Seen at bedside during his hemodialysis. No complaints. Wants to sit out in the chair during HD. I told to ask his HD RN as I m not aware of protocols.   Examination:  General exam: Appears calm and comfortable  Respiratory system: Clear to auscultation. Respiratory effort normal. Cardiovascular system: S1 & S2 heard, RRR. No JVD, murmurs, rubs, gallops or clicks. No pedal edema. Gastrointestinal system: Abdomen is nondistended, soft and nontender. No organomegaly or masses felt. Normal bowel sounds heard. Central nervous system: Alert and oriented. No focal neurological deficits. Extremities: Symmetric 5 x 5 power. Skin: No rashes, lesions or ulcers Psychiatry: Judgement and insight appear normal. Mood & affect appropriate.    Discharge Diagnoses:  Principal Problem:   Acute on chronic heart failure with preserved ejection fraction (HFpEF) (HCC) Active Problems:   ESRD on dialysis (HCC)   Hypertension associated with diabetes (HCC)   Nonischemic cardiomyopathy (HCC)   Hyperlipidemia associated with type 2 diabetes mellitus (HCC)   Elevated troponin   Type 2 diabetes mellitus with chronic kidney disease on chronic dialysis, with long-term current use of insulin  (HCC)   Anemia of chronic disease  Discharge Exam: Vitals:   11/09/23 1100 11/09/23 1130  BP: (!) 137/94   Pulse: (!) 38 98  Resp: 14 11  Temp:    SpO2: 96% 92%    Vitals:   11/09/23 1000 11/09/23 1030 11/09/23 1100 11/09/23 1130  BP: (!) 150/84 (!) 156/109 (!) 137/94   Pulse: 82 82 (!) 38 98  Resp: 16 14 14 11   Temp:      TempSrc:      SpO2: 96% 93% 96% 92%  Weight:      Height:          Discharge Instructions   Allergies as of 11/09/2023       Reactions   Aspirin  Hives   Nitroglycerin  Other (See Comments)   Nitroglycerin  paste or IV will make blood pressure go up and cause headaches. The nitroglycerin  tablets do fine.        Medication List     STOP taking these medications    doxycycline  100 MG capsule Commonly known as: VIBRAMYCIN        TAKE these medications    acetaminophen  500 MG tablet Commonly known as: TYLENOL  Take 500-1,000 mg by mouth every 6 (six) hours as needed (pain.).   albuterol  (2.5 MG/3ML) 0.083% nebulizer solution Commonly known as: PROVENTIL  Take 3 mLs (2.5 mg total) by nebulization every 6 (six) hours as needed for wheezing or shortness of breath.   albuterol  (2.5 MG/3ML) 0.083% nebulizer solution Commonly known as: PROVENTIL  Take 3 mLs (2.5 mg total) by nebulization every 6 (six) hours as needed for wheezing or shortness of breath.   amLODipine  10 MG tablet Commonly known as: NORVASC  Take 1 tablet (10 mg total) by mouth daily.   aspirin  EC 81 MG tablet Take one tablet by mouth twice daily for 30 days after sugery for blood clot prevention. Start post op day 1. Swallow whole.   atorvastatin  80 MG tablet Commonly known as: LIPITOR Take 1 tablet (80 mg total) by mouth daily.   Basaglar  KwikPen 100 UNIT/ML Inject 20 Units into the skin daily. What changed:  how much to take when to take this   calcium  acetate 667 MG capsule Commonly known as: PHOSLO  Take 2 capsules (1,334 mg total) by mouth 3 (three) times daily with meals   carvedilol  25 MG tablet Commonly known as: COREG  Take 1 tablet (25 mg total) by mouth 2 (two) times daily with a meal.   doxazosin  4 MG tablet Commonly  known as: CARDURA  Take 12 mg by mouth at bedtime.   doxazosin  8 MG tablet Commonly known as: CARDURA  Take 1 tablet (8 mg total) by mouth daily.   hydrALAZINE  100 MG tablet Commonly known as: APRESOLINE  Take 1 tablet (100 mg total) by mouth 3 (three) times daily.   insulin  lispro 100 UNIT/ML injection Commonly known as: HUMALOG  Inject 0-4 Units into the skin 3 (three) times daily as needed for high blood sugar.   Insulin  Pen Needle 31G X 6 MM Misc Use as directed in the morning, at noon, in the evening, and at bedtime.   nicotine  14 mg/24hr patch Commonly known as: NICODERM CQ  - dosed in mg/24 hours Place 1 patch (14 mg total) onto the skin daily as needed.   nitroGLYCERIN  0.4 MG SL tablet Commonly known as: NITROSTAT  Place 1 tablet (0.4 mg total) under the tongue every 5 (five) minutes as needed for up to 30 doses for chest pain.   oxyCODONE  5 MG immediate release tablet Commonly known as: Roxicodone  Take 1 tablet (  5 mg total) by mouth every 4 (four) hours as needed (for post op pain).   sertraline  25 MG tablet Commonly known as: ZOLOFT  Take 1 tablet (25 mg total) by mouth daily.   Symbicort  160-4.5 MCG/ACT inhaler Generic drug: budesonide -formoterol  Inhale 2 puffs into the lungs 2 (two) times daily.   TUMS PO Take 2 tablets by mouth 3 (three) times daily with meals.   Victoza  18 MG/3ML Sopn Generic drug: liraglutide  Inject 1.8 mg into the skin daily.        Allergies  Allergen Reactions   Aspirin  Hives   Nitroglycerin  Other (See Comments)    Nitroglycerin  paste or IV will make blood pressure go up and cause headaches. The nitroglycerin  tablets do fine.    You were cared for by a hospitalist during your hospital stay. If you have any questions about your discharge medications or the care you received while you were in the hospital after you are discharged, you can call the unit and asked to speak with the hospitalist on call if the hospitalist that took care of  you is not available. Once you are discharged, your primary care physician will handle any further medical issues. Please note that no refills for any discharge medications will be authorized once you are discharged, as it is imperative that you return to your primary care physician (or establish a relationship with a primary care physician if you do not have one) for your aftercare needs so that they can reassess your need for medications and monitor your lab values.  You were cared for by a hospitalist during your hospital stay. If you have any questions about your discharge medications or the care you received while you were in the hospital after you are discharged, you can call the unit and asked to speak with the hospitalist on call if the hospitalist that took care of you is not available. Once you are discharged, your primary care physician will handle any further medical issues. Please note that NO REFILLS for any discharge medications will be authorized once you are discharged, as it is imperative that you return to your primary care physician (or establish a relationship with a primary care physician if you do not have one) for your aftercare needs so that they can reassess your need for medications and monitor your lab values.  Please request your Prim.MD to go over all Hospital Tests and Procedure/Radiological results at the follow up, please get all Hospital records sent to your Prim MD by signing hospital release before you go home.  Get CBC, CMP, 2 view Chest X ray checked  by Primary MD during your next visit or SNF MD in 5-7 days ( we routinely change or add medications that can affect your baseline labs and fluid status, therefore we recommend that you get the mentioned basic workup next visit with your PCP, your PCP may decide not to get them or add new tests based on their clinical decision)  On your next visit with your primary care physician please Get Medicines reviewed and  adjusted.  If you experience worsening of your admission symptoms, develop shortness of breath, life threatening emergency, suicidal or homicidal thoughts you must seek medical attention immediately by calling 911 or calling your MD immediately  if symptoms less severe.  You Must read complete instructions/literature along with all the possible adverse reactions/side effects for all the Medicines you take and that have been prescribed to you. Take any new Medicines after you have completely  understood and accpet all the possible adverse reactions/side effects.   Do not drive, operate heavy machinery, perform activities at heights, swimming or participation in water  activities or provide baby sitting services if your were admitted for syncope or siezures until you have seen by Primary MD or a Neurologist and advised to do so again.  Do not drive when taking Pain medications.   Procedures/Studies: DG Chest Port 1 View Result Date: 11/08/2023 CLINICAL DATA:  shob EXAM: PORTABLE CHEST 1 VIEW COMPARISON:  Chest x-ray 06/29/2023, CT chest 08/03/2021 FINDINGS: The heart and mediastinal contours are unchanged. Persistent enlarged cardiac silhouette. No focal consolidation. Pulmonary edema. No pleural effusion. No pneumothorax. No acute osseous abnormality. IMPRESSION: Pulmonary edema with persistent cardiomegaly. Underlying pericardial effusion not excluded. Electronically Signed   By: Morgane  Naveau M.D.   On: 11/08/2023 20:04     The results of significant diagnostics from this hospitalization (including imaging, microbiology, ancillary and laboratory) are listed below for reference.     Microbiology: Recent Results (from the past 240 hours)  Resp panel by RT-PCR (RSV, Flu A&B, Covid) Anterior Nasal Swab     Status: None   Collection Time: 11/08/23  8:25 PM   Specimen: Anterior Nasal Swab  Result Value Ref Range Status   SARS Coronavirus 2 by RT PCR NEGATIVE NEGATIVE Final    Comment:  (NOTE) SARS-CoV-2 target nucleic acids are NOT DETECTED.  The SARS-CoV-2 RNA is generally detectable in upper respiratory specimens during the acute phase of infection. The lowest concentration of SARS-CoV-2 viral copies this assay can detect is 138 copies/mL. A negative result does not preclude SARS-Cov-2 infection and should not be used as the sole basis for treatment or other patient management decisions. A negative result may occur with  improper specimen collection/handling, submission of specimen other than nasopharyngeal swab, presence of viral mutation(s) within the areas targeted by this assay, and inadequate number of viral copies(<138 copies/mL). A negative result must be combined with clinical observations, patient history, and epidemiological information. The expected result is Negative.  Fact Sheet for Patients:  BloggerCourse.com  Fact Sheet for Healthcare Providers:  SeriousBroker.it  This test is no t yet approved or cleared by the [place]  FDA and  has been authorized for detection and/or diagnosis of SARS-CoV-2 by FDA under an Emergency Use Authorization (EUA). This EUA will remain  in effect (meaning this test can be used) for the duration of the COVID-19 declaration under Section 564(b)(1) of the Act, 21 U.S.C.section 360bbb-3(b)(1), unless the authorization is terminated  or revoked sooner.       Influenza A by PCR NEGATIVE NEGATIVE Final   Influenza B by PCR NEGATIVE NEGATIVE Final    Comment: (NOTE) The Xpert Xpress SARS-CoV-2/FLU/RSV plus assay is intended as an aid in the diagnosis of influenza from Nasopharyngeal swab specimens and should not be used as a sole basis for treatment. Nasal washings and aspirates are unacceptable for Xpert Xpress SARS-CoV-2/FLU/RSV testing.  Fact Sheet for Patients: BloggerCourse.com  Fact Sheet for Healthcare  Providers: SeriousBroker.it  This test is not yet approved or cleared by the [place]  FDA and has been authorized for detection and/or diagnosis of SARS-CoV-2 by FDA under an Emergency Use Authorization (EUA). This EUA will remain in effect (meaning this test can be used) for the duration of the COVID-19 declaration under Section 564(b)(1) of the Act, 21 U.S.C. section 360bbb-3(b)(1), unless the authorization is terminated or revoked.     Resp Syncytial Virus by PCR NEGATIVE NEGATIVE Final  Comment: (NOTE) Fact Sheet for Patients: BloggerCourse.com  Fact Sheet for Healthcare Providers: SeriousBroker.it  This test is not yet approved or cleared by the [place]  FDA and has been authorized for detection and/or diagnosis of SARS-CoV-2 by FDA under an Emergency Use Authorization (EUA). This EUA will remain in effect (meaning this test can be used) for the duration of the COVID-19 declaration under Section 564(b)(1) of the Act, 21 U.S.C. section 360bbb-3(b)(1), unless the authorization is terminated or revoked.  Performed at [facility], [address], [place], [place] [address]   MRSA Next Gen by PCR, Nasal     Status: None   Collection Time: 11/09/23  4:21 AM   Specimen: Nasal Mucosa; Nasal Swab  Result Value Ref Range Status   MRSA by PCR Next Gen NOT DETECTED NOT DETECTED Final    Comment: (NOTE) The GeneXpert MRSA Assay (FDA approved for NASAL specimens only), is one component of a comprehensive MRSA colonization surveillance program. It is not intended to diagnose MRSA infection nor to guide or monitor treatment for MRSA infections. Test performance is not FDA approved in patients less than 67 years old. Performed at [facility], [address], [place], [place] [address]      Labs: BNP (last 3 results) Recent Labs    06/29/23 0811  BNP 974.3*    Basic Metabolic Panel: Recent Labs  Lab 11/08/23 1947 11/08/23 1950 11/09/23 0437  NA 140 139 138  K 4.0 3.8 3.8  CL 100  --  102  CO2 20*  --  20*  GLUCOSE 163*  --  152*  BUN 84*  --  90*  CREATININE 13.50*  --  13.47*  CALCIUM  8.5*  --  8.1*   Liver Function Tests: Recent Labs  Lab 11/08/23 1947  AST 15  ALT 11  ALKPHOS 69  BILITOT 0.2  PROT 7.7  ALBUMIN 4.2   No results for input(s): LIPASE, AMYLASE in the last 168 hours. No results for input(s): AMMONIA in the last 168 hours. CBC: Recent Labs  Lab 11/08/23 1947 11/08/23 1950 11/09/23 0437  WBC 10.1  --  9.8  NEUTROABS 6.8  --   --   HGB 9.8* 10.5* 9.7*  HCT 31.2* 31.0* 31.0*  MCV 86.7  --  86.4  PLT 238  --  237   Cardiac Enzymes: No results for input(s): CKTOTAL, CKMB, CKMBINDEX, TROPONINI in the last 168 hours. BNP: Invalid input(s): POCBNP CBG: Recent Labs  Lab 11/09/23 0101 11/09/23 0803  GLUCAP 148* 134*   D-Dimer No results for input(s): DDIMER in the last 72 hours. Hgb A1c No results for input(s): HGBA1C in the last 72 hours. Lipid Profile No results for input(s): CHOL, HDL, LDLCALC, TRIG, CHOLHDL, LDLDIRECT in the last 72 hours. Thyroid  function studies No results for input(s): TSH, T4TOTAL, T3FREE, THYROIDAB in the last 72 hours.  Invalid input(s): FREET3 Anemia work up No results for input(s): VITAMINB12, FOLATE, FERRITIN, TIBC, IRON, RETICCTPCT in the last 72 hours. Urinalysis    Component Value Date/Time   COLORURINE YELLOW 05/07/2021 1417   APPEARANCEUR CLEAR 05/07/2021 1417   LABSPEC 1.014 05/07/2021 1417   PHURINE 6.5 05/07/2021 1417   GLUCOSEU >1,000 (A) 05/07/2021 1417   HGBUR MODERATE (A) 05/07/2021 1417   BILIRUBINUR NEGATIVE 05/07/2021 1417   BILIRUBINUR negative 11/07/2020 1717   KETONESUR NEGATIVE 05/07/2021 1417   PROTEINUR >300 (A) 05/07/2021 1417   UROBILINOGEN 0.2 11/07/2020 1717   NITRITE NEGATIVE  05/07/2021 1417  LEUKOCYTESUR NEGATIVE 05/07/2021 1417   Sepsis Labs Recent Labs  Lab 11/08/23 1947 11/09/23 0437  WBC 10.1 9.8   Microbiology Recent Results (from the past 240 hours)  Resp panel by RT-PCR (RSV, Flu A&B, Covid) Anterior Nasal Swab     Status: None   Collection Time: 11/08/23  8:25 PM   Specimen: Anterior Nasal Swab  Result Value Ref Range Status   SARS Coronavirus 2 by RT PCR NEGATIVE NEGATIVE Final    Comment: (NOTE) SARS-CoV-2 target nucleic acids are NOT DETECTED.  The SARS-CoV-2 RNA is generally detectable in upper respiratory specimens during the acute phase of infection. The lowest concentration of SARS-CoV-2 viral copies this assay can detect is 138 copies/mL. A negative result does not preclude SARS-Cov-2 infection and should not be used as the sole basis for treatment or other patient management decisions. A negative result may occur with  improper specimen collection/handling, submission of specimen other than nasopharyngeal swab, presence of viral mutation(s) within the areas targeted by this assay, and inadequate number of viral copies(<138 copies/mL). A negative result must be combined with clinical observations, patient history, and epidemiological information. The expected result is Negative.  Fact Sheet for Patients:  BloggerCourse.com  Fact Sheet for Healthcare Providers:  SeriousBroker.it  This test is no t yet approved or cleared by the [place]  FDA and  has been authorized for detection and/or diagnosis of SARS-CoV-2 by FDA under an Emergency Use Authorization (EUA). This EUA will remain  in effect (meaning this test can be used) for the duration of the COVID-19 declaration under Section 564(b)(1) of the Act, 21 U.S.C.section 360bbb-3(b)(1), unless the authorization is terminated  or revoked sooner.       Influenza A by PCR NEGATIVE NEGATIVE Final   Influenza B by PCR NEGATIVE  NEGATIVE Final    Comment: (NOTE) The Xpert Xpress SARS-CoV-2/FLU/RSV plus assay is intended as an aid in the diagnosis of influenza from Nasopharyngeal swab specimens and should not be used as a sole basis for treatment. Nasal washings and aspirates are unacceptable for Xpert Xpress SARS-CoV-2/FLU/RSV testing.  Fact Sheet for Patients: BloggerCourse.com  Fact Sheet for Healthcare Providers: SeriousBroker.it  This test is not yet approved or cleared by the [place]  FDA and has been authorized for detection and/or diagnosis of SARS-CoV-2 by FDA under an Emergency Use Authorization (EUA). This EUA will remain in effect (meaning this test can be used) for the duration of the COVID-19 declaration under Section 564(b)(1) of the Act, 21 U.S.C. section 360bbb-3(b)(1), unless the authorization is terminated or revoked.     Resp Syncytial Virus by PCR NEGATIVE NEGATIVE Final    Comment: (NOTE) Fact Sheet for Patients: BloggerCourse.com  Fact Sheet for Healthcare Providers: SeriousBroker.it  This test is not yet approved or cleared by the [place]  FDA and has been authorized for detection and/or diagnosis of SARS-CoV-2 by FDA under an Emergency Use Authorization (EUA). This EUA will remain in effect (meaning this test can be used) for the duration of the COVID-19 declaration under Section 564(b)(1) of the Act, 21 U.S.C. section 360bbb-3(b)(1), unless the authorization is terminated or revoked.  Performed at [facility], [address], [place], [place] [address]   MRSA Next Gen by PCR, Nasal     Status: None   Collection Time: 11/09/23  4:21 AM   Specimen: Nasal Mucosa; Nasal Swab  Result Value Ref Range Status   MRSA by PCR Next Gen NOT DETECTED NOT DETECTED Final    Comment: (NOTE)  The GeneXpert MRSA Assay (FDA approved for NASAL specimens only), is  one component of a comprehensive MRSA colonization surveillance program. It is not intended to diagnose MRSA infection nor to guide or monitor treatment for MRSA infections. Test performance is not FDA approved in patients less than 27 years old. Performed at [facility], [address], [place], [place] [address]      Time coordinating discharge:  I have spent 35 minutes face to face with the patient and on the ward discussing the patients care, assessment, plan and disposition with other care givers. >50% of the time was devoted counseling the patient about the risks and benefits of treatment/Discharge disposition and coordinating care.   SIGNED:   Burgess JAYSON Dare, MD  [facility] 11/09/2023, 11:37 AM   If 7PM-7AM, please contact night-coverage

## 2023-11-09 NOTE — Progress Notes (Signed)
 PROGRESS NOTE    Matthew Odom  FMW:[id number] DOB: 1970/12/22 DOA: 11/08/2023 PCP: Lorren Greig PARAS, NP    Brief Narrative:  53 year old with history of ESRD on TTS HD, congestive heart failure with reduced EF, COPD, DM 2, HTN, HLD, anemia of chronic disease, BPH, OSA not on CPAP comes to the hospital with shortness of breath after missing dialysis.  Chest x-ray showed pulmonary edema and persistent cardiomegaly.  EDP consulted nephrology.   Assessment & Plan:  Principal Problem:   Acute on chronic heart failure with preserved ejection fraction (HFpEF) (HCC) Active Problems:   ESRD on dialysis (HCC)   Hypertension associated with diabetes (HCC)   Nonischemic cardiomyopathy (HCC)   Hyperlipidemia associated with type 2 diabetes mellitus (HCC)   Elevated troponin   Type 2 diabetes mellitus with chronic kidney disease on chronic dialysis, with long-term current use of insulin  (HCC)   Anemia of chronic disease    Pulmonary edema/acute on chronic HFpEF: ESRD on TTS HD: Volume overload secondary to missing  dialysis per nephrology - He is scheduled for AV shunt intervention with Dr. Magda on 7/7.  Discussed with vascular PA, will reschedule his appointment   NICM/elevated troponin: Troponin T elevated 215 > 217.  Demand ischemia in the setting of volume overload   COPD: No wheezing or sign of COPD exacerbation on admission.  Continue Breo Ellipta  and albuterol  as needed.   Hypertension: Initially hypertensive to 206/109 on arrival to ED. continue home medicine, should improve with HD Norvasc , Coreg , hydralazine    Type 2 diabetes: Sliding scale and Accu-Cheks   Anemia of chronic disease: Hemoglobin stable.  Continue monitor.   Hyperlipidemia: Unclear if patient still taking statin.   BPH: Continue doxazosin .   DVT prophylaxis: heparin  injection 5,000 Units Start: 11/09/23 0600 Code Status: Full code Family Communication: Spouse at bedside Disposition Plan: Discharge  once cleared by nephrology  Subjective:  Seen at bedside during his hemodialysis. No complaints. Wants to sit out in the chair during HD. I told to ask his HD RN as I m not aware of protocols.   Examination:  General exam: Appears calm and comfortable  Respiratory system: Clear to auscultation. Respiratory effort normal. Cardiovascular system: S1 & S2 heard, RRR. No JVD, murmurs, rubs, gallops or clicks. No pedal edema. Gastrointestinal system: Abdomen is nondistended, soft and nontender. No organomegaly or masses felt. Normal bowel sounds heard. Central nervous system: Alert and oriented. No focal neurological deficits. Extremities: Symmetric 5 x 5 power. Skin: No rashes, lesions or ulcers Psychiatry: Judgement and insight appear normal. Mood & affect appropriate.                Diet Orders (From admission, onward)     Start     Ordered   11/08/23 2358  Diet renal/carb modified with fluid restriction Fluid consistency: Thin  Diet effective now       Question:  Fluid consistency:  Answer:  Thin   11/08/23 2357            Objective: Vitals:   11/09/23 0930 11/09/23 1000 11/09/23 1030 11/09/23 1100  BP: (!) 163/84 (!) 150/84 (!) 156/109 (!) 137/94  Pulse: 85 82 82 (!) 38  Resp: 15 16 14 14   Temp:      TempSrc:      SpO2: 94% 96% 93% 96%  Weight:      Height:        Intake/Output Summary (Last 24 hours) at 11/09/2023 1104 Last data filed at 11/09/2023  0800 Gross per 24 hour  Intake 363 ml  Output 400 ml  Net -37 ml   Filed Weights   11/09/23 0056 11/09/23 0500 11/09/23 0836  Weight: 114.8 kg 114.8 kg 114.4 kg    Scheduled Meds:  amLODipine   10 mg Oral Daily   carvedilol   25 mg Oral BID WC   Chlorhexidine  Gluconate Cloth  6 each Topical Q0600   doxazosin   8 mg Oral Daily   fluticasone  furoate-vilanterol  1 puff Inhalation Daily   heparin   5,000 Units Subcutaneous Q8H   hydrALAZINE   100 mg Oral TID   insulin  aspart  0-6 Units Subcutaneous TID WC    sodium chloride  flush  3 mL Intravenous Q12H   Continuous Infusions:  Nutritional status     Body mass index is 35.18 kg/m.  Data Reviewed:   CBC: Recent Labs  Lab 11/08/23 1947 11/08/23 1950 11/09/23 0437  WBC 10.1  --  9.8  NEUTROABS 6.8  --   --   HGB 9.8* 10.5* 9.7*  HCT 31.2* 31.0* 31.0*  MCV 86.7  --  86.4  PLT 238  --  237   Basic Metabolic Panel: Recent Labs  Lab 11/08/23 1947 11/08/23 1950 11/09/23 0437  NA 140 139 138  K 4.0 3.8 3.8  CL 100  --  102  CO2 20*  --  20*  GLUCOSE 163*  --  152*  BUN 84*  --  90*  CREATININE 13.50*  --  13.47*  CALCIUM  8.5*  --  8.1*   GFR: Estimated Creatinine Clearance: 8.2 mL/min (A) (by C-G formula based on SCr of 13.47 mg/dL (H)). Liver Function Tests: Recent Labs  Lab 11/08/23 1947  AST 15  ALT 11  ALKPHOS 69  BILITOT 0.2  PROT 7.7  ALBUMIN 4.2   No results for input(s): LIPASE, AMYLASE in the last 168 hours. No results for input(s): AMMONIA in the last 168 hours. Coagulation Profile: No results for input(s): INR, PROTIME in the last 168 hours. Cardiac Enzymes: No results for input(s): CKTOTAL, CKMB, CKMBINDEX, TROPONINI in the last 168 hours. BNP (last 3 results) Recent Labs    11/08/23 1947  PROBNP 13,877.0*   HbA1C: No results for input(s): HGBA1C in the last 72 hours. CBG: Recent Labs  Lab 11/09/23 0101 11/09/23 0803  GLUCAP 148* 134*   Lipid Profile: No results for input(s): CHOL, HDL, LDLCALC, TRIG, CHOLHDL, LDLDIRECT in the last 72 hours. Thyroid  Function Tests: No results for input(s): TSH, T4TOTAL, FREET4, T3FREE, THYROIDAB in the last 72 hours. Anemia Panel: No results for input(s): VITAMINB12, FOLATE, FERRITIN, TIBC, IRON, RETICCTPCT in the last 72 hours. Sepsis Labs: No results for input(s): PROCALCITON, LATICACIDVEN in the last 168 hours.  Recent Results (from the past 240 hours)  Resp panel by RT-PCR (RSV, Flu A&B,  Covid) Anterior Nasal Swab     Status: None   Collection Time: 11/08/23  8:25 PM   Specimen: Anterior Nasal Swab  Result Value Ref Range Status   SARS Coronavirus 2 by RT PCR NEGATIVE NEGATIVE Final    Comment: (NOTE) SARS-CoV-2 target nucleic acids are NOT DETECTED.  The SARS-CoV-2 RNA is generally detectable in upper respiratory specimens during the acute phase of infection. The lowest concentration of SARS-CoV-2 viral copies this assay can detect is 138 copies/mL. A negative result does not preclude SARS-Cov-2 infection and should not be used as the sole basis for treatment or other patient management decisions. A negative result may occur with  improper specimen collection/handling,  submission of specimen other than nasopharyngeal swab, presence of viral mutation(s) within the areas targeted by this assay, and inadequate number of viral copies(<138 copies/mL). A negative result must be combined with clinical observations, patient history, and epidemiological information. The expected result is Negative.  Fact Sheet for Patients:  BloggerCourse.com  Fact Sheet for Healthcare Providers:  SeriousBroker.it  This test is no t yet approved or cleared by the [place]  FDA and  has been authorized for detection and/or diagnosis of SARS-CoV-2 by FDA under an Emergency Use Authorization (EUA). This EUA will remain  in effect (meaning this test can be used) for the duration of the COVID-19 declaration under Section 564(b)(1) of the Act, 21 U.S.C.section 360bbb-3(b)(1), unless the authorization is terminated  or revoked sooner.       Influenza A by PCR NEGATIVE NEGATIVE Final   Influenza B by PCR NEGATIVE NEGATIVE Final    Comment: (NOTE) The Xpert Xpress SARS-CoV-2/FLU/RSV plus assay is intended as an aid in the diagnosis of influenza from Nasopharyngeal swab specimens and should not be used as a sole basis for treatment. Nasal  washings and aspirates are unacceptable for Xpert Xpress SARS-CoV-2/FLU/RSV testing.  Fact Sheet for Patients: BloggerCourse.com  Fact Sheet for Healthcare Providers: SeriousBroker.it  This test is not yet approved or cleared by the [place]  FDA and has been authorized for detection and/or diagnosis of SARS-CoV-2 by FDA under an Emergency Use Authorization (EUA). This EUA will remain in effect (meaning this test can be used) for the duration of the COVID-19 declaration under Section 564(b)(1) of the Act, 21 U.S.C. section 360bbb-3(b)(1), unless the authorization is terminated or revoked.     Resp Syncytial Virus by PCR NEGATIVE NEGATIVE Final    Comment: (NOTE) Fact Sheet for Patients: BloggerCourse.com  Fact Sheet for Healthcare Providers: SeriousBroker.it  This test is not yet approved or cleared by the [place]  FDA and has been authorized for detection and/or diagnosis of SARS-CoV-2 by FDA under an Emergency Use Authorization (EUA). This EUA will remain in effect (meaning this test can be used) for the duration of the COVID-19 declaration under Section 564(b)(1) of the Act, 21 U.S.C. section 360bbb-3(b)(1), unless the authorization is terminated or revoked.  Performed at [facility], [address], [place], [place] [address]   MRSA Next Gen by PCR, Nasal     Status: None   Collection Time: 11/09/23  4:21 AM   Specimen: Nasal Mucosa; Nasal Swab  Result Value Ref Range Status   MRSA by PCR Next Gen NOT DETECTED NOT DETECTED Final    Comment: (NOTE) The GeneXpert MRSA Assay (FDA approved for NASAL specimens only), is one component of a comprehensive MRSA colonization surveillance program. It is not intended to diagnose MRSA infection nor to guide or monitor treatment for MRSA infections. Test performance is not FDA approved in patients less  than 25 years old. Performed at [facility], [address] [place], [place] [address]          Radiology Studies: DG Chest Port 1 View Result Date: 11/08/2023 CLINICAL DATA:  shob EXAM: PORTABLE CHEST 1 VIEW COMPARISON:  Chest x-ray 06/29/2023, CT chest 08/03/2021 FINDINGS: The heart and mediastinal contours are unchanged. Persistent enlarged cardiac silhouette. No focal consolidation. Pulmonary edema. No pleural effusion. No pneumothorax. No acute osseous abnormality. IMPRESSION: Pulmonary edema with persistent cardiomegaly. Underlying pericardial effusion not excluded. Electronically Signed   By: Morgane  Naveau M.D.   On: 11/08/2023 20:04  LOS: 0 days   Time spent= 35 mins    Matthew JAYSON Dare, MD [facility]  If 7PM-7AM, please contact night-coverage  11/09/2023, 11:04 AM

## 2023-11-09 NOTE — Plan of Care (Signed)

## 2023-11-09 NOTE — Consult Note (Signed)
 Renal Service Consult Note [facility]  Matthew Odom 11/09/2023 Matthew JONETTA [name], MD Requesting Physician: Dr. Caleen  Reason for Consult: ESRD pt w/ SOB, pulm edema HPI: The patient is a 54 y.o. year-old w/ PMH as below who presented  To ED yesterday evening c/o SOB x 12 hrs, taking breathing rx's at home w/o improvement. Last HD Thursday, skipped Sat session. In ED CXR showed pulm edema R > L, BP 206/97, HR 100, RR 15-23. Pt was admitted. HD was ordered for early this am. We are asked to see for ESRD.    Pt seen in HD unit. States he is feeling a lot better after 2.5 hrs of dialysis. States he has been eating a lot of ice.  And skips Sat HD not infrequently. Has never had this breathing problem though before.    ROS - denies CP, no joint pain, no HA, no blurry vision, no rash, no diarrhea, no nausea/ vomiting   Past Medical History  Past Medical History:  Diagnosis Date   Anxiety    BPH (benign prostatic hyperplasia)    Cocaine use    07/15/21- last time was more than 10 years ago.   Complication of anesthesia    wakes up during surgery   COPD (chronic obstructive pulmonary disease) (HCC)    Coronary artery disease 2021   mild non obstructed   Diabetes mellitus without complication (HCC)    Enlarged heart    ESRD (end stage renal disease) (HCC)    TTHSAT   History of degenerative disc disease    Hyperlipidemia    Hypertension    NSTEMI (non-ST elevated myocardial infarction) (HCC)    Pneumonia    Sciatic nerve pain    Sleep apnea    Stroke (HCC)    no residual, x 3 last one was in 2020   Thoracic ascending aortic aneurysm (HCC)    4.2 cm 08/03/21 CTA chest   Past Surgical History  Past Surgical History:  Procedure Laterality Date   ANKLE ARTHROSCOPY Left 06/18/2023   Procedure: LEFT ANKLE ARTHROSCOPIC DEBRIDEMENT;  Surgeon: Elsa Lonni SAUNDERS, MD;  Location: [facility];  Service: Orthopedics;  Laterality: Left;   AV FISTULA PLACEMENT Left 07/17/2021    Procedure: CREATION  OF LEFT ARM RADIOCEPHALIC ARTERIOVENOUS (AV) FISTULA;  Surgeon: Serene Gaile ORN, MD;  Location: [facility] OR;  Service: Vascular;  Laterality: Left;   CARDIAC CATHETERIZATION     IR FLUORO GUIDE CV LINE RIGHT  05/20/2021   IR US  GUIDE VASC ACCESS RIGHT  05/20/2021   LIGATION OF COMPETING BRANCHES OF ARTERIOVENOUS FISTULA Left 09/13/2021   Procedure: LIGATION AND ELEVATION OF COMPETING BRANCHES OF LEFT ARM ARTERIOVENOUS FISTULA;  Surgeon: Serene Gaile ORN, MD;  Location: [facility] OR;  Service: Vascular;  Laterality: Left;   ORIF ANKLE FRACTURE Left 06/18/2023   Procedure: OPEN TREATMENT LATERAL MALLEOLUS;  Surgeon: Elsa Lonni SAUNDERS, MD;  Location: [facility];  Service: Orthopedics;  Laterality: Left;   SYNDESMOSIS REPAIR Left 06/18/2023   Procedure: SYNDESMOSIS REPAIR;  Surgeon: Elsa Lonni SAUNDERS, MD;  Location: [facility];  Service: Orthopedics;  Laterality: Left;   Family History  Family History  Problem Relation Age of Onset   Heart attack Mother    Hypertension Mother    Diabetes Mother    Heart disease Sister    Hypertension Sister    Hypertension Father    Emphysema Father    Social History  reports that he has been smoking cigarettes. He has a 20  pack-year smoking history. He has never used smokeless tobacco. He reports that he does not currently use alcohol. He reports that he does not currently use drugs. Allergies  Allergies  Allergen Reactions   Aspirin  Hives   Nitroglycerin  Other (See Comments)    Nitroglycerin  paste or IV will make blood pressure go up and cause headaches. The nitroglycerin  tablets do fine.   Home medications Prior to Admission medications   Medication Sig Start Date End Date Taking? Authorizing Provider  acetaminophen  (TYLENOL ) 500 MG tablet Take 500-1,000 mg by mouth every 6 (six) hours as needed (pain.).    [provider]  albuterol  (PROVENTIL ) (2.5 MG/3ML) 0.083% nebulizer solution Take 3 mLs (2.5 mg total) by nebulization every 6  (six) hours as needed for wheezing or shortness of breath. 01/14/23   Lorren Greig PARAS, NP  albuterol  (PROVENTIL ) (2.5 MG/3ML) 0.083% nebulizer solution Take 3 mLs (2.5 mg total) by nebulization every 6 (six) hours as needed for wheezing or shortness of breath. 05/25/23   Laurice Maude BROCKS, MD  amLODipine  (NORVASC ) 10 MG tablet Take 1 tablet (10 mg total) by mouth daily. 12/05/22   O'NealDarryle Ned, MD  aspirin  EC 81 MG tablet Take one tablet by mouth twice daily for 30 days after sugery for blood clot prevention. Start post op day 1. Swallow whole. 06/18/23   [place], Jesse J, PA-C  atorvastatin  (LIPITOR) 80 MG tablet Take 1 tablet (80 mg total) by mouth daily. Patient not taking: Reported on 06/15/2023 11/13/21 01/14/23  Akula, Vijaya, MD  budesonide -formoterol  (SYMBICORT ) 160-4.5 MCG/ACT inhaler Inhale 2 puffs into the lungs 2 (two) times daily. 01/14/23   Lorren Greig PARAS, NP  calcium  acetate (PHOSLO ) 667 MG capsule Take 2 capsules (1,334 mg total) by mouth 3 (three) times daily with meals 06/25/21     Calcium  Carbonate Antacid (TUMS PO) Take 2 tablets by mouth 3 (three) times daily with meals. Patient not taking: Reported on 09/11/2023    [provider]  carvedilol  (COREG ) 25 MG tablet Take 1 tablet (25 mg total) by mouth 2 (two) times daily with a meal. 12/05/22   O'Neal, Darryle Ned, MD  doxazosin  (CARDURA ) 4 MG tablet Take 12 mg by mouth at bedtime. Patient not taking: Reported on 09/11/2023    [provider]  doxazosin  (CARDURA ) 8 MG tablet Take 1 tablet (8 mg total) by mouth daily. 12/05/22   O'NealDarryle Ned, MD  doxycycline  (VIBRAMYCIN ) 100 MG capsule Take 1 capsule (100 mg total) by mouth 2 (two) times daily. 10/14/23   Yolande Matthew BROCKS, MD  hydrALAZINE  (APRESOLINE ) 100 MG tablet Take 1 tablet (100 mg total) by mouth 3 (three) times daily. 12/05/22   O'NealDarryle Ned, MD  Insulin  Glargine (BASAGLAR  KWIKPEN) 100 UNIT/ML Inject 20 Units into the skin daily. Patient taking  differently: Inject 10 Units into the skin 2 (two) times daily. 06/19/21   Shamleffer, Ibtehal Jaralla, MD  insulin  lispro (HUMALOG ) 100 UNIT/ML injection Inject 0-4 Units into the skin 3 (three) times daily as needed for high blood sugar.    [provider]  Insulin  Pen Needle 31G X 6 MM MISC Use as directed in the morning, at noon, in the evening, and at bedtime. 06/19/21   Shamleffer, Ibtehal Jaralla, MD  liraglutide  (VICTOZA ) 18 MG/3ML SOPN Inject 1.8 mg into the skin daily. 06/19/21   Shamleffer, Ibtehal Jaralla, MD  nicotine  (NICODERM CQ  - DOSED IN MG/24 HOURS) 14 mg/24hr patch Place 1 patch (14 mg total) onto the skin  daily as needed. Patient not taking: Reported on 09/11/2023 01/14/23   Lorren Greig PARAS, NP  nitroGLYCERIN  (NITROSTAT ) 0.4 MG SL tablet Place 1 tablet (0.4 mg total) under the tongue every 5 (five) minutes as needed for up to 30 doses for chest pain. Patient not taking: Reported on 09/11/2023 07/16/21   Cottie Donnice PARAS, MD  oxyCODONE  (ROXICODONE ) 5 MG immediate release tablet Take 1 tablet (5 mg total) by mouth every 4 (four) hours as needed (for post op pain). 10/14/23   Yolande Matthew BROCKS, MD  sertraline  (ZOLOFT ) 25 MG tablet Take 1 tablet (25 mg total) by mouth daily. Patient not taking: Reported on 09/11/2023 01/14/23   Lorren Greig PARAS, NP     Vitals:   11/09/23 1100 11/09/23 1130 11/09/23 1153 11/09/23 1200  BP: (!) 137/94 (!) 161/149 (!) 152/86 131/70  Pulse: (!) 38 98 69 92  Resp: 14 11 18 16   Temp:   97.7 F (36.5 C) 97.7 F (36.5 C)  TempSrc:      SpO2: 96% 92% (!) 89% 91%  Weight:    110.8 kg  Height:       Exam Gen alert, no distress, 2L [place] O2 No rash, cyanosis or gangrene Sclera anicteric, throat clear  No jvd or bruits Chest faint crackles at bases RRR no MRG Abd soft ntnd no mass or ascites +bs GU defer MS no joint effusions or deformity Ext no LE or UE edema, no other edema Neuro is alert, Ox 3 , nf     AVF+bruit      OP HD: TTS [facility]  4.5h     B500   109.3kg  2K bath   Heparin  5000 Last OP HD 7/3, post wt 109.5kg   Assessment/ Plan: SOB: pulm edema by CXR, missed HD Sat, eating lots of ice. 4 L UF this am, post wt today 110.8kg. Feels much better, ok for dc. Will get OP HD tomorrow. Will attempt to lower dry wt to 107.5kg tomorrow.   ESRD: on HD TTS. HD as above.  HTN: bp's stable here Volume: not grossly edematous, +pulm edema as above Anemia of esrd: Hb 9-11 here      Myer [name]  MD [facility] 11/09/2023, 12:22 PM  Recent Labs  Lab 11/08/23 1947 11/08/23 1950 11/09/23 0437  HGB 9.8* 10.5* 9.7*  ALBUMIN 4.2  --   --   CALCIUM  8.5*  --  8.1*  CREATININE 13.50*  --  13.47*  K 4.0 3.8 3.8   Inpatient medications:  amLODipine   10 mg Oral Daily   carvedilol   25 mg Oral BID WC   Chlorhexidine  Gluconate Cloth  6 each Topical Q0600   doxazosin   8 mg Oral Daily   fluticasone  furoate-vilanterol  1 puff Inhalation Daily   heparin   5,000 Units Subcutaneous Q8H   hydrALAZINE   100 mg Oral TID   insulin  aspart  0-6 Units Subcutaneous TID WC   sodium chloride  flush  3 mL Intravenous Q12H    acetaminophen  **OR** acetaminophen , guaiFENesin , hydrALAZINE , ipratropium-albuterol , melatonin, metoprolol  tartrate, ondansetron  **OR** ondansetron  (ZOFRAN ) IV, senna-docusate

## 2023-11-09 NOTE — Progress Notes (Signed)
 Patient c/o shortness of breath, patient placed on bipap.

## 2023-11-09 NOTE — Progress Notes (Signed)
 D/C order noted. Contacted [facility] on [address] to be advised of pt's d/c today and that pt should resume care tomorrow.   Randine Mungo Renal Navigator [phone]

## 2023-11-10 ENCOUNTER — Telehealth: Payer: Self-pay

## 2023-11-10 ENCOUNTER — Telehealth: Payer: Self-pay | Admitting: Nephrology

## 2023-11-10 DIAGNOSIS — Z992 Dependence on renal dialysis: Secondary | ICD-10-CM | POA: Diagnosis not present

## 2023-11-10 DIAGNOSIS — N186 End stage renal disease: Secondary | ICD-10-CM | POA: Diagnosis not present

## 2023-11-10 DIAGNOSIS — N2581 Secondary hyperparathyroidism of renal origin: Secondary | ICD-10-CM | POA: Diagnosis not present

## 2023-11-10 NOTE — Telephone Encounter (Signed)
 Transition of Care - Initial Contact after Hospitalization  Date of discharge:  11/09/23 Date of contact: 11/10/23  Method: Phone Spoke to: Patient  Patient contacted to discuss transition of care from recent inpatient hospitalization. Patient was admitted to [facility] from 7/6-11/09/23 with discharge diagnosis of pulmonary edema/acute on chronic HF   The discharge medication list was reviewed.  Patient will return to his/her outpatient HD unit on: He had dialysis today and will return on Thursday.   No other concerns at this time.

## 2023-11-10 NOTE — Telephone Encounter (Signed)
 Follow up from [facility] call:  Medications reviewed and he said he is missing the following: Phoslo , doxazosin  ( both strengths), nicotine  patches, nitroglycerin , oxycodone , sertraline  and symbicort  inhaler.    The medication list in Epic notes that he is not taking atorvastatin  but he told me he is taking it.   He has an appointment with Greig Drones, NP on 11/13/2023 and I instructed him to bring all of his meds with him to the appointment

## 2023-11-10 NOTE — Transitions of Care (Post Inpatient/ED Visit) (Signed)
 11/10/2023  Name: Matthew Odom MRN: [id number] DOB: March 09, 1971  Today's TOC FU Call Status: Today's TOC FU Call Status:: Successful TOC FU Call Completed TOC FU Call Complete Date: 11/10/23 Patient's Name and Date of Birth confirmed.  Transition Care Management Follow-up Telephone Call Date of Discharge: 11/09/23 Discharge Facility: [facility]) Type of Discharge: Inpatient Admission Primary Inpatient Discharge Diagnosis:: acute on chronic heart failure       returned to ED 7/7 with palpitations and then left AMA.  He said  they were making him wait too long in the ED How have you been since you were released from the hospital?: Better Any questions or concerns?: No  Items Reviewed: Did you receive and understand the discharge instructions provided?: Yes Medications obtained,verified, and reconciled?: Yes (Medications Reviewed) (He is missing multiple medications. I told him to bring all of his meds to his appointment with PCP on 11/13/2023. I also told him that I would let his PCP know what is missing  He does have a glucometer and a nebulier) Any new allergies since your discharge?: No Dietary orders reviewed?: Yes Type of Diet Ordered:: low sodium, heart healthy, - he also said he is on a 32 oz/day fluid restriction. Do you have support at home?: Yes People in Home [RPT]: child(ren), adult Name of Support/Comfort Primary Source: his daughters  Medications Reviewed Today: Medications Reviewed Today     Reviewed by Marvis Bradley, RN (Case Manager) on 11/10/23 at 1655  Med List Status: <None>   Medication Order Taking? Sig Documenting Provider Last Dose Status Informant  acetaminophen  (TYLENOL ) 500 MG tablet [id number]  Take 500-1,000 mg by mouth every 6 (six) hours as needed (pain.). [provider]  Active Spouse/Significant Other, Pharmacy Records  albuterol  (PROVENTIL ) (2.5 MG/3ML) 0.083% nebulizer solution [id number]  Take 3 mLs (2.5 mg total) by nebulization every 6  (six) hours as needed for wheezing or shortness of breath. Lorren Greig PARAS, NP  Active Spouse/Significant Other, Pharmacy Records  albuterol  (PROVENTIL ) (2.5 MG/3ML) 0.083% nebulizer solution [id number]  Take 3 mLs (2.5 mg total) by nebulization every 6 (six) hours as needed for wheezing or shortness of breath. Laurice Maude BROCKS, MD  Active Spouse/Significant Other, Pharmacy Records           Med Note (CRUTHIS, CHLOE C   Mon Nov 09, 2023  8:14 AM) Unable to remove duplicate entry without DC refills.   amLODipine  (NORVASC ) 10 MG tablet [id number]  Take 1 tablet (10 mg total) by mouth daily. Barbaraann Darryle Ned, MD  Active Spouse/Significant Other, Pharmacy Records  aspirin  EC 81 MG tablet [id number]  Take one tablet by mouth twice daily for 30 days after sugery for blood clot prevention. Start post op day 1. Swallow whole. [place], Jesse J, PA-C  Active   atorvastatin  (LIPITOR) 80 MG tablet [id number]  Take 1 tablet (80 mg total) by mouth daily.  Patient not taking: Reported on 06/15/2023   Akula, Vijaya, MD  Expired 01/14/23 2359   budesonide -formoterol  (SYMBICORT ) 160-4.5 MCG/ACT inhaler [id number]  Inhale 2 puffs into the lungs 2 (two) times daily. Lorren Greig PARAS, NP  Active Spouse/Significant Other, Pharmacy Records  calcium  acetate (PHOSLO ) 667 MG capsule [id number]  Take 2 capsules (1,334 mg total) by mouth 3 (three) times daily with meals   Active Spouse/Significant Other, Pharmacy Records           Med Note MYLO, HEATHER L   Tue Nov 12, 2021 10:18 AM)    Calcium  Carbonate Antacid (  TUMS PO) [id number]  Take 2 tablets by mouth 3 (three) times daily with meals.  Patient not taking: Reported on 09/11/2023   [provider]  Active Spouse/Significant Other, Pharmacy Records  carvedilol  (COREG ) 25 MG tablet [id number]  Take 1 tablet (25 mg total) by mouth 2 (two) times daily with a meal. O'Neal, Darryle Ned, MD  Active Spouse/Significant Other, Pharmacy Records  doxazosin  (CARDURA ) 4 MG tablet  [id number]  Take 12 mg by mouth at bedtime.  Patient not taking: Reported on 09/11/2023   [provider]  Active Spouse/Significant Other, Pharmacy Records  doxazosin  (CARDURA ) 8 MG tablet [id number]  Take 1 tablet (8 mg total) by mouth daily. Barbaraann Darryle Ned, MD  Active Spouse/Significant Other, Pharmacy Records  hydrALAZINE  (APRESOLINE ) 100 MG tablet [id number]  Take 1 tablet (100 mg total) by mouth 3 (three) times daily. Barbaraann Darryle Ned, MD  Active Spouse/Significant Other, Pharmacy Records  Insulin  Glargine (BASAGLAR  KWIKPEN) 100 UNIT/ML [id number]  Inject 20 Units into the skin daily.  Patient taking differently: Inject 10 Units into the skin 2 (two) times daily.   Shamleffer, Donell Cardinal, MD  Active Spouse/Significant Other, Pharmacy Records           Med Note MYLO, POWELL CROME   Tue Nov 12, 2021 10:18 AM)    insulin  lispro (HUMALOG ) 100 UNIT/ML injection [id number]  Inject 0-4 Units into the skin 3 (three) times daily as needed for high blood sugar. [provider]  Active Spouse/Significant Other, Pharmacy Records  Insulin  Pen Needle 31G X 6 MM MISC [id number]  Use as directed in the morning, at noon, in the evening, and at bedtime. Shamleffer, Donell Cardinal, MD  Active Spouse/Significant Other, Pharmacy Records  liraglutide  (VICTOZA ) 18 MG/3ML SOPN [id number]  Inject 1.8 mg into the skin daily. Shamleffer, Ibtehal Jaralla, MD  Active Spouse/Significant Other, Pharmacy Records  nicotine  (NICODERM CQ  - DOSED IN MG/24 HOURS) 14 mg/24hr patch [id number]  Place 1 patch (14 mg total) onto the skin daily as needed.  Patient not taking: Reported on 09/11/2023   Lorren Greig PARAS, NP  Active Spouse/Significant Other, Pharmacy Records  nitroGLYCERIN  (NITROSTAT ) 0.4 MG SL tablet [id number]  Place 1 tablet (0.4 mg total) under the tongue every 5 (five) minutes as needed for up to 30 doses for chest pain.  Patient not taking: Reported on 09/11/2023   Cottie Donnice PARAS, MD  Active  Spouse/Significant Other, Pharmacy Records           Med Note JACKOLYN WADDELL VEAR Pablo Jun 15, 2023  1:17 PM)    oxyCODONE  (ROXICODONE ) 5 MG immediate release tablet [id number]  Take 1 tablet (5 mg total) by mouth every 4 (four) hours as needed (for post op pain). Yolande Lamar BROCKS, MD  Active   sertraline  (ZOLOFT ) 25 MG tablet [id number]  Take 1 tablet (25 mg total) by mouth daily.  Patient not taking: Reported on 09/11/2023   Lorren Greig PARAS, NP  Active Spouse/Significant Other, Pharmacy Records  Med List Note Buena Vernell PARAS Sola 05/08/21 1246):              Home Care and Equipment/Supplies: Were Home Health Services Ordered?: No Any new equipment or medical supplies ordered?: No  Functional Questionnaire: Do you need assistance with bathing/showering or dressing?: No Do you need assistance with meal preparation?: No Do you need assistance with eating?: No Do you have difficulty maintaining continence: No Do you need assistance with getting out of bed/getting out of a chair/moving?:  No Do you have difficulty managing or taking your medications?: No  Follow up appointments reviewed: PCP Follow-up appointment confirmed?: Yes Date of PCP follow-up appointment?: 11/13/23 Follow-up Provider: Greig Drones, NP Specialist Hospital Follow-up appointment confirmed?: Yes Date of Specialist follow-up appointment?: 11/12/23 Follow-Up Specialty Provider:: attends dialysis TTS Do you need transportation to your follow-up appointment?: No Do you understand care options if your condition(s) worsen?: Yes-patient verbalized understanding    SIGNATURE Slater Diesel, RN

## 2023-11-11 ENCOUNTER — Ambulatory Visit: Payer: Self-pay | Admitting: Family

## 2023-11-11 LAB — HEPATITIS B SURFACE ANTIBODY, QUANTITATIVE: Hep B S AB Quant (Post): 31.4 m[IU]/mL

## 2023-11-11 NOTE — Telephone Encounter (Signed)
 Noted

## 2023-11-12 DIAGNOSIS — Z992 Dependence on renal dialysis: Secondary | ICD-10-CM | POA: Diagnosis not present

## 2023-11-12 DIAGNOSIS — N186 End stage renal disease: Secondary | ICD-10-CM | POA: Diagnosis not present

## 2023-11-12 DIAGNOSIS — N2581 Secondary hyperparathyroidism of renal origin: Secondary | ICD-10-CM | POA: Diagnosis not present

## 2023-11-13 ENCOUNTER — Other Ambulatory Visit: Payer: Self-pay

## 2023-11-13 ENCOUNTER — Encounter: Payer: Self-pay | Admitting: Family

## 2023-11-13 ENCOUNTER — Ambulatory Visit: Payer: Self-pay | Admitting: Family

## 2023-11-13 ENCOUNTER — Ambulatory Visit (INDEPENDENT_AMBULATORY_CARE_PROVIDER_SITE_OTHER): Admitting: Family

## 2023-11-13 VITALS — BP 157/99 | HR 83 | Temp 98.2°F | Resp 16 | Ht 71.0 in | Wt 243.6 lb

## 2023-11-13 DIAGNOSIS — I428 Other cardiomyopathies: Secondary | ICD-10-CM | POA: Diagnosis not present

## 2023-11-13 DIAGNOSIS — E1169 Type 2 diabetes mellitus with other specified complication: Secondary | ICD-10-CM

## 2023-11-13 DIAGNOSIS — F1490 Cocaine use, unspecified, uncomplicated: Secondary | ICD-10-CM

## 2023-11-13 DIAGNOSIS — E1122 Type 2 diabetes mellitus with diabetic chronic kidney disease: Secondary | ICD-10-CM | POA: Diagnosis not present

## 2023-11-13 DIAGNOSIS — F419 Anxiety disorder, unspecified: Secondary | ICD-10-CM

## 2023-11-13 DIAGNOSIS — D638 Anemia in other chronic diseases classified elsewhere: Secondary | ICD-10-CM

## 2023-11-13 DIAGNOSIS — J449 Chronic obstructive pulmonary disease, unspecified: Secondary | ICD-10-CM

## 2023-11-13 DIAGNOSIS — N400 Enlarged prostate without lower urinary tract symptoms: Secondary | ICD-10-CM

## 2023-11-13 DIAGNOSIS — I5033 Acute on chronic diastolic (congestive) heart failure: Secondary | ICD-10-CM

## 2023-11-13 DIAGNOSIS — E785 Hyperlipidemia, unspecified: Secondary | ICD-10-CM | POA: Diagnosis not present

## 2023-11-13 DIAGNOSIS — R002 Palpitations: Secondary | ICD-10-CM | POA: Diagnosis not present

## 2023-11-13 DIAGNOSIS — R7989 Other specified abnormal findings of blood chemistry: Secondary | ICD-10-CM

## 2023-11-13 DIAGNOSIS — Z09 Encounter for follow-up examination after completed treatment for conditions other than malignant neoplasm: Secondary | ICD-10-CM

## 2023-11-13 DIAGNOSIS — K219 Gastro-esophageal reflux disease without esophagitis: Secondary | ICD-10-CM

## 2023-11-13 DIAGNOSIS — G894 Chronic pain syndrome: Secondary | ICD-10-CM

## 2023-11-13 DIAGNOSIS — I152 Hypertension secondary to endocrine disorders: Secondary | ICD-10-CM

## 2023-11-13 DIAGNOSIS — I10 Essential (primary) hypertension: Secondary | ICD-10-CM

## 2023-11-13 DIAGNOSIS — N186 End stage renal disease: Secondary | ICD-10-CM

## 2023-11-13 DIAGNOSIS — E119 Type 2 diabetes mellitus without complications: Secondary | ICD-10-CM

## 2023-11-13 DIAGNOSIS — Z716 Tobacco abuse counseling: Secondary | ICD-10-CM

## 2023-11-13 LAB — POCT GLYCOSYLATED HEMOGLOBIN (HGB A1C): Hemoglobin A1C: 7 % — AB (ref 4.0–5.6)

## 2023-11-13 MED ORDER — DOXAZOSIN MESYLATE 4 MG PO TABS
12.0000 mg | ORAL_TABLET | Freq: Every evening | ORAL | 0 refills | Status: AC
  Filled 2023-11-13 – 2024-04-06 (×2): qty 90, 30d supply, fill #0

## 2023-11-13 MED ORDER — NITROGLYCERIN 0.4 MG SL SUBL
0.4000 mg | SUBLINGUAL_TABLET | SUBLINGUAL | 0 refills | Status: AC | PRN
  Filled 2023-11-13: qty 25, 30d supply, fill #0

## 2023-11-13 MED ORDER — BUDESONIDE-FORMOTEROL FUMARATE 160-4.5 MCG/ACT IN AERO
2.0000 | INHALATION_SPRAY | Freq: Two times a day (BID) | RESPIRATORY_TRACT | 2 refills | Status: DC
Start: 2023-11-13 — End: 2024-02-02
  Filled 2023-11-13: qty 10.2, 30d supply, fill #0

## 2023-11-13 MED ORDER — INSULIN PEN NEEDLE 31G X 6 MM MISC
1.0000 | Freq: Four times a day (QID) | 3 refills | Status: AC
  Filled 2023-11-13: qty 200, 50d supply, fill #0

## 2023-11-13 MED ORDER — ATORVASTATIN CALCIUM 80 MG PO TABS
80.0000 mg | ORAL_TABLET | Freq: Every day | ORAL | 0 refills | Status: AC
  Filled 2023-11-13: qty 90, 90d supply, fill #0

## 2023-11-13 MED ORDER — HYDRALAZINE HCL 100 MG PO TABS
100.0000 mg | ORAL_TABLET | Freq: Three times a day (TID) | ORAL | 0 refills | Status: AC
  Filled 2023-11-13: qty 270, 90d supply, fill #0

## 2023-11-13 MED ORDER — NICOTINE 14 MG/24HR TD PT24
14.0000 mg | MEDICATED_PATCH | Freq: Every day | TRANSDERMAL | 2 refills | Status: DC | PRN
  Filled 2023-11-13: qty 28, 28d supply, fill #0

## 2023-11-13 MED ORDER — BASAGLAR KWIKPEN 100 UNIT/ML ~~LOC~~ SOPN
20.0000 [IU] | PEN_INJECTOR | Freq: Every day | SUBCUTANEOUS | 3 refills | Status: AC
  Filled 2023-11-13: qty 15, 75d supply, fill #0

## 2023-11-13 MED ORDER — ASPIRIN 81 MG PO TBEC
DELAYED_RELEASE_TABLET | ORAL | 0 refills | Status: AC
  Filled 2023-11-13: qty 90, 45d supply, fill #0

## 2023-11-13 MED ORDER — INSULIN LISPRO 100 UNIT/ML IJ SOLN
0.0000 [IU] | Freq: Three times a day (TID) | INTRAMUSCULAR | 2 refills | Status: AC | PRN
  Filled 2023-11-13: qty 10, 84d supply, fill #0

## 2023-11-13 MED ORDER — AMLODIPINE BESYLATE 10 MG PO TABS
10.0000 mg | ORAL_TABLET | Freq: Every day | ORAL | 0 refills | Status: AC
  Filled 2023-11-13: qty 90, 90d supply, fill #0

## 2023-11-13 MED ORDER — CALCIUM CARBONATE ANTACID 500 MG PO CHEW
1.0000 | CHEWABLE_TABLET | Freq: Three times a day (TID) | ORAL | 0 refills | Status: AC
Start: 2023-11-13 — End: ?
  Filled 2023-11-13: qty 270, 90d supply, fill #0

## 2023-11-13 MED ORDER — LIRAGLUTIDE 18 MG/3ML ~~LOC~~ SOPN
1.8000 mg | PEN_INJECTOR | Freq: Every day | SUBCUTANEOUS | 2 refills | Status: AC
  Filled 2023-11-13: qty 15, 50d supply, fill #0

## 2023-11-13 MED ORDER — ALBUTEROL SULFATE (2.5 MG/3ML) 0.083% IN NEBU
2.5000 mg | INHALATION_SOLUTION | Freq: Four times a day (QID) | RESPIRATORY_TRACT | 3 refills | Status: DC | PRN
  Filled 2023-11-13: qty 150, 13d supply, fill #0

## 2023-11-13 MED ORDER — SERTRALINE HCL 25 MG PO TABS
25.0000 mg | ORAL_TABLET | Freq: Every day | ORAL | 2 refills | Status: AC
  Filled 2023-11-13: qty 30, 30d supply, fill #0

## 2023-11-13 MED ORDER — CARVEDILOL 25 MG PO TABS
25.0000 mg | ORAL_TABLET | Freq: Two times a day (BID) | ORAL | 0 refills | Status: AC
  Filled 2023-11-13: qty 180, 90d supply, fill #0

## 2023-11-13 NOTE — Progress Notes (Signed)
 Patient ID: [name], male    DOB: 02-12-1971  MRN: [id number]  CC: Hospital Discharge Follow-Up  Subjective: Matthew Odom is a 53 y.o. male who presents for Hospital Discharge follow-up.   His concerns today include:  Patient seen on 11/08/2023 - 11/09/2023 at [facility] for heart failure and additional diagnosis. Patient was then seen on 11/09/2023 at [facility] Emergency Department at [facility] for palpitations and additional diagnosis. Patient reports since then he is feeling improved. He denies red flag symptoms. He needs medication refills. He has not been seen by any specialists recently except for hemodialysis.   Patient Active Problem List   Diagnosis Date Noted   Anemia of chronic disease 11/08/2023   Acute on chronic heart failure with preserved ejection fraction (HFpEF) (HCC) 11/08/2023   SVT (supraventricular tachycardia) (HCC) 11/12/2021   COPD not affecting current episode of care 11/12/2021   OSA on CPAP 10/11/2021   Type 2 diabetes mellitus with chronic kidney disease on chronic dialysis, with long-term current use of insulin  (HCC) 06/19/2021   AKI (acute kidney injury) (HCC) 05/07/2021   Hyperglycemia due to diabetes mellitus (HCC) 05/07/2021   Acute-on-chronic kidney injury (HCC) 05/07/2021   Hypokalemia 05/07/2021   Hypocalcemia 05/07/2021   Hyperglycemia 11/08/2020   Orthostatic hypotension 11/28/2019   Type 2 diabetes mellitus, with long-term current use of insulin  (HCC) 09/27/2018   Chest pain 06/29/2018   Elevated troponin 06/29/2018   Degenerative spondylolisthesis 04/04/2018   Lumbar radiculopathy 04/04/2018   Spinal stenosis of lumbar region with neurogenic claudication 04/04/2018   Synovial cyst of lumbar facet joint 04/04/2018   ESRD on dialysis (HCC) 01/03/2018   Hypertension associated with diabetes (HCC) 01/02/2018   Hyperlipidemia associated with type 2 diabetes mellitus (HCC) 01/02/2018   Abnormal EKG 01/02/2018   COPD with  acute bronchitis (HCC) 01/02/2018   Nicotine  abuse 01/02/2018   Chronic combined systolic and diastolic CHF, NYHA class 3 (HCC) 02/15/2016   LVH (left ventricular hypertrophy) due to hypertensive disease 02/15/2016   Nonischemic cardiomyopathy (HCC) 03/23/2015     Current Outpatient Medications on File Prior to Visit  Medication Sig Dispense Refill   acetaminophen  (TYLENOL ) 500 MG tablet Take 500-1,000 mg by mouth every 6 (six) hours as needed (pain.).     albuterol  (PROVENTIL ) (2.5 MG/3ML) 0.083% nebulizer solution Take 3 mLs (2.5 mg total) by nebulization every 6 (six) hours as needed for wheezing or shortness of breath. 75 mL 12   calcium  acetate (PHOSLO ) 667 MG capsule Take 2 capsules (1,334 mg total) by mouth 3 (three) times daily with meals (Patient not taking: Reported on 11/10/2023) 540 capsule 3   doxazosin  (CARDURA ) 8 MG tablet Take 1 tablet (8 mg total) by mouth daily. (Patient not taking: Reported on 11/10/2023) 90 tablet 3   oxyCODONE  (ROXICODONE ) 5 MG immediate release tablet Take 1 tablet (5 mg total) by mouth every 4 (four) hours as needed (for post op pain). (Patient not taking: Reported on 11/10/2023) 10 tablet 0   No current facility-administered medications on file prior to visit.    Allergies  Allergen Reactions   Aspirin  Hives   Nitroglycerin  Other (See Comments)    Nitroglycerin  paste or IV will make blood pressure go up and cause headaches. The nitroglycerin  tablets do fine.    Social History   Socioeconomic History   Marital status: Married    Spouse name: Not on file   Number of children: 7   Years of education: Not on  file   Highest education level: Not on file  Occupational History   Occupation: disabled  Tobacco Use   Smoking status: Every Day    Current packs/day: 0.50    Average packs/day: 0.5 packs/day for 40.0 years (20.0 ttl pk-yrs)    Types: Cigarettes   Smokeless tobacco: Never   Tobacco comments:    Started smoking again in April.  1/2 ppd.     Vaping Use   Vaping status: Never Used  Substance and Sexual Activity   Alcohol use: Not Currently   Drug use: Not Currently    Comment: years ago per patient   Sexual activity: Yes  Other Topics Concern   Not on file  Social History Narrative   Not on file   Social Drivers of Health   Financial Resource Strain: Low Risk  (09/11/2023)   Overall Financial Resource Strain (CARDIA)    Difficulty of Paying Living Expenses: Not hard at all  Food Insecurity: No Food Insecurity (11/09/2023)   Hunger Vital Sign    Worried About Running Out of Food in the Last Year: Never true    Ran Out of Food in the Last Year: Never true  Transportation Needs: No Transportation Needs (11/09/2023)   PRAPARE - Administrator, Civil Service (Medical): No    Lack of Transportation (Non-Medical): No  Physical Activity: Inactive (09/11/2023)   Exercise Vital Sign    Days of Exercise per Week: 0 days    Minutes of Exercise per Session: 0 min  Stress: No Stress Concern Present (09/11/2023)   [employer] of Occupational Health - Occupational Stress Questionnaire    Feeling of Stress : Not at all  Social Connections: Moderately Integrated (11/09/2023)   Social Connection and Isolation Panel    Frequency of Communication with Friends and Family: More than three times a week    Frequency of Social Gatherings with Friends and Family: More than three times a week    Attends Religious Services: More than 4 times per year    Active Member of [employer] or Organizations: No    Attends Banker Meetings: Never    Marital Status: Married  Catering manager Violence: Not At Risk (11/09/2023)   Humiliation, Afraid, Rape, and Kick questionnaire    Fear of Current or Ex-Partner: No    Emotionally Abused: No    Physically Abused: No    Sexually Abused: No    Family History  Problem Relation Age of Onset   Heart attack Mother    Hypertension Mother    Diabetes Mother    Heart disease Sister     Hypertension Sister    Hypertension Father    Emphysema Father     Past Surgical History:  Procedure Laterality Date   ANKLE ARTHROSCOPY Left 06/18/2023   Procedure: LEFT ANKLE ARTHROSCOPIC DEBRIDEMENT;  Surgeon: Elsa Lonni SAUNDERS, MD;  Location: [facility];  Service: Orthopedics;  Laterality: Left;   AV FISTULA PLACEMENT Left 07/17/2021   Procedure: CREATION  OF LEFT ARM RADIOCEPHALIC ARTERIOVENOUS (AV) FISTULA;  Surgeon: Serene Gaile ORN, MD;  Location: [facility] OR;  Service: Vascular;  Laterality: Left;   CARDIAC CATHETERIZATION     IR FLUORO GUIDE CV LINE RIGHT  05/20/2021   IR US  GUIDE VASC ACCESS RIGHT  05/20/2021   LIGATION OF COMPETING BRANCHES OF ARTERIOVENOUS FISTULA Left 09/13/2021   Procedure: LIGATION AND ELEVATION OF COMPETING BRANCHES OF LEFT ARM ARTERIOVENOUS FISTULA;  Surgeon: Serene Gaile ORN, MD;  Location: [facility] OR;  Service: Vascular;  Laterality: Left;   ORIF ANKLE FRACTURE Left 06/18/2023   Procedure: OPEN TREATMENT LATERAL MALLEOLUS;  Surgeon: Elsa Lonni SAUNDERS, MD;  Location: [facility];  Service: Orthopedics;  Laterality: Left;   SYNDESMOSIS REPAIR Left 06/18/2023   Procedure: SYNDESMOSIS REPAIR;  Surgeon: Elsa Lonni SAUNDERS, MD;  Location: [facility];  Service: Orthopedics;  Laterality: Left;    ROS: Review of Systems Negative except as stated above  PHYSICAL EXAM: BP (!) 157/99   Pulse 83   Temp 98.2 F (36.8 C) (Oral)   Resp 16   Ht 5' 11 (1.803 m)   Wt 243 lb 9.6 oz (110.5 kg)   SpO2 94%   BMI 33.98 kg/m   Physical Exam HENT:     Head: Normocephalic and atraumatic.     Nose: Nose normal.     Mouth/Throat:     Mouth: Mucous membranes are moist.     Pharynx: Oropharynx is clear.  Eyes:     Extraocular Movements: Extraocular movements intact.     Conjunctiva/sclera: Conjunctivae normal.     Pupils: Pupils are equal, round, and reactive to light.  Cardiovascular:     Rate and Rhythm: Normal rate and regular rhythm.     Pulses: Normal pulses.     Heart  sounds: Normal heart sounds.  Pulmonary:     Effort: Pulmonary effort is normal.     Breath sounds: Normal breath sounds.  Musculoskeletal:        General: Normal range of motion.     Cervical back: Normal range of motion and neck supple.     Comments: Fistula left upper extremity.   Neurological:     General: No focal deficit present.     Mental Status: He is alert and oriented to person, place, and time.  Psychiatric:        Mood and Affect: Mood normal.        Behavior: Behavior normal.     ASSESSMENT AND PLAN: 1. Hospital discharge follow-up (Primary) - Reviewed hospital course, current medications, ensured proper follow-up in place, and addressed concerns.  - Routine screening.  - Basic Metabolic Panel - CBC  2. Acute on chronic heart failure with preserved ejection fraction (HFpEF) (HCC) 3. Nonischemic cardiomyopathy (HCC) 4. Hypertension associated with diabetes (HCC) 5. Palpitations 6. Elevated troponin 7. Cocaine use - Blood pressure not at goal during today's visit. Patient asymptomatic without chest pressure, chest pain, palpitations, shortness of breath, worst headache of life, and any additional red flag symptoms. - Continue present management.  - Counseled on blood pressure goal of less than 130/80, low-sodium, DASH diet, medication compliance, and 150 minutes of moderate intensity exercise per week as tolerated. Counseled on medication adherence and adverse effects. - Referral to Cardiology for evaluation/management. - Ambulatory referral to Cardiology - amLODipine  (NORVASC ) 10 MG tablet; Take 1 tablet (10 mg total) by mouth daily.  Dispense: 90 tablet; Refill: 0 - aspirin  EC 81 MG tablet; Take one tablet by mouth twice daily for 30 days after sugery for blood clot prevention. Start post op day 1. Swallow whole.  Dispense: 90 tablet; Refill: 0 - hydrALAZINE  (APRESOLINE ) 100 MG tablet; Take 1 tablet (100 mg total) by mouth 3 (three) times daily.  Dispense: 270  tablet; Refill: 0 - nitroGLYCERIN  (NITROSTAT ) 0.4 MG SL tablet; Place 1 tablet (0.4 mg total) under the tongue every 5 (five) minutes as needed for chest pain.  Dispense: 90 tablet; Refill: 0 - carvedilol  (COREG ) 25 MG tablet; Take 1  tablet (25 mg total) by mouth 2 (two) times daily with a meal.  Dispense: 180 tablet; Refill: 0  8. Hyperlipidemia associated with type 2 diabetes mellitus (HCC) - Continue present management.  - Referral to Cardiology for evaluation/management. - Ambulatory referral to Cardiology - atorvastatin  (LIPITOR) 80 MG tablet; Take 1 tablet (80 mg total) by mouth daily.  Dispense: 90 tablet; Refill: 0  9. Type 2 diabetes mellitus with chronic kidney disease on chronic dialysis, with long-term current use of insulin  (HCC) - Continue present management.  - Hemoglobin A1c result pending.  - Discussed the importance of healthy eating habits, low-carbohydrate diet, low-sugar diet, regular aerobic exercise (at least 150 minutes a week as tolerated) and medication compliance to achieve or maintain control of diabetes. Counseled on medication adherence/adverse effects.  - Follow-up with primary provider as scheduled. - HgB A1c - Insulin  Glargine (BASAGLAR  KWIKPEN) 100 UNIT/ML; Inject 20 Units into the skin daily.  Dispense: 30 mL; Refill: 3 - insulin  lispro (HUMALOG ) 100 UNIT/ML injection; Inject 0-0.04 mLs (0-4 Units total) into the skin 3 (three) times daily as needed for high blood sugar.  Dispense: 10 mL; Refill: 2 - Insulin  Pen Needle 31G X 6 MM MISC; Use as directed in the morning, at noon, in the evening, and at bedtime.  Dispense: 400 each; Refill: 3 - liraglutide  (VICTOZA ) 18 MG/3ML SOPN; Inject 1.8 mg into the skin daily.  Dispense: 15 mL; Refill: 2  10. Diabetic eye exam [facility]) - Referral to Ophthalmology for evaluation/management. - Ambulatory referral to Ophthalmology  11. Encounter for diabetic foot exam [facility]) - Referral to Podiatry for evaluation/management. -  Ambulatory referral to Podiatry  12. ESRD on dialysis (HCC) 13. Anemia of chronic disease - Keep all scheduled appointments with hemodialysis.  - Referral to Nephrology for evaluation/management. - Ambulatory referral to Nephrology  14. Chronic obstructive pulmonary disease, unspecified COPD type (HCC) - Continue present management.  - Referral to Pulmonology for evaluation/management. - Ambulatory referral to Pulmonology - albuterol  (PROVENTIL ) (2.5 MG/3ML) 0.083% nebulizer solution; Take 3 mLs (2.5 mg total) by nebulization every 6 (six) hours as needed for wheezing or shortness of breath.  Dispense: 150 mL; Refill: 3 - budesonide -formoterol  (SYMBICORT ) 160-4.5 MCG/ACT inhaler; Inhale 2 puffs into the lungs 2 (two) times daily.  Dispense: 10.2 g; Refill: 2  15. Chronic pain syndrome - I discussed with patient in detail I am unable to prescribe Oxycodone  per [facility] office policy. Patient verbalized understanding/agreement.  - Referral to Pain Clinic for evaluation/management. - Ambulatory referral to Pain Clinic  16. Anxiety and depression - Patient denies thoughts of self-harm, suicidal ideations, homicidal ideations. - Continue present management.  - Follow-up with primary provider as scheduled. - sertraline  (ZOLOFT ) 25 MG tablet; Take 1 tablet (25 mg total) by mouth daily.  Dispense: 30 tablet; Refill: 2  17. Encounter for smoking cessation counseling - Continue present management.  - Follow-up with primary provider as scheduled. - nicotine  (NICODERM CQ  - DOSED IN MG/24 HOURS) 14 mg/24hr patch; Place 1 patch (14 mg total) onto the skin daily as needed.  Dispense: 28 patch; Refill: 2  18. Gastroesophageal reflux disease, unspecified whether esophagitis present - Continue present management.  - Follow-up with primary provider as scheduled. - calcium  carbonate (TUMS) 500 MG chewable tablet; Chew 1 tablet (200 mg of elemental calcium  total) by mouth 3 (three) times daily with  meals.  Dispense: 270 tablet; Refill: 0  19. Benign prostatic hyperplasia, unspecified whether lower urinary tract symptoms present - Continue present  management.  - Follow-up with primary provider as scheduled. - doxazosin  (CARDURA ) 4 MG tablet; Take 3 tablets (12 mg total) by mouth at bedtime.  Dispense: 90 tablet; Refill: 0  Patient was given the opportunity to ask questions.  Patient verbalized understanding of the plan and was able to repeat key elements of the plan. Patient was given clear instructions to go to Emergency Department or return to medical center if symptoms don't improve, worsen, or new problems develop.The patient verbalized understanding.   Orders Placed This Encounter  Procedures   Basic Metabolic Panel   CBC   Ambulatory referral to Podiatry   Ambulatory referral to Ophthalmology   Ambulatory referral to Pulmonology   Ambulatory referral to Cardiology   Ambulatory referral to Pain Clinic   Ambulatory referral to Nephrology   HgB A1c     Requested Prescriptions   Signed Prescriptions Disp Refills   albuterol  (PROVENTIL ) (2.5 MG/3ML) 0.083% nebulizer solution 150 mL 3    Sig: Take 3 mLs (2.5 mg total) by nebulization every 6 (six) hours as needed for wheezing or shortness of breath.   amLODipine  (NORVASC ) 10 MG tablet 90 tablet 0    Sig: Take 1 tablet (10 mg total) by mouth daily.   aspirin  EC 81 MG tablet 90 tablet 0    Sig: Take one tablet by mouth twice daily for 30 days after sugery for blood clot prevention. Start post op day 1. Swallow whole.   atorvastatin  (LIPITOR) 80 MG tablet 90 tablet 0    Sig: Take 1 tablet (80 mg total) by mouth daily.   Insulin  Glargine (BASAGLAR  KWIKPEN) 100 UNIT/ML 30 mL 3    Sig: Inject 20 Units into the skin daily.   calcium  carbonate (TUMS) 500 MG chewable tablet 270 tablet 0    Sig: Chew 1 tablet (200 mg of elemental calcium  total) by mouth 3 (three) times daily with meals.   doxazosin  (CARDURA ) 4 MG tablet 90 tablet 0     Sig: Take 3 tablets (12 mg total) by mouth at bedtime.   hydrALAZINE  (APRESOLINE ) 100 MG tablet 270 tablet 0    Sig: Take 1 tablet (100 mg total) by mouth 3 (three) times daily.   insulin  lispro (HUMALOG ) 100 UNIT/ML injection 10 mL 2    Sig: Inject 0-0.04 mLs (0-4 Units total) into the skin 3 (three) times daily as needed for high blood sugar.   Insulin  Pen Needle 31G X 6 MM MISC 400 each 3    Sig: Use as directed in the morning, at noon, in the evening, and at bedtime.   nicotine  (NICODERM CQ  - DOSED IN MG/24 HOURS) 14 mg/24hr patch 28 patch 2    Sig: Place 1 patch (14 mg total) onto the skin daily as needed.   nitroGLYCERIN  (NITROSTAT ) 0.4 MG SL tablet 90 tablet 0    Sig: Place 1 tablet (0.4 mg total) under the tongue every 5 (five) minutes as needed for chest pain.   sertraline  (ZOLOFT ) 25 MG tablet 30 tablet 2    Sig: Take 1 tablet (25 mg total) by mouth daily.   budesonide -formoterol  (SYMBICORT ) 160-4.5 MCG/ACT inhaler 10.2 g 2    Sig: Inhale 2 puffs into the lungs 2 (two) times daily.   liraglutide  (VICTOZA ) 18 MG/3ML SOPN 15 mL 2    Sig: Inject 1.8 mg into the skin daily.   carvedilol  (COREG ) 25 MG tablet 180 tablet 0    Sig: Take 1 tablet (25 mg total) by mouth 2 (two)  times daily with a meal.    Follow-up with primary provider as scheduled.  Greig JINNY Drones, NP

## 2023-11-13 NOTE — Progress Notes (Signed)
 The second time he went to the emergency room no one told him anything about test results. Patient needs a inhaler, patient has trouble sleeping at night

## 2023-11-14 DIAGNOSIS — Z992 Dependence on renal dialysis: Secondary | ICD-10-CM | POA: Diagnosis not present

## 2023-11-14 DIAGNOSIS — N186 End stage renal disease: Secondary | ICD-10-CM | POA: Diagnosis not present

## 2023-11-14 DIAGNOSIS — N2581 Secondary hyperparathyroidism of renal origin: Secondary | ICD-10-CM | POA: Diagnosis not present

## 2023-11-14 LAB — BASIC METABOLIC PANEL WITH GFR
BUN/Creatinine Ratio: 4 — ABNORMAL LOW (ref 9–20)
BUN: 34 mg/dL — ABNORMAL HIGH (ref 6–24)
CO2: 21 mmol/L (ref 20–29)
Calcium: 8.6 mg/dL — ABNORMAL LOW (ref 8.7–10.2)
Chloride: 94 mmol/L — ABNORMAL LOW (ref 96–106)
Creatinine, Ser: 7.68 mg/dL — ABNORMAL HIGH (ref 0.76–1.27)
Glucose: 140 mg/dL — ABNORMAL HIGH (ref 70–99)
Potassium: 4.2 mmol/L (ref 3.5–5.2)
Sodium: 136 mmol/L (ref 134–144)
eGFR: 8 mL/min/1.73 — ABNORMAL LOW (ref >59)

## 2023-11-14 LAB — CBC
Hematocrit: 34.1 % — ABNORMAL LOW (ref 37.5–51.0)
Hemoglobin: 10.7 g/dL — ABNORMAL LOW (ref 13.0–17.7)
MCH: 27.9 pg (ref 26.6–33.0)
MCHC: 31.4 g/dL — ABNORMAL LOW (ref 31.5–35.7)
MCV: 89 fL (ref 79–97)
Platelets: 243 x10E3/uL (ref 150–450)
RBC: 3.83 x10E6/uL — ABNORMAL LOW (ref 4.14–5.80)
RDW: 15 % (ref 11.6–15.4)
WBC: 8.3 x10E3/uL (ref 3.4–10.8)

## 2023-11-16 DIAGNOSIS — N186 End stage renal disease: Secondary | ICD-10-CM | POA: Diagnosis not present

## 2023-11-16 DIAGNOSIS — Z992 Dependence on renal dialysis: Secondary | ICD-10-CM | POA: Diagnosis not present

## 2023-11-16 DIAGNOSIS — N2581 Secondary hyperparathyroidism of renal origin: Secondary | ICD-10-CM | POA: Diagnosis not present

## 2023-11-19 DIAGNOSIS — Z992 Dependence on renal dialysis: Secondary | ICD-10-CM | POA: Diagnosis not present

## 2023-11-19 DIAGNOSIS — N186 End stage renal disease: Secondary | ICD-10-CM | POA: Diagnosis not present

## 2023-11-19 DIAGNOSIS — N2581 Secondary hyperparathyroidism of renal origin: Secondary | ICD-10-CM | POA: Diagnosis not present

## 2023-11-21 DIAGNOSIS — Z992 Dependence on renal dialysis: Secondary | ICD-10-CM | POA: Diagnosis not present

## 2023-11-21 DIAGNOSIS — N186 End stage renal disease: Secondary | ICD-10-CM | POA: Diagnosis not present

## 2023-11-21 DIAGNOSIS — N2581 Secondary hyperparathyroidism of renal origin: Secondary | ICD-10-CM | POA: Diagnosis not present

## 2023-11-23 ENCOUNTER — Ambulatory Visit (HOSPITAL_COMMUNITY)
Admission: RE | Admit: 2023-11-23 | Discharge: 2023-11-23 | Disposition: A | Discharge status: Home / Self Care | Attending: Vascular Surgery | Admitting: Vascular Surgery

## 2023-11-23 ENCOUNTER — Other Ambulatory Visit: Payer: Self-pay

## 2023-11-23 ENCOUNTER — Encounter (HOSPITAL_COMMUNITY)
Admission: RE | Disposition: A | Discharge status: Home / Self Care | Payer: Self-pay | Source: Home / Self Care | Attending: Vascular Surgery

## 2023-11-23 DIAGNOSIS — E559 Vitamin D deficiency, unspecified: Secondary | ICD-10-CM | POA: Diagnosis not present

## 2023-11-23 DIAGNOSIS — N186 End stage renal disease: Secondary | ICD-10-CM | POA: Diagnosis not present

## 2023-11-23 DIAGNOSIS — Y832 Surgical operation with anastomosis, bypass or graft as the cause of abnormal reaction of the patient, or of later complication, without mention of misadventure at the time of the procedure: Secondary | ICD-10-CM | POA: Diagnosis not present

## 2023-11-23 DIAGNOSIS — Z794 Long term (current) use of insulin: Secondary | ICD-10-CM | POA: Diagnosis not present

## 2023-11-23 DIAGNOSIS — E1122 Type 2 diabetes mellitus with diabetic chronic kidney disease: Secondary | ICD-10-CM | POA: Insufficient documentation

## 2023-11-23 DIAGNOSIS — E119 Type 2 diabetes mellitus without complications: Secondary | ICD-10-CM | POA: Diagnosis not present

## 2023-11-23 DIAGNOSIS — Z1159 Encounter for screening for other viral diseases: Secondary | ICD-10-CM | POA: Diagnosis not present

## 2023-11-23 DIAGNOSIS — M129 Arthropathy, unspecified: Secondary | ICD-10-CM | POA: Diagnosis not present

## 2023-11-23 DIAGNOSIS — Z6835 Body mass index (BMI) 35.0-35.9, adult: Secondary | ICD-10-CM | POA: Diagnosis not present

## 2023-11-23 DIAGNOSIS — G8929 Other chronic pain: Secondary | ICD-10-CM | POA: Diagnosis not present

## 2023-11-23 DIAGNOSIS — Z992 Dependence on renal dialysis: Secondary | ICD-10-CM

## 2023-11-23 DIAGNOSIS — I10 Essential (primary) hypertension: Secondary | ICD-10-CM | POA: Diagnosis not present

## 2023-11-23 DIAGNOSIS — Z79899 Other long term (current) drug therapy: Secondary | ICD-10-CM | POA: Diagnosis not present

## 2023-11-23 DIAGNOSIS — T82898A Other specified complication of vascular prosthetic devices, implants and grafts, initial encounter: Secondary | ICD-10-CM

## 2023-11-23 DIAGNOSIS — M25572 Pain in left ankle and joints of left foot: Secondary | ICD-10-CM | POA: Diagnosis not present

## 2023-11-23 DIAGNOSIS — I120 Hypertensive chronic kidney disease with stage 5 chronic kidney disease or end stage renal disease: Secondary | ICD-10-CM | POA: Insufficient documentation

## 2023-11-23 DIAGNOSIS — F17210 Nicotine dependence, cigarettes, uncomplicated: Secondary | ICD-10-CM | POA: Diagnosis not present

## 2023-11-23 HISTORY — PX: A/V SHUNT INTERVENTION: CATH118220

## 2023-11-23 SURGERY — A/V SHUNT INTERVENTION
Anesthesia: LOCAL | Site: Arm Upper | Laterality: Left

## 2023-11-23 MED ORDER — HEPARIN (PORCINE) IN NACL 1000-0.9 UT/500ML-% IV SOLN
INTRAVENOUS | Status: DC | PRN
  Administered 2023-11-23: 500 mL

## 2023-11-23 MED ORDER — LIDOCAINE HCL (PF) 1 % IJ SOLN
INTRAMUSCULAR | Status: DC | PRN
  Administered 2023-11-23: 5 mL via INTRADERMAL

## 2023-11-23 MED ORDER — IODIXANOL 320 MG/ML IV SOLN
INTRAVENOUS | Status: DC | PRN
  Administered 2023-11-23: 30 mL

## 2023-11-23 MED ORDER — LIDOCAINE HCL (PF) 1 % IJ SOLN
INTRAMUSCULAR | Status: AC
  Filled 2023-11-23: qty 30

## 2023-11-23 SURGICAL SUPPLY — 6 items
KIT PV (KITS) ×2 IMPLANT
SET MICROPUNCTURE 5F STIFF (MISCELLANEOUS) IMPLANT
SHEATH PROBE COVER 6X72 (BAG) IMPLANT
TRAY PV CATH (CUSTOM PROCEDURE TRAY) ×2 IMPLANT
TUBING CIL FLEX 10 FLL-RA (TUBING) IMPLANT
WIRE TORQFLEX AUST .018X40CM (WIRE) IMPLANT

## 2023-11-23 NOTE — H&P (Signed)
 [facility] [place]  ASSESSMENT / PLAN: 53 y.o. male with with left radiocephalic AV fistula dysfunction. Fistula not performing well at dialysis. Fistulagram requested. Plan same in cath lab today.  CHIEF COMPLAINT: malfunction AVF  HISTORY OF PRESENT ILLNESS: Matthew Odom is a 53 y.o. male with ESRD referred to outpatient dialysis access center for evaluation of malfunctioning access. Fistula is causing some alarms on the dialysis circuit. He is able to complete dialysis. Access was made in 2023. It was revised several months later.   Past Medical History:  Diagnosis Date   Anxiety    BPH (benign prostatic hyperplasia)    Cocaine use    07/15/21- last time was more than 10 years ago.   Complication of anesthesia    wakes up during surgery   COPD (chronic obstructive pulmonary disease) (HCC)    Coronary artery disease 2021   mild non obstructed   Diabetes mellitus without complication (HCC)    Enlarged heart    ESRD (end stage renal disease) (HCC)    TTHSAT   History of degenerative disc disease    Hyperlipidemia    Hypertension    NSTEMI (non-ST elevated myocardial infarction) (HCC)    Pneumonia    Sciatic nerve pain    Sleep apnea    Stroke (HCC)    no residual, x 3 last one was in 2020   Thoracic ascending aortic aneurysm (HCC)    4.2 cm 08/03/21 CTA chest    Past Surgical History:  Procedure Laterality Date   ANKLE ARTHROSCOPY Left 06/18/2023   Procedure: LEFT ANKLE ARTHROSCOPIC DEBRIDEMENT;  Surgeon: Elsa Lonni SAUNDERS, MD;  Location: [facility];  Service: Orthopedics;  Laterality: Left;   AV FISTULA PLACEMENT Left 07/17/2021   Procedure: CREATION  OF LEFT ARM RADIOCEPHALIC ARTERIOVENOUS (AV) FISTULA;  Surgeon: Serene Gaile ORN, MD;  Location: [facility];  Service: Vascular;  Laterality: Left;   CARDIAC CATHETERIZATION     IR FLUORO GUIDE CV LINE RIGHT  05/20/2021   IR US  GUIDE VASC ACCESS RIGHT  05/20/2021   LIGATION OF COMPETING BRANCHES OF  ARTERIOVENOUS FISTULA Left 09/13/2021   Procedure: LIGATION AND ELEVATION OF COMPETING BRANCHES OF LEFT ARM ARTERIOVENOUS FISTULA;  Surgeon: Serene Gaile ORN, MD;  Location: [facility];  Service: Vascular;  Laterality: Left;   ORIF ANKLE FRACTURE Left 06/18/2023   Procedure: OPEN TREATMENT LATERAL MALLEOLUS;  Surgeon: Elsa Lonni SAUNDERS, MD;  Location: [facility];  Service: Orthopedics;  Laterality: Left;   SYNDESMOSIS REPAIR Left 06/18/2023   Procedure: SYNDESMOSIS REPAIR;  Surgeon: Elsa Lonni SAUNDERS, MD;  Location: [facility];  Service: Orthopedics;  Laterality: Left;    Family History  Problem Relation Age of Onset   Heart attack Mother    Hypertension Mother    Diabetes Mother    Heart disease Sister    Hypertension Sister    Hypertension Father    Emphysema Father     Social History   Socioeconomic History   Marital status: Married    Spouse name: Not on file   Number of children: 7   Years of education: Not on file   Highest education level: Not on file  Occupational History   Occupation: disabled  Tobacco Use   Smoking status: Every Day    Current packs/day: 0.50    Average packs/day: 0.5 packs/day for 40.0 years (20.0 ttl pk-yrs)    Types: Cigarettes   Smokeless tobacco: Never   Tobacco comments:    Started smoking again  in April.  1/2 ppd.    Vaping Use   Vaping status: Never Used  Substance and Sexual Activity   Alcohol use: Not Currently   Drug use: Not Currently    Comment: years ago per patient   Sexual activity: Yes  Other Topics Concern   Not on file  Social History Narrative   Not on file   Social Drivers of Health   Financial Resource Strain: Low Risk  (09/11/2023)   Overall Financial Resource Strain (CARDIA)    Difficulty of Paying Living Expenses: Not hard at all  Food Insecurity: No Food Insecurity (11/09/2023)   Hunger Vital Sign    Worried About Running Out of Food in the Last Year: Never true    Ran Out of Food in the Last Year: Never true   Transportation Needs: No Transportation Needs (11/09/2023)   PRAPARE - Administrator, Civil Service (Medical): No    Lack of Transportation (Non-Medical): No  Physical Activity: Inactive (09/11/2023)   Exercise Vital Sign    Days of Exercise per Week: 0 days    Minutes of Exercise per Session: 0 min  Stress: No Stress Concern Present (09/11/2023)   [employer] of Occupational Health - Occupational Stress Questionnaire    Feeling of Stress : Not at all  Social Connections: Moderately Integrated (11/09/2023)   Social Connection and Isolation Panel    Frequency of Communication with Friends and Family: More than three times a week    Frequency of Social Gatherings with Friends and Family: More than three times a week    Attends Religious Services: More than 4 times per year    Active Member of [employer] or Organizations: No    Attends Banker Meetings: Never    Marital Status: Married  Catering manager Violence: Not At Risk (11/09/2023)   Humiliation, Afraid, Rape, and Kick questionnaire    Fear of Current or Ex-Partner: No    Emotionally Abused: No    Physically Abused: No    Sexually Abused: No    Allergies  Allergen Reactions   Aspirin  Hives   Nitroglycerin  Other (See Comments)    Nitroglycerin  paste or IV will make blood pressure go up and cause headaches. The nitroglycerin  tablets do fine.    No current facility-administered medications for this encounter.    PHYSICAL EXAM Vitals:   11/23/23 0711  BP: (!) 208/118  Pulse: 93  Resp: 14  SpO2: 98%   Well appearing middle aged man in no distress Regular rate and rhythm Unlabored breathing Strong thrill in L RC AVF  PERTINENT LABORATORY AND RADIOLOGIC DATA  Most recent CBC    Latest Ref Rng & Units 11/13/2023    3:10 PM 11/09/2023    5:52 PM 11/09/2023    4:37 AM  CBC  WBC 3.4 - 10.8 x10E3/uL 8.3  7.6  9.8   Hemoglobin 13.0 - 17.7 g/dL 89.2  89.6  9.7   Hematocrit 37.5 - 51.0 % 34.1  32.4  31.0    Platelets 150 - 450 x10E3/uL 243  220  237      Most recent CMP    Latest Ref Rng & Units 11/13/2023    3:10 PM 11/09/2023    5:52 PM 11/09/2023    4:37 AM  CMP  Glucose 70 - 99 mg/dL 859  763  847   BUN 6 - 24 mg/dL 34  68  90   Creatinine 0.76 - 1.27 mg/dL 2.31  0.07  13.47   Sodium 134 - 144 mmol/L 136  138  138   Potassium 3.5 - 5.2 mmol/L 4.2  3.4  3.8   Chloride 96 - 106 mmol/L 94  98  102   CO2 20 - 29 mmol/L 21  25  20    Calcium  8.7 - 10.2 mg/dL 8.6  8.3  8.1     Renal function Estimated Creatinine Clearance: 14.2 mL/min (A) (by C-G formula based on SCr of 7.68 mg/dL (H)).  Hemoglobin A1C (%)  Date Value  11/13/2023 7.0 (A)   Hgb A1c MFr Bld (%)  Date Value  06/15/2023 7.2 (H)    LDL Chol Calc (NIH)  Date Value Ref Range Status  12/05/2022 132 (H) 0 - 99 mg/dL Final     Debby SAILOR. Magda, MD FACS [facility] Phone Number: [phone] 11/23/2023 7:48 AM   Total time spent on preparing this encounter including chart review, data review, collecting history, examining the patient, and coordinating care: 30 minutes.   Portions of this report may have been transcribed using voice recognition software.  Every effort has been made to ensure accuracy; however, inadvertent computerized transcription errors may still be present.

## 2023-11-23 NOTE — Op Note (Signed)
 DATE OF SERVICE: 11/23/2023  PATIENT:  Matthew Odom  53 y.o. male  PRE-OPERATIVE DIAGNOSIS:  ESRD  POST-OPERATIVE DIAGNOSIS:  Same  PROCEDURE:   1) Ultrasound guided left arm AVF access (CPT [phone]) 2) Left arm fistulagram (CPT [phone]) 3) outpatient established patient evaluation and management (CPT [phone])  SURGEON:  Debby SAILOR. Magda, MD  ASSISTANT: none  ANESTHESIA:   local  ESTIMATED BLOOD LOSS: minimal  LOCAL MEDICATIONS USED:  LIDOCAINE    COUNTS: confirmed correct.  PATIENT DISPOSITION:  PACU - hemodynamically stable.   Delay start of Pharmacological VTE agent (>24hrs) due to surgical blood loss or risk of bleeding: no  INDICATION FOR PROCEDURE: [name] is a 53 y.o. male with ESRD with malfunctioning AVF. After careful discussion of risks, benefits, and alternatives the patient was offered fistulagram. The patient understood and wished to proceed.  OPERATIVE FINDINGS:  No AVF stenosis. No stenosis in deep or superficial veins of upper arm. No axillary vein stenosis. No subclavian vein stenosis. No central venous stenosis.  DESCRIPTION OF PROCEDURE: After identification of the patient in the pre-operative holding area, the patient was transferred to the operating room. The patient was positioned supine on the operating room table. The left arm was prepped and draped in standard fashion. A surgical pause was performed confirming correct patient, procedure, and operative location.  The left arm was anesthetized with subcutaneous injection of 1% lidocaine . Using ultrasound guidance, the left arm fistula was accessed with micropuncture technique. The 1F micropuncture sheath was introduced into the fistula. Fistulagram was performed in stations. See above for details. No intervention was performed. All equipment was removed and a figure of eight stitch applied to the access with good hemostasis.  Upon completion of the case instrument and sharps counts were confirmed correct.  The patient was transferred to the  PACU in good condition. I was present for all portions of the procedure.  PLAN: OK to use fistula. If he continues to have issues, will need new access made.   Debby SAILOR. Magda, MD [facility] Phone Number: [phone] 11/23/2023 8:44 AM

## 2023-11-24 DIAGNOSIS — N186 End stage renal disease: Secondary | ICD-10-CM | POA: Diagnosis not present

## 2023-11-24 DIAGNOSIS — E8770 Fluid overload, unspecified: Secondary | ICD-10-CM | POA: Diagnosis not present

## 2023-11-24 DIAGNOSIS — N2581 Secondary hyperparathyroidism of renal origin: Secondary | ICD-10-CM | POA: Diagnosis not present

## 2023-11-24 DIAGNOSIS — Z992 Dependence on renal dialysis: Secondary | ICD-10-CM | POA: Diagnosis not present

## 2023-11-24 DIAGNOSIS — J810 Acute pulmonary edema: Secondary | ICD-10-CM | POA: Diagnosis not present

## 2023-11-25 ENCOUNTER — Other Ambulatory Visit: Payer: Self-pay

## 2023-11-26 ENCOUNTER — Ambulatory Visit: Admitting: Podiatry

## 2023-11-26 ENCOUNTER — Encounter (HOSPITAL_COMMUNITY): Payer: Self-pay | Admitting: Vascular Surgery

## 2023-11-26 DIAGNOSIS — M25572 Pain in left ankle and joints of left foot: Secondary | ICD-10-CM | POA: Diagnosis not present

## 2023-11-26 DIAGNOSIS — Z992 Dependence on renal dialysis: Secondary | ICD-10-CM | POA: Diagnosis not present

## 2023-11-26 DIAGNOSIS — N2581 Secondary hyperparathyroidism of renal origin: Secondary | ICD-10-CM | POA: Diagnosis not present

## 2023-11-26 DIAGNOSIS — N186 End stage renal disease: Secondary | ICD-10-CM | POA: Diagnosis not present

## 2023-11-27 ENCOUNTER — Other Ambulatory Visit: Payer: Self-pay

## 2023-11-28 DIAGNOSIS — N2581 Secondary hyperparathyroidism of renal origin: Secondary | ICD-10-CM | POA: Diagnosis not present

## 2023-11-28 DIAGNOSIS — Z992 Dependence on renal dialysis: Secondary | ICD-10-CM | POA: Diagnosis not present

## 2023-11-28 DIAGNOSIS — N186 End stage renal disease: Secondary | ICD-10-CM | POA: Diagnosis not present

## 2023-12-01 ENCOUNTER — Other Ambulatory Visit: Payer: Self-pay

## 2023-12-01 DIAGNOSIS — Z79899 Other long term (current) drug therapy: Secondary | ICD-10-CM | POA: Diagnosis not present

## 2023-12-03 DIAGNOSIS — Z992 Dependence on renal dialysis: Secondary | ICD-10-CM | POA: Diagnosis not present

## 2023-12-03 DIAGNOSIS — N186 End stage renal disease: Secondary | ICD-10-CM | POA: Diagnosis not present

## 2023-12-03 DIAGNOSIS — E1129 Type 2 diabetes mellitus with other diabetic kidney complication: Secondary | ICD-10-CM | POA: Diagnosis not present

## 2023-12-03 DIAGNOSIS — N2581 Secondary hyperparathyroidism of renal origin: Secondary | ICD-10-CM | POA: Diagnosis not present

## 2023-12-04 NOTE — Procedures (Signed)
 SABRA

## 2023-12-07 DIAGNOSIS — Z992 Dependence on renal dialysis: Secondary | ICD-10-CM | POA: Diagnosis not present

## 2023-12-07 DIAGNOSIS — N2581 Secondary hyperparathyroidism of renal origin: Secondary | ICD-10-CM | POA: Diagnosis not present

## 2023-12-07 DIAGNOSIS — N186 End stage renal disease: Secondary | ICD-10-CM | POA: Diagnosis not present

## 2023-12-08 DIAGNOSIS — N2581 Secondary hyperparathyroidism of renal origin: Secondary | ICD-10-CM | POA: Diagnosis not present

## 2023-12-08 DIAGNOSIS — Z992 Dependence on renal dialysis: Secondary | ICD-10-CM | POA: Diagnosis not present

## 2023-12-08 DIAGNOSIS — N186 End stage renal disease: Secondary | ICD-10-CM | POA: Diagnosis not present

## 2023-12-10 DIAGNOSIS — N2581 Secondary hyperparathyroidism of renal origin: Secondary | ICD-10-CM | POA: Diagnosis not present

## 2023-12-10 DIAGNOSIS — N186 End stage renal disease: Secondary | ICD-10-CM | POA: Diagnosis not present

## 2023-12-10 DIAGNOSIS — Z992 Dependence on renal dialysis: Secondary | ICD-10-CM | POA: Diagnosis not present

## 2023-12-12 DIAGNOSIS — N2581 Secondary hyperparathyroidism of renal origin: Secondary | ICD-10-CM | POA: Diagnosis not present

## 2023-12-12 DIAGNOSIS — N186 End stage renal disease: Secondary | ICD-10-CM | POA: Diagnosis not present

## 2023-12-12 DIAGNOSIS — Z992 Dependence on renal dialysis: Secondary | ICD-10-CM | POA: Diagnosis not present

## 2023-12-15 DIAGNOSIS — N186 End stage renal disease: Secondary | ICD-10-CM | POA: Diagnosis not present

## 2023-12-15 DIAGNOSIS — N2581 Secondary hyperparathyroidism of renal origin: Secondary | ICD-10-CM | POA: Diagnosis not present

## 2023-12-15 DIAGNOSIS — Z992 Dependence on renal dialysis: Secondary | ICD-10-CM | POA: Diagnosis not present

## 2023-12-17 DIAGNOSIS — N186 End stage renal disease: Secondary | ICD-10-CM | POA: Diagnosis not present

## 2023-12-17 DIAGNOSIS — N2581 Secondary hyperparathyroidism of renal origin: Secondary | ICD-10-CM | POA: Diagnosis not present

## 2023-12-17 DIAGNOSIS — Z992 Dependence on renal dialysis: Secondary | ICD-10-CM | POA: Diagnosis not present

## 2023-12-19 DIAGNOSIS — N186 End stage renal disease: Secondary | ICD-10-CM | POA: Diagnosis not present

## 2023-12-19 DIAGNOSIS — N2581 Secondary hyperparathyroidism of renal origin: Secondary | ICD-10-CM | POA: Diagnosis not present

## 2023-12-19 DIAGNOSIS — Z992 Dependence on renal dialysis: Secondary | ICD-10-CM | POA: Diagnosis not present

## 2023-12-22 DIAGNOSIS — Z992 Dependence on renal dialysis: Secondary | ICD-10-CM | POA: Diagnosis not present

## 2023-12-22 DIAGNOSIS — N186 End stage renal disease: Secondary | ICD-10-CM | POA: Diagnosis not present

## 2023-12-22 DIAGNOSIS — N2581 Secondary hyperparathyroidism of renal origin: Secondary | ICD-10-CM | POA: Diagnosis not present

## 2023-12-24 DIAGNOSIS — N2581 Secondary hyperparathyroidism of renal origin: Secondary | ICD-10-CM | POA: Diagnosis not present

## 2023-12-24 DIAGNOSIS — N186 End stage renal disease: Secondary | ICD-10-CM | POA: Diagnosis not present

## 2023-12-24 DIAGNOSIS — Z992 Dependence on renal dialysis: Secondary | ICD-10-CM | POA: Diagnosis not present

## 2023-12-25 ENCOUNTER — Other Ambulatory Visit: Payer: Self-pay

## 2023-12-25 ENCOUNTER — Encounter (HOSPITAL_COMMUNITY)
Admission: RE | Disposition: A | Discharge status: Home / Self Care | Payer: Self-pay | Source: Home / Self Care | Attending: Vascular Surgery

## 2023-12-25 ENCOUNTER — Ambulatory Visit (HOSPITAL_COMMUNITY)
Admission: RE | Admit: 2023-12-25 | Discharge: 2023-12-25 | Disposition: A | Discharge status: Home / Self Care | Attending: Vascular Surgery | Admitting: Vascular Surgery

## 2023-12-25 DIAGNOSIS — Z794 Long term (current) use of insulin: Secondary | ICD-10-CM | POA: Insufficient documentation

## 2023-12-25 DIAGNOSIS — I120 Hypertensive chronic kidney disease with stage 5 chronic kidney disease or end stage renal disease: Secondary | ICD-10-CM | POA: Insufficient documentation

## 2023-12-25 DIAGNOSIS — E1122 Type 2 diabetes mellitus with diabetic chronic kidney disease: Secondary | ICD-10-CM | POA: Diagnosis not present

## 2023-12-25 DIAGNOSIS — Z79899 Other long term (current) drug therapy: Secondary | ICD-10-CM | POA: Insufficient documentation

## 2023-12-25 DIAGNOSIS — N2581 Secondary hyperparathyroidism of renal origin: Secondary | ICD-10-CM | POA: Diagnosis not present

## 2023-12-25 DIAGNOSIS — N186 End stage renal disease: Secondary | ICD-10-CM | POA: Diagnosis not present

## 2023-12-25 DIAGNOSIS — Z992 Dependence on renal dialysis: Secondary | ICD-10-CM | POA: Diagnosis not present

## 2023-12-25 DIAGNOSIS — T82848A Pain from vascular prosthetic devices, implants and grafts, initial encounter: Secondary | ICD-10-CM | POA: Insufficient documentation

## 2023-12-25 DIAGNOSIS — F17210 Nicotine dependence, cigarettes, uncomplicated: Secondary | ICD-10-CM | POA: Insufficient documentation

## 2023-12-25 DIAGNOSIS — T82590A Other mechanical complication of surgically created arteriovenous fistula, initial encounter: Secondary | ICD-10-CM | POA: Diagnosis not present

## 2023-12-25 DIAGNOSIS — Y832 Surgical operation with anastomosis, bypass or graft as the cause of abnormal reaction of the patient, or of later complication, without mention of misadventure at the time of the procedure: Secondary | ICD-10-CM | POA: Insufficient documentation

## 2023-12-25 DIAGNOSIS — M7989 Other specified soft tissue disorders: Secondary | ICD-10-CM | POA: Diagnosis not present

## 2023-12-25 HISTORY — PX: A/V SHUNT INTERVENTION: CATH118220

## 2023-12-25 LAB — GLUCOSE, CAPILLARY: Glucose-Capillary: 92 mg/dL (ref 70–99)

## 2023-12-25 SURGERY — A/V SHUNT INTERVENTION
Anesthesia: LOCAL | Site: Arm Lower | Laterality: Left

## 2023-12-25 MED ORDER — HEPARIN (PORCINE) IN NACL 1000-0.9 UT/500ML-% IV SOLN
INTRAVENOUS | Status: DC | PRN
  Administered 2023-12-25: 500 mL

## 2023-12-25 MED ORDER — LIDOCAINE HCL (PF) 1 % IJ SOLN
INTRAMUSCULAR | Status: DC | PRN
  Administered 2023-12-25: 2 mL via SUBCUTANEOUS

## 2023-12-25 MED ORDER — IODIXANOL 320 MG/ML IV SOLN
INTRAVENOUS | Status: DC | PRN
Start: 2023-12-25 — End: 2023-12-25
  Administered 2023-12-25: 30 mL via INTRAVENOUS

## 2023-12-25 MED ORDER — LIDOCAINE HCL (PF) 1 % IJ SOLN
INTRAMUSCULAR | Status: AC
  Filled 2023-12-25: qty 30

## 2023-12-25 SURGICAL SUPPLY — 5 items
KIT MICROPUNCTURE NIT STIFF (SHEATH) IMPLANT
SHEATH PROBE COVER 6X72 (BAG) IMPLANT
STOPCOCK MORSE 400PSI 3WAY (MISCELLANEOUS) IMPLANT
TRAY PV CATH (CUSTOM PROCEDURE TRAY) ×2 IMPLANT
TUBING CIL FLEX 10 FLL-RA (TUBING) IMPLANT

## 2023-12-25 NOTE — H&P (Signed)
 H&P     History of Present Illness: This is a 53 y.o. male with end-stage renal disease that presents for fistulogram.  Patient is here with his wife and states he had pain and swelling after they accessed his fistula.  He just had a fistulogram 3 weeks ago with no obvious findings with Dr. Magda.  Past Medical History:  Diagnosis Date   Anxiety    BPH (benign prostatic hyperplasia)    Cocaine use    07/15/21- last time was more than 10 years ago.   Complication of anesthesia    wakes up during surgery   COPD (chronic obstructive pulmonary disease) (HCC)    Coronary artery disease 2021   mild non obstructed   Diabetes mellitus without complication (HCC)    Enlarged heart    ESRD (end stage renal disease) (HCC)    TTHSAT   History of degenerative disc disease    Hyperlipidemia    Hypertension    NSTEMI (non-ST elevated myocardial infarction) (HCC)    Pneumonia    Sciatic nerve pain    Sleep apnea    Stroke (HCC)    no residual, x 3 last one was in 2020   Thoracic ascending aortic aneurysm (HCC)    4.2 cm 08/03/21 CTA chest    Past Surgical History:  Procedure Laterality Date   A/V SHUNT INTERVENTION Left 11/23/2023   Procedure: A/V SHUNT INTERVENTION;  Surgeon: Magda Debby SAILOR, MD;  Location: [facility];  Service: Cardiovascular;  Laterality: Left;   ANKLE ARTHROSCOPY Left 06/18/2023   Procedure: LEFT ANKLE ARTHROSCOPIC DEBRIDEMENT;  Surgeon: Elsa Lonni SAUNDERS, MD;  Location: [facility];  Service: Orthopedics;  Laterality: Left;   AV FISTULA PLACEMENT Left 07/17/2021   Procedure: CREATION  OF LEFT ARM RADIOCEPHALIC ARTERIOVENOUS (AV) FISTULA;  Surgeon: Serene Gaile ORN, MD;  Location: [facility];  Service: Vascular;  Laterality: Left;   CARDIAC CATHETERIZATION     IR FLUORO GUIDE CV LINE RIGHT  05/20/2021   IR US  GUIDE VASC ACCESS RIGHT  05/20/2021   LIGATION OF COMPETING BRANCHES OF ARTERIOVENOUS FISTULA Left 09/13/2021   Procedure: LIGATION AND ELEVATION OF COMPETING  BRANCHES OF LEFT ARM ARTERIOVENOUS FISTULA;  Surgeon: Serene Gaile ORN, MD;  Location: [facility];  Service: Vascular;  Laterality: Left;   ORIF ANKLE FRACTURE Left 06/18/2023   Procedure: OPEN TREATMENT LATERAL MALLEOLUS;  Surgeon: Elsa Lonni SAUNDERS, MD;  Location: [facility];  Service: Orthopedics;  Laterality: Left;   SYNDESMOSIS REPAIR Left 06/18/2023   Procedure: SYNDESMOSIS REPAIR;  Surgeon: Elsa Lonni SAUNDERS, MD;  Location: [facility];  Service: Orthopedics;  Laterality: Left;    Allergies  Allergen Reactions   Aspirin  Hives   Nitroglycerin  Other (See Comments)    Nitroglycerin  paste or IV will make blood pressure go up and cause headaches. The nitroglycerin  tablets do fine.    Prior to Admission medications   Medication Sig Start Date End Date Taking? Authorizing Provider  acetaminophen  (TYLENOL ) 500 MG tablet Take 500-1,000 mg by mouth every 6 (six) hours as needed (pain.).    [provider]  albuterol  (PROVENTIL ) (2.5 MG/3ML) 0.083% nebulizer solution Take 3 mLs (2.5 mg total) by nebulization every 6 (six) hours as needed for wheezing or shortness of breath. 05/25/23   Laurice Maude BROCKS, MD  albuterol  (PROVENTIL ) (2.5 MG/3ML) 0.083% nebulizer solution Take 3 mLs (2.5 mg total) by nebulization every 6 (six) hours as needed for wheezing or shortness of breath. 11/13/23   Lorren Greig PARAS, NP  amLODipine  (NORVASC ) 10 MG tablet Take 1 tablet (10 mg total) by mouth daily. 11/13/23   Lorren Greig PARAS, NP  aspirin  EC 81 MG tablet Take one tablet by mouth twice daily for 30 days after sugery for blood clot prevention. Start post op day 1. Swallow whole. 11/13/23   Lorren Greig PARAS, NP  atorvastatin  (LIPITOR) 80 MG tablet Take 1 tablet (80 mg total) by mouth daily. 11/13/23 02/11/24  Lorren Greig PARAS, NP  budesonide -formoterol  (SYMBICORT ) 160-4.5 MCG/ACT inhaler Inhale 2 puffs into the lungs 2 (two) times daily. 11/13/23   Lorren Greig PARAS, NP  calcium  acetate (PHOSLO ) 667 MG capsule Take 2 capsules  (1,334 mg total) by mouth 3 (three) times daily with meals Patient not taking: Reported on 11/10/2023 06/25/21     calcium  carbonate (TUMS) 500 MG chewable tablet Chew 1 tablet (200 mg of elemental calcium  total) by mouth 3 (three) times daily with meals. 11/13/23   Lorren Greig PARAS, NP  carvedilol  (COREG ) 25 MG tablet Take 1 tablet (25 mg total) by mouth 2 (two) times daily with a meal. 11/13/23   Lorren Greig PARAS, NP  doxazosin  (CARDURA ) 4 MG tablet Take 3 tablets (12 mg total) by mouth at bedtime. 11/13/23   Lorren Greig PARAS, NP  doxazosin  (CARDURA ) 8 MG tablet Take 1 tablet (8 mg total) by mouth daily. Patient not taking: Reported on 11/10/2023 12/05/22   Barbaraann Darryle Ned, MD  hydrALAZINE  (APRESOLINE ) 100 MG tablet Take 1 tablet (100 mg total) by mouth 3 (three) times daily. 11/13/23   Lorren Greig PARAS, NP  Insulin  Glargine (BASAGLAR  KWIKPEN) 100 UNIT/ML Inject 20 Units into the skin daily. 11/13/23   Lorren Greig PARAS, NP  insulin  lispro (HUMALOG ) 100 UNIT/ML injection Inject 0-0.04 mLs (0-4 Units total) into the skin 3 (three) times daily as needed for high blood sugar. 11/13/23   Lorren Greig PARAS, NP  Insulin  Pen Needle 31G X 6 MM MISC Use as directed in the morning, at noon, in the evening, and at bedtime. 11/13/23   Lorren Greig PARAS, NP  liraglutide  (VICTOZA ) 18 MG/3ML SOPN Inject 1.8 mg into the skin daily. 11/13/23   Lorren Greig PARAS, NP  nicotine  (NICODERM CQ  - DOSED IN MG/24 HOURS) 14 mg/24hr patch Place 1 patch (14 mg total) onto the skin daily as needed. 11/13/23   Lorren Greig PARAS, NP  nitroGLYCERIN  (NITROSTAT ) 0.4 MG SL tablet Place 1 tablet (0.4 mg total) under the tongue every 5 (five) minutes as needed for chest pain. 11/13/23   Lorren Greig PARAS, NP  oxyCODONE  (ROXICODONE ) 5 MG immediate release tablet Take 1 tablet (5 mg total) by mouth every 4 (four) hours as needed (for post op pain). Patient not taking: Reported on 11/10/2023 10/14/23   Yolande Lamar BROCKS, MD  sertraline  (ZOLOFT ) 25 MG tablet Take 1  tablet (25 mg total) by mouth daily. 11/13/23   Lorren Greig PARAS, NP    Social History   Socioeconomic History   Marital status: Married    Spouse name: Not on file   Number of children: 7   Years of education: Not on file   Highest education level: Not on file  Occupational History   Occupation: disabled  Tobacco Use   Smoking status: Every Day    Current packs/day: 0.50    Average packs/day: 0.5 packs/day for 40.0 years (20.0 ttl pk-yrs)    Types: Cigarettes   Smokeless tobacco: Never   Tobacco comments:    Started smoking again in April.  1/2 ppd.    Vaping Use   Vaping status: Never Used  Substance and Sexual Activity   Alcohol use: Not Currently   Drug use: Not Currently    Comment: years ago per patient   Sexual activity: Yes  Other Topics Concern   Not on file  Social History Narrative   Not on file   Social Drivers of Health   Financial Resource Strain: Low Risk  (09/11/2023)   Overall Financial Resource Strain (CARDIA)    Difficulty of Paying Living Expenses: Not hard at all  Food Insecurity: No Food Insecurity (11/09/2023)   Hunger Vital Sign    Worried About Running Out of Food in the Last Year: Never true    Ran Out of Food in the Last Year: Never true  Transportation Needs: No Transportation Needs (11/09/2023)   PRAPARE - Administrator, Civil Service (Medical): No    Lack of Transportation (Non-Medical): No  Physical Activity: Inactive (09/11/2023)   Exercise Vital Sign    Days of Exercise per Week: 0 days    Minutes of Exercise per Session: 0 min  Stress: No Stress Concern Present (09/11/2023)   [employer] of Occupational Health - Occupational Stress Questionnaire    Feeling of Stress : Not at all  Social Connections: Moderately Integrated (11/09/2023)   Social Connection and Isolation Panel    Frequency of Communication with Friends and Family: More than three times a week    Frequency of Social Gatherings with Friends and Family: More  than three times a week    Attends Religious Services: More than 4 times per year    Active Member of [employer] or Organizations: No    Attends Banker Meetings: Never    Marital Status: Married  Catering manager Violence: Not At Risk (11/09/2023)   Humiliation, Afraid, Rape, and Kick questionnaire    Fear of Current or Ex-Partner: No    Emotionally Abused: No    Physically Abused: No    Sexually Abused: No     Family History  Problem Relation Age of Onset   Heart attack Mother    Hypertension Mother    Diabetes Mother    Heart disease Sister    Hypertension Sister    Hypertension Father    Emphysema Father     ROS: [x]  Positive   [ ]  Negative   [ ]  All sytems reviewed and are negative  Cardiovascular: []  chest pain/pressure []  palpitations []  SOB lying flat []  DOE []  pain in legs while walking []  pain in legs at rest []  pain in legs at night []  non-healing ulcers []  hx of DVT []  swelling in legs  Pulmonary: []  productive cough []  asthma/wheezing []  home O2  Neurologic: []  weakness in []  arms []  legs []  numbness in []  arms []  legs []  hx of CVA []  mini stroke [] difficulty speaking or slurred speech []  temporary loss of vision in one eye []  dizziness  Hematologic: []  hx of cancer []  bleeding problems []  problems with blood clotting easily  Endocrine:   []  diabetes []  thyroid  disease  GI []  vomiting blood []  blood in stool  GU: []  CKD/renal failure []  HD--[]  M/W/F or []  T/T/S []  burning with urination []  blood in urine  Psychiatric: []  anxiety []  depression  Musculoskeletal: []  arthritis []  joint pain  Integumentary: []  rashes []  ulcers  Constitutional: []  fever []  chills   Physical Examination  Vitals:   12/25/23 0846 12/25/23 0854  BP: ROLLEN)  146/86 (!) 148/86  Pulse: 84 83  Resp: 12 14  Temp: 97.9 F (36.6 C)   SpO2: 91% 95%   There is no height or weight on file to calculate BMI.  General:  WDWN in NAD Gait: Not  observed HENT: WNL, normocephalic Pulmonary: normal non-labored breathing Cardiac: regular, without  Murmurs, rubs or gallops Abdomen:  soft, NT/ND Vascular Exam/Pulses: Left radiocephalic fistula with good thrill  CBC    Component Value Date/Time   WBC 8.3 11/13/2023 1510   WBC 7.6 11/09/2023 1752   RBC 3.83 (L) 11/13/2023 1510   RBC 3.74 (L) 11/09/2023 1752   HGB 10.7 (L) 11/13/2023 1510   HCT 34.1 (L) 11/13/2023 1510   PLT 243 11/13/2023 1510   MCV 89 11/13/2023 1510   MCH 27.9 11/13/2023 1510   MCH 27.5 11/09/2023 1752   MCHC 31.4 (L) 11/13/2023 1510   MCHC 31.8 11/09/2023 1752   RDW 15.0 11/13/2023 1510   LYMPHSABS 2.3 11/08/2023 1947   LYMPHSABS 2.0 11/07/2020 1630   MONOABS 0.8 11/08/2023 1947   EOSABS 0.2 11/08/2023 1947   EOSABS 0.1 11/07/2020 1630   BASOSABS 0.0 11/08/2023 1947   BASOSABS 0.0 11/07/2020 1630    BMET    Component Value Date/Time   NA 136 11/13/2023 1510   K 4.2 11/13/2023 1510   CL 94 (L) 11/13/2023 1510   CO2 21 11/13/2023 1510   GLUCOSE 140 (H) 11/13/2023 1510   GLUCOSE 236 (H) 11/09/2023 1752   BUN 34 (H) 11/13/2023 1510   CREATININE 7.68 (H) 11/13/2023 1510   CALCIUM  8.6 (L) 11/13/2023 1510   GFRNONAA 6 (L) 11/09/2023 1752   GFRAA 38 (L) 04/24/2020 0938    COAGS: No results found for: INR, PROTIME   Non-Invasive Vascular Imaging:    N/A  ASSESSMENT/PLAN: This is a 53 y.o. male 5with end-stage renal disease that presents for fistulogram.  Patient is here with his wife and states he had pain and swelling after they accessed his fistula.  He just had a fistulogram 3 weeks ago with no obvious findings with Dr. Magda.  Discussed with patient and his wife that it sounds like they potentially infiltrated his fistula causing the pain and swelling.  It looks normal on exam today.  He has a great thrill.  I think an additional fistulogram will be of no significant benefit.  They would like to pursue fistulogram since they are here  and feel like there is some technical issue with the fistula.  I discussed likely will need conversion to upper arm fistula if ongoing issues.  Lonni DOROTHA Gaskins, MD [facility] Office: [phone]  Lonni JINNY Gaskins

## 2023-12-25 NOTE — Op Note (Signed)
    Patient name: Matthew Odom MRN: [id number] DOB: 1971-03-17 Sex: male  12/25/2023 Pre-operative Diagnosis: Malfunction left radiocephalic AV fistula Post-operative diagnosis:  Same Surgeon:  Lonni DOROTHA Gaskins, MD Procedure Performed: 1.  Ultrasound-guided access left radiocephalic AV fistula 2.  Left arm fistulogram including central venogram  Indications: 54 year old male with end-stage renal disease using a left radiocephalic AV fistula.  He presents for left arm fistulogram due to pain and swelling after access.  Findings:   Left upper extremity fistulogram showed widely patent radiocephalic fistula with no evidence of flow-limiting stenosis.   Procedure:  The patient was identified in the holding area and taken to [facility] lab.  Placed on the table in the supine position.  The left arm was prepped and draped in standard sterile fashion.  Timeout performed.  1% lidocaine  was anesthetizing the skin over the fistula.  This was accessed under ultrasound guidance and the micro access needle and placed a micro wire and  micro sheath.  Ultimately left upper extremity fistulogram was then obtained including central venogram.  We also get a reflux study.  Widely patent fistula.  4-0 Monocryl was tied around the sheath and this was removed.  Plan: I discussed with him that there is no value in additional fistulogram's.  If he has any ongoing access issues he needs conversion to an upper arm fistula.  Lonni DOROTHA Gaskins, MD [facility] [place] Office: [phone]

## 2023-12-28 ENCOUNTER — Encounter (HOSPITAL_COMMUNITY): Payer: Self-pay | Admitting: Vascular Surgery

## 2023-12-29 DIAGNOSIS — Z992 Dependence on renal dialysis: Secondary | ICD-10-CM | POA: Diagnosis not present

## 2023-12-29 DIAGNOSIS — N186 End stage renal disease: Secondary | ICD-10-CM | POA: Diagnosis not present

## 2023-12-29 DIAGNOSIS — N2581 Secondary hyperparathyroidism of renal origin: Secondary | ICD-10-CM | POA: Diagnosis not present

## 2023-12-31 DIAGNOSIS — Z992 Dependence on renal dialysis: Secondary | ICD-10-CM | POA: Diagnosis not present

## 2023-12-31 DIAGNOSIS — N2581 Secondary hyperparathyroidism of renal origin: Secondary | ICD-10-CM | POA: Diagnosis not present

## 2023-12-31 DIAGNOSIS — N186 End stage renal disease: Secondary | ICD-10-CM | POA: Diagnosis not present

## 2024-01-02 DIAGNOSIS — N186 End stage renal disease: Secondary | ICD-10-CM | POA: Diagnosis not present

## 2024-01-02 DIAGNOSIS — N2581 Secondary hyperparathyroidism of renal origin: Secondary | ICD-10-CM | POA: Diagnosis not present

## 2024-01-02 DIAGNOSIS — Z992 Dependence on renal dialysis: Secondary | ICD-10-CM | POA: Diagnosis not present

## 2024-01-03 DIAGNOSIS — E1129 Type 2 diabetes mellitus with other diabetic kidney complication: Secondary | ICD-10-CM | POA: Diagnosis not present

## 2024-01-03 DIAGNOSIS — Z992 Dependence on renal dialysis: Secondary | ICD-10-CM | POA: Diagnosis not present

## 2024-01-03 DIAGNOSIS — N186 End stage renal disease: Secondary | ICD-10-CM | POA: Diagnosis not present

## 2024-01-05 DIAGNOSIS — N186 End stage renal disease: Secondary | ICD-10-CM | POA: Diagnosis not present

## 2024-01-05 DIAGNOSIS — N2581 Secondary hyperparathyroidism of renal origin: Secondary | ICD-10-CM | POA: Diagnosis not present

## 2024-01-05 DIAGNOSIS — Z992 Dependence on renal dialysis: Secondary | ICD-10-CM | POA: Diagnosis not present

## 2024-01-09 DIAGNOSIS — N186 End stage renal disease: Secondary | ICD-10-CM | POA: Diagnosis not present

## 2024-01-09 DIAGNOSIS — N2581 Secondary hyperparathyroidism of renal origin: Secondary | ICD-10-CM | POA: Diagnosis not present

## 2024-01-09 DIAGNOSIS — Z992 Dependence on renal dialysis: Secondary | ICD-10-CM | POA: Diagnosis not present

## 2024-01-12 DIAGNOSIS — N2581 Secondary hyperparathyroidism of renal origin: Secondary | ICD-10-CM | POA: Diagnosis not present

## 2024-01-12 DIAGNOSIS — N186 End stage renal disease: Secondary | ICD-10-CM | POA: Diagnosis not present

## 2024-01-12 DIAGNOSIS — Z992 Dependence on renal dialysis: Secondary | ICD-10-CM | POA: Diagnosis not present

## 2024-01-14 DIAGNOSIS — N2581 Secondary hyperparathyroidism of renal origin: Secondary | ICD-10-CM | POA: Diagnosis not present

## 2024-01-14 DIAGNOSIS — Z992 Dependence on renal dialysis: Secondary | ICD-10-CM | POA: Diagnosis not present

## 2024-01-14 DIAGNOSIS — N186 End stage renal disease: Secondary | ICD-10-CM | POA: Diagnosis not present

## 2024-01-16 DIAGNOSIS — Z992 Dependence on renal dialysis: Secondary | ICD-10-CM | POA: Diagnosis not present

## 2024-01-16 DIAGNOSIS — N2581 Secondary hyperparathyroidism of renal origin: Secondary | ICD-10-CM | POA: Diagnosis not present

## 2024-01-16 DIAGNOSIS — N186 End stage renal disease: Secondary | ICD-10-CM | POA: Diagnosis not present

## 2024-01-19 DIAGNOSIS — N186 End stage renal disease: Secondary | ICD-10-CM | POA: Diagnosis not present

## 2024-01-19 DIAGNOSIS — Z992 Dependence on renal dialysis: Secondary | ICD-10-CM | POA: Diagnosis not present

## 2024-01-19 DIAGNOSIS — N2581 Secondary hyperparathyroidism of renal origin: Secondary | ICD-10-CM | POA: Diagnosis not present

## 2024-01-21 DIAGNOSIS — Z992 Dependence on renal dialysis: Secondary | ICD-10-CM | POA: Diagnosis not present

## 2024-01-21 DIAGNOSIS — N186 End stage renal disease: Secondary | ICD-10-CM | POA: Diagnosis not present

## 2024-01-21 DIAGNOSIS — N2581 Secondary hyperparathyroidism of renal origin: Secondary | ICD-10-CM | POA: Diagnosis not present

## 2024-01-25 ENCOUNTER — Telehealth: Payer: Self-pay

## 2024-01-25 ENCOUNTER — Other Ambulatory Visit: Payer: Self-pay

## 2024-01-25 ENCOUNTER — Encounter (HOSPITAL_COMMUNITY)
Admission: RE | Disposition: A | Discharge status: Home / Self Care | Payer: Self-pay | Source: Home / Self Care | Attending: Vascular Surgery

## 2024-01-25 ENCOUNTER — Ambulatory Visit (HOSPITAL_COMMUNITY)
Admission: RE | Admit: 2024-01-25 | Discharge: 2024-01-25 | Disposition: A | Discharge status: Home / Self Care | Attending: Vascular Surgery | Admitting: Vascular Surgery

## 2024-01-25 SURGERY — A/V FISTULAGRAM
Anesthesia: LOCAL | Laterality: Left

## 2024-01-25 MED ORDER — LIDOCAINE HCL (PF) 1 % IJ SOLN
INTRAMUSCULAR | Status: AC
  Filled 2024-01-25: qty 30

## 2024-01-25 NOTE — H&P (View-Only) (Signed)
 Patient presents for another fistulagram. He continues to struggle with pain with accessing the fistula. The fistula continues to have a strong thrill. I counseled that another fistulagram will not add anything to his care. He prefers to keep access in his left forearm if possible. I offered him conversion of the fistula to a graft +/- placement of TDC. He is hopeful to avoid a TDC, and would like an early access graft placed, if possible. I counseled that we could try this, but would need to place a [facility] and covert to upper arm access if this failed. He understood. He will be scheduled for a non-dialysis day in the near future.   Debby SAILOR. Magda, MD [facility] Phone Number: [phone] 01/25/2024 7:41 AM

## 2024-01-25 NOTE — Progress Notes (Signed)
 Patient presents for another fistulagram. He continues to struggle with pain with accessing the fistula. The fistula continues to have a strong thrill. I counseled that another fistulagram will not add anything to his care. He prefers to keep access in his left forearm if possible. I offered him conversion of the fistula to a graft +/- placement of TDC. He is hopeful to avoid a TDC, and would like an early access graft placed, if possible. I counseled that we could try this, but would need to place a [facility] and covert to upper arm access if this failed. He understood. He will be scheduled for a non-dialysis day in the near future.   Debby SAILOR. Magda, MD [facility] Phone Number: [phone] 01/25/2024 7:41 AM

## 2024-01-25 NOTE — Telephone Encounter (Signed)
 Attempted to call for surgery scheduling. LVM

## 2024-01-26 DIAGNOSIS — N2581 Secondary hyperparathyroidism of renal origin: Secondary | ICD-10-CM | POA: Diagnosis not present

## 2024-01-26 DIAGNOSIS — N186 End stage renal disease: Secondary | ICD-10-CM | POA: Diagnosis not present

## 2024-01-26 DIAGNOSIS — Z992 Dependence on renal dialysis: Secondary | ICD-10-CM | POA: Diagnosis not present

## 2024-01-27 ENCOUNTER — Other Ambulatory Visit: Payer: Self-pay

## 2024-01-27 DIAGNOSIS — N186 End stage renal disease: Secondary | ICD-10-CM

## 2024-01-28 DIAGNOSIS — N186 End stage renal disease: Secondary | ICD-10-CM | POA: Diagnosis not present

## 2024-01-28 DIAGNOSIS — N2581 Secondary hyperparathyroidism of renal origin: Secondary | ICD-10-CM | POA: Diagnosis not present

## 2024-01-28 DIAGNOSIS — Z992 Dependence on renal dialysis: Secondary | ICD-10-CM | POA: Diagnosis not present

## 2024-02-01 NOTE — Progress Notes (Signed)
 Matthew Odom    [id number]    03-16-1971  Primary Care Physician:Stephens, Greig PARAS, NP Date of Appointment: 02/01/2024 Established Patient Visit  Chief complaint:   Chief Complaint  Patient presents with   Consult    COPD consult. Pt has SOB with both exertion and rest, does not monitor oxygen levels at home. Pt states he has occasional non productive coughing, also c/o of wheezing.    HPI: Matthew Odom is a 53 y.o. man with OSA no compliance with CPAP, ESRD, HFrEF and history of asthma.  Last time patient follow-up with pulmonary was in 2023 and he was prescribed Symbicort .  He is compliant with the medication.  He reports he has worsening of shortness of breath over the last 6 months.    He is compliance with his dialysis session 3 times a week.  Fluid removal no more than 5L.  He has steady weight over the last months between 240 and 244 lb.  He denied any lower extremity edema, however he has orthopnea, and nocturnal dyspnea.  His last echo in 2024 was overall normal.  He does not use CPAP machine because it is uncomfortable.  Patient has history of 40 years of smoking a pack a day and recently has decreased to half a pack a day.  He had nicotine  patches that helped him with the cravings.  Interval Updates -Exercise like walking from the parking lot to the office, strong laughing make him feel short of breath.  His wife reports wheezing during the day but more frequent at night. - He has baseline cough and denied sputum production. -He uses nebulizer 1-2 times a week.  He does not use the albuterol  inhaler. -He had in the last 2 years 2 exacerbations and the the last one was a month ago. Prednisone  was prescribed. -He wants to quit smoking and is requesting nicotine  replacement therapies. He agreed to smoking cessation counseling referral. -He has daytime sleepiness, and he was falling asleep in the encounter. His bicarb has been normal. -He is not interested on Lung  cancer screening at this time but he will think about it and let me know at the next appt.  I have reviewed the patient's family social and past medical history and updated as appropriate.   Past Medical History:  Diagnosis Date   Anxiety    BPH (benign prostatic hyperplasia)    Cocaine use    07/15/21- last time was more than 10 years ago.   Complication of anesthesia    wakes up during surgery   COPD (chronic obstructive pulmonary disease) (HCC)    Coronary artery disease 2021   mild non obstructed   Diabetes mellitus without complication (HCC)    Enlarged heart    ESRD (end stage renal disease) (HCC)    TTHSAT   History of degenerative disc disease    Hyperlipidemia    Hypertension    NSTEMI (non-ST elevated myocardial infarction) (HCC)    Pneumonia    Sciatic nerve pain    Sleep apnea    Stroke (HCC)    no residual, x 3 last one was in 2020   Thoracic ascending aortic aneurysm    4.2 cm 08/03/21 CTA chest    Past Surgical History:  Procedure Laterality Date   A/V SHUNT INTERVENTION Left 11/23/2023   Procedure: A/V SHUNT INTERVENTION;  Surgeon: Magda Debby SAILOR, MD;  Location: [facility];  Service: Cardiovascular;  Laterality: Left;  A/V SHUNT INTERVENTION Left 12/25/2023   Procedure: A/V SHUNT INTERVENTION;  Surgeon: Gretta Lonni PARAS, MD;  Location: [facility];  Service: Cardiovascular;  Laterality: Left;   ANKLE ARTHROSCOPY Left 06/18/2023   Procedure: LEFT ANKLE ARTHROSCOPIC DEBRIDEMENT;  Surgeon: Elsa Lonni SAUNDERS, MD;  Location: [facility];  Service: Orthopedics;  Laterality: Left;   AV FISTULA PLACEMENT Left 07/17/2021   Procedure: CREATION  OF LEFT ARM RADIOCEPHALIC ARTERIOVENOUS (AV) FISTULA;  Surgeon: Serene Gaile ORN, MD;  Location: [facility];  Service: Vascular;  Laterality: Left;   CARDIAC CATHETERIZATION     IR FLUORO GUIDE CV LINE RIGHT  05/20/2021   IR US  GUIDE VASC ACCESS RIGHT  05/20/2021   LIGATION OF COMPETING BRANCHES OF ARTERIOVENOUS FISTULA Left  09/13/2021   Procedure: LIGATION AND ELEVATION OF COMPETING BRANCHES OF LEFT ARM ARTERIOVENOUS FISTULA;  Surgeon: Serene Gaile ORN, MD;  Location: [facility];  Service: Vascular;  Laterality: Left;   ORIF ANKLE FRACTURE Left 06/18/2023   Procedure: OPEN TREATMENT LATERAL MALLEOLUS;  Surgeon: Elsa Lonni SAUNDERS, MD;  Location: [facility];  Service: Orthopedics;  Laterality: Left;   SYNDESMOSIS REPAIR Left 06/18/2023   Procedure: SYNDESMOSIS REPAIR;  Surgeon: Elsa Lonni SAUNDERS, MD;  Location: [facility];  Service: Orthopedics;  Laterality: Left;    Family History  Problem Relation Age of Onset   Heart attack Mother    Hypertension Mother    Diabetes Mother    Heart disease Sister    Hypertension Sister    Hypertension Father    Emphysema Father     Social History   Occupational History   Occupation: disabled  Tobacco Use   Smoking status: Every Day    Current packs/day: 0.50    Average packs/day: 0.5 packs/day for 40.0 years (20.0 ttl pk-yrs)    Types: Cigarettes   Smokeless tobacco: Never   Tobacco comments:    Started smoking again in April.  1/2 ppd.    Vaping Use   Vaping status: Never Used  Substance and Sexual Activity   Alcohol use: Not Currently   Drug use: Not Currently    Comment: years ago per patient   Sexual activity: Yes     Physical Exam: Blood pressure (!) 153/88, pulse 81, temperature 97.9 F (36.6 C), height 5' 11.5 (1.816 m), weight 244 lb 9.6 oz (110.9 kg), SpO2 97%.  Gen:      No acute distress. Falling asleep in time to time but able to answer questions. Lungs:    No increased respiratory effort, symmetric chest wall excursion, clear to auscultation bilaterally, no wheezes or crackles CV:         Regular rate and rhythm; no murmurs, rubs, or gallops.  No pedal edema Extremities: No LE edema Neuro: Alert and oriented able to provide history, sometimes he falls asleep.    Data Reviewed: Imaging: I have personally reviewed the CTPE study April 2023 no  acute PE = stable 3-4 mm pumonary nodules unchanged from 2020.   CXR 11/2023 Normal   Echo 12/2022 1. Left ventricular ejection fraction, by estimation, is 55 to 60%. The  left ventricle has normal function. The left ventricle has no regional  wall motion abnormalities. There is severe concentric left ventricular  hypertrophy. Left ventricular diastolic   parameters are indeterminate.   2. Right ventricular systolic function is normal. The right ventricular  size is mildly enlarged. Tricuspid regurgitation signal is inadequate for  assessing PA pressure.   3. Left atrial size was mildly dilated.  4. The mitral valve is normal in structure. Trivial mitral valve  regurgitation. No evidence of mitral stenosis.   5. The aortic valve is normal in structure. Aortic valve regurgitation is  not visualized. No aortic stenosis is present.   6. The inferior vena cava is normal in size with greater than 50%  respiratory variability, suggesting right atrial pressure of 3 mmHg.   PFTs     Latest Ref Rng & Units 09/04/2021    9:39 AM  PFT Results  FVC-Pre L 2.91   FVC-Predicted Pre % 69   FVC-Post L 3.36   FVC-Predicted Post % 80   Pre FEV1/FVC % % 80   Post FEV1/FCV % % 78   FEV1-Pre L 2.34   FEV1-Predicted Pre % 69   FEV1-Post L 2.63   DLCO uncorrected ml/min/mmHg 20.47   DLCO UNC% % 69   DLCO corrected ml/min/mmHg 23.76   DLCO COR %Predicted % 80   DLVA Predicted % 100    I have personally reviewed the patient's PFTs and show no airflow limitation. He does have a significant BD response. His diffusion capacity was mildly reduced.   Labs:  Immunization status: Immunization History  Administered Date(s) Administered   PFIZER(Purple Top)SARS-COV-2 Vaccination 12/24/2019, 01/14/2020   Pneumococcal Polysaccharide-23 02/13/2017   Tdap 01/14/2023    Bicarb normal Eos 0.2  Assessment:  Dyspnea on exertion His dyspnea is multifactorial likely due to Asthma, ESRD on hemodialysis  and CHF. Patient is compliance with dialysis, his weight is in the normal range.  He has orthopnea and nocturnal dyspnea, however his last echocardiogram was overall unremarkable.  He reports to have worsening shortness of breath over the last 6 months.  He has wheezing, and he is using his albuterol  more frequent, 2 times a day.  I believe his dyspnea is related to uncontrolled asthma.  - Maximize asthma therapy: Symbicort  + Spiriva, albuterol  as need it.  Other options could be Trelegy 200. - Use nebulizer albuterol  twice a day until symptoms improve.  2.  Current smoker Patient has history of 40 years of smoking a pack a day and recently has decreased to half a pack a day.  I have an extensive conversation about smoking counseling.  He will decrease the number of cigarettes a day and use nicotine  patches for cravings.   His last CT scan did not show concerning lung nodules.  I offered him to be enrolled to the lung cancer screening program, however he declined at this time.  He will let me know if he changes mind in the next appointment. - Prescribed nicotine  therapy. -I performed smoking counseling and refer to smoking cessation program -Patient declined lung cancer screening  3. OSA - non compliance with Cpap.  Patient instructions: Dear Mr. Ducat:  You have shortness of breath and wheezing, and I will maximize your asthma treatment with 3 therapies: -Continue the symbicort  twice a day. -I start a new inhaler, spiriva 1puff a day. -Nebulizer albuterol , you can use twice a day for a few weeks until your symptoms settle down.  For your smoking cessation, I am happy you would like to quit! Congratulations! I will start with nicotine  patch and gum to help you with the cravings. I sent a referral for the smoking clinic cessation to continue to follow up with you closely.  I recommend to do a lung cancer screening, however you declined at this time. Let me know if you change your mind at the  next visit.  I will see you in 3 months.  Return in about 3 months (around 05/03/2024).   Marny Patch, MD Pulmonary and Critical Care Medicine [facility]

## 2024-02-02 ENCOUNTER — Other Ambulatory Visit: Payer: Self-pay

## 2024-02-02 ENCOUNTER — Ambulatory Visit

## 2024-02-02 VITALS — BP 153/88 | HR 81 | Temp 97.9°F | Ht 71.5 in | Wt 244.6 lb

## 2024-02-02 DIAGNOSIS — N2581 Secondary hyperparathyroidism of renal origin: Secondary | ICD-10-CM | POA: Diagnosis not present

## 2024-02-02 DIAGNOSIS — Z716 Tobacco abuse counseling: Secondary | ICD-10-CM

## 2024-02-02 DIAGNOSIS — N186 End stage renal disease: Secondary | ICD-10-CM | POA: Diagnosis not present

## 2024-02-02 DIAGNOSIS — Z992 Dependence on renal dialysis: Secondary | ICD-10-CM | POA: Diagnosis not present

## 2024-02-02 DIAGNOSIS — E1129 Type 2 diabetes mellitus with other diabetic kidney complication: Secondary | ICD-10-CM | POA: Diagnosis not present

## 2024-02-02 DIAGNOSIS — J4540 Moderate persistent asthma, uncomplicated: Secondary | ICD-10-CM

## 2024-02-02 DIAGNOSIS — J449 Chronic obstructive pulmonary disease, unspecified: Secondary | ICD-10-CM

## 2024-02-02 MED ORDER — BUDESONIDE-FORMOTEROL FUMARATE 160-4.5 MCG/ACT IN AERO
2.0000 | INHALATION_SPRAY | Freq: Two times a day (BID) | RESPIRATORY_TRACT | 10 refills | Status: AC
  Filled 2024-02-02 (×2): qty 10.2, 30d supply, fill #0
  Filled 2024-05-16: qty 10.2, 30d supply, fill #1

## 2024-02-02 MED ORDER — NICOTINE 7 MG/24HR TD PT24: 21.0000 mg | MEDICATED_PATCH | Freq: Every day | TRANSDERMAL | Status: AC

## 2024-02-02 MED ORDER — NICOTINE POLACRILEX 2 MG MT GUM: 2.0000 mg | CHEWING_GUM | OROMUCOSAL | Status: AC | PRN

## 2024-02-02 MED ORDER — BUDESONIDE-FORMOTEROL FUMARATE 160-4.5 MCG/ACT IN AERO
2.0000 | INHALATION_SPRAY | Freq: Two times a day (BID) | RESPIRATORY_TRACT | 10 refills | Status: DC
  Filled 2024-02-02: qty 10.2, 30d supply, fill #0

## 2024-02-02 MED ORDER — ALBUTEROL SULFATE (2.5 MG/3ML) 0.083% IN NEBU
2.5000 mg | INHALATION_SOLUTION | Freq: Four times a day (QID) | RESPIRATORY_TRACT | 10 refills | Status: AC | PRN
  Filled 2024-02-02: qty 150, 13d supply, fill #0

## 2024-02-02 MED ORDER — TIOTROPIUM BROMIDE MONOHYDRATE 18 MCG IN CAPS
18.0000 ug | ORAL_CAPSULE | Freq: Every day | RESPIRATORY_TRACT | 10 refills | Status: AC
  Filled 2024-02-02: qty 30, 30d supply, fill #0

## 2024-02-02 MED ORDER — ALBUTEROL SULFATE HFA 108 (90 BASE) MCG/ACT IN AERS
2.0000 | INHALATION_SPRAY | Freq: Four times a day (QID) | RESPIRATORY_TRACT | 10 refills | Status: AC | PRN
  Filled 2024-02-02: qty 18, 25d supply, fill #0

## 2024-02-02 MED ORDER — ALBUTEROL SULFATE (2.5 MG/3ML) 0.083% IN NEBU
2.5000 mg | INHALATION_SOLUTION | Freq: Four times a day (QID) | RESPIRATORY_TRACT | 12 refills | Status: DC | PRN
  Filled 2024-02-02: qty 75, 7d supply, fill #0

## 2024-02-02 NOTE — Patient Instructions (Addendum)
 Dear Mr. Mauzy:  You have shortness of breath and wheezing, and I will maximize your asthma treatment with 3 therapies: -Continue the symbicort  twice a day. -I start a new inhaler, spiriva 1puff a day. -Nebulizer albuterol , you can use twice a day for a few weeks until your symptoms settle down.  For your smoking cessation, I am happy you would like to quit! Congratulations! I will start with nicotine  patch and gum to help you with the cravings. I sent a referral for the smoking clinic cessation to continue to follow up with you closely.  I recommend to do a lung cancer screening, however you declined at this time. Let me know if you change your mind at the next visit.  I will see you in 3 months.

## 2024-02-03 ENCOUNTER — Other Ambulatory Visit: Payer: Self-pay | Admitting: Surgery

## 2024-02-03 DIAGNOSIS — N186 End stage renal disease: Secondary | ICD-10-CM

## 2024-02-04 DIAGNOSIS — Z992 Dependence on renal dialysis: Secondary | ICD-10-CM | POA: Diagnosis not present

## 2024-02-04 DIAGNOSIS — N2581 Secondary hyperparathyroidism of renal origin: Secondary | ICD-10-CM | POA: Diagnosis not present

## 2024-02-04 DIAGNOSIS — N186 End stage renal disease: Secondary | ICD-10-CM | POA: Diagnosis not present

## 2024-02-08 ENCOUNTER — Observation Stay (HOSPITAL_BASED_OUTPATIENT_CLINIC_OR_DEPARTMENT_OTHER)
Admission: EM | Admit: 2024-02-08 | Discharge: 2024-02-09 | Disposition: A | Discharge status: Home / Self Care | Attending: Internal Medicine | Admitting: Internal Medicine

## 2024-02-08 ENCOUNTER — Other Ambulatory Visit: Payer: Self-pay

## 2024-02-08 ENCOUNTER — Emergency Department (HOSPITAL_BASED_OUTPATIENT_CLINIC_OR_DEPARTMENT_OTHER)

## 2024-02-08 ENCOUNTER — Encounter (HOSPITAL_BASED_OUTPATIENT_CLINIC_OR_DEPARTMENT_OTHER): Payer: Self-pay

## 2024-02-08 DIAGNOSIS — J45901 Unspecified asthma with (acute) exacerbation: Secondary | ICD-10-CM | POA: Diagnosis not present

## 2024-02-08 DIAGNOSIS — Z794 Long term (current) use of insulin: Secondary | ICD-10-CM | POA: Insufficient documentation

## 2024-02-08 DIAGNOSIS — D631 Anemia in chronic kidney disease: Secondary | ICD-10-CM | POA: Insufficient documentation

## 2024-02-08 DIAGNOSIS — D638 Anemia in other chronic diseases classified elsewhere: Secondary | ICD-10-CM | POA: Diagnosis present

## 2024-02-08 DIAGNOSIS — J441 Chronic obstructive pulmonary disease with (acute) exacerbation: Principal | ICD-10-CM | POA: Insufficient documentation

## 2024-02-08 DIAGNOSIS — E669 Obesity, unspecified: Secondary | ICD-10-CM | POA: Diagnosis not present

## 2024-02-08 DIAGNOSIS — R0602 Shortness of breath: Secondary | ICD-10-CM | POA: Diagnosis present

## 2024-02-08 DIAGNOSIS — Z7401 Bed confinement status: Secondary | ICD-10-CM | POA: Diagnosis not present

## 2024-02-08 DIAGNOSIS — I160 Hypertensive urgency: Secondary | ICD-10-CM | POA: Diagnosis not present

## 2024-02-08 DIAGNOSIS — F17210 Nicotine dependence, cigarettes, uncomplicated: Secondary | ICD-10-CM | POA: Insufficient documentation

## 2024-02-08 DIAGNOSIS — I517 Cardiomegaly: Secondary | ICD-10-CM | POA: Diagnosis not present

## 2024-02-08 DIAGNOSIS — I5033 Acute on chronic diastolic (congestive) heart failure: Secondary | ICD-10-CM | POA: Diagnosis not present

## 2024-02-08 DIAGNOSIS — I509 Heart failure, unspecified: Secondary | ICD-10-CM | POA: Diagnosis not present

## 2024-02-08 DIAGNOSIS — J9811 Atelectasis: Secondary | ICD-10-CM | POA: Insufficient documentation

## 2024-02-08 DIAGNOSIS — N186 End stage renal disease: Secondary | ICD-10-CM | POA: Insufficient documentation

## 2024-02-08 DIAGNOSIS — I10 Essential (primary) hypertension: Secondary | ICD-10-CM | POA: Diagnosis not present

## 2024-02-08 DIAGNOSIS — I132 Hypertensive heart and chronic kidney disease with heart failure and with stage 5 chronic kidney disease, or end stage renal disease: Secondary | ICD-10-CM | POA: Diagnosis not present

## 2024-02-08 DIAGNOSIS — Z992 Dependence on renal dialysis: Secondary | ICD-10-CM | POA: Insufficient documentation

## 2024-02-08 DIAGNOSIS — R7989 Other specified abnormal findings of blood chemistry: Secondary | ICD-10-CM | POA: Insufficient documentation

## 2024-02-08 DIAGNOSIS — Z720 Tobacco use: Secondary | ICD-10-CM | POA: Diagnosis present

## 2024-02-08 DIAGNOSIS — E1122 Type 2 diabetes mellitus with diabetic chronic kidney disease: Secondary | ICD-10-CM | POA: Diagnosis not present

## 2024-02-08 DIAGNOSIS — R062 Wheezing: Secondary | ICD-10-CM | POA: Diagnosis not present

## 2024-02-08 DIAGNOSIS — Z7982 Long term (current) use of aspirin: Secondary | ICD-10-CM | POA: Insufficient documentation

## 2024-02-08 DIAGNOSIS — Z79899 Other long term (current) drug therapy: Secondary | ICD-10-CM | POA: Diagnosis not present

## 2024-02-08 DIAGNOSIS — Z716 Tobacco abuse counseling: Secondary | ICD-10-CM

## 2024-02-08 DIAGNOSIS — Z6833 Body mass index (BMI) 33.0-33.9, adult: Secondary | ICD-10-CM | POA: Insufficient documentation

## 2024-02-08 DIAGNOSIS — E119 Type 2 diabetes mellitus without complications: Secondary | ICD-10-CM

## 2024-02-08 DIAGNOSIS — G4733 Obstructive sleep apnea (adult) (pediatric): Secondary | ICD-10-CM | POA: Diagnosis not present

## 2024-02-08 LAB — I-STAT VENOUS BLOOD GAS, ED
Acid-base deficit: 4 mmol/L — ABNORMAL HIGH (ref 0.0–2.0)
Bicarbonate: 22.3 mmol/L (ref 20.0–28.0)
Calcium, Ion: 1.11 mmol/L — ABNORMAL LOW (ref 1.15–1.40)
HCT: 31 % — ABNORMAL LOW (ref 39.0–52.0)
Hemoglobin: 10.5 g/dL — ABNORMAL LOW (ref 13.0–17.0)
O2 Saturation: 77 %
Patient temperature: 98.5
Potassium: 4.3 mmol/L (ref 3.5–5.1)
Sodium: 137 mmol/L (ref 135–145)
TCO2: 24 mmol/L (ref 22–32)
pCO2, Ven: 42.9 mmHg — ABNORMAL LOW (ref 44–60)
pH, Ven: 7.324 (ref 7.25–7.43)
pO2, Ven: 45 mmHg (ref 32–45)

## 2024-02-08 LAB — RESP PANEL BY RT-PCR (RSV, FLU A&B, COVID)  RVPGX2
Influenza A by PCR: NEGATIVE
Influenza B by PCR: NEGATIVE
Resp Syncytial Virus by PCR: NEGATIVE
SARS Coronavirus 2 by RT PCR: NEGATIVE

## 2024-02-08 LAB — CBC
HCT: 30.9 % — ABNORMAL LOW (ref 39.0–52.0)
Hemoglobin: 10.1 g/dL — ABNORMAL LOW (ref 13.0–17.0)
MCH: 28.1 pg (ref 26.0–34.0)
MCHC: 32.7 g/dL (ref 30.0–36.0)
MCV: 86.1 fL (ref 80.0–100.0)
Platelets: 245 K/uL (ref 150–400)
RBC: 3.59 MIL/uL — ABNORMAL LOW (ref 4.22–5.81)
RDW: 14.5 % (ref 11.5–15.5)
WBC: 9.6 K/uL (ref 4.0–10.5)
nRBC: 0 % (ref 0.0–0.2)

## 2024-02-08 LAB — COMPREHENSIVE METABOLIC PANEL WITH GFR
ALT: 10 U/L (ref 0–44)
AST: 10 U/L — ABNORMAL LOW (ref 15–41)
Albumin: 4.1 g/dL (ref 3.5–5.0)
Alkaline Phosphatase: 63 U/L (ref 38–126)
Anion gap: 18 — ABNORMAL HIGH (ref 5–15)
BUN: 88 mg/dL — ABNORMAL HIGH (ref 6–20)
CO2: 23 mmol/L (ref 22–32)
Calcium: 9.4 mg/dL (ref 8.9–10.3)
Chloride: 97 mmol/L — ABNORMAL LOW (ref 98–111)
Creatinine, Ser: 14.3 mg/dL — ABNORMAL HIGH (ref 0.61–1.24)
GFR, Estimated: 4 mL/min — ABNORMAL LOW (ref >60)
Glucose, Bld: 122 mg/dL — ABNORMAL HIGH (ref 70–99)
Potassium: 4.6 mmol/L (ref 3.5–5.1)
Sodium: 138 mmol/L (ref 135–145)
Total Bilirubin: 0.4 mg/dL (ref 0.0–1.2)
Total Protein: 7.5 g/dL (ref 6.5–8.1)

## 2024-02-08 LAB — CREATININE, SERUM
Creatinine, Ser: 15.01 mg/dL — ABNORMAL HIGH (ref 0.61–1.24)
GFR, Estimated: 3 mL/min — ABNORMAL LOW (ref >60)

## 2024-02-08 LAB — GLUCOSE, CAPILLARY
Glucose-Capillary: 259 mg/dL — ABNORMAL HIGH (ref 70–99)
Glucose-Capillary: 68 mg/dL — ABNORMAL LOW (ref 70–99)
Glucose-Capillary: 141 mg/dL — ABNORMAL HIGH (ref 70–99)

## 2024-02-08 LAB — TROPONIN T, HIGH SENSITIVITY: Troponin T High Sensitivity: 209 ng/L (ref 0–19)

## 2024-02-08 LAB — TROPONIN I (HIGH SENSITIVITY)
Troponin I (High Sensitivity): 101 ng/L (ref <18)
Troponin I (High Sensitivity): 105 ng/L (ref <18)

## 2024-02-08 LAB — PRO BRAIN NATRIURETIC PEPTIDE: Pro Brain Natriuretic Peptide: 18683 pg/mL — ABNORMAL HIGH (ref <300.0)

## 2024-02-08 MED ORDER — LABETALOL HCL 5 MG/ML IV SOLN
10.0000 mg | INTRAVENOUS | Status: DC | PRN
  Administered 2024-02-08: 10 mg via INTRAVENOUS
  Filled 2024-02-08: qty 4

## 2024-02-08 MED ORDER — DOXAZOSIN MESYLATE 8 MG PO TABS
12.0000 mg | ORAL_TABLET | Freq: Every day | ORAL | Status: DC
  Filled 2024-02-08: qty 1

## 2024-02-08 MED ORDER — ONDANSETRON HCL 4 MG PO TABS: 4.0000 mg | ORAL_TABLET | Freq: Four times a day (QID) | ORAL | Status: DC | PRN

## 2024-02-08 MED ORDER — AMLODIPINE BESYLATE 10 MG PO TABS
10.0000 mg | ORAL_TABLET | Freq: Every day | ORAL | Status: DC
  Administered 2024-02-08 – 2024-02-09 (×2): 10 mg via ORAL
  Filled 2024-02-08: qty 1
  Filled 2024-02-08: qty 2

## 2024-02-08 MED ORDER — PREDNISONE 20 MG PO TABS
40.0000 mg | ORAL_TABLET | Freq: Every day | ORAL | Status: DC
  Administered 2024-02-09: 40 mg via ORAL
  Filled 2024-02-08: qty 2

## 2024-02-08 MED ORDER — ALBUTEROL SULFATE (2.5 MG/3ML) 0.083% IN NEBU
2.5000 mg | INHALATION_SOLUTION | RESPIRATORY_TRACT | Status: AC
  Administered 2024-02-08 (×3): 2.5 mg via RESPIRATORY_TRACT
  Filled 2024-02-08 (×3): qty 3

## 2024-02-08 MED ORDER — INSULIN ASPART 100 UNIT/ML IJ SOLN
0.0000 [IU] | Freq: Three times a day (TID) | INTRAMUSCULAR | Status: DC
  Administered 2024-02-08: 11 [IU] via SUBCUTANEOUS
  Administered 2024-02-09: 3 [IU] via SUBCUTANEOUS

## 2024-02-08 MED ORDER — HEPARIN SODIUM (PORCINE) 5000 UNIT/ML IJ SOLN
5000.0000 [IU] | Freq: Three times a day (TID) | INTRAMUSCULAR | Status: DC
  Administered 2024-02-08 – 2024-02-09 (×3): 5000 [IU] via SUBCUTANEOUS
  Filled 2024-02-08 (×3): qty 1

## 2024-02-08 MED ORDER — SERTRALINE HCL 25 MG PO TABS
25.0000 mg | ORAL_TABLET | Freq: Every day | ORAL | Status: DC
  Administered 2024-02-08 – 2024-02-09 (×2): 25 mg via ORAL
  Filled 2024-02-08 (×2): qty 1

## 2024-02-08 MED ORDER — CARVEDILOL 25 MG PO TABS
25.0000 mg | ORAL_TABLET | Freq: Two times a day (BID) | ORAL | Status: DC
  Administered 2024-02-08 – 2024-02-09 (×4): 25 mg via ORAL
  Filled 2024-02-08 (×3): qty 1
  Filled 2024-02-08: qty 2

## 2024-02-08 MED ORDER — MELATONIN 5 MG PO TABS
5.0000 mg | ORAL_TABLET | Freq: Every day | ORAL | Status: DC
  Administered 2024-02-08: 5 mg via ORAL
  Filled 2024-02-08: qty 1

## 2024-02-08 MED ORDER — NITROGLYCERIN 0.4 MG SL SUBL: 0.4000 mg | SUBLINGUAL_TABLET | SUBLINGUAL | Status: DC | PRN

## 2024-02-08 MED ORDER — BASAGLAR KWIKPEN 100 UNIT/ML ~~LOC~~ SOPN: 20.0000 [IU] | PEN_INJECTOR | Freq: Every day | SUBCUTANEOUS | Status: DC

## 2024-02-08 MED ORDER — POLYETHYLENE GLYCOL 3350 17 G PO PACK: 17.0000 g | PACK | Freq: Every day | ORAL | Status: DC | PRN

## 2024-02-08 MED ORDER — HYDRALAZINE HCL 25 MG PO TABS
100.0000 mg | ORAL_TABLET | Freq: Three times a day (TID) | ORAL | Status: DC
Start: 2024-02-08 — End: 2024-02-10
  Administered 2024-02-08 – 2024-02-09 (×5): 100 mg via ORAL
  Filled 2024-02-08 (×5): qty 4

## 2024-02-08 MED ORDER — INSULIN ASPART 100 UNIT/ML IJ SOLN: 0.0000 [IU] | Freq: Three times a day (TID) | INTRAMUSCULAR | Status: DC

## 2024-02-08 MED ORDER — ACETAMINOPHEN 500 MG PO TABS: 500.0000 mg | ORAL_TABLET | Freq: Four times a day (QID) | ORAL | Status: DC | PRN

## 2024-02-08 MED ORDER — ARFORMOTEROL TARTRATE 15 MCG/2ML IN NEBU
15.0000 ug | INHALATION_SOLUTION | Freq: Two times a day (BID) | RESPIRATORY_TRACT | Status: DC
  Administered 2024-02-08 – 2024-02-09 (×2): 15 ug via RESPIRATORY_TRACT
  Filled 2024-02-08 (×3): qty 2

## 2024-02-08 MED ORDER — METHYLPREDNISOLONE SODIUM SUCC 125 MG IJ SOLR
125.0000 mg | Freq: Once | INTRAMUSCULAR | Status: AC
  Administered 2024-02-08: 125 mg via INTRAVENOUS
  Filled 2024-02-08: qty 2

## 2024-02-08 MED ORDER — ATORVASTATIN CALCIUM 80 MG PO TABS: 80.0000 mg | ORAL_TABLET | Freq: Every day | ORAL | Status: DC

## 2024-02-08 MED ORDER — FLUTICASONE FUROATE-VILANTEROL 200-25 MCG/ACT IN AEPB: 1.0000 | INHALATION_SPRAY | Freq: Every day | RESPIRATORY_TRACT | Status: DC

## 2024-02-08 MED ORDER — HYDRALAZINE HCL 20 MG/ML IJ SOLN
10.0000 mg | Freq: Once | INTRAMUSCULAR | Status: AC
  Administered 2024-02-08: 10 mg via INTRAVENOUS
  Filled 2024-02-08: qty 1

## 2024-02-08 MED ORDER — IPRATROPIUM-ALBUTEROL 0.5-2.5 (3) MG/3ML IN SOLN
3.0000 mL | Freq: Four times a day (QID) | RESPIRATORY_TRACT | Status: DC
  Administered 2024-02-08 – 2024-02-09 (×3): 3 mL via RESPIRATORY_TRACT
  Filled 2024-02-08 (×3): qty 3

## 2024-02-08 MED ORDER — ONDANSETRON HCL 4 MG/2ML IJ SOLN: 4.0000 mg | Freq: Four times a day (QID) | INTRAMUSCULAR | Status: DC | PRN

## 2024-02-08 MED ORDER — BUDESONIDE 0.5 MG/2ML IN SUSP
0.5000 mg | Freq: Two times a day (BID) | RESPIRATORY_TRACT | Status: DC
  Administered 2024-02-08 – 2024-02-09 (×2): 0.5 mg via RESPIRATORY_TRACT
  Filled 2024-02-08 (×3): qty 2

## 2024-02-08 MED ORDER — INSULIN GLARGINE 100 UNIT/ML ~~LOC~~ SOLN: 25.0000 [IU] | Freq: Every day | SUBCUTANEOUS | Status: DC

## 2024-02-08 MED ORDER — INSULIN GLARGINE 100 UNIT/ML ~~LOC~~ SOLN
20.0000 [IU] | Freq: Every day | SUBCUTANEOUS | Status: DC
  Administered 2024-02-08 – 2024-02-09 (×2): 20 [IU] via SUBCUTANEOUS
  Filled 2024-02-08 (×2): qty 0.2

## 2024-02-08 MED ORDER — NICOTINE 21 MG/24HR TD PT24
21.0000 mg | MEDICATED_PATCH | Freq: Every day | TRANSDERMAL | Status: DC
  Administered 2024-02-09: 21 mg via TRANSDERMAL
  Filled 2024-02-08: qty 1

## 2024-02-08 MED ORDER — CALCIUM ACETATE (PHOS BINDER) 667 MG PO CAPS
1334.0000 mg | ORAL_CAPSULE | Freq: Three times a day (TID) | ORAL | Status: DC
  Administered 2024-02-09 (×3): 1334 mg via ORAL
  Filled 2024-02-08 (×3): qty 2

## 2024-02-08 MED ORDER — DOXAZOSIN MESYLATE 8 MG PO TABS
8.0000 mg | ORAL_TABLET | Freq: Every day | ORAL | Status: DC
  Administered 2024-02-09: 8 mg via ORAL
  Filled 2024-02-08: qty 1

## 2024-02-08 MED ORDER — ACETAMINOPHEN 500 MG PO TABS
1000.0000 mg | ORAL_TABLET | Freq: Once | ORAL | Status: AC
Start: 2024-02-08 — End: 2024-02-08
  Administered 2024-02-08: 1000 mg via ORAL
  Filled 2024-02-08: qty 2

## 2024-02-08 MED ORDER — CALCIUM CARBONATE ANTACID 500 MG PO CHEW
1.0000 | CHEWABLE_TABLET | Freq: Three times a day (TID) | ORAL | Status: DC
  Administered 2024-02-09 (×3): 200 mg via ORAL
  Filled 2024-02-08 (×3): qty 1

## 2024-02-08 NOTE — ED Notes (Signed)
 Pt given cheerios and milk and ice water 

## 2024-02-08 NOTE — ED Notes (Addendum)
 Patient placed on 2 L at patients request. SP02 98% on RA.  Patient stuck multiple times for labs without success. Jerrol, MD made aware and is okay to hold labs at this time.

## 2024-02-08 NOTE — Plan of Care (Signed)

## 2024-02-08 NOTE — ED Triage Notes (Signed)
 Pt presents via POV c/o SOB all day. Reports no relief with at home breathing treatment. Denies hx COPD, reports possible asthma per lung MD. Pt is on HD on Tu/Thur/Sat schedule without missed apt.

## 2024-02-08 NOTE — H&P (Signed)
 History and Physical    Matthew Odom FMW:[id number] DOB: 05-24-1970 DOA: 02/08/2024  PCP: Lorren Greig PARAS, NP   Patient coming from: Home    Chief Complaint: Shortness of breath  HPI: Matthew Odom is a 53 y.o. male with medical history significant of ESRD on dialysis on TTS schedule, coronary artery  disease, Asthma, hypertension, diabetes type 2, HFpEF who presented to the emergency department at [facility] with complaint of shortness of breath.  Shortness of breath started on Sunday  but worsened today .Dyspnea without relief with home breathing treatment.  He also mentioned that he was not able to lie flat on the bed.  His last dialysis was on Saturday.  He has been compliant with his dialysis schedule. On presentation, he was severely hypertensive with systolic blood pressure in the range of 200s.  Heart rate was stable but he was mildly tachypneic.  Found to have bilateral expiratory wheezing.  Saturating 97% on room air.  Did not look volume overloaded. Patient seen and examined the bedside on the floor.  Denied any chest pain, cough, palpitations, abdominal pain, nausea, vomiting, dysuria, headache, dizziness loss of consciousness, hematochezia or melena.  He says he is feeling better after he was given breathing treatment and steroid.  He was maintaining saturation on room air.  He was speaking in full sentences.  Systolic blood pressure was in the range of 190s.   ED Course: Severely hypertensive on presentation with systolic blood pressure in the range of 200s.  EKG showed normal sinus rhythm.  Chest x-ray did not show any acute findings but showed cardiomegaly.  COVID/flu/RSV negative.  Lab work showed elevated troponin in the range of 200s, elevated proBNP in the range of 15,000, creatinine 14.3, hemoglobin 10.1.  Given IV hydralazine  for hypertension.  He denied any chest pain during the presents to the emergency department.  Since he was wheezing, he was given a dose of IV  Solu-Medrol . Patient being admitted for the management of severe hypertension, dyspnea secondary to asthma exacerbation.  Review of Systems: As per HPI otherwise 10 point review of systems negative.    Past Medical History:  Diagnosis Date   Anxiety    BPH (benign prostatic hyperplasia)    Cocaine use    07/15/21- last time was more than 10 years ago.   Complication of anesthesia    wakes up during surgery   COPD (chronic obstructive pulmonary disease) (HCC)    Coronary artery disease 2021   mild non obstructed   Diabetes mellitus without complication (HCC)    Enlarged heart    ESRD (end stage renal disease) (HCC)    TTHSAT   History of degenerative disc disease    Hyperlipidemia    Hypertension    NSTEMI (non-ST elevated myocardial infarction) (HCC)    Pneumonia    Sciatic nerve pain    Sleep apnea    Stroke (HCC)    no residual, x 3 last one was in 2020   Thoracic ascending aortic aneurysm    4.2 cm 08/03/21 CTA chest    Past Surgical History:  Procedure Laterality Date   A/V SHUNT INTERVENTION Left 11/23/2023   Procedure: A/V SHUNT INTERVENTION;  Surgeon: Magda Debby SAILOR, MD;  Location: [facility];  Service: Cardiovascular;  Laterality: Left;   A/V SHUNT INTERVENTION Left 12/25/2023   Procedure: A/V SHUNT INTERVENTION;  Surgeon: Gretta Lonni PARAS, MD;  Location: [facility];  Service: Cardiovascular;  Laterality: Left;   ANKLE ARTHROSCOPY Left  06/18/2023   Procedure: LEFT ANKLE ARTHROSCOPIC DEBRIDEMENT;  Surgeon: Elsa Lonni SAUNDERS, MD;  Location: [facility];  Service: Orthopedics;  Laterality: Left;   AV FISTULA PLACEMENT Left 07/17/2021   Procedure: CREATION  OF LEFT ARM RADIOCEPHALIC ARTERIOVENOUS (AV) FISTULA;  Surgeon: Serene Gaile ORN, MD;  Location: [facility];  Service: Vascular;  Laterality: Left;   CARDIAC CATHETERIZATION     IR FLUORO GUIDE CV LINE RIGHT  05/20/2021   IR US  GUIDE VASC ACCESS RIGHT  05/20/2021   LIGATION OF COMPETING BRANCHES OF ARTERIOVENOUS  FISTULA Left 09/13/2021   Procedure: LIGATION AND ELEVATION OF COMPETING BRANCHES OF LEFT ARM ARTERIOVENOUS FISTULA;  Surgeon: Serene Gaile ORN, MD;  Location: [facility];  Service: Vascular;  Laterality: Left;   ORIF ANKLE FRACTURE Left 06/18/2023   Procedure: OPEN TREATMENT LATERAL MALLEOLUS;  Surgeon: Elsa Lonni SAUNDERS, MD;  Location: [facility];  Service: Orthopedics;  Laterality: Left;   SYNDESMOSIS REPAIR Left 06/18/2023   Procedure: SYNDESMOSIS REPAIR;  Surgeon: Elsa Lonni SAUNDERS, MD;  Location: [facility];  Service: Orthopedics;  Laterality: Left;     reports that he has been smoking cigarettes. He has a 20 pack-year smoking history. He has never used smokeless tobacco. He reports that he does not currently use alcohol. He reports that he does not currently use drugs.  Allergies  Allergen Reactions   Aspirin  Hives   Nitroglycerin  Other (See Comments)    Nitroglycerin  paste or IV will make blood pressure go up and cause headaches. The nitroglycerin  tablets do fine.    Family History  Problem Relation Age of Onset   Heart attack Mother    Hypertension Mother    Diabetes Mother    Heart disease Sister    Hypertension Sister    Hypertension Father    Emphysema Father      Prior to Admission medications   Medication Sig Start Date End Date Taking? Authorizing Provider  acetaminophen  (TYLENOL ) 500 MG tablet Take 500-1,000 mg by mouth every 6 (six) hours as needed (pain.).    [provider]  albuterol  (PROVENTIL ) (2.5 MG/3ML) 0.083% nebulizer solution Take 3 mLs (2.5 mg total) by nebulization every 6 (six) hours as needed for wheezing or shortness of breath. 02/02/24   Adrien Winfred Berke, MD  albuterol  (PROVENTIL ) (2.5 MG/3ML) 0.083% nebulizer solution Take 3 mLs (2.5 mg total) by nebulization every 6 (six) hours as needed for wheezing or shortness of breath. 02/02/24   Adrien Winfred Berke, MD  albuterol  (VENTOLIN  HFA) 108 [phone] Base) MCG/ACT inhaler Inhale 2 puffs into the  lungs every 6 (six) hours as needed for wheezing or shortness of breath. 02/02/24   Adrien Winfred Berke, MD  amLODipine  (NORVASC ) 10 MG tablet Take 1 tablet (10 mg total) by mouth daily. 11/13/23   Lorren Greig PARAS, NP  aspirin  EC 81 MG tablet Take one tablet by mouth twice daily for 30 days after sugery for blood clot prevention. Start post op day 1. Swallow whole. 11/13/23   Lorren Greig PARAS, NP  atorvastatin  (LIPITOR) 80 MG tablet Take 1 tablet (80 mg total) by mouth daily. Patient not taking: Reported on 02/02/2024 11/13/23 02/11/24  Lorren Greig PARAS, NP  budesonide -formoterol  (SYMBICORT ) 160-4.5 MCG/ACT inhaler Inhale 2 puffs into the lungs 2 (two) times daily. 02/02/24   Adrien Winfred Berke, MD  calcium  acetate (PHOSLO ) 667 MG capsule Take 2 capsules (1,334 mg total) by mouth 3 (three) times daily with meals 06/25/21     calcium  carbonate (TUMS) 500 MG chewable tablet  Chew 1 tablet (200 mg of elemental calcium  total) by mouth 3 (three) times daily with meals. 11/13/23   Lorren Greig PARAS, NP  carvedilol  (COREG ) 25 MG tablet Take 1 tablet (25 mg total) by mouth 2 (two) times daily with a meal. 11/13/23   Lorren Greig PARAS, NP  doxazosin  (CARDURA ) 4 MG tablet Take 3 tablets (12 mg total) by mouth at bedtime. 11/13/23   Lorren Greig PARAS, NP  doxazosin  (CARDURA ) 8 MG tablet Take 1 tablet (8 mg total) by mouth daily. 12/05/22   O'NealDarryle Ned, MD  hydrALAZINE  (APRESOLINE ) 100 MG tablet Take 1 tablet (100 mg total) by mouth 3 (three) times daily. 11/13/23   Lorren Greig PARAS, NP  Insulin  Glargine (BASAGLAR  KWIKPEN) 100 UNIT/ML Inject 20 Units into the skin daily. 11/13/23   Lorren Greig PARAS, NP  insulin  lispro (HUMALOG ) 100 UNIT/ML injection Inject 0-0.04 mLs (0-4 Units total) into the skin 3 (three) times daily as needed for high blood sugar. 11/13/23   Lorren Greig PARAS, NP  Insulin  Pen Needle 31G X 6 MM MISC Use as directed in the morning, at noon, in the evening, and at bedtime. 11/13/23   Lorren Greig PARAS, NP   liraglutide  (VICTOZA ) 18 MG/3ML SOPN Inject 1.8 mg into the skin daily. 11/13/23   Lorren Greig PARAS, NP  nicotine  (NICODERM CQ  - DOSED IN MG/24 HOURS) 14 mg/24hr patch Place 1 patch (14 mg total) onto the skin daily as needed. Patient not taking: Reported on 02/02/2024 11/13/23   Lorren Greig PARAS, NP  nitroGLYCERIN  (NITROSTAT ) 0.4 MG SL tablet Place 1 tablet (0.4 mg total) under the tongue every 5 (five) minutes as needed for chest pain. 11/13/23   Lorren Greig PARAS, NP  oxyCODONE  (ROXICODONE ) 5 MG immediate release tablet Take 1 tablet (5 mg total) by mouth every 4 (four) hours as needed (for post op pain). Patient not taking: Reported on 02/02/2024 10/14/23   Yolande Lamar BROCKS, MD  sertraline  (ZOLOFT ) 25 MG tablet Take 1 tablet (25 mg total) by mouth daily. 11/13/23   Lorren Greig PARAS, NP  tiotropium (SPIRIVA) 18 MCG inhalation capsule Place 1 capsule (18 mcg total) into inhaler and inhale daily. 02/02/24   Adrien Winfred Berke, MD    Physical Exam: Vitals:   02/08/24 1350 02/08/24 1424 02/08/24 1442 02/08/24 1555  BP: (!) 209/120 (!) 209/120 (!) 189/94 (!) 192/98  Pulse:   84 87  Resp: 17  17 (!) 25  Temp:  (!) 97.5 F (36.4 C)  (!) 97.5 F (36.4 C)  TempSrc:  Oral  Oral  SpO2:   98% 98%    Constitutional: NAD, calm, comfortable, morbidly obese Vitals:   02/08/24 1350 02/08/24 1424 02/08/24 1442 02/08/24 1555  BP: (!) 209/120 (!) 209/120 (!) 189/94 (!) 192/98  Pulse:   84 87  Resp: 17  17 (!) 25  Temp:  (!) 97.5 F (36.4 C)  (!) 97.5 F (36.4 C)  TempSrc:  Oral  Oral  SpO2:   98% 98%   Eyes: PERRL, lids and conjunctivae normal ENMT: Mucous membranes are moist.  Neck: normal, supple, no masses, no thyromegaly Respiratory: Very faint bilateral expiratory wheezing, no crackles. Normal respiratory effort. No accessory muscle use.  Cardiovascular: Regular rate and rhythm, no murmurs / rubs / gallops. No extremity edema.  Abdomen: no tenderness, no masses palpated. No  hepatosplenomegaly. Bowel sounds positive.  Musculoskeletal: no clubbing / cyanosis. No joint deformity upper and lower extremities.  AV fistula on the left forearm  Skin: no rashes, lesions, ulcers. No induration Neurologic: CN 2-12 grossly intact.  Strength 5/5 in all 4.  Psychiatric: Normal judgment and insight. Alert and oriented x 3. Normal mood.   Foley Catheter:None  Labs on Admission: I have personally reviewed following labs and imaging studies  CBC: Recent Labs  Lab 02/08/24 0249 02/08/24 0305  WBC 9.6  --   HGB 10.1* 10.5*  HCT 30.9* 31.0*  MCV 86.1  --   PLT 245  --    Basic Metabolic Panel: Recent Labs  Lab 02/08/24 0249 02/08/24 0305  NA 138 137  K 4.6 4.3  CL 97*  --   CO2 23  --   GLUCOSE 122*  --   BUN 88*  --   CREATININE 14.30*  --   CALCIUM  9.4  --    GFR: Estimated Creatinine Clearance: 7.6 mL/min (A) (by C-G formula based on SCr of 14.3 mg/dL (H)). Liver Function Tests: Recent Labs  Lab 02/08/24 0249  AST 10*  ALT 10  ALKPHOS 63  BILITOT 0.4  PROT 7.5  ALBUMIN 4.1   No results for input(s): LIPASE, AMYLASE in the last 168 hours. No results for input(s): AMMONIA in the last 168 hours. Coagulation Profile: No results for input(s): INR, PROTIME in the last 168 hours. Cardiac Enzymes: No results for input(s): CKTOTAL, CKMB, CKMBINDEX, TROPONINI in the last 168 hours. BNP (last 3 results) Recent Labs    11/08/23 1947 02/08/24 0249  PROBNP 13,877.0* 18,683.0*   HbA1C: No results for input(s): HGBA1C in the last 72 hours. CBG: Recent Labs  Lab 02/08/24 1602  GLUCAP 259*   Lipid Profile: No results for input(s): CHOL, HDL, LDLCALC, TRIG, CHOLHDL, LDLDIRECT in the last 72 hours. Thyroid  Function Tests: No results for input(s): TSH, T4TOTAL, FREET4, T3FREE, THYROIDAB in the last 72 hours. Anemia Panel: No results for input(s): VITAMINB12, FOLATE, FERRITIN, TIBC, IRON, RETICCTPCT  in the last 72 hours. Urine analysis:    Component Value Date/Time   COLORURINE YELLOW 05/07/2021 1417   APPEARANCEUR CLEAR 05/07/2021 1417   LABSPEC 1.014 05/07/2021 1417   PHURINE 6.5 05/07/2021 1417   GLUCOSEU >1,000 (A) 05/07/2021 1417   HGBUR MODERATE (A) 05/07/2021 1417   BILIRUBINUR NEGATIVE 05/07/2021 1417   BILIRUBINUR negative 11/07/2020 1717   KETONESUR NEGATIVE 05/07/2021 1417   PROTEINUR >300 (A) 05/07/2021 1417   UROBILINOGEN 0.2 11/07/2020 1717   NITRITE NEGATIVE 05/07/2021 1417   LEUKOCYTESUR NEGATIVE 05/07/2021 1417    Radiological Exams on Admission: DG Chest Port 1 View Result Date: 02/08/2024 EXAM: 1 VIEW XRAY OF THE CHEST 02/08/2024 03:11:00 AM COMPARISON: 11/09/2023 CLINICAL HISTORY: Shortness of breath. c/o SOB all day. Reports no relief with at home breathing treatment. Denies hx COPD, reports possible asthma per lung MD. FINDINGS: LUNGS AND PLEURA: No focal pulmonary opacity. No pulmonary edema. No pleural effusion. No pneumothorax. HEART AND MEDIASTINUM: Cardiomegaly, unchanged. BONES AND SOFT TISSUES: No acute osseous abnormality. IMPRESSION: 1. No acute findings. 2. Cardiomegaly, unchanged. Electronically signed by: Dorethia Molt MD 02/08/2024 03:23 AM EDT RP Workstation: [id number]     Assessment/Plan Principal Problem:   Hypertensive urgency Active Problems:   ESRD on dialysis (HCC)   Nicotine  abuse   Elevated troponin   Type 2 diabetes mellitus, with long-term current use of insulin  (HCC)   Anemia of chronic disease   Acute on chronic heart failure with preserved ejection fraction (HFpEF) (HCC)   Hypertensive urgency: Presented with severe hypertension with blood pressure in the range of 200s systolic.  Patient reports compliance with his meds.  Will restart his home medications.  Continue IV labetalol  as needed for severe hypertension.  Hopefully his blood pressure will improve after dialysis.  Takes amlodipine , hydralazine , Coreg , doxazosin  at  home.  May need to add clonidine  if blood pressure consistently remains high even after dialysis  Acute exacerbation of asthma: Takes Symbicort , Spiriva, albuterol  as needed.  Follows with pulmonology.  Found to have expiratory wheezing bilaterally on presentation.  Maintaining saturation on room air.  Chest did not show any acute findings. Given a dose of 125 mg of Solu-Medrol  today.  Can continue prednisone  from tomorrow.  ESRD on dialysis: Dialyzed on TTS schedule.  Last dialysis on Saturday.  Patient compliant with dialysis schedule.  Will consult nephrology for dialysis.  Message sent to Dr. Gearline.  He has AV fistula on the left forearm  HFpEF: Does not look volume overloaded on presentation.  No lower extremity edema.  But he complained of orthopnea.  Last echo done on 12/25/2022 showed EF of 55 to 60%, indeterminate diastolic parameters.  Elevated proBNP.  Volume management as per dialysis  Type 2 diabetes: Takes insulin  at home.  Continue current insulin  regimen.  Monitor blood sugars.  Blood sugars might spike while on steroids.  History of OSA: He is his CPAP machine mask does not fit.  He has been noncompliant.  We recommend to follow-up with his pulmonologist and get new CPAP machine/mask  History of tobacco use:  Has been smoking a pack of cigarettes for last 40 years but recently decreased to half a pack.  Counseled for cessation.  Elevated troponin: No chest pain.  This is most likely secondary to demand ischemia from severe hypertension/ESRD.  Continue to cycle troponin.  Insomnia: Complains of lack of sleep.  Will try melatonin.  It could be also from his snoring from sleep apnea.  Obesity: BMI  of 33.6    Severity of Illness: The appropriate patient status for this patient is OBSERVATION. Observation status is judged to be reasonable and necessary in order to provide the required intensity of service to ensure the patient's safety. The patient's presenting symptoms, physical  exam findings, and initial radiographic and laboratory data in the context of their medical condition is felt to place them at decreased risk for further clinical deterioration. Furthermore, it is anticipated that the patient will be medically stable for discharge from the hospital within 2 midnights of admission.    DVT prophylaxis: Hep [place] Code Status: Full Family Communication: Wife at bedside Consults called: Nephrology     Ivonne Mustache MD [facility]  02/08/2024, 4:21 PM

## 2024-02-08 NOTE — ED Provider Notes (Signed)
 [facility] AT [facility] Provider Note   CSN: [id number] Arrival date & time: 02/08/24  0236     Patient presents with: Shortness of Breath   Matthew Odom is a 53 y.o. male.    Shortness of Breath    53 year old male with medical history significant for CAD, ESRD on HD (Tu Th Sat), COPD, HTN, DM 2, CHF presenting to the emergency department with shortness of breath.  The patient states that he has had difficulty catching his breath all day.  He has tried breathing treatments at home without relief.  He denies any chest pain or pressure.  No fever or chills.  No cough.  He last underwent dialysis on Saturday.  Prior to Admission medications   Medication Sig Start Date End Date Taking? Authorizing Provider  acetaminophen  (TYLENOL ) 500 MG tablet Take 500-1,000 mg by mouth every 6 (six) hours as needed (pain.).    [provider]  albuterol  (PROVENTIL ) (2.5 MG/3ML) 0.083% nebulizer solution Take 3 mLs (2.5 mg total) by nebulization every 6 (six) hours as needed for wheezing or shortness of breath. 02/02/24   Adrien Winfred Berke, MD  albuterol  (PROVENTIL ) (2.5 MG/3ML) 0.083% nebulizer solution Take 3 mLs (2.5 mg total) by nebulization every 6 (six) hours as needed for wheezing or shortness of breath. 02/02/24   Adrien Winfred Berke, MD  albuterol  (VENTOLIN  HFA) 108 [phone] Base) MCG/ACT inhaler Inhale 2 puffs into the lungs every 6 (six) hours as needed for wheezing or shortness of breath. 02/02/24   Adrien Winfred Berke, MD  amLODipine  (NORVASC ) 10 MG tablet Take 1 tablet (10 mg total) by mouth daily. 11/13/23   Lorren Greig PARAS, NP  aspirin  EC 81 MG tablet Take one tablet by mouth twice daily for 30 days after sugery for blood clot prevention. Start post op day 1. Swallow whole. 11/13/23   Lorren Greig PARAS, NP  atorvastatin  (LIPITOR) 80 MG tablet Take 1 tablet (80 mg total) by mouth daily. Patient not taking: Reported on 02/02/2024 11/13/23 02/11/24   Lorren Greig PARAS, NP  budesonide -formoterol  (SYMBICORT ) 160-4.5 MCG/ACT inhaler Inhale 2 puffs into the lungs 2 (two) times daily. 02/02/24   Adrien Winfred Berke, MD  calcium  acetate (PHOSLO ) 667 MG capsule Take 2 capsules (1,334 mg total) by mouth 3 (three) times daily with meals 06/25/21     calcium  carbonate (TUMS) 500 MG chewable tablet Chew 1 tablet (200 mg of elemental calcium  total) by mouth 3 (three) times daily with meals. 11/13/23   Lorren Greig PARAS, NP  carvedilol  (COREG ) 25 MG tablet Take 1 tablet (25 mg total) by mouth 2 (two) times daily with a meal. 11/13/23   Lorren Greig PARAS, NP  doxazosin  (CARDURA ) 4 MG tablet Take 3 tablets (12 mg total) by mouth at bedtime. 11/13/23   Lorren Greig PARAS, NP  doxazosin  (CARDURA ) 8 MG tablet Take 1 tablet (8 mg total) by mouth daily. 12/05/22   O'NealDarryle Ned, MD  hydrALAZINE  (APRESOLINE ) 100 MG tablet Take 1 tablet (100 mg total) by mouth 3 (three) times daily. 11/13/23   Lorren Greig PARAS, NP  Insulin  Glargine (BASAGLAR  KWIKPEN) 100 UNIT/ML Inject 20 Units into the skin daily. 11/13/23   Lorren Greig PARAS, NP  insulin  lispro (HUMALOG ) 100 UNIT/ML injection Inject 0-0.04 mLs (0-4 Units total) into the skin 3 (three) times daily as needed for high blood sugar. 11/13/23   Lorren Greig PARAS, NP  Insulin  Pen Needle 31G X 6 MM MISC Use as directed in  the morning, at noon, in the evening, and at bedtime. 11/13/23   Lorren Greig PARAS, NP  liraglutide  (VICTOZA ) 18 MG/3ML SOPN Inject 1.8 mg into the skin daily. 11/13/23   Lorren Greig PARAS, NP  nicotine  (NICODERM CQ  - DOSED IN MG/24 HOURS) 14 mg/24hr patch Place 1 patch (14 mg total) onto the skin daily as needed. Patient not taking: Reported on 02/02/2024 11/13/23   Lorren Greig PARAS, NP  nitroGLYCERIN  (NITROSTAT ) 0.4 MG SL tablet Place 1 tablet (0.4 mg total) under the tongue every 5 (five) minutes as needed for chest pain. 11/13/23   Lorren Greig PARAS, NP  oxyCODONE  (ROXICODONE ) 5 MG immediate release tablet Take 1 tablet (5 mg  total) by mouth every 4 (four) hours as needed (for post op pain). Patient not taking: Reported on 02/02/2024 10/14/23   Yolande Lamar BROCKS, MD  sertraline  (ZOLOFT ) 25 MG tablet Take 1 tablet (25 mg total) by mouth daily. 11/13/23   Lorren Greig PARAS, NP  tiotropium (SPIRIVA) 18 MCG inhalation capsule Place 1 capsule (18 mcg total) into inhaler and inhale daily. 02/02/24   Adrien Winfred Berke, MD    Allergies: Aspirin  and Nitroglycerin     Review of Systems  Respiratory:  Positive for shortness of breath.   All other systems reviewed and are negative.   Updated Vital Signs BP (!) 190/112   Pulse 87   Temp 98.5 F (36.9 C) (Oral)   Resp 15   SpO2 97%   Physical Exam Vitals and nursing note reviewed.  Constitutional:      General: He is not in acute distress.    Appearance: He is well-developed. He is obese.  HENT:     Head: Normocephalic and atraumatic.  Eyes:     Conjunctiva/sclera: Conjunctivae normal.  Cardiovascular:     Rate and Rhythm: Normal rate and regular rhythm.     Heart sounds: No murmur heard. Pulmonary:     Effort: Pulmonary effort is normal. Tachypnea present. No respiratory distress.     Breath sounds: Wheezing present.  Abdominal:     Palpations: Abdomen is soft.     Tenderness: There is no abdominal tenderness.  Musculoskeletal:        General: No swelling.     Cervical back: Neck supple.  Skin:    General: Skin is warm and dry.     Capillary Refill: Capillary refill takes less than 2 seconds.  Neurological:     Mental Status: He is alert.  Psychiatric:        Mood and Affect: Mood normal.     (all labs ordered are listed, but only abnormal results are displayed) Labs Reviewed  CBC - Abnormal; Notable for the following components:      Result Value   RBC 3.59 (*)    Hemoglobin 10.1 (*)    HCT 30.9 (*)    All other components within normal limits  PRO BRAIN NATRIURETIC PEPTIDE - Abnormal; Notable for the following components:   Pro Brain  Natriuretic Peptide 18,683.0 (*)    All other components within normal limits  COMPREHENSIVE METABOLIC PANEL WITH GFR - Abnormal; Notable for the following components:   Chloride 97 (*)    Glucose, Bld 122 (*)    BUN 88 (*)    Creatinine, Ser 14.30 (*)    AST 10 (*)    GFR, Estimated 4 (*)    Anion gap 18 (*)    All other components within normal limits  I-STAT VENOUS BLOOD GAS, ED -  Abnormal; Notable for the following components:   pCO2, Ven 42.9 (*)    Acid-base deficit 4.0 (*)    Calcium , Ion 1.11 (*)    HCT 31.0 (*)    Hemoglobin 10.5 (*)    All other components within normal limits  TROPONIN T, HIGH SENSITIVITY - Abnormal; Notable for the following components:   Troponin T High Sensitivity 209 (*)    All other components within normal limits  RESP PANEL BY RT-PCR (RSV, FLU A&B, COVID)  [id number]  TROPONIN T, HIGH SENSITIVITY    EKG: None  Radiology: DG Chest Port 1 View Result Date: 02/08/2024 EXAM: 1 VIEW XRAY OF THE CHEST 02/08/2024 03:11:00 AM COMPARISON: 11/09/2023 CLINICAL HISTORY: Shortness of breath. c/o SOB all day. Reports no relief with at home breathing treatment. Denies hx COPD, reports possible asthma per lung MD. FINDINGS: LUNGS AND PLEURA: No focal pulmonary opacity. No pulmonary edema. No pleural effusion. No pneumothorax. HEART AND MEDIASTINUM: Cardiomegaly, unchanged. BONES AND SOFT TISSUES: No acute osseous abnormality. IMPRESSION: 1. No acute findings. 2. Cardiomegaly, unchanged. Electronically signed by: Dorethia Molt MD 02/08/2024 03:23 AM EDT RP Workstation: [id number]     Procedures   Medications Ordered in the ED  albuterol  (PROVENTIL ) (2.5 MG/3ML) 0.083% nebulizer solution 2.5 mg (2.5 mg Nebulization Given 02/08/24 0311)  methylPREDNISolone  sodium succinate (SOLU-MEDROL ) 125 mg/2 mL injection 125 mg (125 mg Intravenous Given 02/08/24 0255)  hydrALAZINE  (APRESOLINE ) injection 10 mg (10 mg Intravenous Given 02/08/24 0350)    Clinical Course as of  02/08/24 0503  Mon Feb 08, 2024  0340 Pro Brain Natriuretic Peptide(!): 81,316.9 [JL]    Clinical Course User Index [JL] Jerrol Agent, MD                                 Medical Decision Making Amount and/or Complexity of Data Reviewed Labs: ordered. Decision-making details documented in ED Course. Radiology: ordered.  Risk Prescription drug management. Decision regarding hospitalization.    53 year old male with medical history significant for CAD, ESRD on HD (Tu Th Sat), COPD, HTN, DM 2, CHF presenting to the emergency department with shortness of breath.  The patient states that he has had difficulty catching his breath all day.  He has tried breathing treatments at home without relief.  He denies any chest pain or pressure.  No fever or chills.  No cough.  He last underwent dialysis on Saturday.  On arrival, the patient was afebrile, not tachycardic, mildly tachypneic, saturating 97% on room air, hypertensive BP 209/112.  On exam the patient had expiratory wheezing throughout the lungs consistent with likely COPD exacerbation.  Patient has been having persistent symptoms despite home nebulizer treatments.  Additionally has a history of ESRD and CHF, no crackles appreciated on exam.  Patient denies any chest pain, endorses shortness of breath.  Differential diagnose includes COPD exacerbation, CHF/volume overload, ACS, less likely PE.  Considered viral infection, pneumothorax, pneumonia.  IV access was obtained and the patient was administered IV Solu-Medrol  in addition to multiple nebulizer treatments.  EKG: Sinus rhythm, nonspecific ST changes similar to prior EKGs, no STEMI  CXR: No acute findings, cardiomegaly present.  Labs: Cardiac troponin 209, proBNP elevated at 18,683 (patient has ESRD and CHF), VBG with a pH of 7.32, pCO2 43, HCO3 22, CMP with elevated BUN/creatinine at 88 and 14.3, CBC without a leukocytosis, mild anemia to 10.1, repeat troponin pending.  Patient  persistently hypertensive with blood pressures  201/104, 190/112, administered 10 mg of IV hydralazine .  Patient denies chest pain on repeat evaluation, still persistently endorses dyspnea, still with some persistent wheezing.  Considered COPD exacerbation requiring inpatient observation and further management versus component of hypertensive emergency.  Lower concern for ACS, patient without pulmonary edema, without hypoxic respiratory failure, no need for emergent dialysis at this time.  Given persistent symptoms, medicine was consulted for admission for continued management, Dr. Franky was consulted and accepted the patient in admission.     Final diagnoses:  COPD exacerbation (HCC)  Hypertension, unspecified type  ESRD (end stage renal disease) on dialysis (HCC)  Congestive heart failure, unspecified HF chronicity, unspecified heart failure type (HCC)  Elevated troponin    ED Discharge Orders     None          Jerrol Agent, MD 02/08/24 [phone]

## 2024-02-09 ENCOUNTER — Other Ambulatory Visit (HOSPITAL_COMMUNITY): Payer: Self-pay

## 2024-02-09 ENCOUNTER — Observation Stay (HOSPITAL_COMMUNITY)

## 2024-02-09 ENCOUNTER — Encounter (HOSPITAL_COMMUNITY): Payer: Self-pay | Admitting: Vascular Surgery

## 2024-02-09 DIAGNOSIS — I120 Hypertensive chronic kidney disease with stage 5 chronic kidney disease or end stage renal disease: Secondary | ICD-10-CM | POA: Diagnosis not present

## 2024-02-09 DIAGNOSIS — R0602 Shortness of breath: Secondary | ICD-10-CM | POA: Diagnosis not present

## 2024-02-09 DIAGNOSIS — J9811 Atelectasis: Secondary | ICD-10-CM | POA: Diagnosis not present

## 2024-02-09 DIAGNOSIS — I160 Hypertensive urgency: Secondary | ICD-10-CM | POA: Diagnosis not present

## 2024-02-09 DIAGNOSIS — J449 Chronic obstructive pulmonary disease, unspecified: Secondary | ICD-10-CM | POA: Diagnosis not present

## 2024-02-09 LAB — BASIC METABOLIC PANEL WITH GFR
Anion gap: 21 — ABNORMAL HIGH (ref 5–15)
BUN: 125 mg/dL — ABNORMAL HIGH (ref 6–20)
CO2: 17 mmol/L — ABNORMAL LOW (ref 22–32)
Calcium: 8.3 mg/dL — ABNORMAL LOW (ref 8.9–10.3)
Chloride: 98 mmol/L (ref 98–111)
Creatinine, Ser: 15.42 mg/dL — ABNORMAL HIGH (ref 0.61–1.24)
GFR, Estimated: 3 mL/min — ABNORMAL LOW (ref >60)
Glucose, Bld: 121 mg/dL — ABNORMAL HIGH (ref 70–99)
Potassium: 4.6 mmol/L (ref 3.5–5.1)
Sodium: 136 mmol/L (ref 135–145)

## 2024-02-09 LAB — CBC
HCT: 29.6 % — ABNORMAL LOW (ref 39.0–52.0)
Hemoglobin: 9.5 g/dL — ABNORMAL LOW (ref 13.0–17.0)
MCH: 27.9 pg (ref 26.0–34.0)
MCHC: 32.1 g/dL (ref 30.0–36.0)
MCV: 86.8 fL (ref 80.0–100.0)
Platelets: 211 K/uL (ref 150–400)
RBC: 3.41 MIL/uL — ABNORMAL LOW (ref 4.22–5.81)
RDW: 14.5 % (ref 11.5–15.5)
WBC: 9.6 K/uL (ref 4.0–10.5)
nRBC: 0 % (ref 0.0–0.2)

## 2024-02-09 LAB — GLUCOSE, CAPILLARY
Glucose-Capillary: 141 mg/dL — ABNORMAL HIGH (ref 70–99)
Glucose-Capillary: 67 mg/dL — ABNORMAL LOW (ref 70–99)
Glucose-Capillary: 70 mg/dL (ref 70–99)
Glucose-Capillary: 118 mg/dL — ABNORMAL HIGH (ref 70–99)

## 2024-02-09 LAB — HEPATITIS B SURFACE ANTIGEN: Hepatitis B Surface Ag: NONREACTIVE

## 2024-02-09 MED ORDER — PENTAFLUOROPROP-TETRAFLUOROETH EX AERO: 1.0000 | INHALATION_SPRAY | CUTANEOUS | Status: DC | PRN

## 2024-02-09 MED ORDER — LIDOCAINE HCL (PF) 1 % IJ SOLN: 5.0000 mL | INTRAMUSCULAR | Status: DC | PRN

## 2024-02-09 MED ORDER — IPRATROPIUM-ALBUTEROL 0.5-2.5 (3) MG/3ML IN SOLN
3.0000 mL | Freq: Two times a day (BID) | RESPIRATORY_TRACT | Status: DC
  Filled 2024-02-09: qty 3

## 2024-02-09 MED ORDER — CLONIDINE HCL 0.1 MG PO TABS
0.1000 mg | ORAL_TABLET | Freq: Three times a day (TID) | ORAL | 0 refills | Status: DC
  Filled 2024-02-09: qty 90, 30d supply, fill #0

## 2024-02-09 MED ORDER — ALTEPLASE 2 MG IJ SOLR: 2.0000 mg | Freq: Once | INTRAMUSCULAR | Status: DC | PRN

## 2024-02-09 MED ORDER — HEPARIN SODIUM (PORCINE) 1000 UNIT/ML IJ SOLN
INTRAMUSCULAR | Status: AC
  Filled 2024-02-09: qty 2

## 2024-02-09 MED ORDER — CHLORHEXIDINE GLUCONATE CLOTH 2 % EX PADS
6.0000 | MEDICATED_PAD | Freq: Every day | CUTANEOUS | Status: DC
  Administered 2024-02-09: 6 via TOPICAL

## 2024-02-09 MED ORDER — HEPARIN SODIUM (PORCINE) 1000 UNIT/ML DIALYSIS: 2000.0000 [IU] | Freq: Once | INTRAMUSCULAR | Status: DC

## 2024-02-09 MED ORDER — ANTICOAGULANT SODIUM CITRATE 4% (200MG/5ML) IV SOLN
5.0000 mL | Status: DC | PRN
  Filled 2024-02-09: qty 5

## 2024-02-09 MED ORDER — CLONIDINE HCL 0.1 MG PO TABS
0.1000 mg | ORAL_TABLET | Freq: Three times a day (TID) | ORAL | Status: DC
  Administered 2024-02-09 (×2): 0.1 mg via ORAL
  Filled 2024-02-09 (×2): qty 1

## 2024-02-09 MED ORDER — METHYLPREDNISOLONE 4 MG PO TBPK
ORAL_TABLET | ORAL | 0 refills | Status: DC
Start: 2024-02-09 — End: 2024-03-18
  Filled 2024-02-09: qty 21, 6d supply, fill #0

## 2024-02-09 MED ORDER — LIDOCAINE-PRILOCAINE 2.5-2.5 % EX CREA: 1.0000 | TOPICAL_CREAM | CUTANEOUS | Status: DC | PRN

## 2024-02-09 MED ORDER — HEPARIN SODIUM (PORCINE) 1000 UNIT/ML DIALYSIS: 1000.0000 [IU] | INTRAMUSCULAR | Status: DC | PRN

## 2024-02-09 NOTE — Discharge Instructions (Addendum)
 Follow with Primary MD Lorren Greig PARAS, NP in 7 days   Get CBC, CMP, Magnesium , 2 view Chest X ray -  checked next visit with your primary MD   Activity: As tolerated with Full fall precautions use walker/cane & assistance as needed  Disposition Home     Diet: Renal-low carbohydrate diet, strict 1.2 L fluid restriction per day.  Check CBGs q. ACHS.  Special Instructions: If you have smoked or chewed Tobacco  in the last 2 yrs please stop smoking, stop any regular Alcohol  and or any Recreational drug use.  On your next visit with your primary care physician please Get Medicines reviewed and adjusted.  Please request your Prim.MD to go over all Hospital Tests and Procedure/Radiological results at the follow up, please get all Hospital records sent to your Prim MD by signing hospital release before you go home.  If you experience worsening of your admission symptoms, develop shortness of breath, life threatening emergency, suicidal or homicidal thoughts you must seek medical attention immediately by calling 911 or calling your MD immediately  if symptoms less severe.  You Must read complete instructions/literature along with all the possible adverse reactions/side effects for all the Medicines you take and that have been prescribed to you. Take any new Medicines after you have completely understood and accpet all the possible adverse reactions/side effects.   Do not drive when taking Pain medications.  Do not take more than prescribed Pain, Sleep and Anxiety Medications  Wear Seat belts while driving.

## 2024-02-09 NOTE — Hospital Course (Signed)
 53 year old black male

## 2024-02-09 NOTE — Progress Notes (Addendum)
 [facility] CALL  Unable to speak to patient as he was hospitalized on [facility] and then in dialysis. Spoke to his [name], [name], on the phone while she was at work but the audio quality was very poor and she was unable to leave her post to speak with me. She did acknowledge that the patient needs to check in at the admitting office at [facility] at 12:30 pm tomorrow,take his blood pressure medicine,1/2 dose of his insulin  and nothing to eat or drink after midnight. I gave her the short stay phone number [phone] to call in the morning if patient was late,sick or having any problems getting to the hospital. She thanked me. I took a printed copy of pre op instructions to [facility]. Gave them to Delcine Johnson, RN and asked her to include them in patient's discharge instructions. Please ask discharge RN to tell patient that pre op instructions are in his discharge packet. Delcine stated that should would do so.   PCP - Lorren Greig PARAS, NP Cardiologist - O'Neal, Darryle Ned, MD  PPM/ICD - denied Device Orders -  Rep Notified -   Chest x-ray - 02/08/24 EKG - 02/08/24 Stress Test -  ECHO - 12/25/22 Cardiac Cath - 02/15/16 CE  Sleep Study - 10/2021 CPAP - could not tolerate  Fasting Blood Sugar - pt's wife was not sure what patient's blood sugars are when he is fasting.  Checks Blood Sugar _____ times a day- pt's wife states pt's blood sugar is checked at dialysis  Blood Thinner Instructions:na Aspirin  Instructions:continue per Dr. Cleda instructions  ERAS Protcol -NPO PRE-SURGERY Ensure or G2-   COVID TEST- na   Anesthesia review: yes- hx HTN,NSTEMI,COPD,ESRD on dialysis,DM,CAD,smoker,hospitalized 10/6-10/7 for hypertensive urgency,acute exacerbation of asthma

## 2024-02-09 NOTE — Plan of Care (Signed)

## 2024-02-09 NOTE — Discharge Summary (Signed)
 Matthew Odom FMW:[id number] DOB: 29-Jul-1970 DOA: 02/08/2024  PCP: Lorren Greig PARAS, NP  Admit date: 02/08/2024  Discharge date: 02/09/2024  Admitted From: Home   Disposition:  Home   Recommendations for Outpatient Follow-up:   Follow up with PCP in 1-2 weeks  PCP Please obtain BMP/CBC, 2 view CXR in 1week,  (see Discharge instructions)   PCP Please follow up on the following pending results:   kindly arrange for outpatient pulmonary follow-up in 1 week.   Home Health: None   Equipment/Devices: None  Consultations: None  Discharge Condition: Stable    CODE STATUS: Full    Diet Recommendation: Renal-low carbohydrate diet with 1.2 L fluid restriction.  Check CBGs q. ACHS     Chief Complaint  Patient presents with   Shortness of Breath     Brief history of present illness from the day of admission and additional interim summary    53 y.o. male with medical history significant of ESRD on dialysis on TTS schedule, coronary artery  disease, Asthma, hypertension, diabetes type 2, HFpEF who presented to the emergency department at [facility] with complaint of shortness of breath.  Shortness of breath started on Sunday  but worsened today .Dyspnea without relief with home breathing treatment.  He also mentioned that he was not able to lie flat on the bed.  His last dialysis was on Saturday.  He has been compliant with his dialysis schedule.  On presentation, he was severely hypertensive with systolic blood pressure in the range of 200s.  Heart rate was stable but he was mildly tachypneic.  Found to have bilateral expiratory wheezing.  Saturating 97% on room air.  Did not look volume overloaded.  He was diagnosed with asthma exacerbation placed on steroids and admitted to the hospital.  Respiratory viral panel including COVID  19 testing was unremarkable                                                                 Hospital Course   Hypertensive urgency: Presented with severe hypertension with blood pressure in the range of 200s systolic.  Patient reports compliance with his meds, likely due to respiratory distress, this has improved significantly, home medications continued Catapres  also added for better control.  PCP to monitor and adjust.   Acute exacerbation of asthma: Takes Symbicort , Spiriva, albuterol  as needed.  Follows with pulmonology.  Almost completely resolved after high-dose steroids in the ER, no wheezing or oxygen requirement, placed on Medrol  Dosepak follow-up with PCP and pulmonology postdischarge.   ESRD on dialysis: Dialyzed on TTS schedule.  Planned, last dialysis on Saturday and next dialysis will be today and then will be discharged home.   HFpEF: Does not look volume overloaded on presentation.  No lower extremity edema.  EF 65% on recent echocardiogram  1 year ago, currently back to baseline without HD being done, HD to be done today prior to discharge   Type 2 diabetes: New home regimen requested to check CBGs q. ACHS and adjust sliding scale if needed for brief steroid use.   History of OSA: He is his CPAP machine mask does not fit.  He has been noncompliant.  Unseld on compliance and requested to follow-up with his pulmonologist within a week.   History of tobacco use:  Has been smoking a pack of cigarettes for last 40 years but recently decreased to half a pack.  Counseled for cessation.   Elevated troponin: No chest pain.  This is most likely secondary to demand ischemia from severe hypertension/ESRD.  Trend stable and flat.  In non-ACS pattern.   Insomnia: Stable with melatonin requested to take over-the-counter if needed.   Obesity: BMI  of 33.6, follow-up with PCP    Discharge diagnosis     Principal Problem:   Hypertensive urgency Active Problems:   ESRD on dialysis (HCC)    Nicotine  abuse   Elevated troponin   Type 2 diabetes mellitus, with long-term current use of insulin  (HCC)   Anemia of chronic disease   Acute on chronic heart failure with preserved ejection fraction (HFpEF) [facility])    Discharge instructions    Discharge Instructions     Discharge instructions   Complete by: As directed    Follow with Primary MD Lorren Greig PARAS, NP in 7 days   Get CBC, CMP, Magnesium , 2 view Chest X ray -  checked next visit with your primary MD   Activity: As tolerated with Full fall precautions use walker/cane & assistance as needed  Disposition Home     Diet: Renal-low carbohydrate diet, strict 1.2 L fluid restriction per day.  Check CBGs q. ACHS.  Special Instructions: If you have smoked or chewed Tobacco  in the last 2 yrs please stop smoking, stop any regular Alcohol  and or any Recreational drug use.  On your next visit with your primary care physician please Get Medicines reviewed and adjusted.  Please request your Prim.MD to go over all Hospital Tests and Procedure/Radiological results at the follow up, please get all Hospital records sent to your Prim MD by signing hospital release before you go home.  If you experience worsening of your admission symptoms, develop shortness of breath, life threatening emergency, suicidal or homicidal thoughts you must seek medical attention immediately by calling 911 or calling your MD immediately  if symptoms less severe.  You Must read complete instructions/literature along with all the possible adverse reactions/side effects for all the Medicines you take and that have been prescribed to you. Take any new Medicines after you have completely understood and accpet all the possible adverse reactions/side effects.   Do not drive when taking Pain medications.  Do not take more than prescribed Pain, Sleep and Anxiety Medications  Wear Seat belts while driving.   Increase activity slowly   Complete by: As directed         Discharge Medications   Allergies as of 02/09/2024       Reactions   Aspirin  Hives   Nitroglycerin  Other (See Comments)   Nitroglycerin  paste or IV will make blood pressure go up and cause headaches. The nitroglycerin  tablets do fine.        Medication List     STOP taking these medications    nicotine  14 mg/24hr patch Commonly known as: NICODERM CQ  - dosed  in mg/24 hours   oxyCODONE  5 MG immediate release tablet Commonly known as: Roxicodone        TAKE these medications    acetaminophen  500 MG tablet Commonly known as: TYLENOL  Take 500-1,000 mg by mouth every 6 (six) hours as needed (pain.).   albuterol  (2.5 MG/3ML) 0.083% nebulizer solution Commonly known as: PROVENTIL  Take 3 mLs (2.5 mg total) by nebulization every 6 (six) hours as needed for wheezing or shortness of breath.   albuterol  (2.5 MG/3ML) 0.083% nebulizer solution Commonly known as: PROVENTIL  Take 3 mLs (2.5 mg total) by nebulization every 6 (six) hours as needed for wheezing or shortness of breath.   albuterol  108 (90 Base) MCG/ACT inhaler Commonly known as: VENTOLIN  HFA Inhale 2 puffs into the lungs every 6 (six) hours as needed for wheezing or shortness of breath.   amLODipine  10 MG tablet Commonly known as: NORVASC  Take 1 tablet (10 mg total) by mouth daily.   aspirin  EC 81 MG tablet Take one tablet by mouth twice daily for 30 days after sugery for blood clot prevention. Start post op day 1. Swallow whole.   atorvastatin  80 MG tablet Commonly known as: LIPITOR Take 1 tablet (80 mg total) by mouth daily.   Basaglar  KwikPen 100 UNIT/ML Inject 20 Units into the skin daily.   calcium  acetate 667 MG capsule Commonly known as: PHOSLO  Take 2 capsules (1,334 mg total) by mouth 3 (three) times daily with meals   calcium  carbonate 500 MG chewable tablet Commonly known as: Tums Chew 1 tablet (200 mg of elemental calcium  total) by mouth 3 (three) times daily with meals.   carvedilol  25 MG  tablet Commonly known as: COREG  Take 1 tablet (25 mg total) by mouth 2 (two) times daily with a meal.   cloNIDine  0.1 MG tablet Commonly known as: CATAPRES  Take 1 tablet (0.1 mg total) by mouth 3 (three) times daily.   doxazosin  8 MG tablet Commonly known as: CARDURA  Take 1 tablet (8 mg total) by mouth daily.   doxazosin  4 MG tablet Commonly known as: CARDURA  Take 3 tablets (12 mg total) by mouth at bedtime.   hydrALAZINE  100 MG tablet Commonly known as: APRESOLINE  Take 1 tablet (100 mg total) by mouth 3 (three) times daily.   insulin  lispro 100 UNIT/ML injection Commonly known as: HUMALOG  Inject 0-0.04 mLs (0-4 Units total) into the skin 3 (three) times daily as needed for high blood sugar.   Insulin  Pen Needle 31G X 6 MM Misc Use as directed in the morning, at noon, in the evening, and at bedtime.   liraglutide  18 MG/3ML Sopn Commonly known as: Victoza  Inject 1.8 mg into the skin daily.   methylPREDNISolone  4 MG Tbpk tablet Commonly known as: MEDROL  DOSEPAK follow package directions   nitroGLYCERIN  0.4 MG SL tablet Commonly known as: NITROSTAT  Place 1 tablet (0.4 mg total) under the tongue every 5 (five) minutes as needed for chest pain.   sertraline  25 MG tablet Commonly known as: ZOLOFT  Take 1 tablet (25 mg total) by mouth daily.   Spiriva HandiHaler 18 MCG inhalation capsule Generic drug: tiotropium Place 1 capsule (18 mcg total) into inhaler and inhale daily.   Symbicort  160-4.5 MCG/ACT inhaler Generic drug: budesonide -formoterol  Inhale 2 puffs into the lungs 2 (two) times daily.         Follow-up Information     Lorren Greig PARAS, NP. Schedule an appointment as soon as possible for a visit in 1 week(s).   Specialty: Nurse Practitioner Contact information: [phone]  [employer] [facility] [address] [place] [address] [phone]                 Major procedures and Radiology Reports - PLEASE review detailed and final reports thoroughly  -         DG Chest Port 1 View Result Date: 02/09/2024 EXAM: UPRIGHT AP PORTABLE VIEW XRAY OF THE CHEST 02/09/2024 05:47 AM COMPARISON: Portable chest x-ray yesterday and earlier. CLINICAL HISTORY: 53 year old male with shortness of breath, history of COPD, ESRD, and hypertension. FINDINGS: LUNGS AND PLEURA: Lower lung volumes. Symmetric diffuse crowding of lung markings suggesting mild generalized atelectasis. No focal pulmonary opacity. No pulmonary edema. No pleural effusion. No pneumothorax. HEART AND MEDIASTINUM: Stable cardiomegaly and mediastinal contours. BONES AND SOFT TISSUES: No acute osseous abnormality. IMPRESSION: 1. Stable cardiomegaly. 2. Lower lung volumes with atelectasis. Electronically signed by: Helayne Hurst MD 02/09/2024 06:12 AM EDT RP Workstation: [id number]   DG Chest Port 1 View Result Date: 02/08/2024 EXAM: 1 VIEW XRAY OF THE CHEST 02/08/2024 03:11:00 AM COMPARISON: 11/09/2023 CLINICAL HISTORY: Shortness of breath. c/o SOB all day. Reports no relief with at home breathing treatment. Denies hx COPD, reports possible asthma per lung MD. FINDINGS: LUNGS AND PLEURA: No focal pulmonary opacity. No pulmonary edema. No pleural effusion. No pneumothorax. HEART AND MEDIASTINUM: Cardiomegaly, unchanged. BONES AND SOFT TISSUES: No acute osseous abnormality. IMPRESSION: 1. No acute findings. 2. Cardiomegaly, unchanged. Electronically signed by: Dorethia Molt MD 02/08/2024 03:23 AM EDT RP Workstation: [id number]    Micro Results    Recent Results (from the past 240 hours)  Resp panel by RT-PCR (RSV, Flu A&B, Covid) Anterior Nasal Swab     Status: None   Collection Time: 02/08/24  2:50 AM   Specimen: Anterior Nasal Swab  Result Value Ref Range Status   SARS Coronavirus 2 by RT PCR NEGATIVE NEGATIVE Final    Comment: (NOTE) SARS-CoV-2 target nucleic acids are NOT DETECTED.  The SARS-CoV-2 RNA is generally detectable in upper respiratory specimens during the acute phase of infection. The  lowest concentration of SARS-CoV-2 viral copies this assay can detect is 138 copies/mL. A negative result does not preclude SARS-Cov-2 infection and should not be used as the sole basis for treatment or other patient management decisions. A negative result may occur with  improper specimen collection/handling, submission of specimen other than nasopharyngeal swab, presence of viral mutation(s) within the areas targeted by this assay, and inadequate number of viral copies(<138 copies/mL). A negative result must be combined with clinical observations, patient history, and epidemiological information. The expected result is Negative.  Fact Sheet for Patients:  BloggerCourse.com  Fact Sheet for Healthcare Providers:  SeriousBroker.it  This test is no t yet approved or cleared by the [place]  FDA and  has been authorized for detection and/or diagnosis of SARS-CoV-2 by FDA under an Emergency Use Authorization (EUA). This EUA will remain  in effect (meaning this test can be used) for the duration of the COVID-19 declaration under Section 564(b)(1) of the Act, 21 U.S.C.section 360bbb-3(b)(1), unless the authorization is terminated  or revoked sooner.       Influenza A by PCR NEGATIVE NEGATIVE Final   Influenza B by PCR NEGATIVE NEGATIVE Final    Comment: (NOTE) The Xpert Xpress SARS-CoV-2/FLU/RSV plus assay is intended as an aid in the diagnosis of influenza from Nasopharyngeal swab specimens and should not be used as a sole basis for treatment. Nasal washings and aspirates are unacceptable for Xpert Xpress SARS-CoV-2/FLU/RSV testing.  Fact  Sheet for Patients: BloggerCourse.com  Fact Sheet for Healthcare Providers: SeriousBroker.it  This test is not yet approved or cleared by the [place]  FDA and has been authorized for detection and/or diagnosis of SARS-CoV-2 by FDA under  an Emergency Use Authorization (EUA). This EUA will remain in effect (meaning this test can be used) for the duration of the COVID-19 declaration under Section 564(b)(1) of the Act, 21 U.S.C. section 360bbb-3(b)(1), unless the authorization is terminated or revoked.     Resp Syncytial Virus by PCR NEGATIVE NEGATIVE Final    Comment: (NOTE) Fact Sheet for Patients: BloggerCourse.com  Fact Sheet for Healthcare Providers: SeriousBroker.it  This test is not yet approved or cleared by the [place]  FDA and has been authorized for detection and/or diagnosis of SARS-CoV-2 by FDA under an Emergency Use Authorization (EUA). This EUA will remain in effect (meaning this test can be used) for the duration of the COVID-19 declaration under Section 564(b)(1) of the Act, 21 U.S.C. section 360bbb-3(b)(1), unless the authorization is terminated or revoked.  Performed at [facility], [address] [place], [place] [address]     Today   Subjective    Matthew Odom today has no headache,no chest abdominal pain,no new weakness tingling or numbness, feels much better wants to go home today.    Objective   Blood pressure (!) 163/98, pulse 81, temperature 97.8 F (36.6 C), temperature source Oral, resp. rate 19, height 5' 11 (1.803 m), weight 117.8 kg, SpO2 96%.   Intake/Output Summary (Last 24 hours) at 02/09/2024 0827 Last data filed at 02/08/2024 1800 Gross per 24 hour  Intake 480 ml  Output 650 ml  Net -170 ml    Exam  Awake Alert, No new F.N deficits,    [place].AT,PERRAL Supple Neck,   Symmetrical Chest wall movement, Good air movement bilaterally, CTAB RRR,No Gallops,   +ve B.Sounds, Abd Soft, Non tender,  No Cyanosis, Clubbing or edema    Data Review   Recent Labs  Lab 02/08/24 0249 02/08/24 0305  WBC 9.6  --   HGB 10.1* 10.5*  HCT 30.9* 31.0*  PLT 245  --   MCV 86.1  --   MCH 28.1  --   MCHC  32.7  --   RDW 14.5  --     Recent Labs  Lab 02/08/24 0249 02/08/24 0305 02/08/24 1558  NA 138 137  --   K 4.6 4.3  --   CL 97*  --   --   CO2 23  --   --   ANIONGAP 18*  --   --   GLUCOSE 122*  --   --   BUN 88*  --   --   CREATININE 14.30*  --  15.01*  AST 10*  --   --   ALT 10  --   --   ALKPHOS 63  --   --   BILITOT 0.4  --   --   ALBUMIN 4.1  --   --   CALCIUM  9.4  --   --     Total Time in preparing paper work, data evaluation and todays exam - 35 minutes  Signature  -    Lavada Stank M.D on 02/09/2024 at 8:27 AM   -  To page go to [email or web address]

## 2024-02-09 NOTE — Progress Notes (Signed)
 Assumed care 1900. Pt had dc paperwork already from day RN and just waiting on ride. IV removed. Pt ride arrived and patient walked self off unit. Declined wheelchair.

## 2024-02-09 NOTE — Progress Notes (Signed)
 Matthew Odom presented to the ED yesterday c/o shortness of breath. Patient is here under observation. Plan to dialyze him here then discharge to home afterwards if he remains stable. Will perform a formal consult if patient is officially admitted.  [name], NP [facility]

## 2024-02-09 NOTE — Plan of Care (Signed)

## 2024-02-09 NOTE — Evaluation (Signed)
 Physical Therapy Brief Evaluation and Discharge Note Patient Details Name: Matthew Odom MRN: [id number] DOB: February 01, 1971 Today's Date: 02/09/2024   History of Present Illness  Matthew Odom is a 53 y.o. male who presented to the emergency department at [facility] with complaint of shortness of breath. On presentation, he was severely hypertensive with systolic blood pressure in the range of 200s. Past medical history significant of ESRD on dialysis on TTS schedule, coronary artery  disease, Asthma, hypertension, diabetes type 2, HFpEF.  Clinical Impression  Pt presents with admitting diagnosis above. Pt today very lethargic however agreeable to room ambulation. Pt was able to perform independently. PTA pt was fully independent. Pt presents at or near baseline mobility. Pt has no further acute PT needs and will be signing off. Re consult PT if mobility status changes. Pt anticipates DC home today.       PT Assessment Patient does not need any further PT services  Assistance Needed at Discharge  PRN    Equipment Recommendations None recommended by PT  Recommendations for Other Services       Precautions/Restrictions Precautions Precautions: Fall Recall of Precautions/Restrictions: Intact Restrictions Weight Bearing Restrictions Per Provider Order: No        Mobility  Bed Mobility   Supine/Sidelying to sit: Modified independent (Device/Increased time), HOB elevated Sit to supine/sidelying: Modified independent (Device/Increased time), HOB elevated    Transfers Overall transfer level: Independent Equipment used: None                    Ambulation/Gait Ambulation/Gait assistance: Independent Gait Distance (Feet): 25 Feet Assistive device: None Gait Pattern/deviations: WFL(Within Functional Limits) Gait Speed: Pace WFL General Gait Details: Pt declined hallway ambulation. Ind for room ambulation.  no LOB noted.  Home Activity Instructions    Stairs             Modified Rankin (Stroke Patients Only)        Balance Overall balance assessment: Independent                        Pertinent Vitals/Pain PT - Brief Vital Signs All Vital Signs Stable: Yes Pain Assessment Pain Assessment: No/denies pain     Home Living Family/patient expects to be discharged to:: Private residence Living Arrangements: Spouse/significant other;Children Available Help at Discharge: Family;Available 24 hours/day Home Environment: Stairs to enter;No rail  Stairs-Number of Steps: 2 Home Equipment: Other (comment) (Knee scooter)        Prior Function Level of Independence: Independent      UE/LE Assessment   UE ROM/Strength/Tone/Coordination: WFL    LE ROM/Strength/Tone/Coordination: [facility]      Communication   Communication Communication: No apparent difficulties     Cognition Overall Cognitive Status: Appears within functional limits for tasks assessed/performed       General Comments General comments (skin integrity, edema, etc.): VSS    Exercises     Assessment/Plan    PT Problem List         PT Visit Diagnosis Other abnormalities of gait and mobility (R26.89)    No Skilled PT Patient at baseline level of functioning;Patient is independent with all acitivity/mobility   Co-evaluation                AMPAC 6 Clicks Help needed turning from your back to your side while in a flat bed without using bedrails?: None Help needed moving from lying on your back to sitting on the side of a  flat bed without using bedrails?: None Help needed moving to and from a bed to a chair (including a wheelchair)?: None Help needed standing up from a chair using your arms (e.g., wheelchair or bedside chair)?: None Help needed to walk in hospital room?: None Help needed climbing 3-5 steps with a railing? : None 6 Click Score: 24      End of Session Equipment Utilized During Treatment: Gait belt Activity Tolerance: Patient tolerated  treatment well Patient left: in bed;with call bell/phone within reach Nurse Communication: Mobility status PT Visit Diagnosis: Other abnormalities of gait and mobility (R26.89)     Time: 1052-1100 PT Time Calculation (min) (ACUTE ONLY): 8 min  Charges:   PT Evaluation $PT Eval Low Complexity: 1 Low      Rue Tinnel B, PT, DPT Acute Rehab Services [phone]   Sueellen Buddle  02/09/2024, 11:47 AM

## 2024-02-09 NOTE — Progress Notes (Signed)
 Surgical Instructions                 Your procedure is scheduled on Wednesday October 8.             Report to [facility] Main Entrance A at 12:30 P.M., then check in with the Admitting office.             Call this number if you have problems the morning of surgery:             [phone]                 Remember:             Do not eat or drink anything after midnight the night before your surgery               Take these medicines the morning of surgery with A SIP OF WATER :  amLODipine  (NORVASC )  aspirin  EC  budesonide -formoterol  (SYMBICORT )  carvedilol  (COREG )  doxazosin  (CARDURA ) 8 MG  hydrALAZINE  (APRESOLINE )  sertraline  (ZOLOFT )  tiotropium (SPIRIVA)      As of today, STOP taking any Aleve, Naproxen, Ibuprofen, Motrin, Advil, Goody's, BC's, all herbal medications, fish oil, and all vitamins.     WHAT DO I DO ABOUT MY DIABETES MEDICATION?                              THE MORNING OF SURGERY, take 10  units of Glargine insulin .    The day of surgery, do not take other diabetes injectables, including Victoza  (liraglutide ). liraglutide  (VICTOZA ) should be held 24 hours prior to surgery.    If your CBG is greater than 220 mg/dL the morning of surgery, you may take  of your sliding scale (correction) dose of lispro (HUMALOG ) . If your blood sugar is less than 220, do not take any lispro (HUMALOG ).       Check your blood sugar the morning of your surgery when you wake up and every 2 hours until you get to the Short Stay unit.   If your blood sugar is less than 70 mg/dL, you will need to treat for low blood sugar: Do not take insulin . Treat a low blood sugar (less than 70 mg/dL) with  cup of clear juice (cranberry or apple), 4 glucose tablets, OR glucose gel. Recheck blood sugar in 15 minutes after treatment (to make sure it is greater than 70 mg/dL). If your blood sugar is not greater than 70 mg/dL on recheck, call [phone] for further instructions. Report your  blood sugar to the short stay nurse when you get to Short Stay.   [facility] is not responsible for any belongings or valuables. .    Do NOT Smoke (Tobacco/Vaping)  24 hours prior to your procedure   If you use a CPAP at night, you may bring your mask for your overnight stay.   Contacts, glasses, hearing aids, dentures or partials may not be worn into surgery, please bring cases for these belongings   Patients discharged the day of surgery will not be allowed to drive home, and someone needs to stay with them for 24 hours.     SURGICAL WAITING ROOM VISITATION You may have 1 visitor in the pre-op area at a time determined by the pre-op nurse. (Visitor may not switch out) Patients having surgery or a procedure in a hospital may have two support people in the  waiting room. Children under the age of 57 must have an adult with them who is not the patient. They may stay in the waiting area during the procedure and may switch out with other visitors. If the patient needs to stay at the hospital during part of their recovery, the visitor guidelines for inpatient rooms apply.   Please refer to the [facility] website for the visitor guidelines for Inpatients (after your surgery is over and you are in a regular room).        Special instructions:     Oral Hygiene is also important to reduce your risk of infection.  Remember - BRUSH YOUR TEETH THE MORNING OF SURGERY WITH YOUR REGULAR TOOTHPASTE     Day of Surgery:   Take a shower the day of or night before with antibacterial soap. Wear Clean/Comfortable clothing the morning of surgery Do not apply any deodorants/lotions.   Do not wear jewelry or makeup Do not wear lotions, powders, perfumes/colognes, or deodorant. Do not shave 48 hours prior to surgery.  Men may shave face and neck. Do not bring valuables to the hospital. Do not wear nail polish, gel polish, artificial nails, or any other type of covering on natural nails (fingers and  toes) If you have artificial nails or gel coating that need to be removed by a nail salon, please have this removed prior to surgery. Artificial nails or gel coating may interfere with anesthesia's ability to adequately monitor your vital signs. Remember to brush your teeth WITH YOUR REGULAR TOOTHPASTE.

## 2024-02-09 NOTE — Progress Notes (Signed)
 D/C order and HD order for today noted. Contacted nephrologist and renal NP to discuss pt. Providers felt pt was appropriate for out-pt HD at clinic today if clinic has availability for today. Contacted [facility] and spoke to staff. Pt missed 7:10 am chair time this morning and clinic does not have any available 2nd shift appts for today. Update provided to nephrologist and renal NP. Pt will require inpt HD prior to d/c for this reason. Will advise clinic of pt's d/c today and that pt should resume care on Thursday.   Randine Mungo Dialysis Navigator [phone]

## 2024-02-10 ENCOUNTER — Ambulatory Visit (HOSPITAL_COMMUNITY): Payer: Self-pay | Admitting: Physician Assistant

## 2024-02-10 ENCOUNTER — Ambulatory Visit (HOSPITAL_COMMUNITY)

## 2024-02-10 ENCOUNTER — Other Ambulatory Visit: Payer: Self-pay

## 2024-02-10 ENCOUNTER — Other Ambulatory Visit (HOSPITAL_COMMUNITY): Payer: Self-pay

## 2024-02-10 ENCOUNTER — Ambulatory Visit (HOSPITAL_BASED_OUTPATIENT_CLINIC_OR_DEPARTMENT_OTHER): Payer: Self-pay | Admitting: Physician Assistant

## 2024-02-10 ENCOUNTER — Encounter (HOSPITAL_COMMUNITY): Payer: Self-pay | Admitting: Vascular Surgery

## 2024-02-10 ENCOUNTER — Ambulatory Visit (HOSPITAL_COMMUNITY)
Admission: RE | Admit: 2024-02-10 | Discharge: 2024-02-10 | Disposition: A | Discharge status: Home / Self Care | Attending: Vascular Surgery | Admitting: Vascular Surgery

## 2024-02-10 ENCOUNTER — Ambulatory Visit: Payer: Self-pay | Admitting: Family Medicine

## 2024-02-10 ENCOUNTER — Encounter (HOSPITAL_COMMUNITY)
Admission: RE | Disposition: A | Discharge status: Home / Self Care | Payer: Self-pay | Source: Home / Self Care | Attending: Vascular Surgery

## 2024-02-10 DIAGNOSIS — G4730 Sleep apnea, unspecified: Secondary | ICD-10-CM | POA: Insufficient documentation

## 2024-02-10 DIAGNOSIS — T82848A Pain from vascular prosthetic devices, implants and grafts, initial encounter: Secondary | ICD-10-CM

## 2024-02-10 DIAGNOSIS — T82590A Other mechanical complication of surgically created arteriovenous fistula, initial encounter: Secondary | ICD-10-CM | POA: Diagnosis not present

## 2024-02-10 DIAGNOSIS — J45909 Unspecified asthma, uncomplicated: Secondary | ICD-10-CM | POA: Insufficient documentation

## 2024-02-10 DIAGNOSIS — I5042 Chronic combined systolic (congestive) and diastolic (congestive) heart failure: Secondary | ICD-10-CM | POA: Diagnosis not present

## 2024-02-10 DIAGNOSIS — Y828 Other medical devices associated with adverse incidents: Secondary | ICD-10-CM | POA: Diagnosis not present

## 2024-02-10 DIAGNOSIS — Z992 Dependence on renal dialysis: Secondary | ICD-10-CM | POA: Insufficient documentation

## 2024-02-10 DIAGNOSIS — I509 Heart failure, unspecified: Secondary | ICD-10-CM | POA: Insufficient documentation

## 2024-02-10 DIAGNOSIS — I132 Hypertensive heart and chronic kidney disease with heart failure and with stage 5 chronic kidney disease, or end stage renal disease: Secondary | ICD-10-CM | POA: Insufficient documentation

## 2024-02-10 DIAGNOSIS — I252 Old myocardial infarction: Secondary | ICD-10-CM | POA: Diagnosis not present

## 2024-02-10 DIAGNOSIS — E1122 Type 2 diabetes mellitus with diabetic chronic kidney disease: Secondary | ICD-10-CM | POA: Diagnosis not present

## 2024-02-10 DIAGNOSIS — I120 Hypertensive chronic kidney disease with stage 5 chronic kidney disease or end stage renal disease: Secondary | ICD-10-CM

## 2024-02-10 DIAGNOSIS — N186 End stage renal disease: Secondary | ICD-10-CM | POA: Insufficient documentation

## 2024-02-10 DIAGNOSIS — F17200 Nicotine dependence, unspecified, uncomplicated: Secondary | ICD-10-CM | POA: Diagnosis not present

## 2024-02-10 DIAGNOSIS — I2510 Atherosclerotic heart disease of native coronary artery without angina pectoris: Secondary | ICD-10-CM

## 2024-02-10 HISTORY — PX: INSERTION OF ARTERIOVENOUS (AV) ARTEGRAFT ARM: SHX6779

## 2024-02-10 LAB — POCT I-STAT, CHEM 8
BUN: 97 mg/dL — ABNORMAL HIGH (ref 6–20)
Calcium, Ion: 1.05 mmol/L — ABNORMAL LOW (ref 1.15–1.40)
Chloride: 98 mmol/L (ref 98–111)
Creatinine, Ser: 12.7 mg/dL — ABNORMAL HIGH (ref 0.61–1.24)
Glucose, Bld: 89 mg/dL (ref 70–99)
HCT: 30 % — ABNORMAL LOW (ref 39.0–52.0)
Hemoglobin: 10.2 g/dL — ABNORMAL LOW (ref 13.0–17.0)
Potassium: 4.3 mmol/L (ref 3.5–5.1)
Sodium: 134 mmol/L — ABNORMAL LOW (ref 135–145)
TCO2: 25 mmol/L (ref 22–32)

## 2024-02-10 LAB — HEPATITIS B SURFACE ANTIBODY, QUANTITATIVE: Hep B S AB Quant (Post): 46.4 m[IU]/mL

## 2024-02-10 LAB — GLUCOSE, CAPILLARY
Glucose-Capillary: 101 mg/dL — ABNORMAL HIGH (ref 70–99)
Glucose-Capillary: 105 mg/dL — ABNORMAL HIGH (ref 70–99)

## 2024-02-10 SURGERY — INSERTION, GRAFT, ARTERIOVENOUS, UPPER EXTREMITY
Anesthesia: Monitor Anesthesia Care | Site: Arm Lower | Laterality: Left

## 2024-02-10 MED ORDER — SODIUM CHLORIDE 0.9 % IV SOLN: INTRAVENOUS | Status: DC

## 2024-02-10 MED ORDER — PROPOFOL 500 MG/50ML IV EMUL
INTRAVENOUS | Status: DC | PRN
  Administered 2024-02-10: 100 ug/kg/min via INTRAVENOUS

## 2024-02-10 MED ORDER — HEPARIN 6000 UNIT IRRIGATION SOLUTION
Status: AC
  Filled 2024-02-10: qty 500

## 2024-02-10 MED ORDER — ORAL CARE MOUTH RINSE: 15.0000 mL | Freq: Once | OROMUCOSAL | Status: AC

## 2024-02-10 MED ORDER — FENTANYL CITRATE (PF) 250 MCG/5ML IJ SOLN
INTRAMUSCULAR | Status: DC | PRN
Start: 2024-02-10 — End: 2024-02-10
  Administered 2024-02-10: 50 ug via INTRAVENOUS

## 2024-02-10 MED ORDER — OXYCODONE HCL 5 MG PO TABS
5.0000 mg | ORAL_TABLET | Freq: Four times a day (QID) | ORAL | 0 refills | Status: DC | PRN
  Filled 2024-02-10: qty 6, 2d supply, fill #0

## 2024-02-10 MED ORDER — MIDAZOLAM HCL 2 MG/2ML IJ SOLN: 1.0000 mg | Freq: Once | INTRAMUSCULAR | Status: AC

## 2024-02-10 MED ORDER — LIDOCAINE HCL (PF) 1 % IJ SOLN
INTRAMUSCULAR | Status: AC
  Filled 2024-02-10: qty 30

## 2024-02-10 MED ORDER — OXYCODONE HCL 5 MG/5ML PO SOLN: 5.0000 mg | Freq: Once | ORAL | Status: AC | PRN

## 2024-02-10 MED ORDER — MIDAZOLAM HCL 2 MG/2ML IJ SOLN
INTRAMUSCULAR | Status: AC
  Administered 2024-02-10: 1 mg via INTRAVENOUS
  Filled 2024-02-10: qty 2

## 2024-02-10 MED ORDER — INSULIN ASPART 100 UNIT/ML IJ SOLN: 0.0000 [IU] | INTRAMUSCULAR | Status: DC | PRN

## 2024-02-10 MED ORDER — CHLORHEXIDINE GLUCONATE 4 % EX SOLN: 60.0000 mL | Freq: Once | CUTANEOUS | Status: DC

## 2024-02-10 MED ORDER — FENTANYL CITRATE (PF) 100 MCG/2ML IJ SOLN: 25.0000 ug | INTRAMUSCULAR | Status: DC | PRN

## 2024-02-10 MED ORDER — LIDOCAINE-EPINEPHRINE (PF) 1 %-1:200000 IJ SOLN
INTRAMUSCULAR | Status: DC | PRN
Start: 2024-02-10 — End: 2024-02-10
  Administered 2024-02-10: 18 mL

## 2024-02-10 MED ORDER — HEPARIN SODIUM (PORCINE) 1000 UNIT/ML IJ SOLN
INTRAMUSCULAR | Status: AC
  Filled 2024-02-10: qty 10

## 2024-02-10 MED ORDER — ACETAMINOPHEN 10 MG/ML IV SOLN: 1000.0000 mg | Freq: Once | INTRAVENOUS | Status: DC | PRN

## 2024-02-10 MED ORDER — ONDANSETRON HCL 4 MG/2ML IJ SOLN: 4.0000 mg | Freq: Once | INTRAMUSCULAR | Status: DC | PRN

## 2024-02-10 MED ORDER — PHENYLEPHRINE HCL-NACL 20-0.9 MG/250ML-% IV SOLN
INTRAVENOUS | Status: DC | PRN
  Administered 2024-02-10: 20 ug/min via INTRAVENOUS

## 2024-02-10 MED ORDER — LIDOCAINE-EPINEPHRINE (PF) 1 %-1:200000 IJ SOLN
INTRAMUSCULAR | Status: AC
  Filled 2024-02-10: qty 30

## 2024-02-10 MED ORDER — DROPERIDOL 2.5 MG/ML IJ SOLN: 0.6250 mg | Freq: Once | INTRAMUSCULAR | Status: DC | PRN

## 2024-02-10 MED ORDER — HEPARIN 6000 UNIT IRRIGATION SOLUTION
Status: DC | PRN
  Administered 2024-02-10: 1

## 2024-02-10 MED ORDER — FENTANYL CITRATE (PF) 250 MCG/5ML IJ SOLN
INTRAMUSCULAR | Status: AC
  Filled 2024-02-10: qty 5

## 2024-02-10 MED ORDER — PROPOFOL 10 MG/ML IV BOLUS
INTRAVENOUS | Status: DC | PRN
  Administered 2024-02-10: 30 mg via INTRAVENOUS
  Administered 2024-02-10: 20 mg via INTRAVENOUS
  Administered 2024-02-10: 50 mg via INTRAVENOUS

## 2024-02-10 MED ORDER — FENTANYL CITRATE (PF) 100 MCG/2ML IJ SOLN: 50.0000 ug | Freq: Once | INTRAMUSCULAR | Status: AC

## 2024-02-10 MED ORDER — OXYCODONE HCL 5 MG PO TABS
5.0000 mg | ORAL_TABLET | Freq: Once | ORAL | Status: AC | PRN
  Administered 2024-02-10: 5 mg via ORAL

## 2024-02-10 MED ORDER — FENTANYL CITRATE (PF) 100 MCG/2ML IJ SOLN
INTRAMUSCULAR | Status: AC
  Administered 2024-02-10: 50 ug via INTRAVENOUS
  Filled 2024-02-10: qty 2

## 2024-02-10 MED ORDER — OXYCODONE HCL 5 MG PO TABS
ORAL_TABLET | ORAL | Status: AC
  Filled 2024-02-10: qty 1

## 2024-02-10 MED ORDER — 0.9 % SODIUM CHLORIDE (POUR BTL) OPTIME
TOPICAL | Status: DC | PRN
  Administered 2024-02-10: 1000 mL

## 2024-02-10 MED ORDER — CHLORHEXIDINE GLUCONATE 0.12 % MT SOLN
15.0000 mL | Freq: Once | OROMUCOSAL | Status: AC
  Administered 2024-02-10: 15 mL via OROMUCOSAL
  Filled 2024-02-10: qty 15

## 2024-02-10 MED ORDER — SODIUM CHLORIDE 0.9 % IV SOLN
INTRAVENOUS | Status: DC
Start: 2024-02-10 — End: 2024-02-10

## 2024-02-10 MED ORDER — HEPARIN SODIUM (PORCINE) 1000 UNIT/ML IJ SOLN
INTRAMUSCULAR | Status: DC | PRN
  Administered 2024-02-10: 5000 [IU] via INTRAVENOUS

## 2024-02-10 MED ORDER — CEFAZOLIN SODIUM-DEXTROSE 2-4 GM/100ML-% IV SOLN
2.0000 g | INTRAVENOUS | Status: AC
  Administered 2024-02-10: 2 g via INTRAVENOUS
  Filled 2024-02-10: qty 100

## 2024-02-10 SURGICAL SUPPLY — 36 items
ARMBAND PINK RESTRICT EXTREMIT (MISCELLANEOUS) ×3 IMPLANT
BAG DECANTER FOR FLEXI CONT (MISCELLANEOUS) ×3 IMPLANT
BENZOIN TINCTURE PRP APPL 2/3 (GAUZE/BANDAGES/DRESSINGS) ×3 IMPLANT
BIOPATCH RED 1 DISK 7.0 (GAUZE/BANDAGES/DRESSINGS) ×2 IMPLANT
CANISTER SUCTION 3000ML PPV (SUCTIONS) ×3 IMPLANT
CANNULA VESSEL 3MM 2 BLNT TIP (CANNULA) ×3 IMPLANT
CHLORAPREP W/TINT 26 (MISCELLANEOUS) ×3 IMPLANT
CLIP LIGATING EXTRA MED SLVR (CLIP) ×3 IMPLANT
CLIP LIGATING EXTRA SM BLUE (MISCELLANEOUS) ×3 IMPLANT
CLSR STERI-STRIP ANTIMIC 1/2X4 (GAUZE/BANDAGES/DRESSINGS) ×1 IMPLANT
COVER PROBE W GEL 5X96 (DRAPES) ×3 IMPLANT
COVER SURGICAL LIGHT HANDLE (MISCELLANEOUS) ×3 IMPLANT
DRSG TEGADERM 4X4.75 (GAUZE/BANDAGES/DRESSINGS) ×2 IMPLANT
ELECTRODE REM PT RTRN 9FT ADLT (ELECTROSURGICAL) ×3 IMPLANT
GLOVE BIO SURGEON STRL SZ8 (GLOVE) ×3 IMPLANT
GOWN STRL REUS W/ TWL LRG LVL3 (GOWN DISPOSABLE) ×6 IMPLANT
GOWN STRL REUS W/ TWL XL LVL3 (GOWN DISPOSABLE) ×3 IMPLANT
GRAFT VASC ACUSEAL 4-7X45 (Vascular Products) ×1 IMPLANT
KIT BASIN OR (CUSTOM PROCEDURE TRAY) ×3 IMPLANT
KIT TURNOVER KIT B (KITS) ×3 IMPLANT
LOOP VESSEL MINI RED (MISCELLANEOUS) ×1 IMPLANT
PACK CV ACCESS (CUSTOM PROCEDURE TRAY) ×3 IMPLANT
PAD ARMBOARD POSITIONER FOAM (MISCELLANEOUS) ×6 IMPLANT
SOLN 0.9% NACL 1000 ML (IV SOLUTION) ×2 IMPLANT
SOLN 0.9% NACL POUR BTL 1000ML (IV SOLUTION) ×3 IMPLANT
SOLN STERILE WATER 1000 ML (IV SOLUTION) ×2 IMPLANT
SOLN STERILE WATER BTL 1000 ML (IV SOLUTION) ×3 IMPLANT
STRIP CLOSURE SKIN 1/2X4 (GAUZE/BANDAGES/DRESSINGS) ×3 IMPLANT
SUT ETHILON 3 0 PS 1 (SUTURE) ×2 IMPLANT
SUT MNCRL AB 4-0 PS2 18 (SUTURE) ×3 IMPLANT
SUT PROLENE 5 0 C 1 24 (SUTURE) ×2 IMPLANT
SUT PROLENE 6 0 BV (SUTURE) ×3 IMPLANT
SUT VIC AB 3-0 SH 27X BRD (SUTURE) ×4 IMPLANT
TOWEL GREEN STERILE (TOWEL DISPOSABLE) ×3 IMPLANT
TOWEL GREEN STERILE FF (TOWEL DISPOSABLE) ×5 IMPLANT
UNDERPAD 30X36 HEAVY ABSORB (UNDERPADS AND DIAPERS) ×3 IMPLANT

## 2024-02-10 NOTE — Progress Notes (Signed)
 Patient called this morning asking the staff what he can do if his CBG is 87. This writer instructed the patient to not eat or drink anything and to continue to check CBG every 2 hrs until he will come to the hospital for surgery (12:30 o'clock). Patient verbalized understanding.

## 2024-02-10 NOTE — Anesthesia Postprocedure Evaluation (Signed)
 Anesthesia Post Note  Patient: JAIMEN MELONE  Procedure(s) Performed: INSERTION, GRAFT, ARTERIOVENOUS, UPPER EXTREMITY USING GORE 4-7MM X 45CM ACUSEAL VASCULAR GRAFT (Left: Arm Lower)     Patient location during evaluation: PACU Anesthesia Type: Regional and MAC Level of consciousness: awake and alert Pain management: pain level controlled Vital Signs Assessment: post-procedure vital signs reviewed and stable Respiratory status: spontaneous breathing Cardiovascular status: blood pressure returned to baseline Postop Assessment: no apparent nausea or vomiting Anesthetic complications: no   No notable events documented.  Last Vitals:  Vitals:   02/10/24 1545 02/10/24 1550  BP: (!) 149/88   Pulse: (!) 57 (!) 57  Resp: 20 12  Temp:  36.4 C  SpO2: 100% 100%    Last Pain:  Vitals:   02/10/24 1545  TempSrc:   PainSc: 4                  Lauraine KATHEE [place]

## 2024-02-10 NOTE — Anesthesia Procedure Notes (Addendum)
 Anesthesia Regional Block: Supraclavicular block   Pre-Anesthetic Checklist: , timeout performed,  Correct Patient, Correct Site, Correct Laterality,  Correct Procedure, Correct Position, site marked,  Risks and benefits discussed,  Surgical consent,  Pre-op evaluation,  At surgeon's request and post-op pain management  Laterality: Left and Upper  Prep: chloraprep       Needles:  Injection technique: Single-shot  Needle Type: Echogenic Stimulator Needle     Needle Length: 10cm  Needle Gauge: 20     Additional Needles:   Procedures:,,,, ultrasound used (permanent image in chart),, #20gu IV placed    Narrative:  Start time: 02/10/2024 1:31 PM End time: 02/10/2024 1:31 PM Injection made incrementally with aspirations every 5 mL.  Performed by: Personally  Anesthesiologist: Waddell Lauraine NOVAK, MD  Additional Notes: Timeout performed with bedside RN - Name, DOB, allergies and laterality confirmed by the patient and RN. Surgical marking performed/confirmed. Anticoagulation status and most recent platelet count reviewed. Sedation (as documented by RN) given via PIV. Peripheral nerve block performed as documented above. VSS throughout (see Flowchart).

## 2024-02-10 NOTE — Discharge Instructions (Signed)
   [facility]  Discharge Instructions  AV Fistula or Graft Surgery for Dialysis Access  Please refer to the following instructions for your post-procedure care. Your surgeon or physician assistant will discuss any changes with you.  Activity  You may drive the day following your surgery, if you are comfortable and no longer taking prescription pain medication. Resume full activity as the soreness in your incision resolves.  Bathing/Showering  You may shower after you go home. Keep your incision dry for 48 hours. Do not soak in a bathtub, hot tub, or swim until the incision heals completely. You may not shower if you have a hemodialysis catheter.  Incision Care  Clean your incision with mild soap and water  after 48 hours. Pat the area dry with a clean towel. You do not need a bandage unless otherwise instructed. Do not apply any ointments or creams to your incision. You may have skin glue on your incision. Do not peel it off. It will come off on its own in about one week. Your arm may swell a bit after surgery. To reduce swelling use pillows to elevate your arm so it is above your heart. Your doctor will tell you if you need to lightly wrap your arm with an ACE bandage.  Diet  Resume your normal diet. There are not special food restrictions following this procedure. In order to heal from your surgery, it is CRITICAL to get adequate nutrition. Your body requires vitamins, minerals, and protein. Vegetables are the best source of vitamins and minerals. Vegetables also provide the perfect balance of protein. Processed food has little nutritional value, so try to avoid this.  Medications  Resume taking all of your medications. If your incision is causing pain, you may take over-the counter pain relievers such as acetaminophen  (Tylenol ). If you were prescribed a stronger pain medication, please be aware these medications can cause nausea and constipation. Prevent  nausea by taking the medication with a snack or meal. Avoid constipation by drinking plenty of fluids and eating foods with high amount of fiber, such as fruits, vegetables, and grains.  Do not take Tylenol  if you are taking prescription pain medications.  Follow up Follow up with your surgeon as needed if there are any issues with your access  Please call us  immediately for any of the following conditions:  Increased pain, redness, drainage (pus) from your incision site Fever of 101 degrees or higher Severe or worsening pain at your incision site Hand pain or numbness.  Reduce your risk of vascular disease:  Stop smoking. If you would like help, call [facility] at 1-800-QUIT-NOW ((443)299-0128) or [place] at 2024038116  Manage your cholesterol Maintain a desired weight Control your diabetes Keep your blood pressure down  Dialysis  Your graft can be used as early as 02/11/2024  02/10/2024 Matthew Odom [id number] [birth year]  Surgeon(s): Magda Debby SAILOR, MD  Procedure(s): INSERTION, GRAFT, ARTERIOVENOUS, UPPER EXTREMITY USING GORE 4-7MM X 45CM ACUSEAL VASCULAR GRAFT  x May stick graft on 02/11/2024          If you have any questions, please call the office at [phone].

## 2024-02-10 NOTE — TOC Transition Note (Signed)
 Transition of Care - Initial Contact after Hospitalization  Date of discharge: 02/10/2024  Date of contact: 02/10/24  Method: Phone Spoke to: Patient  Patient contacted to discuss transition of care from recent inpatient hospitalization. Patient was admitted to [facility] from 02/08/24-02/09/24 discharge diagnosis of HTN urgency and acute asthma exacerbation.   The discharge medication list was reviewed. Patient understands the changes and has no concerns.   Patient will return to his outpatient HD unit tomorrow at [facility].   S/p AVG placement today by Dr. Magda.  [name], NP

## 2024-02-10 NOTE — Progress Notes (Signed)
 Anesthesia Chart Review: Same day workup  53 year old male follows with cardiology for history of HTN, nonobstructive CAD, CVA, systolic heart failure with recovered EF 55-60% on last echo 12/25/2022.  Nuclear stress 12/2021 showed fixed perfusion defect, no current ischemia, high risk due to systolic dysfunction with EF 32%.  Last seen in follow-up by Katlyn West, NP on 06/12/2023 for preop evaluation prior to undergoing left ankle ORIF.  At that time his functional capacity was assessed at 7.44 METS and he was cleared to proceed with an RCRI of 11%.  He did subsequently undergo ORIF on 06/18/2023 without complication.  Patient is a current smoker with associated asthma/COPD and untreated OSA.  He presented to the ED on 02/08/2024 with complaint of shortness of breath.  He denied any chest pressure, fever, chills, cough.  He reported compliance with dialysis.  On evaluation in the ED he was found to be mildly tachypneic with diffuse expiratory wheezing and markedly hypertensive with blood pressure 209/112.  O2 saturations were 97% on room air.  CXR without acute abnormality.  COVID/flu/RSV negative.  Troponins were elevated with flat felt to be demand ischemia from severe hypertension/ESRD.  He was admitted overnight for management.  Hypertension treated with IV hydralazine .  Asthma/COPD exacerbation treated with IV Solu-Medrol  and nebulizers.  Per notes, symptoms are most completely resolved after high-dose steroids in the ER.  At discharge he had no wheezing or oxygen requirement.  He was given Medrol  Dosepak to complete outpatient.  He was also dialyzed prior to discharge as he has missed his chair time that morning.  IDDM2, A1c 7.0 on 11/13/2023.  Thoracic ascending aortic aneurysm, 4.2 cm by CTA 08/03/2021.  ESRD on HD Tuesday Thursday Saturday.  He has a left radiocephalic AV fistula.  CBC 02/09/2024 reviewed, chronic anemia hemoglobin 9.5, otherwise unremarkable.  Patient will need i-STAT day of  surgery.  EKG 02/08/2024: Sinus rhythm.  Rate 86. Nonspecific intraventricular conduction delay.  ST elevation, consider anterolateral injury  Echo 12/25/2022 1. Left ventricular ejection fraction, by estimation, is 55 to 60%. The  left ventricle has normal function. The left ventricle has no regional  wall motion abnormalities. There is severe concentric left ventricular  hypertrophy. Left ventricular diastolic   parameters are indeterminate.   2. Right ventricular systolic function is normal. The right ventricular  size is mildly enlarged. Tricuspid regurgitation signal is inadequate for  assessing PA pressure.   3. Left atrial size was mildly dilated.   4. The mitral valve is normal in structure. Trivial mitral valve  regurgitation. No evidence of mitral stenosis.   5. The aortic valve is normal in structure. Aortic valve regurgitation is  not visualized. No aortic stenosis is present.   6. The inferior vena cava is normal in size with greater than 50%  respiratory variability, suggesting right atrial pressure of 3 mmHg.   Nuclear stress 12/11/2021:    Findings are consistent with prior myocardial infarction. The study is high risk.   No ST deviation was noted.   LV perfusion is abnormal. There is no evidence of ischemia. There is evidence of infarction. Defect 1: There is a small defect with mild reduction in uptake present in the apical inferior and apex location(s) that is fixed. There is abnormal wall motion in the defect area. Consistent with infarction.   Left ventricular function is abnormal. Global function is moderately reduced. Nuclear stress EF: 32 %. The left ventricular ejection fraction is moderately decreased (30-44%). End diastolic cavity size is severely enlarged.  End systolic cavity size is severely enlarged.   Prior study not available for comparison.   Fixed perfusion defect at apex and apical inferior wall, suggesting prior infarct vs artifact. Moderate LV systolic  dysfunction (EF 32%) and severe LV dilatation High risk study due to systolic dysfunction.  No ischemia     Lynwood Geofm RIGGERS [facility] Short Stay Center/Anesthesiology Phone [phone] 02/10/2024 8:12 AM

## 2024-02-10 NOTE — Anesthesia Preprocedure Evaluation (Addendum)
 Anesthesia Evaluation  Patient identified by MRN, date of birth, ID band Patient awake    Reviewed: Allergy & Precautions, NPO status , Patient's Chart, lab work & pertinent test results  Airway Mallampati: III  TM Distance: >3 FB Neck ROM: Full    Dental no notable dental hx. (+) Dental Advisory Given   Pulmonary asthma , sleep apnea , Current Smoker   breath sounds clear to auscultation       Cardiovascular hypertension, + CAD, + Past MI and +CHF   Rhythm:Regular Rate:Normal  TTE (2024):  1. Left ventricular ejection fraction, by estimation, is 55 to 60%. The  left ventricle has normal function. The left ventricle has no regional  wall motion abnormalities. There is severe concentric left ventricular  hypertrophy. Left ventricular diastolic   parameters are indeterminate.   2. Right ventricular systolic function is normal. The right ventricular  size is mildly enlarged. Tricuspid regurgitation signal is inadequate for  assessing PA pressure.   3. Left atrial size was mildly dilated.   4. The mitral valve is normal in structure. Trivial mitral valve  regurgitation. No evidence of mitral stenosis.   5. The aortic valve is normal in structure. Aortic valve regurgitation is  not visualized. No aortic stenosis is present.   6. The inferior vena cava is normal in size with greater than 50%  respiratory variability, suggesting right atrial pressure of 3 mmHg.     Neuro/Psych  PSYCHIATRIC DISORDERS Anxiety        GI/Hepatic   Endo/Other  diabetes, Type 2    Renal/GU ESRFRenal disease     Musculoskeletal   Abdominal   Peds  Hematology  (+) Blood dyscrasia, anemia   Anesthesia Other Findings   Reproductive/Obstetrics                              Anesthesia Physical Anesthesia Plan  ASA: 3  Anesthesia Plan: MAC and Regional   Post-op Pain Management:    Induction: Intravenous  PONV  Risk Score and Plan: 1  Airway Management Planned: Natural Airway and Simple Face Mask  Additional Equipment:   Intra-op Plan:   Post-operative Plan:   Informed Consent:      Dental advisory given  Plan Discussed with: CRNA and Surgeon  Anesthesia Plan Comments: (PAT note by Lynwood Hope, PA-C: 53 year old male follows with cardiology for history of HTN, nonobstructive CAD, CVA, systolic heart failure with recovered EF 55-60% on last echo 12/25/2022.  Nuclear stress 12/2021 showed fixed perfusion defect, no current ischemia, high risk due to systolic dysfunction with EF 32%.  Last seen in follow-up by Katlyn West, NP on 06/12/2023 for preop evaluation prior to undergoing left ankle ORIF.  At that time his functional capacity was assessed at 7.44 METS and he was cleared to proceed with an RCRI of 11%.  He did subsequently undergo ORIF on 06/18/2023 without complication.  Patient is a current smoker with associated asthma/COPD and untreated OSA.  He presented to the ED on 02/08/2024 with complaint of shortness of breath.  He denied any chest pressure, fever, chills, cough.  He reported compliance with dialysis.  On evaluation in the ED he was found to be mildly tachypneic with diffuse expiratory wheezing and markedly hypertensive with blood pressure 209/112.  O2 saturations were 97% on room air.  CXR without acute abnormality.  COVID/flu/RSV negative.  Troponins were elevated with flat felt to be demand ischemia from severe hypertension/ESRD.  He was admitted overnight for management.  Hypertension treated with IV hydralazine .  Asthma/COPD exacerbation treated with IV Solu-Medrol  and nebulizers.  Per notes, symptoms are most completely resolved after high-dose steroids in the ER.  At discharge he had no wheezing or oxygen requirement.  He was given Medrol  Dosepak to complete outpatient.  He was also dialyzed prior to discharge as he has missed his chair time that morning.  IDDM2, A1c 7.0 on  11/13/2023.  Thoracic ascending aortic aneurysm, 4.2 cm by CTA 08/03/2021.  ESRD on HD Tuesday Thursday Saturday.  He has a left radiocephalic AV fistula.  CBC 02/09/2024 reviewed, chronic anemia hemoglobin 9.5, otherwise unremarkable.  Patient will need i-STAT day of surgery.  EKG 02/08/2024: Sinus rhythm.  Rate 86. Nonspecific intraventricular conduction delay.  ST elevation, consider anterolateral injury  Echo 12/25/2022 1. Left ventricular ejection fraction, by estimation, is 55 to 60%. The  left ventricle has normal function. The left ventricle has no regional  wall motion abnormalities. There is severe concentric left ventricular  hypertrophy. Left ventricular diastolic  parameters are indeterminate.  2. Right ventricular systolic function is normal. The right ventricular  size is mildly enlarged. Tricuspid regurgitation signal is inadequate for  assessing PA pressure.  3. Left atrial size was mildly dilated.  4. The mitral valve is normal in structure. Trivial mitral valve  regurgitation. No evidence of mitral stenosis.  5. The aortic valve is normal in structure. Aortic valve regurgitation is  not visualized. No aortic stenosis is present.  6. The inferior vena cava is normal in size with greater than 50%  respiratory variability, suggesting right atrial pressure of 3 mmHg.   Nuclear stress 12/11/2021:    Findings are consistent with prior myocardial infarction. The study is high risk.   No ST deviation was noted.   LV perfusion is abnormal. There is no evidence of ischemia. There is evidence of infarction. Defect 1: There is a small defect with mild reduction in uptake present in the apical inferior and apex location(s) that is fixed. There is abnormal wall motion in the defect area. Consistent with infarction.   Left ventricular function is abnormal. Global function is moderately reduced. Nuclear stress EF: 32 %. The left ventricular ejection fraction is moderately  decreased (30-44%). End diastolic cavity size is severely enlarged. End systolic cavity size is severely enlarged.   Prior study not available for comparison.  1. Fixed perfusion defect at apex and apical inferior wall, suggesting prior infarct vs artifact. 2. Moderate LV systolic dysfunction (EF 32%) and severe LV dilatation 3. High risk study due to systolic dysfunction.  No ischemia   )         Anesthesia Quick Evaluation

## 2024-02-10 NOTE — Transfer of Care (Signed)
 Immediate Anesthesia Transfer of Care Note  Patient: Matthew Odom  Procedure(s) Performed: INSERTION, GRAFT, ARTERIOVENOUS, UPPER EXTREMITY USING GORE 4-7MM X 45CM ACUSEAL VASCULAR GRAFT (Left: Arm Lower)  Patient Location: PACU  Anesthesia Type:General  Level of Consciousness: drowsy  Airway & Oxygen Therapy: Patient Spontanous Breathing and Patient connected to face mask oxygen  Post-op Assessment: Report given to RN and Post -op Vital signs reviewed and stable  Post vital signs: Reviewed and stable  Last Vitals:  Vitals Value Taken Time  BP 103/62 02/10/24 14:55  Temp    Pulse 54 02/10/24 15:00  Resp 9 02/10/24 15:00  SpO2 100 % 02/10/24 15:00  Vitals shown include unfiled device data.  Last Pain:  Vitals:   02/10/24 1222  TempSrc:   PainSc: 0-No pain         Complications: No notable events documented.

## 2024-02-10 NOTE — Interval H&P Note (Signed)
 History and Physical Interval Note:  02/10/2024 1:05 PM  Matthew Odom  has presented today for surgery, with the diagnosis of ESRD.  The various methods of treatment have been discussed with the patient and family. After consideration of risks, benefits and other options for treatment, the patient has consented to  Procedure(s): INSERTION, GRAFT, ARTERIOVENOUS, UPPER EXTREMITY (Left) INSERTION OF DIALYSIS CATHETER (N/A) as a surgical intervention.  The patient's history has been reviewed, patient examined, no change in status, stable for surgery.  I have reviewed the patient's chart and labs.  Questions were answered to the patient's satisfaction.     Debby LOISE Robertson

## 2024-02-10 NOTE — Op Note (Signed)
 DATE OF SERVICE: 02/10/2024  PATIENT:  Matthew Odom  53 y.o. male  PRE-OPERATIVE DIAGNOSIS:  ESRD, painful left radiocephalic AVF  POST-OPERATIVE DIAGNOSIS:  Same  PROCEDURE:   Conversion of left forearm fistula to arteriovenous graft with 4-71mm accuseal  SURGEON:  Surgeons and Role:    * Magda Debby SAILOR, MD - Primary  ASSISTANT: Ahmed Holster, PA-C  An experienced assistant was required given the complexity of this procedure and the standard of surgical care. My assistant helped with exposure through counter tension, suctioning, ligation and retraction to better visualize the surgical field.  My assistant expedited sewing during the case by following my sutures. Wherever I use the term we in the report, my assistant actively helped me with that portion of the procedure.  ANESTHESIA:   local, regional, and MAC  EBL: min  BLOOD ADMINISTERED:none  DRAINS: none   LOCAL MEDICATIONS USED:  NONE  SPECIMEN:  none  COUNTS: confirmed correct.  TOURNIQUET:  none  PATIENT DISPOSITION:  PACU - hemodynamically stable.   Delay start of Pharmacological VTE agent (>24hrs) due to surgical blood loss or risk of bleeding: no  INDICATION FOR PROCEDURE: Matthew Odom is a 53 y.o. male with end-stage renal disease, dialyzing through a radiocephalic AV fistula.  Fistula is very painful to cannulate.  Multiple fistulogram's have been done and showed no technical problems with the fistula.. After careful discussion of risks, benefits, and alternatives the patient was offered conversion of the fistula to a graft.  The patient was unwilling to consider placement of a tunneled dialysis catheter. The patient understood and wished to proceed.  OPERATIVE FINDINGS: Successful conversion of left radiocephalic AV fistula to arteriovenous graft with AcuSeal graft.  DESCRIPTION OF PROCEDURE: After identification of the patient in the pre-operative holding area, the patient was transferred to the operating  room. The patient was positioned supine on the operating room table. Anesthesia was induced. The left arm was prepped and draped in standard fashion. A surgical pause was performed confirming correct patient, procedure, and operative location.  Small incision was made near the anastomosis in the left radial wrist to expose the fistula.  Several centimeters of the fistula was isolated using Bovie dissection.  Small incision was made in the proximal forearm over the course of the radiocephalic fistula to isolate the outflow.  The incision was carried down through subtenons tissue until the fistula was encircled.  Subcutaneous tunnel was used to connect the 2 vascular exposures.  Patient was systemically heparinized with 5000 units of IV heparin .  Clamps were applied to the fistula near its anastomosis both proximally and distally.  Fistula was transected.  A 4 to 7 mm AcuSeal graft was delivered through the subcutaneous tunnel.  The proximal aspect of the graft was cut for good size match.  Then an end-to-end anastomosis was performed from the existing fistula to the graft using 6-0 Prolene suture.  The distal cut end of the fistula was ligated with double layer Prolene suture.  Next, the outflow of the fistula was clamped proximally and distally and transected with Metzenbaum scissor.  The excluded fistula was then suture-ligated with 2 layer closure of 6-0 Prolene.  The distal aspect of the graft was then anastomosed end-to-end to the outflow of the fistula with 6-0 Prolene suture using running technique.  After completion the anastomosis was flushed and de-aired.  Clamps were released on the fistula.  Good flow was noted through the graft.  Satisfied we ended the case here.  The wounds were closed in layers using 3-0 Vicryl and 4-0 Monocryl.  Upon completion of the case instrument and sharps counts were confirmed correct. The patient was transferred to the PACU in good condition. I was present for all  portions of the procedure.  FOLLOW UP PLAN: Follow-up as needed for graft dysfunction.  Graft can be cannulated tomorrow with 14-gauge needles and heparinized circuit.  Debby SAILOR. Magda, MD [facility] Phone Number: [phone] 02/10/2024 3:14 PM

## 2024-02-10 NOTE — Discharge Planning (Signed)
 [facility] Patient Discharge Orders- [facility] CLINIC: [facility]  Patient's name: ROLIN SCHULT Admit/DC Dates: 02/08/24-02/09/24. Under observation  Discharge Diagnoses: HTN urgency Acute asthma exacerbation    Aranesp: Given: No    Last Hgb: 10.2 PRBC's Given: No  ESA dose for discharge: N/A IV Iron dose at discharge: Continue weekly Venofer   Heparin  change: No  EDW Change: No. Continue to push UF  [place]ath Change: No  Access intervention/Change: No  Calcitriol  change: No  Discharge Labs: Calcium  8.3   Albumin 4.1  K+ 4.3  IV Antibiotics: No  On Coumadin?: No    D/C Meds to be reconciled by nurse after every discharge.  Completed By: [name], NP   Reviewed by: MD:______ RN_______

## 2024-02-10 NOTE — Progress Notes (Signed)
 Orthopedic Tech Progress Note Patient Details:  Matthew Odom 27-Apr-1971 [id number]  Ortho Devices Type of Ortho Device: Shoulder immobilizer Ortho Device/Splint Location: Order by OBIE Kemps in PACU Ortho Device/Splint Interventions: Ordered   Post Interventions Patient Tolerated: Well  [name] 02/10/2024, 3:31 PM

## 2024-02-11 ENCOUNTER — Encounter (HOSPITAL_COMMUNITY): Payer: Self-pay | Admitting: Vascular Surgery

## 2024-02-15 ENCOUNTER — Telehealth: Payer: Self-pay

## 2024-02-15 NOTE — Telephone Encounter (Signed)
 Called pt to inform him Matthew Odom sent a referral in July to [facility] for pt. Pt states yes he went to that appointment but it wa in [facility] and he has transportations issues that will be a problem getting to [facility] or appointments. I let him know that [facility] has locations in [place] and to call them and request to be seen in [place] instead of [facility]. Pt expressed understanding.

## 2024-02-15 NOTE — Telephone Encounter (Signed)
 Copied from CRM [id number]. Topic: Referral - Request for Referral >> Feb 15, 2024  3:49 PM Myrick T wrote: Did the patient discuss referral with their provider in the last year? Yes  Appointment offered? No  Type of order/referral and detailed reason for visit: Pain Management  Preference of office, provider, location: [address] or [place], [place]  If referral order, have you been seen by this specialty before? No (If Yes, this issue or another issue? When? Where?  Can we respond through MyChart? Yes

## 2024-02-16 DIAGNOSIS — I10 Essential (primary) hypertension: Secondary | ICD-10-CM | POA: Diagnosis not present

## 2024-02-16 DIAGNOSIS — Z992 Dependence on renal dialysis: Secondary | ICD-10-CM | POA: Diagnosis not present

## 2024-02-16 DIAGNOSIS — N186 End stage renal disease: Secondary | ICD-10-CM | POA: Diagnosis not present

## 2024-02-16 DIAGNOSIS — N2581 Secondary hyperparathyroidism of renal origin: Secondary | ICD-10-CM | POA: Diagnosis not present

## 2024-02-16 DIAGNOSIS — E1129 Type 2 diabetes mellitus with other diabetic kidney complication: Secondary | ICD-10-CM | POA: Diagnosis not present

## 2024-02-18 ENCOUNTER — Emergency Department (HOSPITAL_COMMUNITY)
Admission: EM | Admit: 2024-02-18 | Discharge: 2024-02-18 | Discharge status: Against Medical Advice | Attending: Emergency Medicine | Admitting: Emergency Medicine

## 2024-02-18 ENCOUNTER — Emergency Department (HOSPITAL_COMMUNITY)

## 2024-02-18 ENCOUNTER — Telehealth (HOSPITAL_COMMUNITY): Payer: Self-pay

## 2024-02-18 ENCOUNTER — Other Ambulatory Visit: Payer: Self-pay

## 2024-02-18 DIAGNOSIS — Y828 Other medical devices associated with adverse incidents: Secondary | ICD-10-CM | POA: Diagnosis not present

## 2024-02-18 DIAGNOSIS — Z5321 Procedure and treatment not carried out due to patient leaving prior to being seen by health care provider: Secondary | ICD-10-CM | POA: Diagnosis not present

## 2024-02-18 DIAGNOSIS — Z794 Long term (current) use of insulin: Secondary | ICD-10-CM | POA: Diagnosis not present

## 2024-02-18 DIAGNOSIS — Z5189 Encounter for other specified aftercare: Secondary | ICD-10-CM | POA: Diagnosis not present

## 2024-02-18 DIAGNOSIS — M25832 Other specified joint disorders, left wrist: Secondary | ICD-10-CM | POA: Diagnosis not present

## 2024-02-18 DIAGNOSIS — Z4801 Encounter for change or removal of surgical wound dressing: Secondary | ICD-10-CM | POA: Diagnosis not present

## 2024-02-18 DIAGNOSIS — T82838A Hemorrhage of vascular prosthetic devices, implants and grafts, initial encounter: Secondary | ICD-10-CM | POA: Diagnosis not present

## 2024-02-18 NOTE — Telephone Encounter (Signed)
 Has a surgical wound dehiscence and has been referred to the ED after dialysis, for urgent trip to the OR for repair.

## 2024-02-18 NOTE — ED Triage Notes (Signed)
 No bleeding at arrival/dressing intact .

## 2024-02-18 NOTE — ED Triage Notes (Signed)
 Dialysis sent patient over to get stitches put back in. Patient states that when the HD nurse lfushed his graft with heparin  it blew the stiches out.

## 2024-02-18 NOTE — ED Notes (Signed)
 Pt left due to wait time. Pt seen leaving ED with wife.

## 2024-02-19 ENCOUNTER — Encounter (HOSPITAL_BASED_OUTPATIENT_CLINIC_OR_DEPARTMENT_OTHER): Payer: Self-pay

## 2024-02-19 ENCOUNTER — Emergency Department (HOSPITAL_BASED_OUTPATIENT_CLINIC_OR_DEPARTMENT_OTHER)
Admission: EM | Admit: 2024-02-19 | Discharge: 2024-02-19 | Disposition: A | Discharge status: Home / Self Care | Source: Home / Self Care | Attending: Emergency Medicine | Admitting: Emergency Medicine

## 2024-02-19 DIAGNOSIS — G8929 Other chronic pain: Secondary | ICD-10-CM | POA: Diagnosis not present

## 2024-02-19 DIAGNOSIS — M25572 Pain in left ankle and joints of left foot: Secondary | ICD-10-CM | POA: Diagnosis not present

## 2024-02-19 DIAGNOSIS — Z5189 Encounter for other specified aftercare: Secondary | ICD-10-CM

## 2024-02-19 NOTE — ED Triage Notes (Signed)
 Pt states that his fistula has been bleeding and he needs some stiches in it, bleeding controlled at this time with dressing

## 2024-02-19 NOTE — ED Provider Notes (Signed)
 [facility] EMERGENCY DEPARTMENT AT [facility] Provider Note   CSN: [id number] Arrival date & time: 02/18/24  2357     Patient presents with: Vascular Access Problem   Matthew Odom is a 53 y.o. male.   HPI     This a 53 year old male who presents with concern for bleeding from his dialysis graft.  Patient reports he has had issues with fistula in the left forearm.  He had graft placement on 10/8.  This was accessed for dialysis yesterday.  He states that when they injected the heparin  he had what appeared to be infiltration and swelling at the site.  He states that this caused the sutures distal to the injection site to be pop.  There was some bleeding but he has not had any ongoing bleeding since dialysis.  He did not call his vascular surgeon.  He did have a full dialysis session.  Prior to Admission medications   Medication Sig Start Date End Date Taking? Authorizing Provider  acetaminophen  (TYLENOL ) 500 MG tablet Take 500-1,000 mg by mouth every 6 (six) hours as needed (pain.).    [provider]  albuterol  (PROVENTIL ) (2.5 MG/3ML) 0.083% nebulizer solution Take 3 mLs (2.5 mg total) by nebulization every 6 (six) hours as needed for wheezing or shortness of breath. 02/02/24   Adrien Winfred Berke, MD  albuterol  (PROVENTIL ) (2.5 MG/3ML) 0.083% nebulizer solution Take 3 mLs (2.5 mg total) by nebulization every 6 (six) hours as needed for wheezing or shortness of breath. 02/02/24   Adrien Winfred Berke, MD  albuterol  (VENTOLIN  HFA) 108 [phone] Base) MCG/ACT inhaler Inhale 2 puffs into the lungs every 6 (six) hours as needed for wheezing or shortness of breath. 02/02/24   Adrien Winfred Berke, MD  amLODipine  (NORVASC ) 10 MG tablet Take 1 tablet (10 mg total) by mouth daily. 11/13/23   Lorren Greig PARAS, NP  aspirin  EC 81 MG tablet Take one tablet by mouth twice daily for 30 days after sugery for blood clot prevention. Start post op day 1. Swallow whole. 11/13/23    Lorren Greig PARAS, NP  atorvastatin  (LIPITOR) 80 MG tablet Take 1 tablet (80 mg total) by mouth daily. Patient not taking: Reported on 02/02/2024 11/13/23 02/11/24  Lorren Greig PARAS, NP  budesonide -formoterol  (SYMBICORT ) 160-4.5 MCG/ACT inhaler Inhale 2 puffs into the lungs 2 (two) times daily. 02/02/24   Adrien Winfred Berke, MD  calcium  acetate (PHOSLO ) 667 MG capsule Take 2 capsules (1,334 mg total) by mouth 3 (three) times daily with meals 06/25/21     calcium  carbonate (TUMS) 500 MG chewable tablet Chew 1 tablet (200 mg of elemental calcium  total) by mouth 3 (three) times daily with meals. 11/13/23   Lorren Greig PARAS, NP  carvedilol  (COREG ) 25 MG tablet Take 1 tablet (25 mg total) by mouth 2 (two) times daily with a meal. 11/13/23   Lorren Greig PARAS, NP  cloNIDine  (CATAPRES ) 0.1 MG tablet Take 1 tablet (0.1 mg total) by mouth 3 (three) times daily. 02/09/24   Dennise Lavada POUR, MD  doxazosin  (CARDURA ) 4 MG tablet Take 3 tablets (12 mg total) by mouth at bedtime. 11/13/23   Lorren Greig PARAS, NP  doxazosin  (CARDURA ) 8 MG tablet Take 1 tablet (8 mg total) by mouth daily. 12/05/22   O'NealDarryle Ned, MD  hydrALAZINE  (APRESOLINE ) 100 MG tablet Take 1 tablet (100 mg total) by mouth 3 (three) times daily. 11/13/23   Lorren Greig PARAS, NP  Insulin  Glargine (BASAGLAR  KWIKPEN) 100 UNIT/ML Inject 20 Units  into the skin daily. 11/13/23   Lorren Greig PARAS, NP  insulin  lispro (HUMALOG ) 100 UNIT/ML injection Inject 0-0.04 mLs (0-4 Units total) into the skin 3 (three) times daily as needed for high blood sugar. 11/13/23   Lorren Greig PARAS, NP  Insulin  Pen Needle 31G X 6 MM MISC Use as directed in the morning, at noon, in the evening, and at bedtime. 11/13/23   Lorren Greig PARAS, NP  liraglutide  (VICTOZA ) 18 MG/3ML SOPN Inject 1.8 mg into the skin daily. 11/13/23   Lorren Greig PARAS, NP  methylPREDNISolone  (MEDROL  DOSEPAK) 4 MG TBPK tablet follow package directions 02/09/24   Singh, Prashant K, MD  nitroGLYCERIN  (NITROSTAT ) 0.4 MG SL  tablet Place 1 tablet (0.4 mg total) under the tongue every 5 (five) minutes as needed for chest pain. 11/13/23   Lorren Greig PARAS, NP  oxyCODONE  (ROXICODONE ) 5 MG immediate release tablet Take 1 tablet (5 mg total) by mouth every 6 (six) hours as needed for severe pain (pain score 7-10). 02/10/24   Schuh, McKenzi P, PA-C  sertraline  (ZOLOFT ) 25 MG tablet Take 1 tablet (25 mg total) by mouth daily. 11/13/23   Lorren Greig PARAS, NP  tiotropium (SPIRIVA) 18 MCG inhalation capsule Place 1 capsule (18 mcg total) into inhaler and inhale daily. 02/02/24   Adrien Winfred Berke, MD    Allergies: Aspirin  and Nitroglycerin     Review of Systems  Constitutional:  Negative for fever.  Respiratory:  Negative for shortness of breath.   Cardiovascular:  Negative for chest pain.  Skin:  Positive for wound.  All other systems reviewed and are negative.   Updated Vital Signs BP (!) 189/89   Pulse 97   Temp 98.8 F (37.1 C)   Resp 20   SpO2 94%   Physical Exam Vitals and nursing note reviewed.  Constitutional:      Appearance: He is well-developed. He is obese. He is not ill-appearing.  HENT:     Head: Normocephalic and atraumatic.  Eyes:     Pupils: Pupils are equal, round, and reactive to light.  Cardiovascular:     Rate and Rhythm: Normal rate and regular rhythm.  Pulmonary:     Effort: Pulmonary effort is normal. No respiratory distress.  Abdominal:     Palpations: Abdomen is soft.  Musculoskeletal:     Comments: Left forearm with both fistula and graft noted, both with positive thrill, there is some swelling and induration distal aspect of the graft, suture site without active bleeding and no significant dehiscence  Skin:    General: Skin is warm and dry.  Neurological:     Mental Status: He is alert and oriented to person, place, and time.  Psychiatric:        Mood and Affect: Mood normal.     (all labs ordered are listed, but only abnormal results are displayed) Labs Reviewed - No  data to display  EKG: None  Radiology: DG Wrist Complete Left Result Date: 02/18/2024 CLINICAL DATA:  Av graft bleeding EXAM: LEFT WRIST - COMPLETE 3+ VIEW COMPARISON:  None Available. FINDINGS: Vascular calcifications. AV graft partially visualized. No acute bony abnormality. Specifically, no fracture, subluxation, or dislocation. IMPRESSION: No acute bony abnormality. Diffuse vascular calcifications. Electronically Signed   By: Franky Crease M.D.   On: 02/18/2024 22:01     Procedures   Medications Ordered in the ED - No data to display  Medical Decision Making  This patient presents to the ED for concern of bleeding, this involves an extensive number of treatment options, and is a complaint that carries with it a high risk of complications and morbidity.  I considered the following differential and admission for this acute, potentially life threatening condition.  The differential diagnosis includes bleeding, graft disruption  MDM:    This is a 53 year old male who presents with concern for suture and dehiscence of a graft site.  He is nontoxic and vital signs are notable for blood pressure 189/89.  No active bleeding noted.  Has not bled since dialysis earlier today.  Does have some evidence of infiltration in the lower aspect of the graft.  Both the fistula and the graft have a positive thrill.  The wound site is clean and without bleeding.  Discussed with the patient that I would not advise placing new stitches as it would increase risk for infection.  I do advise that he needs to follow-up closely with his vascular surgeon given concern for infiltration of heparin  during his dialysis session.  Patient stated understanding.  (Labs, imaging, consults)  Labs: I Ordered, and personally interpreted labs.  The pertinent results include: None  Imaging Studies ordered: I ordered imaging studies including none I independently visualized and interpreted  imaging. I agree with the radiologist interpretation  Additional history obtained from review.  External records from outside source obtained and reviewed including prior evaluations  Cardiac Monitoring: The patient was not maintained on a cardiac monitor.  If on the cardiac monitor, I personally viewed and interpreted the cardiac monitored which showed an underlying rhythm of: N/A  Reevaluation: After the interventions noted above, I reevaluated the patient and found that they have :stayed the same  Social Determinants of Health:  lives independently  Disposition: Discharge  Co morbidities that complicate the patient evaluation  Past Medical History:  Diagnosis Date   Anxiety    BPH (benign prostatic hyperplasia)    Cocaine use    07/15/21- last time was more than 10 years ago.   Complication of anesthesia    wakes up during surgery   COPD (chronic obstructive pulmonary disease) (HCC)    Coronary artery disease 2021   mild non obstructed   Diabetes mellitus without complication (HCC)    Enlarged heart    ESRD (end stage renal disease) (HCC)    TTHSAT   History of degenerative disc disease    Hyperlipidemia    Hypertension    NSTEMI (non-ST elevated myocardial infarction) (HCC)    Pneumonia    Sciatic nerve pain    Sleep apnea    Stroke (HCC)    no residual, x 3 last one was in 2020   Thoracic ascending aortic aneurysm    4.2 cm 08/03/21 CTA chest     Medicines No orders of the defined types were placed in this encounter.   I have reviewed the patients home medicines and have made adjustments as needed  Problem List / ED Course: Problem List Items Addressed This Visit   None Visit Diagnoses       Visit for wound check    -  Primary                Final diagnoses:  Visit for wound check    ED Discharge Orders     None          Bari Charmaine FALCON, MD 02/19/24 [phone]

## 2024-02-19 NOTE — ED Notes (Signed)
Patient left before discharge papers could be given.

## 2024-02-19 NOTE — Discharge Instructions (Signed)
 As long as the wound is not bleeding, keep bandage in place.  Follow-up closely with vascular surgery first thing in the morning given the infiltration that happened at dialysis yesterday.

## 2024-02-22 ENCOUNTER — Ambulatory Visit: Attending: Nephrology | Admitting: Physician Assistant

## 2024-02-22 VITALS — BP 184/100 | HR 76 | Temp 98.4°F | Ht 71.0 in | Wt 256.0 lb

## 2024-02-22 DIAGNOSIS — N186 End stage renal disease: Secondary | ICD-10-CM

## 2024-02-22 NOTE — Progress Notes (Signed)
    Postoperative Access Visit   History of Present Illness   Matthew Odom is a 54 y.o. year old male who presents for postoperative follow-up for:  Left forearm to arteriovenous accuseal graft 02/22/24. He had some bleeding issues on 10/16 when he was at HD when the RN flushed his graft with heparin  and the stitches came out.He ended up leaving AMA due to the wait time but then returned in the early hours of 10/17 with continued bleeding. There was some noted area of infiltration around the graft, no active bleeding though. The graft was used for HD without issues.   Today he says the arm is still very swollen and tight. This has improved a little since he has been elevating his arm. He says arm is very sore. Denies any numbness, weakness or pain in left hand. No bleeding over weekend. He is currently dialyzing on TTS at the [facility]  Physical Examination   Vitals:   02/22/24 1245  BP: (!) 184/100  Pulse: 76  Temp: 98.4 F (36.9 C)  SpO2: 97%  Weight: 256 lb (116.1 kg)  Height: 5' 11 (1.803 m)   Body mass index is 35.7 kg/m.  left arm Incision is intact. No active bleeding.  2+ radial pulse, hand grip is 5/5, sensation in digits is intact, palpable thrill, bruit can be auscultated       Medical Decision Making   Matthew Odom is a 53 y.o. year old male who presents s/p Left forearm to arteriovenous accuseal graft 02/22/24.Incisions are intact and well appearing. No active bleeding. Left forearm and hand are edematous. There is good thrill in the graft. Arm is warm and well perfused with palpable radial pulse. Patent is without signs or symptoms of steal syndrome. I discussed with him that the swelling is normal considering he has had several surgeries in the left arm now. He also had some infiltration contributing to the swelling. I have encouraged him to continue to elevate the arm. We discussed temporarily placing a TDC to allow arm to heal vs continued use of the  graft. He would like to avoid placement of a TDC. I suspect the arm will continue to feel better in the coming days. He knows to call for follow up if there are any issues with how his arm feels or any malfunction of the graft itself. He can otherwise follow up as needed.   Teretha Damme, PA-C [facility] [place] Office: [phone]  On call MD:  [place]

## 2024-02-23 ENCOUNTER — Other Ambulatory Visit: Payer: Self-pay

## 2024-02-23 MED ORDER — ROPINIROLE HCL 0.25 MG PO TABS
0.2500 mg | ORAL_TABLET | Freq: Every evening | ORAL | 3 refills | Status: AC | PRN
  Filled 2024-02-23: qty 180, 90d supply, fill #0

## 2024-02-24 ENCOUNTER — Other Ambulatory Visit: Payer: Self-pay

## 2024-02-25 DIAGNOSIS — Z992 Dependence on renal dialysis: Secondary | ICD-10-CM | POA: Diagnosis not present

## 2024-02-25 DIAGNOSIS — N2581 Secondary hyperparathyroidism of renal origin: Secondary | ICD-10-CM | POA: Diagnosis not present

## 2024-02-27 DIAGNOSIS — N186 End stage renal disease: Secondary | ICD-10-CM | POA: Diagnosis not present

## 2024-02-27 DIAGNOSIS — N2581 Secondary hyperparathyroidism of renal origin: Secondary | ICD-10-CM | POA: Diagnosis not present

## 2024-02-27 DIAGNOSIS — Z992 Dependence on renal dialysis: Secondary | ICD-10-CM | POA: Diagnosis not present

## 2024-03-01 DIAGNOSIS — Z992 Dependence on renal dialysis: Secondary | ICD-10-CM | POA: Diagnosis not present

## 2024-03-01 DIAGNOSIS — N2581 Secondary hyperparathyroidism of renal origin: Secondary | ICD-10-CM | POA: Diagnosis not present

## 2024-03-03 ENCOUNTER — Other Ambulatory Visit: Payer: Self-pay

## 2024-03-03 ENCOUNTER — Emergency Department (HOSPITAL_COMMUNITY)

## 2024-03-03 ENCOUNTER — Encounter (HOSPITAL_COMMUNITY): Payer: Self-pay

## 2024-03-03 ENCOUNTER — Emergency Department (HOSPITAL_COMMUNITY)
Admission: EM | Admit: 2024-03-03 | Discharge: 2024-03-03 | Disposition: A | Discharge status: Home / Self Care | Attending: Emergency Medicine | Admitting: Emergency Medicine

## 2024-03-03 DIAGNOSIS — I5030 Unspecified diastolic (congestive) heart failure: Secondary | ICD-10-CM | POA: Diagnosis not present

## 2024-03-03 DIAGNOSIS — R61 Generalized hyperhidrosis: Secondary | ICD-10-CM | POA: Diagnosis not present

## 2024-03-03 DIAGNOSIS — N186 End stage renal disease: Secondary | ICD-10-CM | POA: Insufficient documentation

## 2024-03-03 DIAGNOSIS — Z992 Dependence on renal dialysis: Secondary | ICD-10-CM | POA: Diagnosis not present

## 2024-03-03 DIAGNOSIS — I132 Hypertensive heart and chronic kidney disease with heart failure and with stage 5 chronic kidney disease, or end stage renal disease: Secondary | ICD-10-CM | POA: Insufficient documentation

## 2024-03-03 DIAGNOSIS — I2510 Atherosclerotic heart disease of native coronary artery without angina pectoris: Secondary | ICD-10-CM | POA: Diagnosis not present

## 2024-03-03 DIAGNOSIS — R9431 Abnormal electrocardiogram [ECG] [EKG]: Secondary | ICD-10-CM | POA: Diagnosis not present

## 2024-03-03 DIAGNOSIS — R0789 Other chest pain: Secondary | ICD-10-CM | POA: Diagnosis not present

## 2024-03-03 DIAGNOSIS — J449 Chronic obstructive pulmonary disease, unspecified: Secondary | ICD-10-CM | POA: Diagnosis not present

## 2024-03-03 DIAGNOSIS — R42 Dizziness and giddiness: Secondary | ICD-10-CM | POA: Diagnosis not present

## 2024-03-03 DIAGNOSIS — R079 Chest pain, unspecified: Secondary | ICD-10-CM | POA: Diagnosis not present

## 2024-03-03 DIAGNOSIS — E1122 Type 2 diabetes mellitus with diabetic chronic kidney disease: Secondary | ICD-10-CM | POA: Insufficient documentation

## 2024-03-03 DIAGNOSIS — R55 Syncope and collapse: Secondary | ICD-10-CM | POA: Diagnosis not present

## 2024-03-03 DIAGNOSIS — R531 Weakness: Secondary | ICD-10-CM | POA: Diagnosis not present

## 2024-03-03 LAB — CBC
HCT: 32 % — ABNORMAL LOW (ref 39.0–52.0)
Hemoglobin: 10.2 g/dL — ABNORMAL LOW (ref 13.0–17.0)
MCH: 27.9 pg (ref 26.0–34.0)
MCHC: 31.9 g/dL (ref 30.0–36.0)
MCV: 87.4 fL (ref 80.0–100.0)
Platelets: 181 K/uL (ref 150–400)
RBC: 3.66 MIL/uL — ABNORMAL LOW (ref 4.22–5.81)
RDW: 15.6 % — ABNORMAL HIGH (ref 11.5–15.5)
WBC: 6.8 K/uL (ref 4.0–10.5)
nRBC: 0 % (ref 0.0–0.2)

## 2024-03-03 LAB — I-STAT VENOUS BLOOD GAS, ED
Acid-base deficit: 1 mmol/L (ref 0.0–2.0)
Bicarbonate: 25.3 mmol/L (ref 20.0–28.0)
Calcium, Ion: 1.1 mmol/L — ABNORMAL LOW (ref 1.15–1.40)
HCT: 32 % — ABNORMAL LOW (ref 39.0–52.0)
Hemoglobin: 10.9 g/dL — ABNORMAL LOW (ref 13.0–17.0)
O2 Saturation: 81 %
Potassium: 4.1 mmol/L (ref 3.5–5.1)
Sodium: 136 mmol/L (ref 135–145)
TCO2: 27 mmol/L (ref 22–32)
pCO2, Ven: 45.4 mmHg (ref 44–60)
pH, Ven: 7.353 (ref 7.25–7.43)
pO2, Ven: 48 mmHg — ABNORMAL HIGH (ref 32–45)

## 2024-03-03 LAB — HEPATIC FUNCTION PANEL
ALT: 9 U/L (ref 0–44)
AST: <10 U/L — ABNORMAL LOW (ref 15–41)
Albumin: 3.3 g/dL — ABNORMAL LOW (ref 3.5–5.0)
Alkaline Phosphatase: 50 U/L (ref 38–126)
Bilirubin, Direct: <0.1 mg/dL (ref 0.0–0.2)
Total Bilirubin: 0.7 mg/dL (ref 0.0–1.2)
Total Protein: 6.6 g/dL (ref 6.5–8.1)

## 2024-03-03 LAB — BASIC METABOLIC PANEL WITH GFR
Anion gap: 20 — ABNORMAL HIGH (ref 5–15)
BUN: 57 mg/dL — ABNORMAL HIGH (ref 6–20)
CO2: 23 mmol/L (ref 22–32)
Calcium: 8.5 mg/dL — ABNORMAL LOW (ref 8.9–10.3)
Chloride: 95 mmol/L — ABNORMAL LOW (ref 98–111)
Creatinine, Ser: 10.83 mg/dL — ABNORMAL HIGH (ref 0.61–1.24)
GFR, Estimated: 5 mL/min — ABNORMAL LOW (ref >60)
Glucose, Bld: 201 mg/dL — ABNORMAL HIGH (ref 70–99)
Potassium: 3.9 mmol/L (ref 3.5–5.1)
Sodium: 138 mmol/L (ref 135–145)

## 2024-03-03 LAB — TROPONIN I (HIGH SENSITIVITY)
Troponin I (High Sensitivity): 130 ng/L (ref <18)
Troponin I (High Sensitivity): 117 ng/L (ref <18)

## 2024-03-03 NOTE — Discharge Instructions (Signed)
 Matthew Odom:  Thank you for allowing us  to take care of you today.  We hope you begin feeling better soon. You were seen today for an episode of chest pain.  While you were here we performed lab work, imaging, EKG.  You did have an elevation in your troponin which you do have at baseline (this is an enzyme that your heart gives off and there is damage to it).  Your troponin was downtrending which means that there is no acute emergent cause of your symptoms today including a heart attack.  Given that you have been having this intermittent pain, a cardiology referral has been placed, I recommend that you follow-up with your cardiologist within the next week for evaluation and potential stress test.  To-Do:  Please follow-up with your primary doctor within the next 2-3 days. It is important that you review any labs or imaging results (if any) that you had today with them. Your preliminary imaging results (if any) are attached. Please return to the Emergency Department or call 911 if you experience chest pain, shortness of breath, severe pain, severe fever, altered mental status, or have any reason to think that you need emergency medical care.  Thank you again.  Hope you feel better soon.  Department of Emergency Medicine

## 2024-03-03 NOTE — ED Notes (Signed)
 CCMD notified

## 2024-03-03 NOTE — ED Triage Notes (Signed)
 Pt bib ems from parking lot c/o cp, dizziness, diaphoresis, and near syncopal. Pt was taking his daughter to work when he arrived in the parking lot he had hot flashes, lump sensation midsternum area, dizziness, and near syncopal episode.   Hx HTN HD(T/TH/S) DM2   BP 122/70 RA 99% CBG 227  EMS noted depressions in EKG  20G RT hand

## 2024-03-03 NOTE — ED Provider Notes (Signed)
 [place] EMERGENCY DEPARTMENT AT [facility] Provider Note  MDM   HPI/ROS:  Matthew Odom is a 53 y.o. male with a PMHx ESRD on dialysis on TTS schedule, coronary artery disease, Asthma, hypertension, diabetes type 2, HFpEF who presented to the emergency department with an episode central chest pain.  He endorses that he was in a parking lot after dropping his daughter off and had an episode where he was in the car of dizziness, diaphoresis, and hot flash with central chest pressure.  He states that he then got out of the car and sat down and the hot flashes, dizziness have resolved.  He states that the central chest pressure took a bit longer to resolve but he does not currently feel it now.  He denies any missed dialysis sessions any recent sick symptoms, denies fevers, chills, shortness of breath.  DDx includes but is not limited to ACS, CHF exacerbation, COPD exacerbation, infection/sepsis, syncope  On my initial evaluation, patient is:  -Vital signs stable. Patient afebrile, hemodynamically stable, and non-toxic appearing.  Physical exam is notable for: - No evidence of volume overload, no lower extremity edema, left forearm fistula with palpable thrill. -- Lungs CTAB without wheezing or increased WOB, saturating well on room air, respirations of 12 -- Abdomen is soft, nontender, nondistended.  EKG with a heart rate of 79, normal axis and intervals all WNL, some lateral Q waves and mild ST depressions in anterolateral leads (improved from prior EKG), EKG largely unchanged from prior visit. BMP with elevated BUN/creatinine in the setting of ESRD, anion gap of 20.  CBC with hemoglobin of 10.2 (from patient's baseline).  Initial troponin of 138 (patient's last troponin is 105 3 weeks prior). CXR with cardiomegaly unchanged from prior, no acute cardiopulmonary process.  This patient's current presentation, including their history and physical exam, is most consistent with ***.  Differentials include ***.     Interpretations, interventions, and the patient's course of care are documented below.    Clinical Course as of 03/03/24 0743  Thu Mar 03, 2024  0742 EKG with a heart rate of 79, normal axis, intervals all WNL, lateral Q waves [SA]    Clinical Course User Index [SA] Billy Pal, MD   ***   Disposition:  {ED Dispo:29898}  Clinical Impression: No diagnosis found.  Rx / DC Orders ED Discharge Orders     None       The plan for this patient was discussed with Dr. ***, who voiced agreement and who oversaw evaluation and treatment of this patient.   Clinical Complexity A medically appropriate history, review of systems, and physical exam was performed.  My independent interpretations of EKG, labs, and radiology are documented in the ED course above.   If decision rules were used in this patient's evaluation, they are listed below.  *** Click here for ABCD2, HEART and other calculatorsREFRESH Note before signing   Patient's presentation is most consistent with {EM COPA:27473}  Medical Decision Making Amount and/or Complexity of Data Reviewed Labs: ordered. Radiology: ordered.    HPI/ROS      See MDM section for pertinent HPI and ROS. A complete ROS was performed with pertinent positives/negatives noted above.   Past Medical History:  Diagnosis Date   Anxiety    BPH (benign prostatic hyperplasia)    Cocaine use    07/15/21- last time was more than 10 years ago.   Complication of anesthesia    wakes up during surgery   COPD (  chronic obstructive pulmonary disease) (HCC)    Coronary artery disease 2021   mild non obstructed   Diabetes mellitus without complication (HCC)    Enlarged heart    ESRD (end stage renal disease) (HCC)    TTHSAT   History of degenerative disc disease    Hyperlipidemia    Hypertension    NSTEMI (non-ST elevated myocardial infarction) (HCC)    Pneumonia    Sciatic nerve pain    Sleep apnea     Stroke (HCC)    no residual, x 3 last one was in 2020   Thoracic ascending aortic aneurysm    4.2 cm 08/03/21 CTA chest    Past Surgical History:  Procedure Laterality Date   A/V SHUNT INTERVENTION Left 11/23/2023   Procedure: A/V SHUNT INTERVENTION;  Surgeon: Magda Debby SAILOR, MD;  Location: [facility];  Service: Cardiovascular;  Laterality: Left;   A/V SHUNT INTERVENTION Left 12/25/2023   Procedure: A/V SHUNT INTERVENTION;  Surgeon: Gretta Lonni PARAS, MD;  Location: [facility];  Service: Cardiovascular;  Laterality: Left;   ANKLE ARTHROSCOPY Left 06/18/2023   Procedure: LEFT ANKLE ARTHROSCOPIC DEBRIDEMENT;  Surgeon: Elsa Lonni SAUNDERS, MD;  Location: [facility];  Service: Orthopedics;  Laterality: Left;   AV FISTULA PLACEMENT Left 07/17/2021   Procedure: CREATION  OF LEFT ARM RADIOCEPHALIC ARTERIOVENOUS (AV) FISTULA;  Surgeon: Serene Gaile ORN, MD;  Location: [facility];  Service: Vascular;  Laterality: Left;   CARDIAC CATHETERIZATION     INSERTION OF ARTERIOVENOUS (AV) ARTEGRAFT ARM Left 02/10/2024   Procedure: INSERTION, GRAFT, ARTERIOVENOUS, UPPER EXTREMITY USING GORE 4-7MM X 45CM ACUSEAL VASCULAR GRAFT;  Surgeon: Magda Debby SAILOR, MD;  Location: [facility];  Service: Vascular;  Laterality: Left;   IR FLUORO GUIDE CV LINE RIGHT  05/20/2021   IR US  GUIDE VASC ACCESS RIGHT  05/20/2021   LIGATION OF COMPETING BRANCHES OF ARTERIOVENOUS FISTULA Left 09/13/2021   Procedure: LIGATION AND ELEVATION OF COMPETING BRANCHES OF LEFT ARM ARTERIOVENOUS FISTULA;  Surgeon: Serene Gaile ORN, MD;  Location: [facility];  Service: Vascular;  Laterality: Left;   ORIF ANKLE FRACTURE Left 06/18/2023   Procedure: OPEN TREATMENT LATERAL MALLEOLUS;  Surgeon: Elsa Lonni SAUNDERS, MD;  Location: [facility];  Service: Orthopedics;  Laterality: Left;   SYNDESMOSIS REPAIR Left 06/18/2023   Procedure: SYNDESMOSIS REPAIR;  Surgeon: Elsa Lonni SAUNDERS, MD;  Location: [facility];  Service: Orthopedics;  Laterality: Left;      Physical Exam    Vitals:   03/03/24 0730 03/03/24 0731  BP: 138/78   Pulse: 77   Resp: 12   Temp:  97.6 F (36.4 C)  TempSrc:  Oral  SpO2: 96%   Weight:  115.7 kg  Height:  5' 11 (1.803 m)    Physical Exam   Procedures   If procedures were preformed on this patient, they are listed below:  Procedures  Please note that this documentation was produced with the assistance of voice-to-text technology and may contain errors.

## 2024-03-04 DIAGNOSIS — Z992 Dependence on renal dialysis: Secondary | ICD-10-CM | POA: Diagnosis not present

## 2024-03-04 DIAGNOSIS — E1129 Type 2 diabetes mellitus with other diabetic kidney complication: Secondary | ICD-10-CM | POA: Diagnosis not present

## 2024-03-04 DIAGNOSIS — N186 End stage renal disease: Secondary | ICD-10-CM | POA: Diagnosis not present

## 2024-03-05 DIAGNOSIS — Z992 Dependence on renal dialysis: Secondary | ICD-10-CM | POA: Diagnosis not present

## 2024-03-05 DIAGNOSIS — N2581 Secondary hyperparathyroidism of renal origin: Secondary | ICD-10-CM | POA: Diagnosis not present

## 2024-03-07 ENCOUNTER — Ambulatory Visit (HOSPITAL_COMMUNITY)

## 2024-03-07 ENCOUNTER — Ambulatory Visit (HOSPITAL_COMMUNITY): Attending: Surgery | Admitting: Surgery

## 2024-03-07 DIAGNOSIS — T8484XA Pain due to internal orthopedic prosthetic devices, implants and grafts, initial encounter: Secondary | ICD-10-CM | POA: Diagnosis not present

## 2024-03-07 DIAGNOSIS — S8262XG Displaced fracture of lateral malleolus of left fibula, subsequent encounter for closed fracture with delayed healing: Secondary | ICD-10-CM | POA: Diagnosis not present

## 2024-03-12 DIAGNOSIS — N186 End stage renal disease: Secondary | ICD-10-CM | POA: Diagnosis not present

## 2024-03-17 NOTE — Progress Notes (Unsigned)
 Cardiology Office Note:  .   Date:  03/18/2024  ID:  Matthew Odom, DOB June 30, 1970, MRN [id number] PCP: Jaycee Greig PARAS, NP  [facility] Providers Cardiologist:  Darryle ONEIDA Decent, MD    History of Present Illness: .    Chief Complaint  Patient presents with   Follow-up    Matthew Odom is a 53 y.o. male with history of ESRD, CHF, CAD, DM, HLD who presents for follow-up.   History of Present Illness   Matthew Odom is a 53 year old male with end-stage renal disease, HTN, DM, HLD and systolic heart failure who presents for follow-up. He is accompanied by his wife.  He has end-stage renal disease and has been on dialysis for approximately two years. During dialysis sessions, he experiences significant drops in blood pressure, leading to symptoms such as heart palpitations and feeling unwell, occasionally necessitating the cessation of treatment. He has not consulted a nephrologist in several years.  He has a history of systolic heart failure with an ejection fraction of 45-50% in 2023, which improved to 55-60% last year. He experiences palpitations when his blood pressure drops during dialysis.  He has hypertension, with home blood pressure readings often around 190/87 to 190/91 mmHg and even higher during dialysis sessions, exceeding 200 mmHg. He feels discomfort when his blood pressure drops to 160 mmHg. His current antihypertensive regimen includes carvedilol  25 mg twice daily, hydralazine  100 mg three times daily, amlodipine  10 mg daily, and clonidine . He did not take his medications on the day of the visit due to a busy morning.  He has diabetes and hyperlipidemia, with stable blood sugar levels between 160-200 mg/dL. He is on Lipitor 80 mg daily for hyperlipidemia management.  He has nonobstructive coronary artery disease with a catheterization in 2017 showing minimal CAD. He is concerned about potential blockages due to a friend's recent passing from blocked arteries.  He has a  history of cocaine use, with a positive test in July, but denies current use.          Problem List 1. HTN 2. Systolic HF -EF 45-50% 11/13/2021 -EF 55-60% 12/25/2022 -LHC 02/2016 [facility] -30% RCA, normal LAD/LCX 3. ESRD on HD 4. DM -A1c 7.0 -T chol 244, LDL 163, HDL 52, TG 157 5. Cocaine use  6. SVT  -11/2021 -converted on adenosine     ROS: All other ROS reviewed and negative. Pertinent positives noted in the HPI.     Studies Reviewed: SABRA       TTE 12/25/2022  1. Left ventricular ejection fraction, by estimation, is 55 to 60%. The  left ventricle has normal function. The left ventricle has no regional  wall motion abnormalities. There is severe concentric left ventricular  hypertrophy. Left ventricular diastolic   parameters are indeterminate.   2. Right ventricular systolic function is normal. The right ventricular  size is mildly enlarged. Tricuspid regurgitation signal is inadequate for  assessing PA pressure.   3. Left atrial size was mildly dilated.   4. The mitral valve is normal in structure. Trivial mitral valve  regurgitation. No evidence of mitral stenosis.   5. The aortic valve is normal in structure. Aortic valve regurgitation is  not visualized. No aortic stenosis is present.   6. The inferior vena cava is normal in size with greater than 50%  respiratory variability, suggesting right atrial pressure of 3 mmHg.   Physical Exam:   VS:  BP (!) 217/115   Pulse 95  Ht 5' 10 (1.778 m)   Wt 249 lb (112.9 kg)   SpO2 96%   BMI 35.73 kg/m    Wt Readings from Last 3 Encounters:  03/18/24 249 lb (112.9 kg)  03/03/24 255 lb (115.7 kg)  02/22/24 256 lb (116.1 kg)    GEN: Well nourished, well developed in no acute distress NECK: No JVD; No carotid bruits CARDIAC: RRR, no murmurs, rubs, gallops RESPIRATORY:  Clear to auscultation without rales, wheezing or rhonchi  ABDOMEN: Soft, non-tender, non-distended EXTREMITIES:  No edema; No deformity  ASSESSMENT AND  PLAN: .   Assessment and Plan    Difficult to control hypertension in the setting of end stage renal disease on hemodialysis Hypertension difficult to control due to end stage renal disease and hemodialysis. Blood pressure spikes to 217/115 mmHg, drops during dialysis causing hypotension. Inconsistent medication compliance and history of cocaine use may contribute. No nephrologist visit in years, seeking second opinion on dialysis management. - Check blood pressure twice daily, record readings. - Continue carvedilol  25 mg bid, clonidine  0.1 mg TID, hydralazine  100 mg TID, amlodipine  10 mg daily. - Refer to [facility] for potential renal denervation or newer treatments. - Refer to [facility] for re-evaluation and management of dialysis and hypertension. - Advised to refrain from cocaine use.  History of systolic heart failure with normalized left ventricular function Previous systolic dysfunction with EF 45-50% in 2022, now normalized to 55-60%. Symptoms of shortness of breath and palpitations may relate to blood pressure fluctuations and dialysis. - Order echocardiogram to assess current heart function. - Order stress test to evaluate for new blockages or cardiac issues.  Atherosclerotic heart disease of native coronary artery without angina Nonobstructive coronary artery disease with previous catheterization in 2017 showing minimal CAD. No current angina. - Order stress test to evaluate for new blockages or cardiac issues.  Type 2 diabetes mellitus Blood sugar levels well-controlled. A1c is 7.0%. - Continue current diabetes management.  Mixed hyperlipidemia Managed with Lipitor 80 mg daily. - Continue Lipitor 80 mg daily.  Shortness of breath Likely related to blood pressure fluctuations and dialysis. Symptoms include palpitations and heart racing when blood pressure drops. - Order echocardiogram to assess heart function. - Order stress test to  evaluate for cardiac issues.          Informed Consent   Shared Decision Making/Informed Consent The risks [chest pain, shortness of breath, cardiac arrhythmias, dizziness, blood pressure fluctuations, myocardial infarction, stroke/transient ischemic attack, nausea, vomiting, allergic reaction, radiation exposure, metallic taste sensation and life-threatening complications (estimated to be 1 in 10,000)], benefits (risk stratification, diagnosing coronary artery disease, treatment guidance) and alternatives of a nuclear stress test were discussed in detail with Matthew Odom and he agrees to proceed.      Follow-up: Return in about 6 months (around 09/15/2024).  Signed, Darryle DASEN. Barbaraann, MD, [facility]  [facility]  [address] [place], [place] [address] [phone]  3:54 PM

## 2024-03-18 ENCOUNTER — Ambulatory Visit: Attending: Cardiovascular Disease | Admitting: Cardiovascular Disease

## 2024-03-18 ENCOUNTER — Other Ambulatory Visit (HOSPITAL_COMMUNITY): Payer: Self-pay

## 2024-03-18 ENCOUNTER — Encounter: Payer: Self-pay | Admitting: Cardiovascular Disease

## 2024-03-18 VITALS — BP 217/115 | HR 95 | Ht 70.0 in | Wt 249.0 lb

## 2024-03-18 DIAGNOSIS — I10 Essential (primary) hypertension: Secondary | ICD-10-CM | POA: Diagnosis not present

## 2024-03-18 DIAGNOSIS — I5022 Chronic systolic (congestive) heart failure: Secondary | ICD-10-CM | POA: Diagnosis not present

## 2024-03-18 DIAGNOSIS — Z992 Dependence on renal dialysis: Secondary | ICD-10-CM

## 2024-03-18 DIAGNOSIS — I471 Supraventricular tachycardia, unspecified: Secondary | ICD-10-CM | POA: Diagnosis not present

## 2024-03-18 DIAGNOSIS — E782 Mixed hyperlipidemia: Secondary | ICD-10-CM | POA: Diagnosis not present

## 2024-03-18 DIAGNOSIS — I2510 Atherosclerotic heart disease of native coronary artery without angina pectoris: Secondary | ICD-10-CM | POA: Diagnosis not present

## 2024-03-18 DIAGNOSIS — R0602 Shortness of breath: Secondary | ICD-10-CM | POA: Diagnosis not present

## 2024-03-18 DIAGNOSIS — N186 End stage renal disease: Secondary | ICD-10-CM

## 2024-03-18 MED ORDER — BLOOD PRESSURE MONITOR AUTOMAT DEVI
1.0000 [IU] | Freq: Once | 0 refills | Status: AC
  Filled 2024-03-18: qty 1, 30d supply, fill #0

## 2024-03-18 NOTE — Patient Instructions (Addendum)
 Medication Instructions:  Your physician recommends that you continue on your current medications as directed. Please refer to the Current Medication list given to you today.  *If you need a refill on your cardiac medications before your next appointment, please call your pharmacy*  Testing/Procedures: Your physician has requested that you have an echocardiogram. Echocardiography is a painless test that uses sound waves to create images of your heart. It provides your doctor with information about the size and shape of your heart and how well your heart's chambers and valves are working. This procedure takes approximately one hour. There are no restrictions for this procedure. Please do NOT wear cologne, perfume, aftershave, or lotions (deodorant is allowed). Please arrive 15 minutes prior to your appointment time.  Please note: We ask at that you not bring children with you during ultrasound (echo/ vascular) testing. Due to room size and safety concerns, children are not allowed in the ultrasound rooms during exams. Our front office staff cannot provide observation of children in our lobby area while testing is being conducted. An adult accompanying a patient to their appointment will only be allowed in the ultrasound room at the discretion of the ultrasound technician under special circumstances. We apologize for any inconvenience.   Dr. Barbaraann has ordered a Lexiscan  Myocardial Perfusion Imaging Study.  Please arrive 15 minutes prior to your appointment time for registration and insurance purposes.   The test will take approximately 3 to 4 hours to complete; you may bring reading material.  If someone comes with you to your appointment, they will need to remain in the main lobby due to limited space in the testing area. **If you are pregnant or breastfeeding, please notify the nuclear lab prior to your appointment**   How to prepare for your Myocardial Perfusion Test: Do not eat or drink 3  hours prior to your test, except you may have water . Do not consume products containing caffeine  (regular or decaffeinated) 12 hours prior to your test. (ex: coffee, chocolate, sodas, tea). Do wear comfortable clothes (no dresses or overalls) and walking shoes, tennis shoes preferred (No heels or open toe shoes are allowed). Do NOT wear cologne, perfume, aftershave, or lotions (deodorant is allowed). If you use an inhaler, use it the AM of your test and bring it with you.  If you use a nebulizer, use it the AM of your test.  If these instructions are not followed, your test will have to be rescheduled.   Follow-Up: At [facility], you and your health needs are our priority.  As part of our continuing mission to provide you with exceptional heart care, our providers are all part of one team.  This team includes your primary Cardiologist (physician) and Advanced Practice Providers or APPs (Physician Assistants and Nurse Practitioners) who all work together to provide you with the care you need, when you need it.  Your next appointment:   6 month(s)  Provider:   Darryle ONEIDA Barbaraann, MD    We recommend signing up for the patient portal called MyChart.  Sign up information is provided on this After Visit Summary.  MyChart is used to connect with patients for Virtual Visits (Telemedicine).  Patients are able to view lab/test results, encounter notes, upcoming appointments, etc.  Non-urgent messages can be sent to your provider as well.   To learn more about what you can do with MyChart, go to [email or web address].   Other Instructions Your physician has requested that you regularly monitor and record your  blood pressure readings 2 times daily at home. Please use the same machine at the same time of day to check your readings and record them to bring to your follow-up visit.   HOW TO TAKE YOUR BLOOD PRESSURE: Rest 5 minutes before taking your blood pressure. Don't smoke or drink  caffeinated beverages for at least 30 minutes before. Take your blood pressure before (not after) you eat. Sit comfortably with your back supported and both feet on the floor (don't cross your legs). Elevate your arm to heart level on a table or a desk. Use the proper sized cuff. It should fit smoothly and snugly around your bare upper arm. There should be enough room to slip a fingertip under the cuff. The bottom edge of the cuff should be 1 inch above the crease of the elbow. Ideally, take 3 measurements at one sitting and record the average.

## 2024-03-19 DIAGNOSIS — N2581 Secondary hyperparathyroidism of renal origin: Secondary | ICD-10-CM | POA: Diagnosis not present

## 2024-03-19 DIAGNOSIS — Z992 Dependence on renal dialysis: Secondary | ICD-10-CM | POA: Diagnosis not present

## 2024-03-21 ENCOUNTER — Other Ambulatory Visit: Payer: Self-pay | Admitting: Cardiovascular Disease

## 2024-03-21 ENCOUNTER — Telehealth (HOSPITAL_COMMUNITY): Payer: Self-pay | Admitting: *Deleted

## 2024-03-21 DIAGNOSIS — R0602 Shortness of breath: Secondary | ICD-10-CM

## 2024-03-21 DIAGNOSIS — I2510 Atherosclerotic heart disease of native coronary artery without angina pectoris: Secondary | ICD-10-CM

## 2024-03-21 DIAGNOSIS — I5022 Chronic systolic (congestive) heart failure: Secondary | ICD-10-CM

## 2024-03-21 DIAGNOSIS — E782 Mixed hyperlipidemia: Secondary | ICD-10-CM

## 2024-03-21 NOTE — Telephone Encounter (Signed)
 Left message on voicemail per DPR in reference to upcoming appointment scheduled on 03/23/24  with detailed instructions given per Myocardial Perfusion Study Information Sheet for the test. LM to arrive 15 minutes early, and that it is imperative to arrive on time for appointment to keep from having the test rescheduled. If you need to cancel or reschedule your appointment, please call the office within 24 hours of your appointment. Failure to do so may result in a cancellation of your appointment, and a $50 no show fee. Phone number given for call back for any questions. Claudene Ronal Quale, RN

## 2024-03-23 ENCOUNTER — Ambulatory Visit (HOSPITAL_COMMUNITY)
Admission: RE | Admit: 2024-03-23 | Source: Ambulatory Visit | Attending: Cardiovascular Disease | Admitting: Cardiovascular Disease

## 2024-03-23 ENCOUNTER — Telehealth (HOSPITAL_COMMUNITY): Payer: Self-pay | Admitting: Cardiovascular Disease

## 2024-03-23 NOTE — Telephone Encounter (Signed)
 Patient NO SHOWED MYOVIEW  scheduled on 03/23/24. We will not reach out to patient to reschedule due to NO SHOW RATE of 24%. If patient calls back we will reschedule. Order will be removed from the [facility] WQ. Thank you.

## 2024-03-24 DIAGNOSIS — Z992 Dependence on renal dialysis: Secondary | ICD-10-CM | POA: Diagnosis not present

## 2024-03-24 DIAGNOSIS — N2581 Secondary hyperparathyroidism of renal origin: Secondary | ICD-10-CM | POA: Diagnosis not present

## 2024-03-26 DIAGNOSIS — Z992 Dependence on renal dialysis: Secondary | ICD-10-CM | POA: Diagnosis not present

## 2024-03-26 DIAGNOSIS — N186 End stage renal disease: Secondary | ICD-10-CM | POA: Diagnosis not present

## 2024-03-26 DIAGNOSIS — N2581 Secondary hyperparathyroidism of renal origin: Secondary | ICD-10-CM | POA: Diagnosis not present

## 2024-03-28 DIAGNOSIS — N186 End stage renal disease: Secondary | ICD-10-CM | POA: Diagnosis not present

## 2024-03-29 ENCOUNTER — Other Ambulatory Visit (HOSPITAL_COMMUNITY): Payer: Self-pay

## 2024-04-03 ENCOUNTER — Encounter (HOSPITAL_COMMUNITY): Payer: Self-pay

## 2024-04-03 ENCOUNTER — Emergency Department (HOSPITAL_COMMUNITY)

## 2024-04-03 ENCOUNTER — Other Ambulatory Visit: Payer: Self-pay

## 2024-04-03 ENCOUNTER — Emergency Department (HOSPITAL_COMMUNITY)
Admission: EM | Admit: 2024-04-03 | Discharge: 2024-04-03 | Disposition: A | Discharge status: Home / Self Care | Attending: Emergency Medicine | Admitting: Emergency Medicine

## 2024-04-03 DIAGNOSIS — I2510 Atherosclerotic heart disease of native coronary artery without angina pectoris: Secondary | ICD-10-CM | POA: Insufficient documentation

## 2024-04-03 DIAGNOSIS — Z992 Dependence on renal dialysis: Secondary | ICD-10-CM | POA: Diagnosis not present

## 2024-04-03 DIAGNOSIS — N186 End stage renal disease: Secondary | ICD-10-CM | POA: Diagnosis not present

## 2024-04-03 DIAGNOSIS — E1122 Type 2 diabetes mellitus with diabetic chronic kidney disease: Secondary | ICD-10-CM | POA: Insufficient documentation

## 2024-04-03 DIAGNOSIS — Z7951 Long term (current) use of inhaled steroids: Secondary | ICD-10-CM | POA: Insufficient documentation

## 2024-04-03 DIAGNOSIS — I120 Hypertensive chronic kidney disease with stage 5 chronic kidney disease or end stage renal disease: Secondary | ICD-10-CM | POA: Insufficient documentation

## 2024-04-03 DIAGNOSIS — Z7984 Long term (current) use of oral hypoglycemic drugs: Secondary | ICD-10-CM | POA: Insufficient documentation

## 2024-04-03 DIAGNOSIS — Z794 Long term (current) use of insulin: Secondary | ICD-10-CM | POA: Insufficient documentation

## 2024-04-03 DIAGNOSIS — J449 Chronic obstructive pulmonary disease, unspecified: Secondary | ICD-10-CM | POA: Insufficient documentation

## 2024-04-03 DIAGNOSIS — Z79899 Other long term (current) drug therapy: Secondary | ICD-10-CM | POA: Insufficient documentation

## 2024-04-03 DIAGNOSIS — E1129 Type 2 diabetes mellitus with other diabetic kidney complication: Secondary | ICD-10-CM | POA: Diagnosis not present

## 2024-04-03 DIAGNOSIS — Z7982 Long term (current) use of aspirin: Secondary | ICD-10-CM | POA: Diagnosis not present

## 2024-04-03 DIAGNOSIS — R0602 Shortness of breath: Secondary | ICD-10-CM | POA: Diagnosis present

## 2024-04-03 LAB — CBC WITH DIFFERENTIAL/PLATELET
Abs Immature Granulocytes: 0.04 K/uL (ref 0.00–0.07)
Basophils Absolute: 0.1 K/uL (ref 0.0–0.1)
Basophils Relative: 0 %
Eosinophils Absolute: 0.1 K/uL (ref 0.0–0.5)
Eosinophils Relative: 1 %
HCT: 29.2 % — ABNORMAL LOW (ref 39.0–52.0)
Hemoglobin: 9.4 g/dL — ABNORMAL LOW (ref 13.0–17.0)
Immature Granulocytes: 0 %
Lymphocytes Relative: 21 %
Lymphs Abs: 2.4 K/uL (ref 0.7–4.0)
MCH: 28.1 pg (ref 26.0–34.0)
MCHC: 32.2 g/dL (ref 30.0–36.0)
MCV: 87.4 fL (ref 80.0–100.0)
Monocytes Absolute: 1 K/uL (ref 0.1–1.0)
Monocytes Relative: 9 %
Neutro Abs: 7.7 K/uL (ref 1.7–7.7)
Neutrophils Relative %: 69 %
Platelets: 239 K/uL (ref 150–400)
RBC: 3.34 MIL/uL — ABNORMAL LOW (ref 4.22–5.81)
RDW: 14.7 % (ref 11.5–15.5)
WBC: 11.3 K/uL — ABNORMAL HIGH (ref 4.0–10.5)
nRBC: 0 % (ref 0.0–0.2)

## 2024-04-03 LAB — COMPREHENSIVE METABOLIC PANEL WITH GFR
ALT: 8 U/L (ref 0–44)
AST: <10 U/L — ABNORMAL LOW (ref 15–41)
Albumin: 3.5 g/dL (ref 3.5–5.0)
Alkaline Phosphatase: 46 U/L (ref 38–126)
Anion gap: 23 — ABNORMAL HIGH (ref 5–15)
BUN: 123 mg/dL — ABNORMAL HIGH (ref 6–20)
CO2: 15 mmol/L — ABNORMAL LOW (ref 22–32)
Calcium: 8.6 mg/dL — ABNORMAL LOW (ref 8.9–10.3)
Chloride: 98 mmol/L (ref 98–111)
Creatinine, Ser: 16.18 mg/dL — ABNORMAL HIGH (ref 0.61–1.24)
GFR, Estimated: 3 mL/min — ABNORMAL LOW (ref >60)
Glucose, Bld: 107 mg/dL — ABNORMAL HIGH (ref 70–99)
Potassium: 4.1 mmol/L (ref 3.5–5.1)
Sodium: 136 mmol/L (ref 135–145)
Total Bilirubin: 0.7 mg/dL (ref 0.0–1.2)
Total Protein: 7.3 g/dL (ref 6.5–8.1)

## 2024-04-03 LAB — HEPATITIS B SURFACE ANTIGEN: Hepatitis B Surface Ag: NONREACTIVE

## 2024-04-03 MED ORDER — LIDOCAINE-PRILOCAINE 2.5-2.5 % EX CREA: 1.0000 | TOPICAL_CREAM | CUTANEOUS | Status: DC | PRN

## 2024-04-03 MED ORDER — PENTAFLUOROPROP-TETRAFLUOROETH EX AERO: 1.0000 | INHALATION_SPRAY | CUTANEOUS | Status: DC | PRN

## 2024-04-03 MED ORDER — HEPARIN SODIUM (PORCINE) 1000 UNIT/ML DIALYSIS: 40.0000 [IU]/kg | INTRAMUSCULAR | Status: DC | PRN

## 2024-04-03 MED ORDER — CHLORHEXIDINE GLUCONATE CLOTH 2 % EX PADS: 6.0000 | MEDICATED_PAD | Freq: Every day | CUTANEOUS | Status: DC

## 2024-04-03 MED ORDER — LIDOCAINE HCL (PF) 1 % IJ SOLN: 5.0000 mL | INTRAMUSCULAR | Status: DC | PRN

## 2024-04-03 NOTE — ED Provider Notes (Signed)
 [place] EMERGENCY DEPARTMENT AT [facility] Provider Note   CSN: [id number] Arrival date & time: 04/03/24  9649     Patient presents with: missed dialysis    Matthew Odom is a 53 y.o. male.    53 year old male with a history of hypertension, diabetes, CAD, COPD, ESRD on hemodialysis, and cocaine abuse presents to the emergency department for evaluation of shortness of breath.  He has been increasingly short of breath with exertion as well as when lying supine.  He has been unable to get sleep over the past few days as he continually wakes feeling short of breath.  He was last dialyzed on Monday.  Santina out of town afterwards.  Thought that he would make it back to his dialysis center on Saturday, but was unable to do so because of the traffic.  He is not having any chest pain.  No associated fevers or vomiting.  Typically dialyzes at [facility] clinic.       Prior to Admission medications   Medication Sig Start Date End Date Taking? Authorizing Provider  acetaminophen  (TYLENOL ) 500 MG tablet Take 500-1,000 mg by mouth every 6 (six) hours as needed (pain.).    [provider]  albuterol  (PROVENTIL ) (2.5 MG/3ML) 0.083% nebulizer solution Take 3 mLs (2.5 mg total) by nebulization every 6 (six) hours as needed for wheezing or shortness of breath. 02/02/24   Adrien Winfred Berke, MD  albuterol  (VENTOLIN  HFA) [phone] Base) MCG/ACT inhaler Inhale 2 puffs into the lungs every 6 (six) hours as needed for wheezing or shortness of breath. 02/02/24   Adrien Winfred Berke, MD  amLODipine  (NORVASC ) 10 MG tablet Take 1 tablet (10 mg total) by mouth daily. 11/13/23   Jaycee Greig PARAS, NP  aspirin  EC 81 MG tablet Take one tablet by mouth twice daily for 30 days after sugery for blood clot prevention. Start post op day 1. Swallow whole. 11/13/23   Jaycee Greig PARAS, NP  atorvastatin  (LIPITOR) 80 MG tablet Take 1 tablet (80 mg total) by mouth daily. 11/13/23 03/18/24  Jaycee Greig PARAS, NP   budesonide -formoterol  (SYMBICORT ) 160-4.5 MCG/ACT inhaler Inhale 2 puffs into the lungs 2 (two) times daily. 02/02/24   Adrien Winfred Berke, MD  calcium  acetate (PHOSLO ) 667 MG capsule Take 2 capsules (1,334 mg total) by mouth 3 (three) times daily with meals 06/25/21     calcium  carbonate (TUMS) 500 MG chewable tablet Chew 1 tablet (200 mg of elemental calcium  total) by mouth 3 (three) times daily with meals. 11/13/23   Jaycee Greig PARAS, NP  carvedilol  (COREG ) 25 MG tablet Take 1 tablet (25 mg total) by mouth 2 (two) times daily with a meal. 11/13/23   Jaycee Greig PARAS, NP  cloNIDine  (CATAPRES ) 0.1 MG tablet Take 1 tablet (0.1 mg total) by mouth 3 (three) times daily. 02/09/24   Singh, Prashant K, MD  doxazosin  (CARDURA ) 4 MG tablet Take 3 tablets (12 mg total) by mouth at bedtime. 11/13/23   Jaycee Greig PARAS, NP  doxazosin  (CARDURA ) 8 MG tablet Take 1 tablet (8 mg total) by mouth daily. 12/05/22   O'NealDarryle Ned, MD  hydrALAZINE  (APRESOLINE ) 100 MG tablet Take 1 tablet (100 mg total) by mouth 3 (three) times daily. 11/13/23   Jaycee Greig PARAS, NP  Insulin  Glargine (BASAGLAR  KWIKPEN) 100 UNIT/ML Inject 20 Units into the skin daily. 11/13/23   Jaycee Greig PARAS, NP  insulin  lispro (HUMALOG ) 100 UNIT/ML injection Inject 0-0.04 mLs (0-4 Units total) into the skin  3 (three) times daily as needed for high blood sugar. 11/13/23   Jaycee Greig PARAS, NP  Insulin  Pen Needle 31G X 6 MM MISC Use as directed in the morning, at noon, in the evening, and at bedtime. 11/13/23   Jaycee Greig PARAS, NP  liraglutide  (VICTOZA ) 18 MG/3ML SOPN Inject 1.8 mg into the skin daily. 11/13/23   Jaycee Greig PARAS, NP  nitroGLYCERIN  (NITROSTAT ) 0.4 MG SL tablet Place 1 tablet (0.4 mg total) under the tongue every 5 (five) minutes as needed for chest pain. 11/13/23   Jaycee Greig PARAS, NP  rOPINIRole  (REQUIP ) 0.25 MG tablet Take 1-2 tablets (0.25-0.5 mg total) by mouth at bedtime as needed for restless leg. 02/23/24     sertraline  (ZOLOFT ) 25 MG tablet Take 1  tablet (25 mg total) by mouth daily. 11/13/23   Jaycee Greig PARAS, NP  SPIRIVA  HANDIHALER 18 MCG CAPS Place into inhaler and inhale daily at 6 (six) AM. 02/10/24   [provider]  tiotropium (SPIRIVA ) 18 MCG inhalation capsule Place 1 capsule (18 mcg total) into inhaler and inhale daily. 02/02/24   Adrien Winfred Berke, MD    Allergies: Aspirin  and Nitroglycerin     Review of Systems Ten systems reviewed and are negative for acute change, except as noted in the HPI.    Updated Vital Signs BP (!) 205/113   Pulse 87   Temp 98.1 F (36.7 C) (Oral)   Resp (!) 29   Ht 5' 11 (1.803 m)   Wt 113.4 kg   SpO2 100%   BMI 34.87 kg/m   Physical Exam Vitals and nursing note reviewed.  Constitutional:      General: He is not in acute distress.    Appearance: He is well-developed. He is not diaphoretic.     Comments: Obese African-American male.  Somnolent when not stimulated.  HENT:     Head: Normocephalic and atraumatic.  Eyes:     General: No scleral icterus.    Conjunctiva/sclera: Conjunctivae normal.  Cardiovascular:     Rate and Rhythm: Normal rate and regular rhythm.     Pulses: Normal pulses.     Comments: Palpable thrill left forearm Pulmonary:     Effort: Pulmonary effort is normal. No respiratory distress.     Comments: Mild rhonchi bilateral bases.  No hypoxia on room air.  Mild tachypnea noted without overt dyspnea. Musculoskeletal:        General: Normal range of motion.     Cervical back: Normal range of motion.  Skin:    General: Skin is warm and dry.     Coloration: Skin is not pale.     Findings: No erythema or rash.  Neurological:     Mental Status: He is alert and oriented to person, place, and time.     Coordination: Coordination normal.  Psychiatric:        Behavior: Behavior normal.     (all labs ordered are listed, but only abnormal results are displayed) Labs Reviewed  CBC WITH DIFFERENTIAL/PLATELET - Abnormal; Notable for the following  components:      Result Value   WBC 11.3 (*)    RBC 3.34 (*)    Hemoglobin 9.4 (*)    HCT 29.2 (*)    All other components within normal limits  COMPREHENSIVE METABOLIC PANEL WITH GFR - Abnormal; Notable for the following components:   CO2 15 (*)    Glucose, Bld 107 (*)    BUN 123 (*)    Creatinine, Ser  16.18 (*)    Calcium  8.6 (*)    AST <10 (*)    GFR, Estimated 3 (*)    Anion gap 23 (*)    All other components within normal limits    EKG: None  Radiology: DG Chest 2 View Result Date: 04/03/2024 EXAM: 2 VIEW(S) XRAY OF THE CHEST 04/03/2024 04:17:00 AM COMPARISON: 03/03/2024 CLINICAL HISTORY: missed dialysis FINDINGS: LUNGS AND PLEURA: No focal pulmonary opacity. No pleural effusion. No pneumothorax. HEART AND MEDIASTINUM: Stable cardiomegaly. No acute abnormality of the mediastinal silhouette. BONES AND SOFT TISSUES: No acute osseous abnormality. IMPRESSION: 1. Stable cardiomegaly. Electronically signed by: Evalene Coho MD 04/03/2024 04:35 AM EST RP Workstation: [id number]     Procedures   Medications Ordered in the ED - No data to display  Clinical Course as of 04/03/24 0559  Sun Apr 03, 2024  0558 Spoke with Dr. Tobie who will coordinate dialysis for this morning.  Patient then to be brought back to the ED for reassessment, possible discharge. [KH]    Clinical Course User Index [KH] Keith Sor, PA-C                                 Medical Decision Making  This patient presents to the ED for concern of SOB, this involves an extensive number of treatment options, and is a complaint that carries with it a high risk of complications and morbidity.  The differential diagnosis includes COPD exacerbation vs CHF exacerbation vs ACS vs PNA vs pleural effusion vs viral illness   Co morbidities that complicate the patient evaluation  ESRD HTN DM CAD   Additional history obtained:  External records from outside source obtained and reviewed including prior  discharge summaries   Lab Tests:  I Ordered, and personally interpreted labs.  The pertinent results include:  CO2 15, Anion gap 23, WBC 11.3   Imaging Studies ordered:  I ordered imaging studies including CXR  I independently visualized and interpreted imaging which showed cardiomegaly I agree with the radiologist interpretation   Cardiac Monitoring:  The patient was maintained on a cardiac monitor.  I personally viewed and interpreted the cardiac monitored which showed an underlying rhythm of: NSR   Medicines ordered and prescription drug management:  I have reviewed the patients home medicines and have made adjustments as needed   Test Considered:  BNP   Consultations Obtained:  I requested consultation with Dr. Tobie of nephrology and discussed lab and imaging findings as well as pertinent plan - they recommend: Urgent dialysis.  Will attempt to coordinate for this morning.   Problem List / ED Course:  Patient with metabolic acidosis, complaints of shortness of breath with orthopnea.  Was last dialyzed on Monday, 03/28/2024. Not requiring supplemental oxygen.  Quite hypertensive, likely also due to need for dialysis.  Electrolytes reassuring. Spoke with Dr. Tobie of nephrology who will work on coordinating dialysis for the patient this morning.  If symptoms improved after dialysis, anticipate discharge home with instruction to adhere to scheduled dialysis times.   Reevaluation:  After the interventions noted above, I reevaluated the patient and found that they have :stayed the same   Dispostion:  Care signed out to [place], [place] at shift change.      Final diagnoses:  ESRD needing dialysis [facility])    ED Discharge Orders     None          Keith Sor, PA-C 04/03/24  9395    Palumbo, April, MD 04/03/24 [phone]

## 2024-04-03 NOTE — ED Triage Notes (Signed)
 Pt arrived POV from home c/o missing dialysis. Pt does endorses some SHOB. Pt normally goes on T,TH,S but due to the holiday he went Monday and then went out of town and has not been dialyzed since.

## 2024-04-03 NOTE — Progress Notes (Signed)
 Patient ID: Matthew Odom, male   DOB: 1970-08-23, 53 y.o.   MRN: [id number] Consulted this morning by Darice Showers, PA from the emergency room to provide urgent hemodialysis for Mr. Matthew Odom who is a TTS hemodialysis patient that missed dialysis yesterday because of misunderstanding about his schedule.  He presented with worsening shortness of breath and physical exam consistent with volume overload/pulmonary edema.  Labs show azotemia with mild metabolic acidosis but without significant hyperkalemia. Urgent ED dialysis will be placed.  I have alerted the dialysis nurse on-call about the patient. Per my discussion with Darice Showers, PA, he will be discharged back to the emergency room upon completion of dialysis for triage and possible discharge home.  Matthew Blanch MD [facility]. Office # [phone] Pager # [phone] 7:35 AM

## 2024-04-03 NOTE — ED Provider Notes (Signed)
 1:59 PM patient seen, post dialysis today.  I reviewed labs and ED chart from earlier today.  Patient presented with fluid overload.  He missed dialysis, past 2 sessions.  Was trying to make it back today, but got stuck in traffic.  Potassium was normal.  BUN was 123.  Patient reports much improvement in breathing after dialysis.  He has no concerns about going home.  States that he feels back to baseline.  No concerns about being able to make dialysis on Tuesday.  Plan for discharge.  BP (!) 160/99 (BP Location: Right Arm)   Pulse 74   Temp 98.4 F (36.9 C) (Oral)   Resp 16   Ht 5' 11 (1.803 m)   Wt 111 kg   SpO2 100%   BMI 34.13 kg/m     Desiderio Chew, PA-C 04/03/24 1400    Francesca Elsie CROME, MD 04/03/24 [phone]

## 2024-04-03 NOTE — Discharge Instructions (Signed)
 Please follow-up with your dialysis center on Tuesday as planned for routine dialysis.  Return with any worsening shortness of breath or other concerns.

## 2024-04-03 NOTE — Procedures (Signed)
 I was present at this dialysis session. I have reviewed the session itself and made appropriate changes.  Pt to go back to ED to be evaluated.  Vital signs in last 24 hours:  Temp:  [98.1 F (36.7 C)-98.4 F (36.9 C)] 98.4 F (36.9 C) (11/30 0915) Pulse Rate:  [75-88] 75 (11/30 1000) Resp:  [12-33] 17 (11/30 1000) BP: (172-224)/(90-118) 173/100 (11/30 1000) SpO2:  [97 %-100 %] 100 % (11/30 1000) Weight:  [113.4 kg] 113.4 kg (11/30 0914) Weight change:  Filed Weights   04/03/24 0355 04/03/24 0914  Weight: 113.4 kg 113.4 kg    Recent Labs  Lab 04/03/24 0405  NA 136  K 4.1  CL 98  CO2 15*  GLUCOSE 107*  BUN 123*  CREATININE 16.18*  CALCIUM  8.6*    Recent Labs  Lab 04/03/24 0405  WBC 11.3*  NEUTROABS 7.7  HGB 9.4*  HCT 29.2*  MCV 87.4  PLT 239    Scheduled Meds:  Chlorhexidine  Gluconate Cloth  6 each Topical Q0600   Continuous Infusions: PRN Meds:.heparin , lidocaine  (PF), lidocaine -prilocaine , pentafluoroprop-tetrafluoroeth   Fairy DELENA Sellar,  MD 04/03/2024, 11:13 AM

## 2024-04-03 NOTE — Progress Notes (Signed)
 The patient completed the HD session with UF 2.6 liters. During the procedure, the patient experienced 3 episodes of muscle cramping. The HD access, however, was not optimal, with BFR 250 to 300 ml/min. The system clotted once. All theses events have been communicated to the Dr. Tobie.  04/03/24 1310  Vitals  Temp 98.4 F (36.9 C)  Temp Source Oral  BP (!) 160/99  BP Location Right Arm  BP Method Automatic  Resp 16  Weight 111 kg  Type of Weight Post-Dialysis  Oxygen Therapy  O2 Device Room Air  During Treatment Monitoring  Intra-Hemodialysis Comments See progress note  Post Treatment  Dialyzer Clearance Clotted  Hemodialysis Intake (mL) 0 mL  Liters Processed 50  Fluid Removed (mL) 2600 mL  Tolerated HD Treatment Yes  Post-Hemodialysis Comments see notes  AVG/AVF Arterial Site Held (minutes) 7 minutes  AVG/AVF Venous Site Held (minutes) 7 minutes

## 2024-04-05 ENCOUNTER — Other Ambulatory Visit: Payer: Self-pay

## 2024-04-05 LAB — HEPATITIS B SURFACE ANTIBODY, QUANTITATIVE: Hep B S AB Quant (Post): 45 m[IU]/mL

## 2024-04-05 MED ORDER — CLONIDINE HCL 0.1 MG PO TABS
ORAL_TABLET | ORAL | 3 refills | Status: AC
  Filled 2024-04-05: qty 270, 90d supply, fill #0

## 2024-04-06 ENCOUNTER — Other Ambulatory Visit: Payer: Self-pay

## 2024-04-14 NOTE — Progress Notes (Signed)
 Matthew Odom                                          MRN: [id number]   04/14/2024   The [facility] Quality Team Specialist reviewed this patient medical record for the purposes of chart review for care gap closure. The following were reviewed: chart review for care gap closure-colorectal cancer screening, diabetic eye exam, and glycemic status assessment.    [facility] Quality Team

## 2024-04-19 ENCOUNTER — Other Ambulatory Visit: Payer: Self-pay

## 2024-04-19 MED ORDER — DOXAZOSIN MESYLATE 8 MG PO TABS
8.0000 mg | ORAL_TABLET | Freq: Every day | ORAL | 3 refills | Status: AC
  Filled 2024-04-19: qty 90, 90d supply, fill #0

## 2024-04-19 NOTE — Progress Notes (Incomplete)
 Matthew Odom    [id number]    [birth year]  Primary Care Physician:Massey, Greig PARAS, NP Date of Appointment: 04/19/2024 Established Patient Visit  Chief complaint:   No chief complaint on file.  HPI: Matthew Odom is a 53 y.o. man with OSA no compliance with CPAP, ESRD, HFrEF and history of asthma.  Last time patient follow-up with pulmonary was in 2023 and he was prescribed Symbicort .  He is compliant with the medication.  He reports he has worsening of shortness of breath over the last 6 months.    He is compliance with his dialysis session 3 times a week.  Fluid removal no more than 5L.  He has steady weight over the last months between 240 and 244 lb.  He denied any lower extremity edema, however he has orthopnea, and nocturnal dyspnea.  His last echo in 2024 was overall normal.  He does not use CPAP machine because it is uncomfortable.  Patient has history of 40 years of smoking a pack a day and recently has decreased to half a pack a day.  He had nicotine  patches that helped him with the cravings.  Interval Updates -Exercise like walking from the parking lot to the office, strong laughing make him feel short of breath.  His wife reports wheezing during the day but more frequent at night. - He has baseline cough and denied sputum production. -He uses nebulizer 1-2 times a week.  He does not use the albuterol  inhaler. -He had in the last 2 years 2 exacerbations and the the last one was a month ago. Prednisone  was prescribed. -He wants to quit smoking and is requesting nicotine  replacement therapies. He agreed to smoking cessation counseling referral. -He has daytime sleepiness, and he was falling asleep in the encounter. His bicarb has been normal. -He is not interested on Lung cancer screening at this time but he will think about it and let me know at the next appt.  I have reviewed the patient's family social and past medical history and updated as appropriate.   Past  Medical History:  Diagnosis Date   Anxiety    BPH (benign prostatic hyperplasia)    Cocaine use    07/15/21- last time was more than 10 years ago.   Complication of anesthesia    wakes up during surgery   COPD (chronic obstructive pulmonary disease) (HCC)    Coronary artery disease 2021   mild non obstructed   Diabetes mellitus without complication (HCC)    Enlarged heart    ESRD (end stage renal disease) (HCC)    TTHSAT   History of degenerative disc disease    Hyperlipidemia    Hypertension    NSTEMI (non-ST elevated myocardial infarction) (HCC)    Pneumonia    Sciatic nerve pain    Sleep apnea    Stroke (HCC)    no residual, x 3 last one was in 2020   Thoracic ascending aortic aneurysm    4.2 cm 08/03/21 CTA chest    Past Surgical History:  Procedure Laterality Date   A/V SHUNT INTERVENTION Left 11/23/2023   Procedure: A/V SHUNT INTERVENTION;  Surgeon: Magda Debby SAILOR, MD;  Location: [facility];  Service: Cardiovascular;  Laterality: Left;   A/V SHUNT INTERVENTION Left 12/25/2023   Procedure: A/V SHUNT INTERVENTION;  Surgeon: Gretta Lonni PARAS, MD;  Location: [facility];  Service: Cardiovascular;  Laterality: Left;   ANKLE ARTHROSCOPY Left 06/18/2023  Procedure: LEFT ANKLE ARTHROSCOPIC DEBRIDEMENT;  Surgeon: Elsa Lonni SAUNDERS, MD;  Location: [facility];  Service: Orthopedics;  Laterality: Left;   AV FISTULA PLACEMENT Left 07/17/2021   Procedure: CREATION  OF LEFT ARM RADIOCEPHALIC ARTERIOVENOUS (AV) FISTULA;  Surgeon: Serene Gaile ORN, MD;  Location: [facility];  Service: Vascular;  Laterality: Left;   CARDIAC CATHETERIZATION     INSERTION OF ARTERIOVENOUS (AV) ARTEGRAFT ARM Left 02/10/2024   Procedure: INSERTION, GRAFT, ARTERIOVENOUS, UPPER EXTREMITY USING GORE 4-7MM X 45CM ACUSEAL VASCULAR GRAFT;  Surgeon: Magda Debby SAILOR, MD;  Location: [facility];  Service: Vascular;  Laterality: Left;   IR FLUORO GUIDE CV LINE RIGHT  05/20/2021   IR US  GUIDE VASC ACCESS RIGHT  05/20/2021    LIGATION OF COMPETING BRANCHES OF ARTERIOVENOUS FISTULA Left 09/13/2021   Procedure: LIGATION AND ELEVATION OF COMPETING BRANCHES OF LEFT ARM ARTERIOVENOUS FISTULA;  Surgeon: Serene Gaile ORN, MD;  Location: [facility];  Service: Vascular;  Laterality: Left;   ORIF ANKLE FRACTURE Left 06/18/2023   Procedure: OPEN TREATMENT LATERAL MALLEOLUS;  Surgeon: Elsa Lonni SAUNDERS, MD;  Location: [facility];  Service: Orthopedics;  Laterality: Left;   SYNDESMOSIS REPAIR Left 06/18/2023   Procedure: SYNDESMOSIS REPAIR;  Surgeon: Elsa Lonni SAUNDERS, MD;  Location: [facility];  Service: Orthopedics;  Laterality: Left;    Family History  Problem Relation Age of Onset   Heart attack Mother    Hypertension Mother    Diabetes Mother    Heart disease Sister    Hypertension Sister    Hypertension Father    Emphysema Father     Social History   Occupational History   Occupation: disabled  Tobacco Use   Smoking status: Every Day    Current packs/day: 0.50    Average packs/day: 0.5 packs/day for 40.0 years (20.0 ttl pk-yrs)    Types: Cigarettes   Smokeless tobacco: Never   Tobacco comments:    Has smoked for 40 years, 1/2 ppd   Vaping Use   Vaping status: Never Used  Substance and Sexual Activity   Alcohol use: Not Currently   Drug use: Not Currently    Comment: years ago per patient   Sexual activity: Yes     Physical Exam: There were no vitals taken for this visit.  Gen:      No acute distress. Falling asleep in time to time but able to answer questions. Lungs:    No increased respiratory effort, symmetric chest wall excursion, clear to auscultation bilaterally, no wheezes or crackles CV:         Regular rate and rhythm; no murmurs, rubs, or gallops.  No pedal edema Extremities: No LE edema Neuro: Alert and oriented able to provide history, sometimes he falls asleep.    Data Reviewed: Imaging: I have personally reviewed the CTPE study April 2023 no acute PE = stable 3-4 mm pumonary nodules  unchanged from 2020.   CXR 11/2023 Normal   Echo 12/2022 1. Left ventricular ejection fraction, by estimation, is 55 to 60%. The  left ventricle has normal function. The left ventricle has no regional  wall motion abnormalities. There is severe concentric left ventricular  hypertrophy. Left ventricular diastolic   parameters are indeterminate.   2. Right ventricular systolic function is normal. The right ventricular  size is mildly enlarged. Tricuspid regurgitation signal is inadequate for  assessing PA pressure.   3. Left atrial size was mildly dilated.   4. The mitral valve is normal in structure. Trivial mitral valve  regurgitation. No evidence of mitral stenosis.   5. The aortic valve is normal in structure. Aortic valve regurgitation is  not visualized. No aortic stenosis is present.   6. The inferior vena cava is normal in size with greater than 50%  respiratory variability, suggesting right atrial pressure of 3 mmHg.   PFTs     Latest Ref Rng & Units 09/04/2021    9:39 AM  PFT Results  FVC-Pre L 2.91   FVC-Predicted Pre % 69   FVC-Post L 3.36   FVC-Predicted Post % 80   Pre FEV1/FVC % % 80   Post FEV1/FCV % % 78   FEV1-Pre L 2.34   FEV1-Predicted Pre % 69   FEV1-Post L 2.63   DLCO uncorrected ml/min/mmHg 20.47   DLCO UNC% % 69   DLCO corrected ml/min/mmHg 23.76   DLCO COR %Predicted % 80   DLVA Predicted % 100    I have personally reviewed the patient's PFTs and show no airflow limitation. He does have a significant BD response. His diffusion capacity was mildly reduced.   08/03/2021 1. No evidence of pulmonary embolism or other acute process. 2. Scattered 3-4 mm pulmonary nodules in the right upper and middle lobes, unchanged from 2020. No follow-up needed if patient is low-risk (and has no known or suspected primary neoplasm). Non-contrast chest CT can be considered in 12 months if patient is high-risk. This recommendation follows the consensus  statement: Guidelines for Management of Incidental Pulmonary Nodules Detected on CT Images: From the Fleischner Society 2017; Radiology 2017; 284:228-243. 3. Ascending aortic aneurysm measuring 4.2 cm. Recommend annual imaging followup by CTA or MRA. This recommendation follows 2010 ACCF/AHA/AATS/ACR/ASA/SCA/SCAI/SIR/STS/SVM Guidelines for the Diagnosis and Management of Patients with Thoracic Aortic Disease.  Labs:  Immunization status: Immunization History  Administered Date(s) Administered   PFIZER(Purple Top)SARS-COV-2 Vaccination 12/24/2019, 01/14/2020   Pneumococcal Polysaccharide-23 02/13/2017   Tdap 01/14/2023    Bicarb normal Eos 0.2  Assessment:  Dyspnea on exertion His dyspnea is multifactorial likely due to Asthma, ESRD on hemodialysis and CHF. Patient is compliance with dialysis, his weight is in the normal range.  He has orthopnea and nocturnal dyspnea, however his last echocardiogram was overall unremarkable.  He reports to have worsening shortness of breath over the last 6 months.  He has wheezing, and he is using his albuterol  more frequent, 2 times a day.  I believe his dyspnea is related to uncontrolled asthma.  - Maximize asthma therapy: Symbicort  + Spiriva , albuterol  as need it.  Other options could be Trelegy 200. - Use nebulizer albuterol  twice a day until symptoms improve.  2.  Current smoker Patient has history of 40 years of smoking a pack a day and recently has decreased to half a pack a day.  I have an extensive conversation about smoking counseling.  He will decrease the number of cigarettes a day and use nicotine  patches for cravings.   His last CT scan did not show concerning lung nodules.  I offered him to be enrolled to the lung cancer screening program, however he declined at this time.  He will let me know if he changes mind in the next appointment. - Prescribed nicotine  therapy. -I performed smoking counseling and refer to smoking cessation  program -Patient declined lung cancer screening  3. OSA - non compliance with Cpap.  Patient instructions: Dear Mr. Denio:  You have shortness of breath and wheezing, and I will maximize your asthma treatment with 3 therapies: -Continue the symbicort  twice  a day. -I start a new inhaler, spiriva  1puff a day. -Nebulizer albuterol , you can use twice a day for a few weeks until your symptoms settle down.  For your smoking cessation, I am happy you would like to quit! Congratulations! I will start with nicotine  patch and gum to help you with the cravings. I sent a referral for the smoking clinic cessation to continue to follow up with you closely.  I recommend to do a lung cancer screening, however you declined at this time. Let me know if you change your mind at the next visit.  I will see you in 3 months.  No follow-ups on file.   Marny Patch, MD Pulmonary and Critical Care Medicine [facility]

## 2024-04-20 ENCOUNTER — Ambulatory Visit

## 2024-04-20 DIAGNOSIS — J449 Chronic obstructive pulmonary disease, unspecified: Secondary | ICD-10-CM

## 2024-04-20 DIAGNOSIS — J4540 Moderate persistent asthma, uncomplicated: Secondary | ICD-10-CM

## 2024-04-28 ENCOUNTER — Emergency Department (HOSPITAL_COMMUNITY)

## 2024-04-28 ENCOUNTER — Encounter (HOSPITAL_COMMUNITY): Admission: EM | Discharge status: Against Medical Advice | Payer: Self-pay | Source: Home / Self Care

## 2024-04-28 ENCOUNTER — Other Ambulatory Visit: Payer: Self-pay

## 2024-04-28 ENCOUNTER — Emergency Department (HOSPITAL_COMMUNITY): Admission: EM | Admit: 2024-04-28 | Discharge: 2024-04-28 | Discharge status: Against Medical Advice

## 2024-04-28 DIAGNOSIS — I120 Hypertensive chronic kidney disease with stage 5 chronic kidney disease or end stage renal disease: Secondary | ICD-10-CM | POA: Insufficient documentation

## 2024-04-28 DIAGNOSIS — R0602 Shortness of breath: Secondary | ICD-10-CM | POA: Diagnosis present

## 2024-04-28 DIAGNOSIS — I712 Thoracic aortic aneurysm, without rupture, unspecified: Secondary | ICD-10-CM | POA: Diagnosis not present

## 2024-04-28 DIAGNOSIS — I2510 Atherosclerotic heart disease of native coronary artery without angina pectoris: Secondary | ICD-10-CM | POA: Insufficient documentation

## 2024-04-28 DIAGNOSIS — Z992 Dependence on renal dialysis: Secondary | ICD-10-CM | POA: Insufficient documentation

## 2024-04-28 DIAGNOSIS — I252 Old myocardial infarction: Secondary | ICD-10-CM | POA: Diagnosis not present

## 2024-04-28 DIAGNOSIS — N186 End stage renal disease: Secondary | ICD-10-CM | POA: Insufficient documentation

## 2024-04-28 DIAGNOSIS — E669 Obesity, unspecified: Secondary | ICD-10-CM | POA: Diagnosis not present

## 2024-04-28 DIAGNOSIS — F1490 Cocaine use, unspecified, uncomplicated: Secondary | ICD-10-CM | POA: Insufficient documentation

## 2024-04-28 DIAGNOSIS — Z794 Long term (current) use of insulin: Secondary | ICD-10-CM | POA: Diagnosis not present

## 2024-04-28 DIAGNOSIS — Z79899 Other long term (current) drug therapy: Secondary | ICD-10-CM | POA: Diagnosis not present

## 2024-04-28 DIAGNOSIS — J449 Chronic obstructive pulmonary disease, unspecified: Secondary | ICD-10-CM | POA: Insufficient documentation

## 2024-04-28 DIAGNOSIS — E1122 Type 2 diabetes mellitus with diabetic chronic kidney disease: Secondary | ICD-10-CM | POA: Diagnosis not present

## 2024-04-28 DIAGNOSIS — Z8673 Personal history of transient ischemic attack (TIA), and cerebral infarction without residual deficits: Secondary | ICD-10-CM | POA: Diagnosis not present

## 2024-04-28 DIAGNOSIS — R079 Chest pain, unspecified: Secondary | ICD-10-CM | POA: Insufficient documentation

## 2024-04-28 LAB — CBC
HCT: 34.5 % — ABNORMAL LOW (ref 39.0–52.0)
Hemoglobin: 10.6 g/dL — ABNORMAL LOW (ref 13.0–17.0)
MCH: 28 pg (ref 26.0–34.0)
MCHC: 30.7 g/dL (ref 30.0–36.0)
MCV: 91 fL (ref 80.0–100.0)
Platelets: 182 K/uL (ref 150–400)
RBC: 3.79 MIL/uL — ABNORMAL LOW (ref 4.22–5.81)
RDW: 14.6 % (ref 11.5–15.5)
WBC: 5.8 K/uL (ref 4.0–10.5)
nRBC: 0 % (ref 0.0–0.2)

## 2024-04-28 LAB — I-STAT VENOUS BLOOD GAS, ED
Acid-Base Excess: 4 mmol/L — ABNORMAL HIGH (ref 0.0–2.0)
Bicarbonate: 29.4 mmol/L — ABNORMAL HIGH (ref 20.0–28.0)
Calcium, Ion: 1.1 mmol/L — ABNORMAL LOW (ref 1.15–1.40)
HCT: 35 % — ABNORMAL LOW (ref 39.0–52.0)
Hemoglobin: 11.9 g/dL — ABNORMAL LOW (ref 13.0–17.0)
O2 Saturation: 95 %
Potassium: 3.6 mmol/L (ref 3.5–5.1)
Sodium: 135 mmol/L (ref 135–145)
TCO2: 31 mmol/L (ref 22–32)
pCO2, Ven: 48.5 mmHg (ref 44–60)
pH, Ven: 7.391 (ref 7.25–7.43)
pO2, Ven: 78 mmHg — ABNORMAL HIGH (ref 32–45)

## 2024-04-28 LAB — TROPONIN T, HIGH SENSITIVITY: Troponin T High Sensitivity: 244 ng/L (ref 0–19)

## 2024-04-28 LAB — BASIC METABOLIC PANEL WITH GFR
Anion gap: 16 — ABNORMAL HIGH (ref 5–15)
BUN: 38 mg/dL — ABNORMAL HIGH (ref 6–20)
CO2: 25 mmol/L (ref 22–32)
Calcium: 9.3 mg/dL (ref 8.9–10.3)
Chloride: 95 mmol/L — ABNORMAL LOW (ref 98–111)
Creatinine, Ser: 9.43 mg/dL — ABNORMAL HIGH (ref 0.61–1.24)
GFR, Estimated: 6 mL/min — ABNORMAL LOW
Glucose, Bld: 128 mg/dL — ABNORMAL HIGH (ref 70–99)
Potassium: 3.9 mmol/L (ref 3.5–5.1)
Sodium: 136 mmol/L (ref 135–145)

## 2024-04-28 LAB — I-STAT CHEM 8, ED
BUN: 40 mg/dL — ABNORMAL HIGH (ref 6–20)
Calcium, Ion: 1.1 mmol/L — ABNORMAL LOW (ref 1.15–1.40)
Chloride: 96 mmol/L — ABNORMAL LOW (ref 98–111)
Creatinine, Ser: 9.8 mg/dL — ABNORMAL HIGH (ref 0.61–1.24)
Glucose, Bld: 128 mg/dL — ABNORMAL HIGH (ref 70–99)
HCT: 35 % — ABNORMAL LOW (ref 39.0–52.0)
Hemoglobin: 11.9 g/dL — ABNORMAL LOW (ref 13.0–17.0)
Potassium: 3.6 mmol/L (ref 3.5–5.1)
Sodium: 135 mmol/L (ref 135–145)
TCO2: 27 mmol/L (ref 22–32)

## 2024-04-28 LAB — PROTIME-INR
INR: 1 (ref 0.8–1.2)
Prothrombin Time: 13.5 s (ref 11.4–15.2)

## 2024-04-28 LAB — AMMONIA: Ammonia: 23 umol/L (ref 9–35)

## 2024-04-28 LAB — PRO BRAIN NATRIURETIC PEPTIDE: Pro Brain Natriuretic Peptide: 7602 pg/mL — ABNORMAL HIGH

## 2024-04-28 LAB — ETHANOL: Alcohol, Ethyl (B): <15 mg/dL

## 2024-04-28 SURGERY — CORONARY/GRAFT ACUTE MI REVASCULARIZATION
Anesthesia: Moderate Sedation

## 2024-04-28 MED ORDER — NALOXONE HCL 0.4 MG/ML IJ SOLN
0.2000 mg | Freq: Once | INTRAMUSCULAR | Status: AC
  Administered 2024-04-28: 0.2 mg via INTRAVENOUS
  Filled 2024-04-28: qty 1

## 2024-04-28 MED ORDER — NALOXONE HCL 0.4 MG/ML IJ SOLN
0.4000 mg | Freq: Once | INTRAMUSCULAR | Status: AC
  Administered 2024-04-28: 0.4 mg via INTRAVENOUS
  Filled 2024-04-28: qty 1

## 2024-04-28 NOTE — ED Provider Notes (Signed)
 " [place] EMERGENCY DEPARTMENT AT [facility] Provider Note   CSN: [id number] Arrival date & time: 04/28/24  1009     Patient presents with: Code STEMI   Matthew Odom is a 53 y.o. male.   This is a 53 year old male presenting emergency department for chest pain.  Started abruptly today.  Also having some generalized weakness.  He has ESRD and last had dialysis yesterday.  Endorses some shortness of breath.  Symptoms started while he was lying down.  He is somnolent on exam, denies alcohol or drug use prior to arrival.        Prior to Admission medications  Medication Sig Start Date End Date Taking? Authorizing Provider  acetaminophen  (TYLENOL ) 500 MG tablet Take 500-1,000 mg by mouth every 6 (six) hours as needed (pain.).    [provider]  albuterol  (PROVENTIL ) (2.5 MG/3ML) 0.083% nebulizer solution Take 3 mLs (2.5 mg total) by nebulization every 6 (six) hours as needed for wheezing or shortness of breath. 02/02/24   Adrien Winfred Berke, MD  albuterol  (VENTOLIN  HFA) 108 [phone] Base) MCG/ACT inhaler Inhale 2 puffs into the lungs every 6 (six) hours as needed for wheezing or shortness of breath. 02/02/24   Adrien Winfred Berke, MD  amLODipine  (NORVASC ) 10 MG tablet Take 1 tablet (10 mg total) by mouth daily. 11/13/23   Jaycee Greig PARAS, NP  aspirin  EC 81 MG tablet Take one tablet by mouth twice daily for 30 days after sugery for blood clot prevention. Start post op day 1. Swallow whole. 11/13/23   Jaycee Greig PARAS, NP  atorvastatin  (LIPITOR) 80 MG tablet Take 1 tablet (80 mg total) by mouth daily. 11/13/23 03/18/24  Jaycee Greig PARAS, NP  budesonide -formoterol  (SYMBICORT ) 160-4.5 MCG/ACT inhaler Inhale 2 puffs into the lungs 2 (two) times daily. 02/02/24   Adrien Winfred Berke, MD  calcium  acetate (PHOSLO ) 667 MG capsule Take 2 capsules (1,334 mg total) by mouth 3 (three) times daily with meals 06/25/21     calcium  carbonate (TUMS) 500 MG chewable tablet Chew 1 tablet  (200 mg of elemental calcium  total) by mouth 3 (three) times daily with meals. 11/13/23   Jaycee Greig PARAS, NP  carvedilol  (COREG ) 25 MG tablet Take 1 tablet (25 mg total) by mouth 2 (two) times daily with a meal. 11/13/23   Jaycee Greig PARAS, NP  cloNIDine  (CATAPRES ) 0.1 MG tablet Take 1 tablet (0.1 mg total) by mouth in the morning AND 2 tablets (0.2 mg total) at bedtime. 04/05/24     doxazosin  (CARDURA ) 4 MG tablet Take 3 tablets (12 mg total) by mouth at bedtime. 11/13/23   Jaycee Greig PARAS, NP  doxazosin  (CARDURA ) 8 MG tablet Take 1 tablet (8 mg total) by mouth daily. 12/05/22   Barbaraann Darryle Ned, MD  doxazosin  (CARDURA ) 8 MG tablet Take 1 tablet by mouth every night at bedtime 04/19/24     hydrALAZINE  (APRESOLINE ) 100 MG tablet Take 1 tablet (100 mg total) by mouth 3 (three) times daily. 11/13/23   Jaycee Greig PARAS, NP  Insulin  Glargine (BASAGLAR  KWIKPEN) 100 UNIT/ML Inject 20 Units into the skin daily. 11/13/23   Jaycee Greig PARAS, NP  insulin  lispro (HUMALOG ) 100 UNIT/ML injection Inject 0-0.04 mLs (0-4 Units total) into the skin 3 (three) times daily as needed for high blood sugar. 11/13/23   Jaycee Greig PARAS, NP  Insulin  Pen Needle 31G X 6 MM MISC Use as directed in the morning, at noon, in the evening, and at bedtime. 11/13/23  Jaycee, Amy J, NP  liraglutide  (VICTOZA ) 18 MG/3ML SOPN Inject 1.8 mg into the skin daily. 11/13/23   Jaycee Greig PARAS, NP  nitroGLYCERIN  (NITROSTAT ) 0.4 MG SL tablet Place 1 tablet (0.4 mg total) under the tongue every 5 (five) minutes as needed for chest pain. 11/13/23   Jaycee Greig PARAS, NP  rOPINIRole  (REQUIP ) 0.25 MG tablet Take 1-2 tablets (0.25-0.5 mg total) by mouth at bedtime as needed for restless leg. 02/23/24     sertraline  (ZOLOFT ) 25 MG tablet Take 1 tablet (25 mg total) by mouth daily. 11/13/23   Jaycee Greig PARAS, NP  SPIRIVA  HANDIHALER 18 MCG CAPS Place into inhaler and inhale daily at 6 (six) AM. 02/10/24   [provider]  tiotropium (SPIRIVA ) 18 MCG inhalation capsule Place 1  capsule (18 mcg total) into inhaler and inhale daily. 02/02/24   Adrien Winfred Berke, MD    Allergies: Aspirin  and Nitroglycerin     Review of Systems  Updated Vital Signs BP (!) 136/91   Pulse 71   Temp (!) 97.2 F (36.2 C) (Oral)   Resp 13   SpO2 99%   Physical Exam Vitals and nursing note reviewed.  Constitutional:      Appearance: He is obese.  HENT:     Head: Normocephalic.     Nose: Nose normal.  Eyes:     Comments: Constricted pupil  Cardiovascular:     Rate and Rhythm: Normal rate and regular rhythm.  Pulmonary:     Effort: Pulmonary effort is normal.     Breath sounds: Normal breath sounds.  Abdominal:     General: Abdomen is flat. There is no distension.     Tenderness: There is no abdominal tenderness. There is no guarding or rebound.  Musculoskeletal:     Right lower leg: No edema.     Left lower leg: No edema.  Skin:    General: Skin is warm and dry.     Capillary Refill: Capillary refill takes less than 2 seconds.  Neurological:     General: No focal deficit present.     Mental Status: He is alert and oriented to person, place, and time.  Psychiatric:     Comments: Somnolent     (all labs ordered are listed, but only abnormal results are displayed) Labs Reviewed  CBC - Abnormal; Notable for the following components:      Result Value   RBC 3.79 (*)    Hemoglobin 10.6 (*)    HCT 34.5 (*)    All other components within normal limits  BASIC METABOLIC PANEL WITH GFR - Abnormal; Notable for the following components:   Chloride 95 (*)    Glucose, Bld 128 (*)    BUN 38 (*)    Creatinine, Ser 9.43 (*)    GFR, Estimated 6 (*)    Anion gap 16 (*)    All other components within normal limits  PRO BRAIN NATRIURETIC PEPTIDE - Abnormal; Notable for the following components:   Pro Brain Natriuretic Peptide 7,602.0 (*)    All other components within normal limits  I-STAT CHEM 8, ED - Abnormal; Notable for the following components:   Chloride 96 (*)     BUN 40 (*)    Creatinine, Ser 9.80 (*)    Glucose, Bld 128 (*)    Calcium , Ion 1.10 (*)    Hemoglobin 11.9 (*)    HCT 35.0 (*)    All other components within normal limits  I-STAT VENOUS BLOOD GAS, ED -  Abnormal; Notable for the following components:   pO2, Ven 78 (*)    Bicarbonate 29.4 (*)    Acid-Base Excess 4.0 (*)    Calcium , Ion 1.10 (*)    HCT 35.0 (*)    Hemoglobin 11.9 (*)    All other components within normal limits  TROPONIN T, HIGH SENSITIVITY - Abnormal; Notable for the following components:   Troponin T High Sensitivity 244 (*)    All other components within normal limits  ETHANOL  PROTIME-INR  AMMONIA    EKG: None  Radiology: DG Chest Portable 1 View Result Date: 04/28/2024 CLINICAL DATA:  Chest pain EXAM: PORTABLE CHEST 1 VIEW COMPARISON:  04/03/2024 FINDINGS: The cardio pericardial silhouette is enlarged. There is pulmonary vascular congestion without overt pulmonary edema. The lungs are clear without focal pneumonia, edema, pneumothorax or pleural effusion. No acute bony abnormality. Telemetry leads overlie the chest. IMPRESSION: Enlargement of the cardiopericardial silhouette with pulmonary vascular congestion. Electronically Signed   By: Camellia Candle M.D.   On: 04/28/2024 11:12     Procedures   Medications Ordered in the ED  naloxone  (NARCAN ) injection 0.2 mg (0.2 mg Intravenous Given 04/28/24 1035)  naloxone  (NARCAN ) injection 0.4 mg (0.4 mg Intravenous Given 04/28/24 1201)    Clinical Course as of 04/28/24 1549  Thu Apr 28, 2024  1125 DG Chest Portable 1 View IMPRESSION: Enlargement of the cardiopericardial silhouette with pulmonary vascular congestion.   [TY]  1245 Patient is alert and oriented x 3 states he is improved and feels back to his normal self.  No longer having chest pain.  He is requesting discharge at this time.  I shared my concern regarding his elevated troponin and that he could be having a heart attack.  He voiced  understanding and is still requesting discharge.  I outlined the risks to include permanent disability or death.  Patient sober and has capacity my clinical opinion.  As a next step encouraged him to follow-up with his primary doctor and cardiology.  He was supposed to have an appointment with them recently, states that he will reschedule.  If he changes his mind I did tell him that we would gladly evaluate him again here in the emergency department.  Patient will be leaving AGAINST MEDICAL ADVICE at this time. [TY]    Clinical Course User Index [TY] Neysa Caron PARAS, DO                                 Medical Decision Making Is a 53 year old male presenting emergency department for chest pain.  Has a history of obesity, diabetes, hypertension, cocaine use, NSTEMI, prior stroke COPD, CAD, thoracic aneurysm.  Activated as a STEMI in the field, canceled by cardiology as EKG similar to prior.  Troponin and BNP elevated, but similar to prior.  No significant metabolic derangements.  His BUN/creatinine consistent with his known ESRD.  He is anemic, but stable.  Alcohol level undetectable.  He is somnolent on exam, VBG does not appear to be retaining CO2.  pH is normal, unlikely DKA.  Did have constricted pupils, was given some Narcan  with some improvement.  Observed with improvement of his mentation back to baseline.  Patient elected to leave AGAINST MEDICAL ADVICE; see ED course.  Amount and/or Complexity of Data Reviewed Labs: ordered. Radiology: ordered. Decision-making details documented in ED Course. ECG/medicine tests: ordered. Discussion of management or test interpretation with external provider(s): Case discussed  with cardiology, no STEMI recommend continued cardiac workup.  Risk Prescription drug management. Decision regarding hospitalization. Diagnosis or treatment significantly limited by social determinants of health.       Final diagnoses:  None    ED Discharge Orders     None           Neysa Caron PARAS, DO 04/28/24 1549  "

## 2024-04-28 NOTE — Discharge Instructions (Signed)
 Please follow-up with your primary doctor and your cardiologist.  Return for fevers, chills, headache, chest pain, lethargy, uncontrolled nausea vomiting, difficulty breathing or any new or worsening symptoms that are concerning to you.

## 2024-04-28 NOTE — ED Triage Notes (Signed)
 Pt BIB [facility] from home for CP that began about 25 minutes ago. Pt complaining of weakness, nausea, no LOC, not diaphoretic. Per EMS V2-V4 elevation, BP in 80s systolic on EMS arrival, most recent BP 110/60. 324 ASA given by EMS. All other VSS.

## 2024-04-28 NOTE — Progress Notes (Signed)
" °   04/28/24 1030  Spiritual Encounters  Type of Visit Initial  Reason for visit Code  OnCall Visit Yes   Chaplain responded to Code STEMI. Upon arrival, medical team was working on Patient. No family was present. Per discussion with nurse, Code STEMI was being canceled and there was no spiritual need. Chaplain remains available upon request.  Chaplain Therisa Samuel  "

## 2024-04-29 ENCOUNTER — Other Ambulatory Visit: Payer: Self-pay

## 2024-05-04 ENCOUNTER — Ambulatory Visit (HOSPITAL_COMMUNITY)

## 2024-05-06 ENCOUNTER — Ambulatory Visit: Payer: Self-pay

## 2024-05-06 NOTE — Telephone Encounter (Signed)
 FYI Only or Action Required?: FYI only for provider: appointment scheduled on 1/12 for HFU.  Patient was last seen in primary care on 11/13/2023 by Jaycee Greig PARAS, NP.  Called Nurse Triage reporting Insomnia.  Symptoms began several days ago.  Interventions attempted: Nothing.  Symptoms are: stable.  Triage Disposition: Home Care  Patient/caregiver understands and will follow disposition?:    Copied from CRM [id number]. Topic: Clinical - Red Word Triage >> May 06, 2024 10:22 AM Selinda RAMAN wrote: Red Word that prompted transfer to Nurse Triage: The patient called in stating he has not slep in the last 3-4 days and it is driving him crazy. He had issues in the past but he would take Advil PM and it would help him. He was told in dialysis that he needed to stop taking it and he doesn't know what to do now. He would like to speak with a nurse. I will transfer him to [facility] Reason for Disposition  Difficulty falling to sleep or staying asleep  Answer Assessment - Initial Assessment Questions 1. DESCRIPTION: Tell me about your sleeping problem. (e.g., waking frequently during night, sleeping during day and awake at night, trouble falling asleep) How bad is it?      Falls asleep, wakes shortly after and unable to go back to sleep 2. ONSET: How long have you been having trouble sleeping? (e.g., days, weeks, months; longstanding sleep problems)     A weeks 3. DAYTIME SLEEP PATTERN: How much time do you spend sleeping or napping during the day?     No naps 4. STRESSORS: Is there anything that is making you feel stressed? Is there something that worries you?      5. PAIN: Do you have any pain that is keeping you awake? (e.g., back pain, joint pain) If Yes, ask How bad is the pain? (e.g., scale 0-10; mild, moderate, severe).      6. CAFFEINE : Do you drink caffeinated beverages? If Yes, ask How much each day? (e.g., coffee, tea, colas)      7. ALCOHOL USE OR SUBSTANCE USE: Do you  drink alcohol or use any substances?      8. MEDICINE CHANGE: Has there been any recent change in medicines? (e.g., new medicine started, stopped, or dose changed).      no 9. TREATMENT: What have you done so far to treat this sleep problem? (e.g., prescription or OTC sleep medicines, herbal or dietary supplements, cannabis, relaxation strategies)     Was insturcted to stop taking advil PM 10. OTHER SYMPTOMS: Do you have any other symptoms?  (e.g., difficulty breathing)       Anxiety  Protocols used: Insomnia-A-AH

## 2024-05-06 NOTE — Telephone Encounter (Signed)
"  Appt noted  "

## 2024-05-16 ENCOUNTER — Other Ambulatory Visit: Payer: Self-pay

## 2024-05-16 ENCOUNTER — Encounter: Payer: Self-pay | Admitting: Family

## 2024-05-16 NOTE — Progress Notes (Signed)
 Erroneous encounter-disregard

## 2024-05-17 ENCOUNTER — Emergency Department (HOSPITAL_COMMUNITY)
Admission: EM | Admit: 2024-05-17 | Discharge: 2024-05-18 | Disposition: A | Discharge status: Home / Self Care | Attending: Emergency Medicine | Admitting: Emergency Medicine

## 2024-05-17 ENCOUNTER — Other Ambulatory Visit: Payer: Self-pay

## 2024-05-17 ENCOUNTER — Emergency Department (HOSPITAL_COMMUNITY)

## 2024-05-17 ENCOUNTER — Encounter (HOSPITAL_COMMUNITY): Payer: Self-pay

## 2024-05-17 DIAGNOSIS — N186 End stage renal disease: Secondary | ICD-10-CM | POA: Insufficient documentation

## 2024-05-17 DIAGNOSIS — Z7982 Long term (current) use of aspirin: Secondary | ICD-10-CM | POA: Diagnosis not present

## 2024-05-17 DIAGNOSIS — E119 Type 2 diabetes mellitus without complications: Secondary | ICD-10-CM | POA: Insufficient documentation

## 2024-05-17 DIAGNOSIS — Z7951 Long term (current) use of inhaled steroids: Secondary | ICD-10-CM | POA: Insufficient documentation

## 2024-05-17 DIAGNOSIS — F1490 Cocaine use, unspecified, uncomplicated: Secondary | ICD-10-CM | POA: Diagnosis not present

## 2024-05-17 DIAGNOSIS — Z79899 Other long term (current) drug therapy: Secondary | ICD-10-CM | POA: Insufficient documentation

## 2024-05-17 DIAGNOSIS — R079 Chest pain, unspecified: Secondary | ICD-10-CM

## 2024-05-17 DIAGNOSIS — J449 Chronic obstructive pulmonary disease, unspecified: Secondary | ICD-10-CM | POA: Insufficient documentation

## 2024-05-17 DIAGNOSIS — I509 Heart failure, unspecified: Secondary | ICD-10-CM | POA: Insufficient documentation

## 2024-05-17 DIAGNOSIS — I132 Hypertensive heart and chronic kidney disease with heart failure and with stage 5 chronic kidney disease, or end stage renal disease: Secondary | ICD-10-CM | POA: Diagnosis not present

## 2024-05-17 DIAGNOSIS — Z992 Dependence on renal dialysis: Secondary | ICD-10-CM | POA: Diagnosis not present

## 2024-05-17 DIAGNOSIS — Z794 Long term (current) use of insulin: Secondary | ICD-10-CM | POA: Insufficient documentation

## 2024-05-17 LAB — CBC WITH DIFFERENTIAL/PLATELET
Abs Immature Granulocytes: 0.03 K/uL (ref 0.00–0.07)
Basophils Absolute: 0 K/uL (ref 0.0–0.1)
Basophils Relative: 1 %
Eosinophils Absolute: 0.1 K/uL (ref 0.0–0.5)
Eosinophils Relative: 1 %
HCT: 26.4 % — ABNORMAL LOW (ref 39.0–52.0)
Hemoglobin: 8.1 g/dL — ABNORMAL LOW (ref 13.0–17.0)
Immature Granulocytes: 0 %
Lymphocytes Relative: 13 %
Lymphs Abs: 1.2 K/uL (ref 0.7–4.0)
MCH: 27.5 pg (ref 26.0–34.0)
MCHC: 30.7 g/dL (ref 30.0–36.0)
MCV: 89.5 fL (ref 80.0–100.0)
Monocytes Absolute: 0.8 K/uL (ref 0.1–1.0)
Monocytes Relative: 9 %
Neutro Abs: 6.7 K/uL (ref 1.7–7.7)
Neutrophils Relative %: 76 %
Platelets: 283 K/uL (ref 150–400)
RBC: 2.95 MIL/uL — ABNORMAL LOW (ref 4.22–5.81)
RDW: 14.1 % (ref 11.5–15.5)
WBC: 8.8 K/uL (ref 4.0–10.5)
nRBC: 0 % (ref 0.0–0.2)

## 2024-05-17 LAB — BASIC METABOLIC PANEL WITH GFR
Anion gap: 16 — ABNORMAL HIGH (ref 5–15)
BUN: 61 mg/dL — ABNORMAL HIGH (ref 6–20)
CO2: 25 mmol/L (ref 22–32)
Calcium: 8.1 mg/dL — ABNORMAL LOW (ref 8.9–10.3)
Chloride: 100 mmol/L (ref 98–111)
Creatinine, Ser: 11.7 mg/dL — ABNORMAL HIGH (ref 0.61–1.24)
GFR, Estimated: 5 mL/min — ABNORMAL LOW
Glucose, Bld: 153 mg/dL — ABNORMAL HIGH (ref 70–99)
Potassium: 4.1 mmol/L (ref 3.5–5.1)
Sodium: 141 mmol/L (ref 135–145)

## 2024-05-17 LAB — URINE DRUG SCREEN
Amphetamines: NEGATIVE
Barbiturates: NEGATIVE
Benzodiazepines: NEGATIVE
Cocaine: POSITIVE — AB
Fentanyl: NEGATIVE
Methadone Scn, Ur: NEGATIVE
Opiates: NEGATIVE
Tetrahydrocannabinol: NEGATIVE

## 2024-05-17 LAB — CBC
HCT: 24.8 % — ABNORMAL LOW (ref 39.0–52.0)
Hemoglobin: 7.8 g/dL — ABNORMAL LOW (ref 13.0–17.0)
MCH: 27.9 pg (ref 26.0–34.0)
MCHC: 31.5 g/dL (ref 30.0–36.0)
MCV: 88.6 fL (ref 80.0–100.0)
Platelets: 279 K/uL (ref 150–400)
RBC: 2.8 MIL/uL — ABNORMAL LOW (ref 4.22–5.81)
RDW: 13.9 % (ref 11.5–15.5)
WBC: 7.8 K/uL (ref 4.0–10.5)
nRBC: 0 % (ref 0.0–0.2)

## 2024-05-17 LAB — HEPATITIS B SURFACE ANTIGEN: Hepatitis B Surface Ag: NONREACTIVE

## 2024-05-17 LAB — TROPONIN T, HIGH SENSITIVITY
Troponin T High Sensitivity: 226 ng/L (ref 0–19)
Troponin T High Sensitivity: 228 ng/L (ref 0–19)

## 2024-05-17 LAB — POC OCCULT BLOOD, ED: Fecal Occult Bld: NEGATIVE

## 2024-05-17 MED ORDER — FUROSEMIDE 10 MG/ML IJ SOLN
100.0000 mg | Freq: Once | INTRAVENOUS | Status: AC
  Administered 2024-05-17: 100 mg via INTRAVENOUS
  Filled 2024-05-17: qty 10

## 2024-05-17 MED ORDER — CHLORHEXIDINE GLUCONATE CLOTH 2 % EX PADS: 6.0000 | MEDICATED_PAD | Freq: Every day | CUTANEOUS | Status: DC

## 2024-05-17 MED ORDER — IPRATROPIUM-ALBUTEROL 0.5-2.5 (3) MG/3ML IN SOLN
3.0000 mL | Freq: Once | RESPIRATORY_TRACT | Status: AC
  Administered 2024-05-17: 3 mL via RESPIRATORY_TRACT
  Filled 2024-05-17: qty 3

## 2024-05-17 NOTE — ED Triage Notes (Signed)
 Pt arrived from home via [facility] c/o chest pain 7/10 described as squeezing. Denies any other symptoms. EMS adm 324mg  ASA and 1x SL nitroglycerin  enroute.

## 2024-05-17 NOTE — ED Notes (Signed)
 Date and time results received: 05/17/2024 0538 (use smartphrase .now to insert current time)  Test: troponin Critical Value: 226  Name of Provider Notified: L. Jarold, PA-C

## 2024-05-17 NOTE — Discharge Instructions (Signed)
 It was a pleasure taking care of you today.  Based on your history and physical exam I feel you are safe for discharge.  Today your chest pain improved while in the emergency department.  At this time I believe your chest pain was due to your recent cocaine use, please avoid the use of drugs in the future.  Please remain compliant with your outpatient dialysis and take all home medications as prescribed.  If you experience any of the following symptoms including but not limited to chest pain, shortness of breath, syncope, or other concerning symptom please return to the emergency department or seek further medical care.  Please follow-up with your primary care provider within the next week for follow-up, if symptoms worsen recommend follow-up within 48 hours. Please follow-up closely with your primary care provider and nephrology regarding your anemia, recommend re-check within 1 week. Please make them aware of work-up and all findings today.

## 2024-05-17 NOTE — ED Provider Notes (Signed)
 " [place] EMERGENCY DEPARTMENT AT [facility] Provider Note   CSN: [id number] Arrival date & time: 05/17/24  9580     Patient presents with: Chest Pain   Matthew Odom is a 54 y.o. male who presents to the emergency department for a chief complaint of chest pain.  Patient states that his chest pain started at roughly 4 AM this morning, patient does state that he used cocaine just prior to the chest pain starting.  Patient states that he had minimal improvement with the chest pain when given aspirin  as well as nitroglycerin  by EMS.  Patient states that the chest pain is worse with movement and seems to be positional.  When laying flat on his back patient denies chest pain however with movement the left side of his chest feels almost like it is cramping.  Patient denies shortness of breath.  Patient is in end-stage kidney disease on dialysis, Tuesday, Thursday, Saturday.  Patient was compliant with dialysis on Saturday however missed his dialysis appointment today since he was in the emergency department.  Patient denies abdominal pain, urinary symptoms, or hematochezia. Denies shortness of breath. Past medical history significant for end stage renal disease on dialysis, COPD, hypertension, hyperlipidemia, elevated troponin, congestive heart failure, nonischemic cardiomyopathy, type 2 diabetes, etc.    Chest Pain      Prior to Admission medications  Medication Sig Start Date End Date Taking? Authorizing Provider  acetaminophen  (TYLENOL ) 500 MG tablet Take 500-1,000 mg by mouth every 6 (six) hours as needed (pain.).    [provider]  albuterol  (PROVENTIL ) (2.5 MG/3ML) 0.083% nebulizer solution Take 3 mLs (2.5 mg total) by nebulization every 6 (six) hours as needed for wheezing or shortness of breath. 02/02/24   Adrien Winfred Berke, MD  albuterol  (VENTOLIN  HFA) 108 (90 Base) MCG/ACT inhaler Inhale 2 puffs into the lungs every 6 (six) hours as needed for wheezing or  shortness of breath. 02/02/24   Adrien Winfred Berke, MD  amLODipine  (NORVASC ) 10 MG tablet Take 1 tablet (10 mg total) by mouth daily. 11/13/23   Jaycee Greig PARAS, NP  aspirin  EC 81 MG tablet Take one tablet by mouth twice daily for 30 days after sugery for blood clot prevention. Start post op day 1. Swallow whole. 11/13/23   Jaycee Greig PARAS, NP  atorvastatin  (LIPITOR) 80 MG tablet Take 1 tablet (80 mg total) by mouth daily. 11/13/23 03/18/24  Jaycee Greig PARAS, NP  budesonide -formoterol  (SYMBICORT ) 160-4.5 MCG/ACT inhaler Inhale 2 puffs into the lungs 2 (two) times daily. 02/02/24   Adrien Winfred Berke, MD  calcium  acetate (PHOSLO ) 667 MG capsule Take 2 capsules (1,334 mg total) by mouth 3 (three) times daily with meals 06/25/21     calcium  carbonate (TUMS) 500 MG chewable tablet Chew 1 tablet (200 mg of elemental calcium  total) by mouth 3 (three) times daily with meals. 11/13/23   Jaycee Greig PARAS, NP  carvedilol  (COREG ) 25 MG tablet Take 1 tablet (25 mg total) by mouth 2 (two) times daily with a meal. 11/13/23   Jaycee Greig PARAS, NP  cloNIDine  (CATAPRES ) 0.1 MG tablet Take 1 tablet (0.1 mg total) by mouth in the morning AND 2 tablets (0.2 mg total) at bedtime. 04/05/24     doxazosin  (CARDURA ) 4 MG tablet Take 3 tablets (12 mg total) by mouth at bedtime. 11/13/23   Jaycee Greig PARAS, NP  doxazosin  (CARDURA ) 8 MG tablet Take 1 tablet (8 mg total) by mouth daily. 12/05/22   Barbaraann Kotyk  Debby, MD  doxazosin  (CARDURA ) 8 MG tablet Take 1 tablet by mouth every night at bedtime 04/19/24     hydrALAZINE  (APRESOLINE ) 100 MG tablet Take 1 tablet (100 mg total) by mouth 3 (three) times daily. 11/13/23   Jaycee Greig PARAS, NP  Insulin  Glargine (BASAGLAR  KWIKPEN) 100 UNIT/ML Inject 20 Units into the skin daily. 11/13/23   Jaycee Greig PARAS, NP  insulin  lispro (HUMALOG ) 100 UNIT/ML injection Inject 0-0.04 mLs (0-4 Units total) into the skin 3 (three) times daily as needed for high blood sugar. 11/13/23   Jaycee Greig PARAS, NP  Insulin  Pen  Needle 31G X 6 MM MISC Use as directed in the morning, at noon, in the evening, and at bedtime. 11/13/23   Jaycee Greig PARAS, NP  liraglutide  (VICTOZA ) 18 MG/3ML SOPN Inject 1.8 mg into the skin daily. 11/13/23   Jaycee Greig PARAS, NP  nitroGLYCERIN  (NITROSTAT ) 0.4 MG SL tablet Place 1 tablet (0.4 mg total) under the tongue every 5 (five) minutes as needed for chest pain. 11/13/23   Jaycee Greig PARAS, NP  rOPINIRole  (REQUIP ) 0.25 MG tablet Take 1-2 tablets (0.25-0.5 mg total) by mouth at bedtime as needed for restless leg. 02/23/24     sertraline  (ZOLOFT ) 25 MG tablet Take 1 tablet (25 mg total) by mouth daily. 11/13/23   Jaycee Greig PARAS, NP  SPIRIVA  HANDIHALER 18 MCG CAPS Place into inhaler and inhale daily at 6 (six) AM. 02/10/24   [provider]  tiotropium (SPIRIVA ) 18 MCG inhalation capsule Place 1 capsule (18 mcg total) into inhaler and inhale daily. 02/02/24   Adrien Winfred Berke, MD    Allergies: Aspirin  and Nitroglycerin     Review of Systems  Cardiovascular:  Positive for chest pain.    Updated Vital Signs BP (!) 203/137   Pulse 88   Temp 97.8 F (36.6 C) (Axillary)   Resp (!) 21   Ht 5' 11 (1.803 m)   Wt 111 kg   SpO2 99%   BMI 34.13 kg/m   Physical Exam Vitals and nursing note reviewed.  Constitutional:      General: He is awake. He is not in acute distress.    Appearance: He is well-developed. He is not toxic-appearing or diaphoretic.     Comments: Chronically ill-appearing  HENT:     Head: Normocephalic and atraumatic.  Eyes:     Extraocular Movements: Extraocular movements intact.     Pupils: Pupils are equal, round, and reactive to light.  Cardiovascular:     Rate and Rhythm: Normal rate and regular rhythm.  Pulmonary:     Effort: Pulmonary effort is normal.     Breath sounds: Normal breath sounds. No decreased breath sounds, wheezing, rhonchi or rales.  Chest:     Chest wall: No tenderness.     Comments: Chest pain reproducible with patient movement including  sitting up in bed  Abdominal:     Palpations: Abdomen is soft.     Tenderness: There is no abdominal tenderness.  Musculoskeletal:        General: Normal range of motion.     Right lower leg: No tenderness.     Left lower leg: No tenderness.  Skin:    General: Skin is warm.     Capillary Refill: Capillary refill takes less than 2 seconds.  Neurological:     General: No focal deficit present.     Mental Status: He is alert and oriented to person, place, and time.  Psychiatric:  Mood and Affect: Mood normal.        Behavior: Behavior normal. Behavior is cooperative.    (all labs ordered are listed, but only abnormal results are displayed) Labs Reviewed  BASIC METABOLIC PANEL WITH GFR - Abnormal; Notable for the following components:      Result Value   Glucose, Bld 153 (*)    BUN 61 (*)    Creatinine, Ser 11.70 (*)    Calcium  8.1 (*)    GFR, Estimated 5 (*)    Anion gap 16 (*)    All other components within normal limits  CBC - Abnormal; Notable for the following components:   RBC 2.80 (*)    Hemoglobin 7.8 (*)    HCT 24.8 (*)    All other components within normal limits  URINE DRUG SCREEN - Abnormal; Notable for the following components:   Cocaine POSITIVE (*)    All other components within normal limits  CBC WITH DIFFERENTIAL/PLATELET - Abnormal; Notable for the following components:   RBC 2.95 (*)    Hemoglobin 8.1 (*)    HCT 26.4 (*)    All other components within normal limits  TROPONIN T, HIGH SENSITIVITY - Abnormal; Notable for the following components:   Troponin T High Sensitivity 226 (*)    All other components within normal limits  TROPONIN T, HIGH SENSITIVITY - Abnormal; Notable for the following components:   Troponin T High Sensitivity 228 (*)    All other components within normal limits  HEPATITIS B SURFACE ANTIGEN  HEPATITIS B SURFACE ANTIBODY, QUANTITATIVE  PRO BRAIN NATRIURETIC PEPTIDE  POC OCCULT BLOOD, ED    EKG: None  *EKG does show  not STEMI by my interpretation, or other acute ischemic changes*  Radiology: DG Chest 2 View Result Date: 05/17/2024 EXAM: 2 VIEW(S) XRAY OF THE CHEST 05/17/2024 04:49:00 AM COMPARISON: 04/28/2024 CLINICAL HISTORY: chest pain FINDINGS: LUNGS AND PLEURA: Mild perihilar and bibasilar interstitial opacities, favor to represent interstitial edema. Trace bilateral pleural effusions. No pneumothorax. HEART AND MEDIASTINUM: Cardiomegaly. No acute abnormality of the mediastinal silhouette. BONES AND SOFT TISSUES: No acute osseous abnormality. IMPRESSION: 1. Cardiac enlargement, trace bilateral pleural effusions, and mild interstitial edema compatible with mild congestive heart failure. Electronically signed by: Waddell Calk MD MD 05/17/2024 05:08 AM EST RP Workstation: [id number]     Procedures   Medications Ordered in the ED  Chlorhexidine  Gluconate Cloth 2 % PADS 6 each (has no administration in time range)    Clinical Course as of 05/17/24 1648  Tue May 17, 2024  0927 Spoke with Dr. Dennise with nephrology who will coordinate dialysis for the patient and also consult regarding drop in hemoglobin [CH]    Clinical Course User Index [CH] Tarez Bowns, Terrall FALCON, PA-C                                 Medical Decision Making Amount and/or Complexity of Data Reviewed Labs: ordered. Radiology: ordered.   Patient presents to the ED for concern of chest pain, this involves an extensive number of treatment options, and is a complaint that carries with it a high risk of complications and morbidity.  The differential diagnosis includes cocaine use, heart failure exacerbation, ACS, fluid overload, pneumothorax, other drug use, etc.   Co morbidities that complicate the patient evaluation  End stage renal disease on dialysis, COPD, hypertension, hyperlipidemia, elevated troponin, congestive heart failure, nonischemic cardiomyopathy, type 2 diabetes  Additional history obtained:  Reviewed most recent ED  note from 04/28/2024 where patient was seen for chest pain, initially activated as a code STEMI in the field however after talking with cardiology this was discontinued, patient was actually given Narcan  with improvement in symptoms, patient states that chest pain is different   Lab Tests:  I Ordered, and personally interpreted labs.  The pertinent results include: CBC remarkable for hemoglobin of 7.8 (which is decreased from 11.9 two weeks ago, BMP significant for glucose of 153, BUN 61, creatinine 11.7, calcium  8.1, anion gap 16, Hemoccult negative, troponins 226 and 228   Imaging Studies ordered:  I ordered imaging studies including chest x-ray I independently visualized and interpreted imaging which showed cardiac enlargement, trace bilateral pleural effusions, mild residual edema compatible with mild congestive heart failure I agree with the radiologist interpretation   Cardiac Monitoring:  The patient was maintained on a cardiac monitor.  I personally viewed and interpreted the cardiac monitored which showed an underlying rhythm of: Sinus rhythm   Medicines ordered and prescription drug management:  I have reviewed the patients home medicines and have made adjustments as needed   Test Considered:  CT PE study: deferred at this time as patient is not tachycardic and denies shortness of breath, chest pain is purely positional and eventually resolved throughout ED stay   Critical Interventions:  none   Consultations Obtained:  I requested consultation with the nephrology team,  and discussed lab and imaging findings as well as pertinent plan - they recommend: dialysis from the ED and if patient remains stable, discharge. I discussed with Dr. Dennise the patient's anemia, states that he would not expect that much of a drop from purely dilution due to fluid overload recommends repeat CBC and if hemoglobin is stable may follow-up outpatient   Problem List / ED  Course:  54 year old male, vital signs stable, presents to the emergency department chief complaint of chest pain, patient is an end-stage kidney disease on dialysis, still does make urine, was supposed to have dialysis this morning but came to ED so missed it  Labs ordered from triage shows significant anemia, 2 weeks ago hemoglobin was noted to be 11.9, today it is 7.8, patient denies blood in stool, could be due to fluid overload however doubt such a significant drop, BMP significant for end-stage renal disease changes, troponins flat however elevated but this appears chronic, Hemoccult negative Chest pain is reproducible with position change, denies chest pain when lying flat, states that it is worse when sitting up, denies shortness of breath Chest X-ray shows findings consistent with congestive heart failure Given EKG and flat troponins, as well as reproducible nature of chest pain with movement, doubt ACS, suspect that chest pain may be due to cocaine use or fluid overload due to missed dialysis this morning, I do believe patient would benefit from dialysis here in the emergency department, will consult nephrology regarding dialysis as well as worsening anemia Considered PE however patient has had no calf redness, swelling, or pain, (calves are non-tender today), patient also denies shortness of breath and is not tachycardic, chest pain is positional and reproducible with patient movement, chest pain resolved throughout ED course stay Due to missed dialysis session consulted Dr. Dennise with nephrology and discussed case, patient will have dialysis from the ED and then likely discharge as long as he remains stable, hemoglobin is stable for close outpatient follow-up On reassessment patient much improved, denies chest pain even when sitting up and  with movement,  Patient signed out to oncoming provider Lynwood Roosevelt who assumes care over this patient, further work-up, and ultimately disposition.  Currently as patient is asymptomatic plan is for discharge after dialysis as long as patient remains stable and symptom free Most likely diagnosis at this time is chest pain most likely due to cocaine use, trops are elevated chronically and flat, chest pain resolved while in the ED   Reevaluation:  After the interventions noted above, I reevaluated the patient and found that they have :improved   Social Determinants of Health:  ESRD patient on dialysis   Dispostion:  Patient signed out to oncoming provider Lynwood Roosevelt who assumes care over this patient, further work-up, and ultimately disposition. Currently as patient is asymptomatic plan is for discharge after dialysis as long as patient remains stable and symptom free      Final diagnoses:  Chest pain, unspecified type  Chronic kidney disease on chronic dialysis [facility])  Cocaine use    ED Discharge Orders     None          Janetta Terrall FALCON, [place] 05/17/24 1649    Franklyn Sid SAILOR, MD 05/22/24 1427    Franklyn Sid SAILOR, MD 05/22/24 1427  "

## 2024-05-17 NOTE — ED Notes (Signed)
 Pt given a bag lunch and apple juice. RN aware.

## 2024-05-17 NOTE — Progress Notes (Signed)
 Mr. Lechuga presents to the ED c/o CP and SOB. His last HD was on 05/14/24 but he frequently signs off. He reports signing off early is 2nd to his work schedule and child care obligations. Seen and examined patient at bedside in the ED. Breathing is mildly labored but not in acute respiratory distress. On O2. Recent CXR shows trace bilateral pleural effusions, and mild interstitial edema compatible with mild congestive heart failure. Plan to resume HD here per his routine schedule. Discussed with the ED team. Per ED, may discharge Mr. Loreli after HD today if he's medically stable (he now reports his CP has resolved). Will do a formal consult if Mr. Dacus transitions to inpatient.  HD Orders: [facility]-TTS 4.5hrs BFR 400 DFR Auto 1.5 2K/3Ca EDW 107.1kg Heparin  5000 unit bolus L AVG  [name], NP [employer]

## 2024-05-17 NOTE — Progress Notes (Signed)
 Pt receives out-pt HD at [facility], TTS, 0710am chair time. Will continue to assist as needed.    Lavanda Kierah Goatley  Dialysis Navigator [phone]

## 2024-05-17 NOTE — ED Provider Notes (Signed)
 Signout received from [employer], PA-C.  In short patient is a 54 year old male with history of ESRD on HD, nonischemic cardiomyopathy, CHF, hypertension, hyperlipidemia, COPD who presents with complaints of chest pain that started in the setting of cocaine use this morning.  Recently missed dialysis.  At time of signout workup is notable for creatinine of 11.7, potassium 4.1, anion gap of 16, hemoglobin of 7.8 with a baseline of 11, Hemoccult negative troponin at baseline of 228.  Patient's chest pain has resolved.  Case discussed with nephrology will go for dialysis will be reevaluated upon return to the ED. Physical Exam  BP (!) 180/88 (BP Location: Right Arm)   Pulse 83   Temp 98.5 F (36.9 C) (Oral)   Resp 14   Ht 5' 11 (1.803 m)   Wt 111 kg   SpO2 98%   BMI 34.13 kg/m   Physical Exam Vitals and nursing note reviewed.  Constitutional:      General: He is not in acute distress.    Appearance: He is well-developed.  HENT:     Head: Normocephalic and atraumatic.  Eyes:     Conjunctiva/sclera: Conjunctivae normal.  Cardiovascular:     Rate and Rhythm: Normal rate and regular rhythm.     Heart sounds: No murmur heard. Pulmonary:     Comments: On baseline 2 L of oxygen, increased work of breathing, diminished right lower lobe sounds Abdominal:     Palpations: Abdomen is soft.     Tenderness: There is no abdominal tenderness.  Musculoskeletal:        General: No swelling.     Cervical back: Neck supple.  Skin:    General: Skin is warm and dry.     Capillary Refill: Capillary refill takes less than 2 seconds.  Neurological:     Mental Status: He is alert.  Psychiatric:        Mood and Affect: Mood normal.     Procedures  Procedures  ED Course / MDM   Clinical Course as of 05/21/24 1710  Tue May 17, 2024  0927 Spoke with Dr. Dennise with nephrology who will coordinate dialysis for the patient and also consult regarding drop in hemoglobin [CH]  1704 Notified by  nursing of increased work of breathing and worsening hypertension with pressures of 203/137.  Discussed patient with Dr. Dennise.  Recommended trial of 100 mg Lasix  IV as dialysis may be delayed.  Patient also has a history of COPD will trial DuoNeb as well [JT]  1959 Will require dialysis.  Return to the emergency room following for reevaluation before discharge [JT]    Clinical Course User Index [CH] Hinnant, Terrall FALCON, PA-C [JT] Donnajean Lynwood DEL, PA-C   Medical Decision Making Amount and/or Complexity of Data Reviewed Labs: ordered. Radiology: ordered.  Risk Prescription drug management.   Disposition pending return from dialysis.        Donnajean Lynwood DEL, PA-C 05/21/24 1710    Tegeler, Lonni PARAS, MD 05/24/24 [phone]

## 2024-05-17 NOTE — ED Notes (Signed)
 PT is endorsing SOB despite 02 sat being 100%.  Placed on 2L [place] for comfort

## 2024-05-17 NOTE — ED Notes (Signed)
 Vitals were attempted to be updated, pt refused vitals and stated he was upset about waiting so long for dialysis. RN notified.

## 2024-05-18 LAB — HEPATITIS B SURFACE ANTIBODY, QUANTITATIVE: Hep B S AB Quant (Post): 44.3 m[IU]/mL

## 2024-05-18 NOTE — Progress Notes (Addendum)
 Late note entry 1/14 0907am  D/c orders noted. Contacted out-pt HD clinic [facility] and informed of pt d/c and anticipated arrival back to clinic on thusrday. No further support is needed.     Lavanda Jahnessa Vanduyn Dialysis Navigator [phone]

## 2024-05-18 NOTE — Progress Notes (Signed)
" °   05/18/24 0040  Vitals  Temp 97.9 F (36.6 C)  Temp Source Axillary  BP (!) 193/105  Pulse Rate 84  ECG Heart Rate 84  Oxygen Therapy  SpO2 99 %  During Treatment Monitoring  Blood Flow Rate (mL/min) 400 mL/min  Arterial Pressure (mmHg) -231.71 mmHg  Venous Pressure (mmHg) 222.82 mmHg  TMP (mmHg) 21.21 mmHg  Ultrafiltration Rate (mL/min) 1064 mL/min  Dialysate Flow Rate (mL/min) 299 ml/min  Dialysate Potassium Concentration 3  Dialysate Calcium  Concentration 2.5  Duration of HD Treatment -hour(s) 3.5 hour(s)  Cumulative Fluid Removed (mL) per Treatment  3000.17  HD Safety Checks Performed Yes  Intra-Hemodialysis Comments Tx completed  Post Treatment  Dialyzer Clearance Lightly streaked  Liters Processed 84  Fluid Removed (mL) 3000 mL  Tolerated HD Treatment Yes  AVG/AVF Arterial Site Held (minutes) 10 minutes  AVG/AVF Venous Site Held (minutes) 10 minutes  Fistula / Graft Left Forearm Arteriovenous vein graft  Placement Date/Time: 02/10/24 1433   Orientation: Left  Access Location: Forearm  Access Type: Arteriovenous vein graft  Site Condition No complications  Fistula / Graft Assessment Present;Thrill;Bruit  Status Deaccessed  Needle Size 15  Drainage Description None    "

## 2024-05-18 NOTE — Discharge Planning (Signed)
 [facility] Patient Discharge Orders- [facility] CLINIC: [facility]  *He was not admitted. Received HD here then discharged after treatment*  Patient's name: Matthew Odom Admit/DC Dates: 05/17/2024 - 05/18/2024  Discharge Diagnoses: SOB/CP/Volume overload - resolved with HD   Aranesp: Given: No     Last Hgb: 8.1 PRBC's Given: No  ESA dose for discharge: Continue mircera 30 mcg IV q 2 weeks  IV Iron dose at discharge: Continue weekly Venofer   Heparin  change: No  EDW Change: No, push UF as tolerated   Bath Change: No  Access intervention/Change: No  Calcitriol  change: No  Discharge Labs: Calcium  8.1  K+ 4.1  IV Antibiotics: No  On Coumadin?: No     D/C Meds to be reconciled by nurse after every discharge.  Completed By: [name], NP   Reviewed by: MD:______ RN_______

## 2024-05-18 NOTE — ED Provider Notes (Signed)
 Patient was ESRD on hemodialysis brought in for shortness of breath.  Patient has returned from dialysis and is at his baseline, breathing comfortably on room air.  Discussed cocaine cessation.  He was provided resources for substance use counseling.  Discussed following up with his dialysis center.  Return precautions discussed.   Griselda Norris, MD 05/18/24 [phone]

## 2024-05-25 ENCOUNTER — Other Ambulatory Visit: Payer: Self-pay

## 2024-06-08 ENCOUNTER — Ambulatory Visit (HOSPITAL_COMMUNITY)

## 2024-06-08 ENCOUNTER — Telehealth (HOSPITAL_COMMUNITY): Payer: Self-pay | Admitting: Cardiovascular Disease

## 2024-06-08 NOTE — Telephone Encounter (Signed)
 06/08/24 NO SHOWED ECHOCARDIOGRAM- We will not reach out to patient to reschedule due to NO SHOW RATE 26%. If patient calls back we will reschedule up to 3 No Showed appts. [facility]

## 2024-06-10 ENCOUNTER — Encounter (HOSPITAL_COMMUNITY): Payer: Self-pay | Admitting: Vascular Surgery

## 2024-07-06 ENCOUNTER — Institutional Professional Consult (permissible substitution) (HOSPITAL_BASED_OUTPATIENT_CLINIC_OR_DEPARTMENT_OTHER): Admitting: Cardiovascular Disease

## 2024-07-19 ENCOUNTER — Ambulatory Visit: Admitting: Physician Assistant

## 2024-10-06 ENCOUNTER — Ambulatory Visit
